# Patient Record
Sex: Female | Born: 1965 | Race: White | Hispanic: No | Marital: Single | Smoking: Former smoker
Health system: Southern US, Community
[De-identification: ages and names within clinical notes are randomized; demographics above are authoritative.]

## PROBLEM LIST (undated history)

## (undated) DIAGNOSIS — K603 Anal fistula, unspecified: Secondary | ICD-10-CM

## (undated) DIAGNOSIS — N200 Calculus of kidney: Secondary | ICD-10-CM

## (undated) DIAGNOSIS — F32A Depression, unspecified: Secondary | ICD-10-CM

## (undated) DIAGNOSIS — S0300XA Dislocation of jaw, unspecified side, initial encounter: Secondary | ICD-10-CM

## (undated) DIAGNOSIS — F329 Major depressive disorder, single episode, unspecified: Secondary | ICD-10-CM

## (undated) DIAGNOSIS — R760 Raised antibody titer: Secondary | ICD-10-CM

## (undated) DIAGNOSIS — M542 Cervicalgia: Secondary | ICD-10-CM

## (undated) DIAGNOSIS — G43909 Migraine, unspecified, not intractable, without status migrainosus: Secondary | ICD-10-CM

## (undated) DIAGNOSIS — I10 Essential (primary) hypertension: Secondary | ICD-10-CM

## (undated) DIAGNOSIS — Z973 Presence of spectacles and contact lenses: Secondary | ICD-10-CM

## (undated) DIAGNOSIS — Z87442 Personal history of urinary calculi: Secondary | ICD-10-CM

## (undated) DIAGNOSIS — G4733 Obstructive sleep apnea (adult) (pediatric): Secondary | ICD-10-CM

## (undated) DIAGNOSIS — I2510 Atherosclerotic heart disease of native coronary artery without angina pectoris: Secondary | ICD-10-CM

## (undated) DIAGNOSIS — D649 Anemia, unspecified: Secondary | ICD-10-CM

## (undated) DIAGNOSIS — E785 Hyperlipidemia, unspecified: Secondary | ICD-10-CM

## (undated) DIAGNOSIS — Z8619 Personal history of other infectious and parasitic diseases: Secondary | ICD-10-CM

## (undated) DIAGNOSIS — G4730 Sleep apnea, unspecified: Secondary | ICD-10-CM

## (undated) DIAGNOSIS — B009 Herpesviral infection, unspecified: Secondary | ICD-10-CM

## (undated) HISTORY — DX: Raised antibody titer: R76.0

## (undated) HISTORY — DX: Migraine, unspecified, not intractable, without status migrainosus: G43.909

## (undated) HISTORY — DX: Essential (primary) hypertension: I10

## (undated) HISTORY — DX: Herpesviral infection, unspecified: B00.9

## (undated) HISTORY — DX: Sleep apnea, unspecified: G47.30

## (undated) HISTORY — DX: Dislocation of jaw, unspecified side, initial encounter: S03.00XA

## (undated) HISTORY — PX: SKIN CANCER DESTRUCTION: SHX778

## (undated) HISTORY — DX: Hyperlipidemia, unspecified: E78.5

## (undated) HISTORY — PX: INTRAUTERINE DEVICE (IUD) INSERTION: SHX5877

## (undated) HISTORY — DX: Atherosclerotic heart disease of native coronary artery without angina pectoris: I25.10

## (undated) HISTORY — PX: ANTERIOR CERVICAL DECOMP/DISCECTOMY FUSION: SHX1161

---
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
## 1998-01-06 ENCOUNTER — Other Ambulatory Visit: Admission: RE | Admit: 1998-01-06 | Discharge: 1998-01-06 | Payer: Self-pay | Admitting: Obstetrics and Gynecology

## 1998-02-14 ENCOUNTER — Inpatient Hospital Stay (HOSPITAL_COMMUNITY): Admission: EM | Admit: 1998-02-14 | Discharge: 1998-02-15 | Payer: Self-pay | Admitting: Emergency Medicine

## 1998-04-10 ENCOUNTER — Other Ambulatory Visit: Admission: RE | Admit: 1998-04-10 | Discharge: 1998-04-10 | Payer: Self-pay | Admitting: Gynecology

## 1998-06-17 ENCOUNTER — Encounter: Admission: RE | Admit: 1998-06-17 | Discharge: 1998-09-15 | Payer: Self-pay | Admitting: Gynecology

## 1998-07-21 ENCOUNTER — Encounter: Payer: Self-pay | Admitting: *Deleted

## 1998-07-21 ENCOUNTER — Ambulatory Visit (HOSPITAL_COMMUNITY): Admission: RE | Admit: 1998-07-21 | Discharge: 1998-07-21 | Payer: Self-pay | Admitting: Obstetrics and Gynecology

## 1998-12-01 ENCOUNTER — Inpatient Hospital Stay (HOSPITAL_COMMUNITY): Admission: AD | Admit: 1998-12-01 | Discharge: 1998-12-05 | Payer: Self-pay | Admitting: Gynecology

## 1998-12-21 ENCOUNTER — Encounter (HOSPITAL_COMMUNITY): Admission: RE | Admit: 1998-12-21 | Discharge: 1999-03-21 | Payer: Self-pay | Admitting: Gynecology

## 1999-01-14 ENCOUNTER — Other Ambulatory Visit: Admission: RE | Admit: 1999-01-14 | Discharge: 1999-01-14 | Payer: Self-pay | Admitting: Gynecology

## 2000-06-05 ENCOUNTER — Other Ambulatory Visit: Admission: RE | Admit: 2000-06-05 | Discharge: 2000-06-05 | Payer: Self-pay | Admitting: Gynecology

## 2000-07-11 ENCOUNTER — Other Ambulatory Visit: Admission: RE | Admit: 2000-07-11 | Discharge: 2000-07-11 | Payer: Self-pay | Admitting: Gynecology

## 2000-07-11 ENCOUNTER — Encounter (INDEPENDENT_AMBULATORY_CARE_PROVIDER_SITE_OTHER): Payer: Self-pay | Admitting: Specialist

## 2001-06-12 ENCOUNTER — Other Ambulatory Visit: Admission: RE | Admit: 2001-06-12 | Discharge: 2001-06-12 | Payer: Self-pay | Admitting: Gynecology

## 2003-02-05 ENCOUNTER — Encounter: Payer: Self-pay | Admitting: Gastroenterology

## 2003-02-05 ENCOUNTER — Encounter: Admission: RE | Admit: 2003-02-05 | Discharge: 2003-02-05 | Payer: Self-pay | Admitting: Gastroenterology

## 2003-03-06 ENCOUNTER — Encounter: Admission: RE | Admit: 2003-03-06 | Discharge: 2003-03-06 | Payer: Self-pay | Admitting: Gastroenterology

## 2003-03-06 ENCOUNTER — Encounter: Payer: Self-pay | Admitting: Gastroenterology

## 2003-05-08 ENCOUNTER — Other Ambulatory Visit: Admission: RE | Admit: 2003-05-08 | Discharge: 2003-05-08 | Payer: Self-pay | Admitting: Gynecology

## 2004-05-14 ENCOUNTER — Other Ambulatory Visit: Admission: RE | Admit: 2004-05-14 | Discharge: 2004-05-14 | Payer: Self-pay | Admitting: Gynecology

## 2004-09-25 ENCOUNTER — Emergency Department (HOSPITAL_COMMUNITY): Admission: EM | Admit: 2004-09-25 | Discharge: 2004-09-26 | Payer: Self-pay | Admitting: Emergency Medicine

## 2004-11-30 ENCOUNTER — Inpatient Hospital Stay (HOSPITAL_COMMUNITY): Admission: RE | Admit: 2004-11-30 | Discharge: 2004-12-03 | Payer: Self-pay | Admitting: Gynecology

## 2004-11-30 ENCOUNTER — Encounter (INDEPENDENT_AMBULATORY_CARE_PROVIDER_SITE_OTHER): Payer: Self-pay | Admitting: Specialist

## 2004-12-08 ENCOUNTER — Inpatient Hospital Stay (HOSPITAL_COMMUNITY): Admission: AD | Admit: 2004-12-08 | Discharge: 2004-12-08 | Payer: Self-pay | Admitting: Gynecology

## 2004-12-14 ENCOUNTER — Encounter: Admission: RE | Admit: 2004-12-14 | Discharge: 2005-01-13 | Payer: Self-pay | Admitting: Gynecology

## 2005-01-07 ENCOUNTER — Other Ambulatory Visit: Admission: RE | Admit: 2005-01-07 | Discharge: 2005-01-07 | Payer: Self-pay | Admitting: Gynecology

## 2006-01-28 ENCOUNTER — Emergency Department (HOSPITAL_COMMUNITY): Admission: EM | Admit: 2006-01-28 | Discharge: 2006-01-28 | Payer: Self-pay | Admitting: Family Medicine

## 2006-02-02 ENCOUNTER — Ambulatory Visit: Payer: Self-pay | Admitting: Family Medicine

## 2006-03-03 ENCOUNTER — Ambulatory Visit: Payer: Self-pay | Admitting: Family Medicine

## 2006-03-10 ENCOUNTER — Ambulatory Visit: Payer: Self-pay | Admitting: Family Medicine

## 2006-03-10 ENCOUNTER — Other Ambulatory Visit: Admission: RE | Admit: 2006-03-10 | Discharge: 2006-03-10 | Payer: Self-pay | Admitting: Family Medicine

## 2006-05-05 ENCOUNTER — Ambulatory Visit: Payer: Self-pay | Admitting: Family Medicine

## 2006-05-08 ENCOUNTER — Ambulatory Visit: Payer: Self-pay | Admitting: Family Medicine

## 2006-07-03 ENCOUNTER — Ambulatory Visit: Payer: Self-pay | Admitting: Family Medicine

## 2006-08-23 ENCOUNTER — Ambulatory Visit: Payer: Self-pay | Admitting: Family Medicine

## 2006-10-12 ENCOUNTER — Ambulatory Visit: Payer: Self-pay | Admitting: Family Medicine

## 2006-10-26 ENCOUNTER — Ambulatory Visit: Payer: Self-pay | Admitting: Family Medicine

## 2006-12-19 ENCOUNTER — Ambulatory Visit: Payer: Self-pay | Admitting: Family Medicine

## 2006-12-25 ENCOUNTER — Emergency Department (HOSPITAL_COMMUNITY): Admission: EM | Admit: 2006-12-25 | Discharge: 2006-12-25 | Payer: Self-pay | Admitting: Emergency Medicine

## 2007-01-01 ENCOUNTER — Ambulatory Visit: Payer: Self-pay | Admitting: Family Medicine

## 2007-03-01 ENCOUNTER — Ambulatory Visit: Payer: Self-pay | Admitting: Family Medicine

## 2007-03-28 ENCOUNTER — Ambulatory Visit: Payer: Self-pay | Admitting: Family Medicine

## 2007-03-28 ENCOUNTER — Other Ambulatory Visit: Admission: RE | Admit: 2007-03-28 | Discharge: 2007-03-28 | Payer: Self-pay | Admitting: Family Medicine

## 2007-04-12 ENCOUNTER — Ambulatory Visit: Payer: Self-pay | Admitting: Family Medicine

## 2007-06-05 ENCOUNTER — Ambulatory Visit: Payer: Self-pay | Admitting: Family Medicine

## 2007-07-30 ENCOUNTER — Ambulatory Visit: Payer: Self-pay | Admitting: Family Medicine

## 2007-08-20 ENCOUNTER — Ambulatory Visit: Payer: Self-pay | Admitting: Family Medicine

## 2007-09-17 ENCOUNTER — Ambulatory Visit: Payer: Self-pay | Admitting: Family Medicine

## 2007-09-28 ENCOUNTER — Ambulatory Visit: Payer: Self-pay | Admitting: Family Medicine

## 2007-11-20 ENCOUNTER — Ambulatory Visit: Payer: Self-pay | Admitting: Family Medicine

## 2008-01-01 ENCOUNTER — Encounter: Payer: Self-pay | Admitting: Endocrinology

## 2008-01-01 ENCOUNTER — Ambulatory Visit: Payer: Self-pay | Admitting: Family Medicine

## 2008-01-07 ENCOUNTER — Encounter: Payer: Self-pay | Admitting: Endocrinology

## 2008-01-07 ENCOUNTER — Encounter: Admission: RE | Admit: 2008-01-07 | Discharge: 2008-01-07 | Payer: Self-pay | Admitting: Family Medicine

## 2008-01-29 ENCOUNTER — Encounter: Admission: RE | Admit: 2008-01-29 | Discharge: 2008-01-29 | Payer: Self-pay | Admitting: Gastroenterology

## 2008-02-05 ENCOUNTER — Ambulatory Visit: Payer: Self-pay | Admitting: Endocrinology

## 2008-02-05 DIAGNOSIS — K219 Gastro-esophageal reflux disease without esophagitis: Secondary | ICD-10-CM | POA: Insufficient documentation

## 2008-02-05 DIAGNOSIS — E041 Nontoxic single thyroid nodule: Secondary | ICD-10-CM | POA: Insufficient documentation

## 2008-02-05 DIAGNOSIS — M542 Cervicalgia: Secondary | ICD-10-CM | POA: Insufficient documentation

## 2008-02-14 ENCOUNTER — Ambulatory Visit: Payer: Self-pay | Admitting: Family Medicine

## 2008-04-09 ENCOUNTER — Ambulatory Visit: Payer: Self-pay | Admitting: Family Medicine

## 2008-07-03 ENCOUNTER — Ambulatory Visit: Payer: Self-pay | Admitting: Family Medicine

## 2008-07-03 ENCOUNTER — Other Ambulatory Visit: Admission: RE | Admit: 2008-07-03 | Discharge: 2008-07-03 | Payer: Self-pay | Admitting: Family Medicine

## 2009-05-03 ENCOUNTER — Emergency Department: Payer: Self-pay | Admitting: Emergency Medicine

## 2009-05-27 ENCOUNTER — Ambulatory Visit: Payer: Self-pay | Admitting: Family Medicine

## 2009-06-09 ENCOUNTER — Encounter: Admission: RE | Admit: 2009-06-09 | Discharge: 2009-08-03 | Payer: Self-pay | Admitting: Physician Assistant

## 2009-07-09 ENCOUNTER — Ambulatory Visit: Payer: Self-pay | Admitting: Family Medicine

## 2009-07-13 ENCOUNTER — Encounter: Admission: RE | Admit: 2009-07-13 | Discharge: 2009-07-13 | Payer: Self-pay | Admitting: Orthopedic Surgery

## 2009-08-05 ENCOUNTER — Encounter: Admission: RE | Admit: 2009-08-05 | Discharge: 2009-08-21 | Payer: Self-pay | Admitting: Physician Assistant

## 2009-08-27 ENCOUNTER — Encounter: Admission: RE | Admit: 2009-08-27 | Discharge: 2009-11-25 | Payer: Self-pay | Admitting: Physician Assistant

## 2009-10-02 ENCOUNTER — Ambulatory Visit: Payer: Self-pay | Admitting: Family Medicine

## 2009-10-11 ENCOUNTER — Emergency Department (HOSPITAL_COMMUNITY): Admission: EM | Admit: 2009-10-11 | Discharge: 2009-10-11 | Payer: Self-pay | Admitting: Family Medicine

## 2009-11-10 ENCOUNTER — Ambulatory Visit: Payer: Self-pay | Admitting: Gynecology

## 2009-11-10 ENCOUNTER — Other Ambulatory Visit: Admission: RE | Admit: 2009-11-10 | Discharge: 2009-11-10 | Payer: Self-pay | Admitting: Gynecology

## 2009-11-24 ENCOUNTER — Ambulatory Visit: Payer: Self-pay | Admitting: Gynecology

## 2010-05-07 ENCOUNTER — Emergency Department (HOSPITAL_COMMUNITY): Admission: EM | Admit: 2010-05-07 | Discharge: 2010-05-07 | Payer: Self-pay | Admitting: Emergency Medicine

## 2010-07-19 ENCOUNTER — Ambulatory Visit: Payer: Self-pay | Admitting: Family Medicine

## 2010-08-06 ENCOUNTER — Ambulatory Visit: Payer: Self-pay | Admitting: Family Medicine

## 2010-08-20 ENCOUNTER — Ambulatory Visit
Admission: RE | Admit: 2010-08-20 | Discharge: 2010-08-20 | Payer: Self-pay | Source: Home / Self Care | Attending: Family Medicine | Admitting: Family Medicine

## 2010-09-02 ENCOUNTER — Ambulatory Visit
Admission: RE | Admit: 2010-09-02 | Discharge: 2010-09-02 | Payer: Self-pay | Source: Home / Self Care | Attending: Family Medicine | Admitting: Family Medicine

## 2010-09-23 NOTE — Assessment & Plan Note (Signed)
Summary: NEW ENDO CON/ THYROID/[place] ACCESS/DR LALONDE/$50/NWS   Vital Signs:  Patient Profile:   45 Years Old Female Weight:      183.0 pounds Temp:     97.6 degrees F oral Pulse rate:   102 / minute BP sitting:   110 / 76  (right arm) Cuff size:   regular  Pt. in pain?   no  Vitals Entered By: Orlan Leavens (February 05, 2008 1:18 PM)                  Referred by:  Sharlot Gowda PCP:  Sharlot Gowda  Chief Complaint:  NEW ENDO/ REFERRED BY DR Susann Givens.  History of Present Illness: pt states 2 yearsw of increasing dysphagia, worse with solids.  no associated sxs.  Barium swallow was negative except for GERD    Current Allergies: ! SEPTRA ! * IMITEX ! MORPHINE  Past Medical History:    Reviewed history and no changes required:       NECK PAIN (ICD-723.1)       GERD (ICD-530.81)       THYROID NODULE, RIGHT (ICD-241.0)          Family History:    Reviewed history and no changes required:       His father takes an uncertain type of thyroid medication  Social History:    Reviewed history and no changes required:       divorced       works at medical office   Risk Factors:  Tobacco use:  current   Review of Systems  The patient denies hematochezia, unusual weight change, dyspnea on exertion, fever, vision loss, decreased hearing, chest pain, and peripheral edema.         slight cough present.  Has a slight amount of neck pain, but she says this is at the back of the neck.     Physical Exam  General:     obese.   Eyes:     no proptosis Neck:     no masses, thyromegaly, or abnormal cervical nodes Lungs:     clear to auscultation.  no respiratory distress  Heart:     regular rate and rhythm, S1, S2 without murmurs, rubs, gallops, or clicks Skin:     not diaphoretic Cervical Nodes:     no significant adenopathy Psych:     alert and cooperative; normal mood and affect; normal attention span and concentration Additional Exam:     Outside test results  are reviewed. On 01/01/08:  thyroid function studies, including TSH were normal  on 01/07/08: thyroid ultrasound shows thyroid is borderline enlarged, and has only a 4 mm right lower pole nodule.    Impression & Recommendations:  Problem # 1:  THYROID NODULE, RIGHT (ICD-241.0)  Orders: Consultation Level III (54098)   Problem # 2:  NECK PAIN (ICD-723.1) not thyroid-related  Problem # 3:  GERD (ICD-530.81)  Medications Added to Medication List This Visit: 1)  Lexapro 10 Mg Tabs (Escitalopram oxalate) .... Take 1 by mouth qd 2)  Kapidex 30 Mg Cpdr (Dexlansoprazole) .... Take 1 by mouth qd 3)  Multivitamins Tabs (Multiple vitamin) .... Take 1 by mouth qd   Patient Instructions: 1)  we discussed the differential diagnosis, risks, and rx options of hyperthyroidism 2)  I advised a recheck of the ultrasound and another TSH in the year. I've advised her it would be reasonable to do this sooner if she felt she had some change in the structure or  function of the thyroid between now and then. 3)  I told her it's fine with me to have this done with [facility] G[facility] and save her a trip back here so she can return here p.r.n. 4)  cc kim [place], [place]   ]

## 2010-10-26 ENCOUNTER — Ambulatory Visit: Payer: Self-pay | Admitting: Family Medicine

## 2010-11-05 ENCOUNTER — Ambulatory Visit (INDEPENDENT_AMBULATORY_CARE_PROVIDER_SITE_OTHER): Payer: Self-pay | Admitting: Family Medicine

## 2010-11-05 DIAGNOSIS — F411 Generalized anxiety disorder: Secondary | ICD-10-CM

## 2010-11-16 ENCOUNTER — Encounter: Payer: Self-pay | Admitting: Gynecology

## 2010-12-08 ENCOUNTER — Inpatient Hospital Stay (INDEPENDENT_AMBULATORY_CARE_PROVIDER_SITE_OTHER)
Admission: RE | Admit: 2010-12-08 | Discharge: 2010-12-08 | Disposition: A | Discharge status: Home / Self Care | Payer: 59 | Source: Ambulatory Visit | Attending: Family Medicine | Admitting: Family Medicine

## 2010-12-08 DIAGNOSIS — J40 Bronchitis, not specified as acute or chronic: Secondary | ICD-10-CM

## 2010-12-08 DIAGNOSIS — J019 Acute sinusitis, unspecified: Secondary | ICD-10-CM

## 2010-12-14 ENCOUNTER — Ambulatory Visit (INDEPENDENT_AMBULATORY_CARE_PROVIDER_SITE_OTHER): Payer: 59 | Admitting: *Deleted

## 2010-12-14 DIAGNOSIS — I10 Essential (primary) hypertension: Secondary | ICD-10-CM

## 2010-12-14 DIAGNOSIS — F430 Acute stress reaction: Secondary | ICD-10-CM

## 2011-01-07 NOTE — Discharge Summary (Signed)
NAME:  BRANDII, LAKEY                 ACCOUNT NO.:  [id number]   MEDICAL RECORD NO.:  [id number]          PATIENT TYPE:  INP   LOCATION:  9135                          [facility]   PHYSICIAN:  Juan H. Lily Peer, M.D.DATE OF BIRTH:  March 26, 1966   DATE OF ADMISSION:  11/30/2004  DATE OF DISCHARGE:  12/03/2004                                 DISCHARGE SUMMARY   Total days hospitalized:  3.   HISTORY:  The patient is a 45 year old gravida 4 para 2 AB 1 with previous  cesarean section requesting elective repeat. The patient underwent elective  repeat cesarean section. The patient with known history of antiphospholipid  antibody, had been on baby aspirin. The patient delivered a viable female  infant, Apgars of 9 and 9, with a weight of 8 pounds 4 ounces.  Postoperatively, the patient had hemoglobin on postoperative day #1 of 9.8  and hematocrit 27.4; platelet count 110,000. She is A positive, rubella  immune. After 24 hours, her Foley catheter was discontinued, she was doing  well, she was advanced from a clear liquid diet to a regular diet. On her  postoperative day #3 she was ready to be discharged home. Her staples were  removed and her incision was Steri-Stripped.   FINAL DIAGNOSES:  1.  Term intrauterine pregnancy.  2.  Previous cesarean section for elective repeat.  3.  Antiphospholipid syndrome.   PROCEDURES PERFORMED:  Repeat lower uterine segment transverse cesarean  section.   FINAL DISPOSITION AND FOLLOW-UP:  The patient was discharged home on her  postoperative day #3. She was ambulating, voiding well, and tolerating a  regular diet well. Her incision site was intact. Her staples were removed,  incision was Steri-Stripped. She was given discharge instructions and was  given a prescription of Lortab 7.5/500 to take one p.o. q.4-6h. p.r.n. pain,  and a prescription for Motrin 800 mg t.i.d. She was instructed to continue  her prenatal vitamins and iron, and to follow up in  the office in 6 weeks  for a postoperative visit at [facility].      JHF/MEDQ  D:  12/31/2004  T:  12/31/2004  Job:  [id number]

## 2011-01-07 NOTE — H&P (Signed)
NAME:  Kelly [name], Kelly [name]                 ACCOUNT NO.:  [id number]   MEDICAL RECORD NO.:  [id number]          PATIENT TYPE:  INP   LOCATION:  NA                            [facility]   PHYSICIAN:  Juan H. Lily Peer, M.D.DATE OF BIRTH:  12/05/1965   DATE OF ADMISSION:  11/30/2004  DATE OF DISCHARGE:                                HISTORY & PHYSICAL   CHIEF COMPLAINT:  1.  Term intrauterine pregnancy.  2.  Previous cesarean section requesting elective repeat.  3.  History of antiphospholipid antibody syndrome.   HISTORY OF PRESENT ILLNESS:  The patient is a 45 year old gravida 4, para 2,  abortus 1, corrected estimated date of confinement of December 10, 2004.  The  patient will be 38 to [redacted] weeks gestation at the time of her requested  elective repeat cesarean section.  The patient was to be discussing with her  husband and let us know before the cesarean section if she wanted to proceed  with bilateral tubal sterilization procedure.  Due to the fact that the  patient had been diagnosed with antiphospholipid antibody syndrome, she was  placed on baby aspirin 81 mg daily.  She had been offered genetic  amniocentesis secondary to advanced maternal age and she declined and she  also declined cystic fibrosis screening.  She had upper respiratory tract  infection during her pregnancy and approximately 32 to [redacted] weeks gestation,  she was started to be followed twice a week with nonstress test and AFI's  and had reassuring antepartum testing.  The patient also had suffered from  depression during her pregnancy due to a stressful relationship and had been  put on Zoloft 50 mg daily and had been referred to a psychologist here in  [place].   ALLERGIES:  SEPTRA, MORPHINE.   PAST MEDICAL HISTORY:  She had a spontaneous abortion in 78.  She had a  cesarean section in 1993.  Her repeat cesarean section in 2000.  She has a  history of depression currently on prenatal vitamins, iron, and Zoloft 50  mg  daily.   REVIEW OF SYSTEMS:  See [place] form.   PHYSICAL EXAMINATION:  VITAL SIGNS:  Blood pressure 120/64.  Urine; trace  protein, 8 glucose.  Weight 211 pounds.  HEENT:  Unremarkable.  NECK:  Supple.  Trachea midline.  No carotid bruits, no thyromegaly.  LUNGS:  Clear to auscultation without rhonchi or wheezes.  HEART:  Regular rate and rhythm with no murmurs or gallops.  BREASTS:  Not done.  ABDOMEN:  Gravid uterus, vertex presentation by [employer].  PELVIC:  Cervix was long, closed, and posterior.  EXTREMITIES:  DTR 1+, negative clonus.   PRENATAL LABORATORY DATA:  A positive blood type, negative antibody screen,  rubella immune, hepatitis B surface antigen and HIV were negative.  Pap  smear was normal.  Declined cystic fibrosis screen.  Declined genetic  amniocentesis.  Iron deficiency anemia.  Negative GBS culture.   ASSESSMENT:  A 45 year old gravida 4, para 2, abortus 1 at 24 to [redacted] weeks  gestation with previous cesarean section requesting elective repeat,  the  patient will decide if she wants to proceed with elective permanent  sterilization so we may carry it out at the time of the cesarean section.  The risks, benefits, and pros, and cons of the operation were discussed  including infection, bleeding, trauma to internal organs, risks for deep  venous thrombosis, pulmonary embolism, and even death.  In the event of  uncontrollable hemorrhage, she could potentially undergo an emergency  hysterectomy.  Also for a tubal sterilization, she is fully aware that she  will not be able to have anymore children and this is a permanent procedure.  All of these questions were discussed and will follow accordingly.   PLAN:  The patient is scheduled for a repeat cesarean section on Tuesday,  April 11, with possible tubal sterilization procedure depending on the  patient's decision at the time before her cesarean section when we will  rediscuss.      JHF/MEDQ  D:   11/29/2004  T:  11/30/2004  Job:  [id number]

## 2011-01-07 NOTE — Op Note (Signed)
NAME:  Kelly [name], Kelly [name] NO.:  [id number]   MEDICAL RECORD NO.:  [id number]          PATIENT TYPE:  INP   LOCATION:  9198                          [facility]ILITY[facility]   PHYSICIAN:  Juan H. Lily Peer, M.D.DATE OF BIRTH:  Oct 12, 1965   DATE OF PROCEDURE:  11/30/2004  DATE OF DISCHARGE:                                 OPERATIVE REPORT   PREOPERATIVE DIAGNOSES:  1.  Term intrauterine pregnancy.  2.  Previous cesarean section, for elective repeat.  3.  Antiphospholipid syndrome.   POSTOPERATIVE DIAGNOSES:  1.  Term intrauterine pregnancy.  2.  Previous cesarean section, for elective repeat.  3.  Antiphospholipid syndrome.   ANESTHESIA:  Spinal.   PROCEDURE PERFORMED:  Repeat lower segment transverse cesarean section.   SURGEON:  Juan H. Lily Peer, M.D.   FIRST ASSISTANT:  Timothy P. Fontaine, M.D.   INDICATION FOR OPERATION:  A 45 year old gravida 4, para 2, AB 1, with  previous cesarean section, requesting elective repeat.  Patient with history  of antiphospholipid antibody syndrome and currently 38-39 weeks' gestation.   FINDINGS:  1.  Pelvic adhesions.  2.  Normal tubes and ovaries.  3.  Clear amniotic fluid.  4.  Nuchal cord x1.  5.  Viable female infant, Apgars of 9 and 9, with a weight of 8 pounds 4      ounces.   DESCRIPTION OF OPERATION:  After the patient was adequately counseled, she  underwent a successful spinal anesthesia placement.  Her abdomen was prepped  and draped in the usual sterile fashion.  A Foley catheter had been inserted  in an effort to monitor urinary output.  A Pfannenstiel skin incision was  made and the incision was carried down through the skin and subcutaneous  tissue down to the rectus fascia, whereby a midline nick was made.  The  fascia was incised in transverse fashion.  The peritoneal cavity was entered  cautiously.  The bladder flap was established.  The lower uterine segment  was incised in a transverse fashion.  Clear  amniotic fluid was present.  The  newborn's head was delivered after manually reducing the nuchal cord.  The  rest of the newborn was delivered.  The cord was doubly clamped and excised.  The newborn gave an immediate cry, was passed off to the pediatricians who  were in attendance, who gave the above-mentioned parameters.  After the cord  blood was obtained, the placenta was delivered from the intrauterine cavity  and submitted for pathologic evaluation.  The intrauterine cavity was swept  clear of remaining products of conception.  The uterus was not exteriorized.  The transverse uterine incision was closed in a single locking stitch with 0  Vicryl first, followed by a second layer of 0 Vicryl suture in an  imbricating fashion.  The pelvic cavity was copiously irrigated with normal  saline solution.  After ascertaining adequate hemostasis, closure was  started.  The pyramidalis muscle was reapproximated with a running stitch of  3-0 Vicryl suture.  Then the rectus fascia was closed with a running stitch  of 0 Vicryl  suture.  The subcutaneous bleeders were Bovie cauterized.  The  skin was reapproximated with skin clips, followed by placement of Xeroform  gauze and 4 x 8 dressing.  The patient was transferred to the recovery room  with stable vital signs.  Blood loss for the procedure was recorded at 750  mL.  IV fluids 2700 mL of lactated Ringer's.  Urine output was 175 mL, and  she received 1 g of Cefotan for prophylaxis.      JHF/MEDQ  D:  11/30/2004  T:  11/30/2004  Job:  [id number]

## 2011-01-18 ENCOUNTER — Ambulatory Visit (INDEPENDENT_AMBULATORY_CARE_PROVIDER_SITE_OTHER): Payer: 59 | Admitting: Gynecology

## 2011-01-18 DIAGNOSIS — IMO0002 Reserved for concepts with insufficient information to code with codable children: Secondary | ICD-10-CM

## 2011-01-18 DIAGNOSIS — Z113 Encounter for screening for infections with a predominantly sexual mode of transmission: Secondary | ICD-10-CM

## 2011-01-18 DIAGNOSIS — N898 Other specified noninflammatory disorders of vagina: Secondary | ICD-10-CM

## 2011-01-18 DIAGNOSIS — B373 Candidiasis of vulva and vagina: Secondary | ICD-10-CM

## 2011-01-18 DIAGNOSIS — B3731 Acute candidiasis of vulva and vagina: Secondary | ICD-10-CM

## 2011-01-18 DIAGNOSIS — Z8619 Personal history of other infectious and parasitic diseases: Secondary | ICD-10-CM | POA: Insufficient documentation

## 2011-04-22 ENCOUNTER — Ambulatory Visit (INDEPENDENT_AMBULATORY_CARE_PROVIDER_SITE_OTHER): Payer: 59 | Admitting: Medical

## 2011-04-22 ENCOUNTER — Encounter: Payer: Self-pay | Admitting: Medical

## 2011-04-22 VITALS — BP 110/82 | HR 88 | Temp 98.6°F | Resp 20

## 2011-04-22 DIAGNOSIS — J329 Chronic sinusitis, unspecified: Secondary | ICD-10-CM

## 2011-04-22 MED ORDER — AMOXICILLIN 875 MG PO TABS: 875.0000 mg | ORAL_TABLET | Freq: Two times a day (BID) | ORAL | Status: AC

## 2011-04-22 NOTE — Progress Notes (Signed)
Subjective:     Kelly [name] is a 45 y.o. female who presents for evaluation of sinus pain. Symptoms include: congestion, cough, headaches, sinus pressure, sore throat and hoarse voice. Onset of symptoms was 3 days ago. Symptoms have been gradually worsening since that time. Past history is significant for sinus infection. Patient is a smoker.  Using OTC Clarinex and Dayquil for symptoms.  Daughter is sick contact.  No other aggravating or relieving factors.  No other c/o.  The following portions of the patient's history were reviewed and updated as appropriate: allergies, current medications, past family history, past medical history, past social history, past surgical history and problem list.  Past Medical History  Diagnosis Date  . Hypertension   . Hyperlipidemia     Review of Systems Constitutional: +chills; denies fever, sweats, anorexia Skin: denies rash HEENT: denies ear pain,  itchy watery eyes Cardiovascular: denies chest pain Lungs: denies wheezing Abdomen: denies abdominal pain, nausea, vomiting, diarrhea GU: denies dysuria  Objective:   Filed Vitals:   04/22/11 1525  BP: 110/82  Pulse: 88  Temp: 98.6 F (37 C)  Resp: 20    General appearance: Alert, WD/WN, no distress, ill appearing                             Skin: warm, no rash                           Head: + right frontal and left maxillary sinus tenderness,                            Eyes: conjunctiva normal, corneas clear, PERRLA                            Ears: pearly TMs, external ear canals normal                          Nose: septum midline, turbinates swollen, with erythema and clear discharge             Mouth/throat: MMM, tongue normal, mild pharyngeal erythema                           Neck: supple, no adenopathy, no thyromegaly, nontender                          Heart: RRR, normal S1, S2, no murmurs                         Lungs: CTA bilaterally, no wheezes, rales, or rhonchi        Assessment:     Encounter Diagnosis  Name Primary?  . Sinusitis Yes     Plan:   Prescription given for Amoxicillin.  Can use OTC Ibuprofen for congestion.  Tylenol or Ibuprofen OTC for fever and malaise.  Discussed symptomatic relief, nasal saline, and call or return if worse or not improving in 2-3 days.

## 2011-05-06 ENCOUNTER — Other Ambulatory Visit: Payer: Self-pay | Admitting: Medical

## 2011-05-06 MED ORDER — HYDROCODONE-HOMATROPINE 5-1.5 MG/5ML PO SYRP: 5.0000 mL | ORAL_SOLUTION | Freq: Every day | ORAL | Status: AC

## 2011-05-25 ENCOUNTER — Ambulatory Visit (INDEPENDENT_AMBULATORY_CARE_PROVIDER_SITE_OTHER): Payer: 59

## 2011-05-25 ENCOUNTER — Encounter: Payer: Self-pay | Admitting: Women's Health

## 2011-05-25 ENCOUNTER — Other Ambulatory Visit: Payer: Self-pay | Admitting: Women's Health

## 2011-05-25 ENCOUNTER — Ambulatory Visit (INDEPENDENT_AMBULATORY_CARE_PROVIDER_SITE_OTHER): Payer: 59 | Admitting: Women's Health

## 2011-05-25 VITALS — BP 128/82

## 2011-05-25 DIAGNOSIS — N979 Female infertility, unspecified: Secondary | ICD-10-CM

## 2011-05-25 DIAGNOSIS — N949 Unspecified condition associated with female genital organs and menstrual cycle: Secondary | ICD-10-CM

## 2011-05-25 DIAGNOSIS — Z30431 Encounter for routine checking of intrauterine contraceptive device: Secondary | ICD-10-CM

## 2011-05-25 DIAGNOSIS — R1031 Right lower quadrant pain: Secondary | ICD-10-CM

## 2011-05-25 DIAGNOSIS — T8339XA Other mechanical complication of intrauterine contraceptive device, initial encounter: Secondary | ICD-10-CM

## 2011-05-25 DIAGNOSIS — E785 Hyperlipidemia, unspecified: Secondary | ICD-10-CM

## 2011-05-25 DIAGNOSIS — R760 Raised antibody titer: Secondary | ICD-10-CM | POA: Insufficient documentation

## 2011-05-25 DIAGNOSIS — I10 Essential (primary) hypertension: Secondary | ICD-10-CM

## 2011-05-25 MED ORDER — IBUPROFEN 600 MG PO TABS: 600.0000 mg | ORAL_TABLET | Freq: Three times a day (TID) | ORAL | Status: AC | PRN

## 2011-05-25 NOTE — Progress Notes (Signed)
  Presents with lower right lower quadrant pain for about 3 days, she states the pain was greater yesterday and today. She appears well. She is having regular monthly cycle that she states is more regular and heavier than it had been in the past  with first Mirena . She had Mirena  IUD that was removed and new one placed in April of 2011. Husband has vasectomy. Denies discharge, fever, or any urinary symptoms.  Exam: Abdomen is soft she does have slight tenderness in the right lower quadrant but no rebound or radiation. External genitalia is within normal limits speculum exam scant discharge no erythema or odor was noted. IUD strings visible. Bimanual no CMT, she does have slight tenderness on the right lower quadrant.  Ultrasound: IUD seen in the cervix fluid-filled endometrial cavity right ovary was normal left ovary normal no apparent masses were seen in right or left adnexal.  Plan: IUD removed intact, Motrin as needed for pain, if pain continues or changes to return to the office. Reviewed this may be what is causing her discomfort.(Did not return for IUD check after placement.)

## 2011-06-29 ENCOUNTER — Encounter: Payer: Self-pay | Admitting: Medical

## 2011-06-29 ENCOUNTER — Ambulatory Visit (INDEPENDENT_AMBULATORY_CARE_PROVIDER_SITE_OTHER): Payer: 59 | Admitting: Medical

## 2011-06-29 VITALS — BP 110/70 | HR 72 | Temp 98.7°F | Resp 16

## 2011-06-29 DIAGNOSIS — H9209 Otalgia, unspecified ear: Secondary | ICD-10-CM

## 2011-06-29 DIAGNOSIS — L299 Pruritus, unspecified: Secondary | ICD-10-CM

## 2011-06-29 MED ORDER — HYDROXYZINE HCL 25 MG PO TABS: 25.0000 mg | ORAL_TABLET | Freq: Three times a day (TID) | ORAL | Status: AC | PRN

## 2011-06-29 NOTE — Patient Instructions (Signed)
Begin OTC Zantac twice daily for the next few days.  I sent Hydroxyzine 25 mg to the pharmacy.  You can use this, 1-2 tablets 2-3 times daily for the next few days.  Use luke warm showers, avoid the new detergent, you can use ice pack and or hydrocortisone cream topically for worse areas.    If not improving in 2-3 days, we can add steroid.

## 2011-06-29 NOTE — Progress Notes (Signed)
Subjective:   HPI  Kelly [name] is a 45 y.o. female who presents with 5 day hx/o itching all over.  She notes the only new change was detergent she bought from [employer].  She has had intermittent rash, small red faint rash, on arms, torso the past few days, but no large whelps. Continues to have itching though despite Claritin in the day and Benadryl last night.   No other new exposure.   No contacts with similar rash.  No new medications, no new foods, no new plant exposures.  No other aggravating or relieving factors.  She also notes few days with left ear pain.  No other c/o.  The following portions of the patient's history were reviewed and updated as appropriate: allergies, current medications, past family history, past medical history, past social history, past surgical history and problem list.  Past Medical History  Diagnosis Date  . Hypertension   . Hyperlipidemia   . Infertility, female   . Anticardiolipin antibody positive    Review of Systems Constitutional: -fever, -chills, -sweats, -unexpected -weight change,-fatigue ENT: -runny nose, +ear pain, -sore throat Cardiology:  -chest pain, -palpitations, -edema Respiratory: -cough, -shortness of breath, -wheezing Gastroenterology: -abdominal pain, -nausea, -vomiting, -diarrhea, -constipation Hematology: -bleeding or bruising problems Musculoskeletal: -arthralgias, -myalgias, -joint swelling, -back pain Ophthalmology: -vision changes Urology: -dysuria, -difficulty urinating, -hematuria, -urinary frequency, -urgency Neurology: -headache, -weakness, -tingling, -numbness    Objective:   Physical Exam  Filed Vitals:   06/29/11 1536  BP: 110/70  Pulse: 72  Temp: 98.7 F (37.1 C)  Resp: 16    General appearance: alert, no distress, WD/WN Skin: faint patch of erythema on bilat antecubital surfaces and right lower abdomen HEENT: normocephalic, sclerae anicteric, TMs pearly, nares patent, no discharge or erythema,  pharynx normal Oral cavity: MMM, no lesions   Assessment and Plan :    Encounter Diagnoses  Name Primary?  . Pruritic dermatitis Yes  . Otalgia    Etiology could be recent new detergent, but other triggers could be the cause.   Advised trigger avoidance, particularly the detergent, begin OTC Zantac, Hydroxyzine script sent, luke warm baths, and can use ice pack or topical hydrocortisone as needed.  If not improving we can add steroid.  Follow-up 2-3 days prn.

## 2011-07-08 ENCOUNTER — Telehealth: Payer: Self-pay | Admitting: *Deleted

## 2011-07-08 MED ORDER — FLUCONAZOLE 150 MG PO TABS: 150.0000 mg | ORAL_TABLET | Freq: Once | ORAL | Status: AC

## 2011-07-08 NOTE — Telephone Encounter (Signed)
Please call in Diflucan 150 by mouth x1 dose. Office visit if no relief.

## 2011-07-08 NOTE — Telephone Encounter (Signed)
Pt called complaining of yeast infection x 2days now. She is on her cycle now and cant come in for office visit. Lmp:07/06/11 she has itching only. Pt would like rx for diflucan if possible. Please advise .

## 2011-07-08 NOTE — Telephone Encounter (Signed)
Pt informed with the below note. 

## 2011-07-19 ENCOUNTER — Encounter: Payer: Self-pay | Admitting: Gynecology

## 2011-07-19 ENCOUNTER — Ambulatory Visit (INDEPENDENT_AMBULATORY_CARE_PROVIDER_SITE_OTHER): Payer: 59 | Admitting: Gynecology

## 2011-07-19 VITALS — BP 112/76

## 2011-07-19 DIAGNOSIS — N898 Other specified noninflammatory disorders of vagina: Secondary | ICD-10-CM

## 2011-07-19 DIAGNOSIS — B373 Candidiasis of vulva and vagina: Secondary | ICD-10-CM

## 2011-07-19 DIAGNOSIS — L293 Anogenital pruritus, unspecified: Secondary | ICD-10-CM

## 2011-07-19 DIAGNOSIS — Z113 Encounter for screening for infections with a predominantly sexual mode of transmission: Secondary | ICD-10-CM

## 2011-07-19 DIAGNOSIS — Z8619 Personal history of other infectious and parasitic diseases: Secondary | ICD-10-CM

## 2011-07-19 DIAGNOSIS — B3731 Acute candidiasis of vulva and vagina: Secondary | ICD-10-CM

## 2011-07-19 MED ORDER — TERCONAZOLE 0.8 % VA CREA: 1.0000 | TOPICAL_CREAM | Freq: Every day | VAGINAL | Status: DC

## 2011-07-19 NOTE — Progress Notes (Signed)
Will patient presented today complaining of vulvar week and half of vulvar pruritus no true discharge reported. Patient tried over-the-counter antifungal with minimal relief. Review of her record indicated she had trichomoniasis early part of this year and both her and her partner were treated. She is in a monogamous relationship. She had a Mirena IUD removed last month because of her discomfort and she was having. Her husband has had a vasectomy.  Exam: Bartholin urethra Skene glands: Within normal limits Vagina: No gross lesions on inspection slight white discharge was noted Cervix: No gross lesions on inspection  Wet prep demonstrated evidence of yeast  Assessment: Moniliasis  Plan: Terazol 7 to apply each bedtime for one week. Will notify does any abnormality in the [facility] and Chlamydia culture otherwise she scheduled to return back in 4 months for her annual exam or when necessary.

## 2011-07-21 ENCOUNTER — Telehealth: Payer: Self-pay | Admitting: *Deleted

## 2011-07-21 MED ORDER — TERCONAZOLE 0.4 % VA CREA: 1.0000 | TOPICAL_CREAM | Freq: Every day | VAGINAL | Status: AC

## 2011-07-21 NOTE — Telephone Encounter (Signed)
Pt called stating that on her office visit 07/19/11 you were going to give Rx for 7 day Terazol cream. But when pt went to pharmacy only 3 day Terazol cream was given. In office note it said pt will take Terazol 7 day cream. She took 3 day cream and no relief. Do you want pt to have 7 day Terazol cream? Or another Rx. Please advise.

## 2011-07-21 NOTE — Telephone Encounter (Signed)
Pt informed to take the below for 7 days.

## 2011-08-01 ENCOUNTER — Telehealth: Payer: Self-pay | Admitting: *Deleted

## 2011-08-01 MED ORDER — NYSTATIN-TRIAMCINOLONE 100000-0.1 UNIT/GM-% EX CREA: TOPICAL_CREAM | Freq: Two times a day (BID) | CUTANEOUS | Status: DC

## 2011-08-01 NOTE — Telephone Encounter (Signed)
Pt informed with the below note. rx sent to pharmacy

## 2011-08-01 NOTE — Telephone Encounter (Signed)
We're going to call mytrex cream to apply perianal for the patient's pruritus and yeast. She is to apply it. A newly and a little bit inside twice a day for the next 5-7 days.

## 2011-08-01 NOTE — Telephone Encounter (Signed)
Pt called c/o severe anal itching. Pt was seen on 07/19/11 and given Terazol cream and it slowed yeast down some, but pt states that at office visit she spoke with you about more so anal itching. Pt has anal itching all day long with no relief and has made her bottom raw. Pt was to know what she could do? She has tried hydrocortisone. Pt states she was given diflucan before and it did relief somewhat. Please advise

## 2011-11-11 ENCOUNTER — Encounter: Payer: Self-pay | Admitting: Medical

## 2011-11-11 ENCOUNTER — Ambulatory Visit: Payer: 59 | Admitting: Medical

## 2011-11-11 ENCOUNTER — Ambulatory Visit (INDEPENDENT_AMBULATORY_CARE_PROVIDER_SITE_OTHER): Payer: 59 | Admitting: Medical

## 2011-11-11 VITALS — BP 102/70 | HR 82 | Temp 98.4°F | Resp 16

## 2011-11-11 DIAGNOSIS — J329 Chronic sinusitis, unspecified: Secondary | ICD-10-CM

## 2011-11-11 DIAGNOSIS — H6690 Otitis media, unspecified, unspecified ear: Secondary | ICD-10-CM

## 2011-11-11 MED ORDER — AMOXICILLIN 875 MG PO TABS: 875.0000 mg | ORAL_TABLET | Freq: Two times a day (BID) | ORAL | Status: AC

## 2011-11-11 NOTE — Progress Notes (Signed)
Subjective:  Kelly [name] is a 46 y.o. female who presents for 4-5 day hx/o sinus issues.  Thought it was allergies, but yesterday having worse facial sinus pain, teeth pain and ear pain.  She notes lots of snot and sneezing, some bloody nasal discharge, decrease in taste.  She reports a little cough.  No NVD.  Been running low grade fever, last night 100.3.  Using Dayquil during the day, night time using nighttime multi symptoms cold medication.  Sick contacts at work - works here in [employer]/product [employer].  No other aggravating or relieving factors.  No other c/o.  Past Medical History  Diagnosis Date  . Hypertension   . Hyperlipidemia   . Infertility, female   . Anticardiolipin antibody positive    ROS as noted in HPI   Objective:   Filed Vitals:   11/11/11 1121  BP: 102/70  Pulse: 82  Temp: 98.4 F (36.9 C)  Resp: 16    General appearance: Alert, WD/WN, no distress                             Skin: warm, no rash                           Head: +frontal sinus tenderness,                            Eyes: conjunctiva normal, corneas clear, PERRLA                            Ears: left TM with erythema and flat, right TM with serous fluid, external ear canals normal                          Nose: septum midline, turbinates swollen, with erythema and clear discharge             Mouth/throat: MMM, tongue normal, mild pharyngeal erythema                           Neck: supple, no adenopathy, no thyromegaly, nontender                          Heart: RRR, normal S1, S2, no murmurs                         Lungs: CTA bilaterally, no wheezes, rales, or rhonchi      Assessment and Plan:   Encounter Diagnoses  Name Primary?  . Sinusitis Yes  . Otitis media    Prescription given for Amoxicillin.  Can use OTC Dayquil for congestion.  Tylenol or Ibuprofen OTC for fever and malaise.  Discussed symptomatic relief, nasal saline, and call or return if worse or not improving in 2-3 days.

## 2012-01-03 ENCOUNTER — Ambulatory Visit (INDEPENDENT_AMBULATORY_CARE_PROVIDER_SITE_OTHER): Payer: 59 | Admitting: Medical

## 2012-01-03 ENCOUNTER — Encounter: Payer: Self-pay | Admitting: Medical

## 2012-01-03 DIAGNOSIS — R5381 Other malaise: Secondary | ICD-10-CM

## 2012-01-03 DIAGNOSIS — R319 Hematuria, unspecified: Secondary | ICD-10-CM

## 2012-01-03 DIAGNOSIS — M545 Low back pain, unspecified: Secondary | ICD-10-CM

## 2012-01-03 DIAGNOSIS — E785 Hyperlipidemia, unspecified: Secondary | ICD-10-CM

## 2012-01-03 DIAGNOSIS — R5383 Other fatigue: Secondary | ICD-10-CM

## 2012-01-03 LAB — COMPREHENSIVE METABOLIC PANEL
ALT: 12 U/L (ref 0–35)
AST: 13 U/L (ref 0–37)
Albumin: 4.5 g/dL (ref 3.5–5.2)
Alkaline Phosphatase: 93 U/L (ref 39–117)
BUN: 13 mg/dL (ref 6–23)
CO2: 31 mEq/L (ref 19–32)
Calcium: 9.5 mg/dL (ref 8.4–10.5)
Chloride: 103 mEq/L (ref 96–112)
Creat: 0.71 mg/dL (ref 0.50–1.10)
Glucose, Bld: 82 mg/dL (ref 70–99)
Potassium: 4.1 mEq/L (ref 3.5–5.3)
Sodium: 141 mEq/L (ref 135–145)
Total Bilirubin: 0.5 mg/dL (ref 0.3–1.2)
Total Protein: 6.8 g/dL (ref 6.0–8.3)

## 2012-01-03 LAB — LIPID PANEL
Cholesterol: 194 mg/dL (ref 0–200)
HDL: 45 mg/dL (ref >39)
LDL Cholesterol: 96 mg/dL (ref 0–99)
Total CHOL/HDL Ratio: 4.3 Ratio
Triglycerides: 263 mg/dL — ABNORMAL HIGH (ref <150)
VLDL: 53 mg/dL — ABNORMAL HIGH (ref 0–40)

## 2012-01-03 LAB — CBC WITH DIFFERENTIAL/PLATELET
Basophils Absolute: 0 10*3/uL (ref 0.0–0.1)
Basophils Relative: 1 % (ref 0–1)
Eosinophils Absolute: 0.1 10*3/uL (ref 0.0–0.7)
Eosinophils Relative: 1 % (ref 0–5)
HCT: 38.8 % (ref 36.0–46.0)
Hemoglobin: 13.2 g/dL (ref 12.0–15.0)
Lymphocytes Relative: 32 % (ref 12–46)
Lymphs Abs: 2.5 10*3/uL (ref 0.7–4.0)
MCH: 30.6 pg (ref 26.0–34.0)
MCHC: 34 g/dL (ref 30.0–36.0)
MCV: 90 fL (ref 78.0–100.0)
Monocytes Absolute: 0.5 10*3/uL (ref 0.1–1.0)
Monocytes Relative: 7 % (ref 3–12)
Neutro Abs: 4.7 10*3/uL (ref 1.7–7.7)
Neutrophils Relative %: 60 % (ref 43–77)
Platelets: 261 10*3/uL (ref 150–400)
RBC: 4.31 MIL/uL (ref 3.87–5.11)
RDW: 12.2 % (ref 11.5–15.5)
WBC: 7.8 10*3/uL (ref 4.0–10.5)

## 2012-01-03 LAB — POCT URINALYSIS DIPSTICK
Bilirubin, UA: NEGATIVE
Glucose, UA: NEGATIVE
Ketones, UA: NEGATIVE
Leukocytes, UA: NEGATIVE
Nitrite, UA: NEGATIVE
Spec Grav, UA: 1.02
Urobilinogen, UA: NEGATIVE
pH, UA: 5

## 2012-01-03 LAB — TSH: TSH: 1.834 u[IU]/mL (ref 0.350–4.500)

## 2012-01-03 MED ORDER — NITROFURANTOIN MONOHYD MACRO 100 MG PO CAPS: 100.0000 mg | ORAL_CAPSULE | Freq: Two times a day (BID) | ORAL | Status: AC

## 2012-01-03 NOTE — Progress Notes (Signed)
Subjective:   HPI  Kelly [name] is a 46 y.o. female who presents for hematuria.  Last night saw pink coloration of the urine in the toilet on 3 separate occasional.  She notes hx/o UTI in the past, but denies current symptoms other than low right back.    Denies fever, belly pain, no increased urinary frequency or urgency, no burning, just some gross blood and low back pain x 1 days.  Denies hx/o kidney stones.  Denies trauma to abdomen, no recent fall.  She is a smoker.  She has been fatigued of late.  Otherwise has been in usual state of health. No other aggravating or relieving factors.    No other c/o.  The following portions of the patient's history were reviewed and updated as appropriate: allergies, current medications, past family history, past medical history, past social history, past surgical history and problem list.  Past Medical History  Diagnosis Date  . Hypertension   . Hyperlipidemia   . Infertility, female   . Anticardiolipin antibody positive     Allergies  Allergen Reactions  . Avelox (Moxifloxacin Hcl In Nacl)   . Imitrex (Sumatriptan Base)   . Morphine   . Septra (Bactrim)   . Zomig     Throat swelling     Review of Systems Constitutional: -fever, -chills, -sweats, -unexpected -weight change,+fatigue ENT: -runny nose, -ear pain, -sore throat Cardiology:  -chest pain, -palpitations, -edema Respiratory: -cough, -shortness of breath, -wheezing Gastroenterology: -abdominal pain, -nausea, -vomiting, -diarrhea, -constipation  Musculoskeletal: -arthralgias, -myalgias, -joint swelling, -back pain Ophthalmology: -vision changes Urology: -dysuria, -difficulty urinating, +hematuria, -urinary frequency, -urgency Neurology: -headache, -weakness, -tingling, -numbness    Objective:   Physical Exam  Filed Vitals:   01/03/12 1010  BP: 122/82  Pulse: 78  Temp: 98.2 F (36.8 C)    General appearance: alert, no distress, WD/WN Oral cavity: MMM, no lesions Neck:  supple, no lymphadenopathy, no thyromegaly, no masses Heart: RRR, normal S1, S2, no murmurs Lungs: CTA bilaterally, no wheezes, rhonchi, or rales Abdomen: +bs, soft, right sided tenderness throughout, otherwise non tender, no Murphy's sign, non distended, no masses, no hepatomegaly, no splenomegaly Back: mild right CVA tenderness Pulses: 2+ symmetric, upper and lower extremities, normal cap refill    Assessment and Plan :     Encounter Diagnoses  Name Primary?  . Hematuria Yes  . Low back pain   . Fatigue   . Hyperlipidemia    Urine sent for culture.  Urine micro in office today with lots of RBC, but otherwise unremarkable.  Will treat empirically with Macrobid.  Hydrate well, call if worse.   Fatigue - labs today  Hyperlipidemia - compliant with medication, labs today, low cholesterol diet.

## 2012-01-05 LAB — URINE CULTURE: Colony Count: >=100000

## 2012-03-22 ENCOUNTER — Telehealth: Payer: Self-pay | Admitting: Internal Medicine

## 2012-03-22 MED ORDER — DOXYCYCLINE HYCLATE 100 MG PO TABS: 100.0000 mg | ORAL_TABLET | Freq: Two times a day (BID) | ORAL | Status: AC

## 2012-03-22 NOTE — Telephone Encounter (Signed)
Called in med into pharmacy 

## 2012-04-11 ENCOUNTER — Telehealth: Payer: Self-pay | Admitting: Gynecology

## 2012-04-11 ENCOUNTER — Other Ambulatory Visit: Payer: Self-pay | Admitting: Gynecology

## 2012-04-11 DIAGNOSIS — Z30431 Encounter for routine checking of intrauterine contraceptive device: Secondary | ICD-10-CM

## 2012-04-11 MED ORDER — LEVONORGESTREL 20 MCG/24HR IU IUD: INTRAUTERINE_SYSTEM | Freq: Once | INTRAUTERINE | Status: DC

## 2012-04-11 NOTE — Telephone Encounter (Signed)
Patient informed that Mirena IUD, insertion and removal are covered by her [employer] insurance at 100%. No precertification required. She already has appointment schedule and I reminded her Dr. Glenetta Hew will want her to be on her menses with insertion.  Orders put in system and Mirena IUD reserved for her.

## 2012-04-18 ENCOUNTER — Encounter: Payer: Self-pay | Admitting: Gynecology

## 2012-04-18 ENCOUNTER — Ambulatory Visit (INDEPENDENT_AMBULATORY_CARE_PROVIDER_SITE_OTHER): Payer: 59 | Admitting: Gynecology

## 2012-04-18 VITALS — BP 120/70 | Ht 63.0 in | Wt 175.0 lb

## 2012-04-18 DIAGNOSIS — R635 Abnormal weight gain: Secondary | ICD-10-CM

## 2012-04-18 DIAGNOSIS — N920 Excessive and frequent menstruation with regular cycle: Secondary | ICD-10-CM

## 2012-04-18 DIAGNOSIS — Z01419 Encounter for gynecological examination (general) (routine) without abnormal findings: Secondary | ICD-10-CM

## 2012-04-18 DIAGNOSIS — E785 Hyperlipidemia, unspecified: Secondary | ICD-10-CM

## 2012-04-18 MED ORDER — PHENTERMINE HCL 37.5 MG PO CAPS: 37.5000 mg | ORAL_CAPSULE | ORAL | Status: DC

## 2012-04-18 MED ORDER — NYSTATIN-TRIAMCINOLONE 100000-0.1 UNIT/GM-% EX CREA: TOPICAL_CREAM | Freq: Three times a day (TID) | CUTANEOUS | Status: DC

## 2012-04-18 NOTE — Patient Instructions (Addendum)
Cholesterol Control Diet  Cholesterol levels in your body are determined significantly by your diet. Cholesterol levels may also be related to heart disease. The following material helps to explain this relationship and discusses what you can do to help keep your heart healthy. Not all cholesterol is bad. Low-density lipoprotein (LDL) cholesterol is the "bad" cholesterol. It may cause fatty deposits to build up inside your arteries. High-density lipoprotein (HDL) cholesterol is "good." It helps to remove the "bad" LDL cholesterol from your blood. Cholesterol is a very important risk factor for heart disease. Other risk factors are high blood pressure, smoking, stress, heredity, and weight. The heart muscle gets its supply of blood through the coronary arteries. If your LDL cholesterol is high and your HDL cholesterol is low, you are at risk for having fatty deposits build up in your coronary arteries. This leaves less room through which blood can flow. Without sufficient blood and oxygen, the heart muscle cannot function properly and you may feel chest pains (angina pectoris). When a coronary artery closes up entirely, a part of the heart muscle may die, causing a heart attack (myocardial infarction). CHECKING CHOLESTEROL When your caregiver sends your blood to a lab to be analyzed for cholesterol, a complete lipid (fat) profile may be done. With this test, the total amount of cholesterol and levels of LDL and HDL are determined. Triglycerides are a type of fat that circulates in the blood and can also be used to determine heart disease risk. The list below describes what the numbers should be: Test: Total Cholesterol.  Less than 200 mg/dl.  Test: LDL "bad cholesterol."  Less than 100 mg/dl.   Less than 70 mg/dl if you are at very high risk of a heart attack or sudden cardiac death.  Test: HDL "good cholesterol."  Greater than 50 mg/dl for women.    Greater than 40 mg/dl for men.  Test: Triglycerides.  Less than 150 mg/dl.  CONTROLLING CHOLESTEROL WITH DIET Although exercise and lifestyle factors are important, your diet is key. That is because certain foods are known to raise cholesterol and others to lower it. The goal is to balance foods for their effect on cholesterol and more importantly, to replace saturated and trans fat with other types of fat, such as monounsaturated fat, polyunsaturated fat, and omega-3 fatty acids. On average, a person should consume no more than 15 to 17 g of saturated fat daily. Saturated and trans fats are considered "bad" fats, and they will raise LDL cholesterol. Saturated fats are primarily found in animal products such as meats, butter, and cream. However, that does not mean you need to sacrifice all your favorite foods. Today, there are good tasting, low-fat, low-cholesterol substitutes for most of the things you like to eat. Choose low-fat or nonfat alternatives. Choose round or loin cuts of red meat, since these types of cuts are lowest in fat and cholesterol. Chicken (without the skin), fish, veal, and ground [place] breast are excellent choices. Eliminate fatty meats, such as hot dogs and salami. Even shellfish have little or no saturated fat. Have a 3 oz (85 g) portion when you eat lean meat, poultry, or fish. Trans fats are also called "partially hydrogenated oils." They are oils that have been scientifically manipulated so that they are solid at room temperature resulting in a longer shelf life and improved taste and texture of foods in which they are added. Trans fats are found in stick margarine, some tub margarines, cookies, crackers, and baked goods.  When baking and cooking, oils are an excellent substitute for butter. The monounsaturated oils are especially beneficial since it is believed they lower LDL and raise HDL. The oils you should avoid entirely are saturated tropical oils, such as coconut and  palm.  Remember to eat liberally from food groups that are naturally free of saturated and trans fat, including fish, fruit, vegetables, beans, grains (barley, rice, couscous, bulgur wheat), and pasta (without cream sauces).  IDENTIFYING FOODS THAT LOWER CHOLESTEROL  Soluble fiber may lower your cholesterol. This type of fiber is found in fruits such as apples, vegetables such as broccoli, potatoes, and carrots, legumes such as beans, peas, and lentils, and grains such as barley. Foods fortified with plant sterols (phytosterol) may also lower cholesterol. You should eat at least 2 g per day of these foods for a cholesterol lowering effect.  Read package labels to identify low-saturated fats, trans fats free, and low-fat foods at the supermarket. Select cheeses that have only 2 to 3 g saturated fat per ounce. Use a heart-healthy tub margarine that is free of trans fats or partially hydrogenated oil. When buying baked goods (cookies, crackers), avoid partially hydrogenated oils. Breads and muffins should be made from whole grains (whole-wheat or whole oat flour, instead of "flour" or "enriched flour"). Buy non-creamy canned soups with reduced salt and no added fats.  FOOD PREPARATION TECHNIQUES  Never deep-fry. If you must fry, either stir-fry, which uses very little fat, or use non-stick cooking sprays. When possible, broil, bake, or roast meats, and steam vegetables. Instead of dressing vegetables with butter or margarine, use lemon and herbs, applesauce and cinnamon (for squash and sweet potatoes), nonfat yogurt, salsa, and low-fat dressings for salads.  LOW-SATURATED FAT / LOW-FAT FOOD SUBSTITUTES Meats / Saturated Fat (g)  Avoid: Steak, marbled (3 oz/85 g) / 11 g   Choose: Steak, lean (3 oz/85 g) / 4 g   Avoid: Hamburger (3 oz/85 g) / 7 g   Choose: Hamburger, lean (3 oz/85 g) / 5 g   Avoid: Ham (3 oz/85 g) / 6 g   Choose: Ham, lean cut (3 oz/85 g) / 2.4 g   Avoid: Chicken, with skin, dark  meat (3 oz/85 g) / 4 g   Choose: Chicken, skin removed, dark meat (3 oz/85 g) / 2 g   Avoid: Chicken, with skin, light meat (3 oz/85 g) / 2.5 g   Choose: Chicken, skin removed, light meat (3 oz/85 g) / 1 g  Dairy / Saturated Fat (g)  Avoid: Whole milk (1 cup) / 5 g   Choose: Low-fat milk, 2% (1 cup) / 3 g   Choose: Low-fat milk, 1% (1 cup) / 1.5 g   Choose: Skim milk (1 cup) / 0.3 g   Avoid: Hard cheese (1 oz/28 g) / 6 g   Choose: Skim milk cheese (1 oz/28 g) / 2 to 3 g   Avoid: Cottage cheese, 4% fat (1 cup) / 6.5 g   Choose: Low-fat cottage cheese, 1% fat (1 cup) / 1.5 g   Avoid: Ice cream (1 cup) / 9 g   Choose: Sherbet (1 cup) / 2.5 g   Choose: Nonfat frozen yogurt (1 cup) / 0.3 g   Choose: Frozen fruit bar / trace   Avoid: Whipped cream (1 tbs) / 3.5 g   Choose: Nondairy whipped topping (1 tbs) / 1 g  Condiments / Saturated Fat (g)  Avoid: Mayonnaise (1 tbs) / 2 g   Choose: Low-fat  mayonnaise (1 tbs) / 1 g   Avoid: Butter (1 tbs) / 7 g   Choose: Extra light margarine (1 tbs) / 1 g   Avoid: Coconut oil (1 tbs) / 11.8 g   Choose: Olive oil (1 tbs) / 1.8 g   Choose: Corn oil (1 tbs) / 1.7 g   Choose: Safflower oil (1 tbs) / 1.2 g   Choose: Sunflower oil (1 tbs) / 1.4 g   Choose: Soybean oil (1 tbs) / 2.4 g   Choose: Canola oil (1 tbs) / 1 g  Document Released: 08/08/2005 Document Revised: 04/20/2011 Document Reviewed: 01/27/2011 [facility] Patient Information 2012 [place], [place].  Exercise to Lose Weight Exercise and a healthy diet may help you lose weight. Your doctor may suggest specific exercises. EXERCISE IDEAS AND TIPS  Choose low-cost things you enjoy doing, such as walking, bicycling, or exercising to workout videos.   Take stairs instead of the elevator.   Walk during your lunch break.   Park your car further away from work or school.   Go to a gym or an exercise class.   Start with 5 to 10 minutes of exercise each day. Build up to  30 minutes of exercise 4 to 6 days a week.   Wear shoes with good support and comfortable clothes.   Stretch before and after working out.   Work out until you breathe harder and your heart beats faster.   Drink extra water when you exercise.   Do not do so much that you hurt yourself, feel dizzy, or get very short of breath.  Exercises that burn about 150 calories:  Running 1  miles in 15 minutes.   Playing volleyball for 45 to 60 minutes.   Washing and waxing a car for 45 to 60 minutes.   Playing touch football for 45 minutes.   Walking 1  miles in 35 minutes.   Pushing a stroller 1  miles in 30 minutes.   Playing basketball for 30 minutes.   Raking leaves for 30 minutes.   Bicycling 5 miles in 30 minutes.   Walking 2 miles in 30 minutes.   Dancing for 30 minutes.   Shoveling snow for 15 minutes.   Swimming laps for 20 minutes.  Walking up stairs for 15 minutes.     Phentermine tablets or capsules What is this medicine? PHENTERMINE (FEN ter meen) decreases your appetite. It is used with a reduced calorie diet and exercise to help you lose weight. This medicine may be used for other purposes; ask your health care provider or pharmacist if you have questions. What should I tell my health care provider before I take this medicine? They need to know if you have any of these conditions: -agitation -glaucoma -heart disease -high blood pressure -history of substance abuse -lung disease called Primary Pulmonary Hypertension (PPH) -taken an MAOI like Carbex, Eldepryl, Marplan, Nardil, or Parnate in last 14 days -thyroid disease -an unusual or allergic reaction to phentermine, other medicines, foods, dyes, or preservatives -pregnant or trying to get pregnant -breast-feeding How should I use this medicine? Take this medicine by mouth with a glass of water. Follow the directions on the prescription label. This medicine is usually taken 30 minutes before or 1 to 2  hours after breakfast. Avoid taking this medicine in the evening. It may interfere with sleep. Take your doses at regular intervals. Do not take your medicine more often than directed. Talk to your pediatrician regarding the use of this  medicine in children. Special care may be needed. Overdosage: If you think you have taken too much of this medicine contact a poison control center or emergency room at once. NOTE: This medicine is only for you. Do not share this medicine with others. What if I miss a dose? If you miss a dose, take it as soon as you can. If it is almost time for your next dose, take only that dose. Do not take double or extra doses. What may interact with this medicine? Do not take this medicine with any of the following medications: -duloxetine -MAOIs like Carbex, Eldepryl, Marplan, Nardil, and Parnate -medicines for colds or breathing difficulties like pseudoephedrine or phenylephrine -procarbazine -sibutramine -SSRIs like citalopram, escitalopram, fluoxetine, fluvoxamine, paroxetine, and sertraline -stimulants like dexmethylphenidate, methylphenidate or modafinil -venlafaxine This medicine may also interact with the following medications: -medicines for diabetes This list may not describe all possible interactions. Give your health care provider a list of all the medicines, herbs, non-prescription drugs, or dietary supplements you use. Also tell them if you smoke, drink alcohol, or use illegal drugs. Some items may interact with your medicine. What should I watch for while using this medicine? Notify your physician immediately if you become short of breath while doing your normal activities. Do not take this medicine within 6 hours of bedtime. It can keep you from getting to sleep. Avoid drinks that contain caffeine and try to stick to a regular bedtime every night. This medicine was intended to be used in addition to a healthy diet and exercise. The best results are achieved  this way. This medicine is only indicated for short-term use. Eventually your weight loss may level out. At that point, the drug will only help you maintain your new weight. Do not increase or in any way change your dose without consulting your doctor. You may get drowsy or dizzy. Do not drive, use machinery, or do anything that needs mental alertness until you know how this medicine affects you. Do not stand or sit up quickly, especially if you are an older patient. This reduces the risk of dizzy or fainting spells. Alcohol may increase dizziness and drowsiness. Avoid alcoholic drinks. What side effects may I notice from receiving this medicine? Side effects that you should report to your doctor or health care professional as soon as possible: -chest pain, palpitations -depression or severe changes in mood -increased blood pressure -irritability -nervousness or restlessness -severe dizziness -shortness of breath -problems urinating -unusual swelling of the legs -vomiting Side effects that usually do not require medical attention (report to your doctor or health care professional if they continue or are bothersome): -blurred vision or other eye problems -changes in sexual ability or desire -constipation or diarrhea -difficulty sleeping -dry mouth or unpleasant taste -headache -nausea This list may not describe all possible side effects. Call your doctor for medical advice about side effects. You may report side effects to FDA at 1-800-FDA-1088. Where should I keep my medicine? Keep out of the reach of children. This medicine can be abused. Keep your medicine in a safe place to protect it from theft. Do not share this medicine with anyone. Selling or giving away this medicine is dangerous and against the law. Store at room temperature between 20 and 25 degrees C (68 and 77 degrees F). Keep container tightly closed. Throw away any unused medicine after the expiration date. NOTE: This sheet is  a summary. It may not cover all possible information. If you have questions about  this medicine, talk to your doctor, pharmacist, or health care provider.  2012, [employer]. (09/22/2010 11:02:44 AM)

## 2012-04-19 NOTE — Progress Notes (Signed)
Kelly [name] 1965/10/10 [id number]   History:    46 y.o.  for annual gyn exam who states that at time during her menses she suffers from pruritus. Also she stated her periods are getting heavier. She had a Mirena IUD for 5 years and then was switched and the second Mirena that was placed was removed a short time thereafter because she was causing her discomfort. Her husband has had a vasectomy. She is interested in having a Mirena be replaced again. She's complained of weight gain. Patient with known history of positive anticardiolipin antibodies. She also has a history of hypertension for which her primary physician has been monitoring and had all her laboratory done in may of this year. She's currently on Lipitor for hyperlipidemia. Patient is overdue for mammogram last mammogram 4 years ago which was normal. Patient frequently does her self breast examination.  Past medical history,surgical history, family history and social history were all reviewed and documented in the EPIC chart.  Gynecologic History Patient's last menstrual period was 03/21/2012. Contraception: vasectomy Last Pap:  2011. Results were: normal Last mammogram:  4 years ago. Results were: normal  Obstetric History OB History    Grav Para Term Preterm Abortions TAB SAB Ect Mult Living   4 3 3  1     3      # Outc Date GA Lbr Len/2nd Wgt Sex Del Anes PTL Lv   1 TRM     M CS  No Yes   2 TRM     F CS  No Yes   3 TRM     F CS  No Yes   4 ABT                ROS: A ROS was performed and pertinent positives and negatives are included in the history.  GENERAL: No fevers or chills. HEENT: No change in vision, no earache, sore throat or sinus congestion. NECK: No pain or stiffness. CARDIOVASCULAR: No chest pain or pressure. No palpitations. PULMONARY: No shortness of breath, cough or wheeze. GASTROINTESTINAL: No abdominal pain, nausea, vomiting or diarrhea, melena or bright red blood per rectum. GENITOURINARY: No urinary  frequency, urgency, hesitancy or dysuria. MUSCULOSKELETAL: No joint or muscle pain, no back pain, no recent trauma. DERMATOLOGIC: No rash, no itching, no lesions. ENDOCRINE: No polyuria, polydipsia, no heat or cold intolerance. No recent change in weight. HEMATOLOGICAL: No anemia or easy bruising or bleeding. NEUROLOGIC: No headache, seizures, numbness, tingling or weakness. PSYCHIATRIC: No depression, no loss of interest in normal activity or change in sleep pattern.     Exam: chaperone present  BP 120/70  Ht 5\' 3"  (1.6 m)  Wt 175 lb (79.379 kg)  BMI 31.00 kg/m2  LMP 03/21/2012  Body mass index is 31.00 kg/(m^2).  General appearance : Well developed well nourished female. No acute distress HEENT: Neck supple, trachea midline, no carotid bruits, no thyroidmegaly Lungs: Clear to auscultation, no rhonchi or wheezes, or rib retractions  Heart: Regular rate and rhythm, no murmurs or gallops Breast:Examined in sitting and supine position were symmetrical in appearance, no palpable masses or tenderness,  no skin retraction, no nipple inversion, no nipple discharge, no skin discoloration, no axillary or supraclavicular lymphadenopathy Abdomen: no palpable masses or tenderness, no rebound or guarding Extremities: no edema or skin discoloration or tenderness  Pelvic:  Bartholin, Urethra, Skene Glands: Within normal limits             Vagina: No gross lesions or discharge  Cervix: No gross lesions or discharge  Uterus   anteverted, normal size, shape and consistency, non-tender and mobile  Adnexa  Without masses or tenderness  Anus and perineum  normal   Rectovaginal  normal sphincter tone without palpated masses or tenderness             Hemoccult  not done      Assessment/Plan:  46 y.o. female for annual exam  was given a prescription of mytrex cream to apply in the perineal area where she suffers from pruritus at time of her menses. She can apply to 3 times a day when necessary. She will  schedule with her upcoming menstrual cycle to have the Mirena IUD placed. She was given a requisition to schedule her mammogram which is overdue. We encouraged her to do her monthly self breast examination. We discussed importance of calcium vitamin D for osteoporosis prevention as well as weight bearing exercise 3-4 times a week. We discussed also the new Pap smear screening guidelines and she will not need one until next year.  Ok Edwards MD, 8:52 AM 04/19/2012

## 2012-04-25 ENCOUNTER — Ambulatory Visit (INDEPENDENT_AMBULATORY_CARE_PROVIDER_SITE_OTHER): Payer: 59 | Admitting: Gynecology

## 2012-04-25 ENCOUNTER — Other Ambulatory Visit: Payer: Self-pay | Admitting: Gynecology

## 2012-04-25 ENCOUNTER — Ambulatory Visit: Payer: 59 | Admitting: Gynecology

## 2012-04-25 ENCOUNTER — Ambulatory Visit (INDEPENDENT_AMBULATORY_CARE_PROVIDER_SITE_OTHER): Payer: 59

## 2012-04-25 DIAGNOSIS — N920 Excessive and frequent menstruation with regular cycle: Secondary | ICD-10-CM

## 2012-04-25 DIAGNOSIS — N882 Stricture and stenosis of cervix uteri: Secondary | ICD-10-CM

## 2012-04-25 DIAGNOSIS — Z30430 Encounter for insertion of intrauterine contraceptive device: Secondary | ICD-10-CM

## 2012-04-25 DIAGNOSIS — N949 Unspecified condition associated with female genital organs and menstrual cycle: Secondary | ICD-10-CM

## 2012-04-25 MED ORDER — LIDOCAINE HCL 1 % IJ SOLN
5.0000 mL | Freq: Once | INTRAMUSCULAR | Status: AC
  Administered 2012-04-25: 5 mL

## 2012-04-25 NOTE — Progress Notes (Signed)
46 year old patient presented to the office to have her Mirena IUD put in. Patient had a Mirena IUD for 5 years and then was switched and the second Mirena was placed but was removed a short time thereafter because she was complaining of pelvic discomfort. She presented to the office to insert the IUD under sonographic guidance as a result of her cervical stenosis. Patient's fully aware that the Mirena IUD is 99% effective and is good for 5 years.  Exam: Abdomen: Soft nontender no rebound or guarding Pelvic: Bartholin urethra Skene was within normal limits Vagina: No lesions or discharge patient currently menstruating Cervix: No lesions or discharge Uterus: Anteverted normal size shape and consistency Adnexa: No palpable masses or tenderness  Procedure: The cervix was cleansed with Betadine solution. A paracervical block with 1% lidocaine was infiltrated into the cervical stroma at the 248 and 10:00 position for a total of 10 cc. Her uterus sounded to 8-1/2 cm but prior to this required cervical dilatation and never to insert in a sterile fashion the Mirena IUD. This was inserted with sonographic guidance which confirmed appropriate placement of the IUD in the uterine fundus. The string was cut patient tolerated procedure well and will return back in one month for followup.

## 2012-04-26 ENCOUNTER — Encounter: Payer: Self-pay | Admitting: Gynecology

## 2012-04-26 ENCOUNTER — Encounter: Payer: 59 | Admitting: Gynecology

## 2012-06-14 ENCOUNTER — Encounter: Payer: Self-pay | Admitting: Family Medicine

## 2012-06-14 ENCOUNTER — Ambulatory Visit (INDEPENDENT_AMBULATORY_CARE_PROVIDER_SITE_OTHER): Payer: 59 | Admitting: Family Medicine

## 2012-06-14 VITALS — BP 110/76 | HR 72 | Temp 98.6°F | Ht 63.5 in | Wt 169.0 lb

## 2012-06-14 DIAGNOSIS — R519 Headache, unspecified: Secondary | ICD-10-CM

## 2012-06-14 DIAGNOSIS — R51 Headache: Secondary | ICD-10-CM

## 2012-06-14 DIAGNOSIS — F17200 Nicotine dependence, unspecified, uncomplicated: Secondary | ICD-10-CM

## 2012-06-14 DIAGNOSIS — J069 Acute upper respiratory infection, unspecified: Secondary | ICD-10-CM

## 2012-06-14 NOTE — Progress Notes (Signed)
  Subjective:    Patient ID: Kelly [name], female    DOB: March 08, 1966, 46 y.o.   MRN: [id number]  HPI She has a two-day history of left ear pain, jaw upper and lower teeth and maxillary sinuses. She's had some rhinorrhea and sinus pressure but no PND fever or chills. She also she complains of a tingling sensation to the left side of her face including jaw. She does smoke and is not interested in quitting to Review of Systems     Objective:   Physical Exam alert and in no distress. EOMI. Other cranial nerves grossly intact Tympanic membranes and canals are normal. Throat is clear. Tonsils are normal. Neck is supple without adenopathy or thyromegaly. Cardiac exam shows a regular sinus rhythm without murmurs or gallops. Lungs are clear to auscultation. Tender to palpation over frontal as well as left maxillary sinus and TMJ.        Assessment & Plan:   1. Left facial pain   2. Current smoker    will treat facial pain conservatively with anti-inflammatory medicines and keep in touch with her. Caution her to let us know if she has any postnasal drainage numbness tingling , weakness or rash of her face. She is not interested in quitting smoking at this time.

## 2012-06-15 NOTE — Progress Notes (Signed)
Pt's appointment was cancelled, seen by another provider

## 2012-06-19 ENCOUNTER — Other Ambulatory Visit: Payer: Self-pay | Admitting: Medical

## 2012-06-19 DIAGNOSIS — E041 Nontoxic single thyroid nodule: Secondary | ICD-10-CM

## 2012-06-26 ENCOUNTER — Ambulatory Visit (INDEPENDENT_AMBULATORY_CARE_PROVIDER_SITE_OTHER): Payer: 59 | Admitting: Gynecology

## 2012-06-26 ENCOUNTER — Encounter: Payer: Self-pay | Admitting: Gynecology

## 2012-06-26 VITALS — BP 128/88 | Wt 170.0 lb

## 2012-06-26 DIAGNOSIS — R635 Abnormal weight gain: Secondary | ICD-10-CM

## 2012-06-26 DIAGNOSIS — Z30431 Encounter for routine checking of intrauterine contraceptive device: Secondary | ICD-10-CM

## 2012-06-26 MED ORDER — PHENTERMINE HCL 37.5 MG PO CAPS: 37.5000 mg | ORAL_CAPSULE | ORAL | Status: DC

## 2012-06-26 NOTE — Progress Notes (Signed)
The patient presented to the office today for one month followup after having placed the Mirena IUD she has been asymptomatic. Patient was started on phentermine 37.5 mg on August 28 in an effort to help patient lose weight. We have had an extensive counseling session and we had discussed the potential risk such as pulmonary hypertension and cardiac valvular heart disease. Patient has been working on her diet and exercising regularly. On October 28 she was weighing 175 pounds and today she's waiting 170 pounds.  Exam: Blood pressure 120/88 Lungs clear to auscultation no rales or wheezes Heart regular rate and rhythm no murmurs or gallops Neck: No carotid bruits Pelvic: Bartholin urethra Skene was within normal limits Vagina: No lesions or discharge Cervix: IUD string seen Uterus: Anteverted normal size shape and consistency Adnexa: No palpable masses or tenderness Rectal exam not done  Assessment/plan: Patient one month status post placement of Mirena IUD doing well. Patient will continue on the phentermine she has requested to go additional 4 months to complete a six-month treatment. She fully understands the potential risk and accepts them. She will be prescribed phentermine 37.5 mg to continue to take 1 tablet daily but only to complete a six-month course. She has promised to continue to engage in regular exercise and proper diet. She'll return to the office in 2 months for followup exams. 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

## 2012-06-29 ENCOUNTER — Encounter: Payer: Self-pay | Admitting: Family Medicine

## 2012-06-29 ENCOUNTER — Ambulatory Visit (INDEPENDENT_AMBULATORY_CARE_PROVIDER_SITE_OTHER): Payer: 59 | Admitting: Family Medicine

## 2012-06-29 VITALS — BP 124/80

## 2012-06-29 DIAGNOSIS — G43101 Migraine with aura, not intractable, with status migrainosus: Secondary | ICD-10-CM

## 2012-06-29 DIAGNOSIS — G43801 Other migraine, not intractable, with status migrainosus: Secondary | ICD-10-CM

## 2012-06-29 MED ORDER — TRAMADOL HCL 50 MG PO TABS: 50.0000 mg | ORAL_TABLET | Freq: Three times a day (TID) | ORAL | Status: DC | PRN

## 2012-06-29 MED ORDER — KETOROLAC TROMETHAMINE 60 MG/2ML IM SOLN
60.0000 mg | Freq: Once | INTRAMUSCULAR | Status: AC
  Administered 2012-06-29: 60 mg via INTRAMUSCULAR

## 2012-06-29 MED ORDER — PREDNISONE 10 MG PO KIT: PACK | ORAL | Status: DC

## 2012-06-29 MED ORDER — KETOROLAC TROMETHAMINE 60 MG/2ML IM SOLN: 60.0000 mg | Freq: Once | INTRAMUSCULAR | Status: DC

## 2012-06-29 MED ORDER — SODIUM CHLORIDE 0.9 % IV SOLN
125.0000 mg | Freq: Once | INTRAVENOUS | Status: AC
  Administered 2012-06-29: 130 mg via INTRAMUSCULAR

## 2012-06-29 MED ORDER — METHYLPREDNISOLONE SODIUM SUCC 125 MG IJ SOLR: 125.0000 mg | Freq: Once | INTRAMUSCULAR | Status: DC

## 2012-06-29 NOTE — Progress Notes (Signed)
  Subjective:    Patient ID: Kelly [name], female    DOB: Apr 15, 1966, 46 y.o.   MRN: [id number]  HPI She is here for evaluation of headache that she describes as throbbing and usually unilateral but going from one side to the other. There is some associated photophobia and dizziness. She did try Axert did help slightly. No nasal congestion, rhinorrhea, PND, sneezing itchy watery eyes. She has been using 800 mg ibuprofen 3 times per day for the last week.   Review of Systems     Objective:   Physical Exam Alert and in no distress. EOMI. Throat clear. Neck supple without adenopathy.       Assessment & Plan:   1. Migraine variant with status migrainosus  ketorolac (TORADOL) injection 60 mg, methylPREDNISolone sodium succinate (SOLU-MEDROL) 130 mg in sodium chloride 0.9 % 50 mL IVPB   She did obtain relief with the Toradol but not complete. I will also call in tramadol to see if this will also help to get rid of the headache

## 2012-07-04 ENCOUNTER — Ambulatory Visit
Admission: RE | Admit: 2012-07-04 | Discharge: 2012-07-04 | Disposition: A | Discharge status: Home / Self Care | Payer: 59 | Source: Ambulatory Visit | Attending: Medical | Admitting: Medical

## 2012-07-04 DIAGNOSIS — E041 Nontoxic single thyroid nodule: Secondary | ICD-10-CM

## 2012-08-06 ENCOUNTER — Ambulatory Visit (INDEPENDENT_AMBULATORY_CARE_PROVIDER_SITE_OTHER): Payer: 59 | Admitting: Family Medicine

## 2012-08-06 ENCOUNTER — Encounter: Payer: Self-pay | Admitting: Family Medicine

## 2012-08-06 VITALS — BP 120/88 | Temp 98.9°F

## 2012-08-06 DIAGNOSIS — J019 Acute sinusitis, unspecified: Secondary | ICD-10-CM

## 2012-08-06 DIAGNOSIS — F17200 Nicotine dependence, unspecified, uncomplicated: Secondary | ICD-10-CM

## 2012-08-06 MED ORDER — AZITHROMYCIN 500 MG PO TABS: 500.0000 mg | ORAL_TABLET | Freq: Every day | ORAL | Status: AC

## 2012-08-06 NOTE — Progress Notes (Signed)
  Subjective:    Patient ID: Kelly [name], female    DOB: [birth year], 46 y.o.   MRN: [id number]  HPI She has a three-day history of sinus pain and pressure however last night she had a relatively sudden onset of fever, chills, myalgias, mucopurulent nasal drainage with some blood with left sided maxillary sinus pain as well as left upper tooth discomfort. She continues to smoke. She does have seasonal allergies  Review of Systems     Objective:   Physical Exam alert and in no distress. Tympanic membranes and canals are normal. Throat is clear. Tonsils are normal. Neck is supple without adenopathy or thyromegaly. Cardiac exam shows a regular sinus rhythm without murmurs or gallops. Lungs are clear to auscultation. Nasal mucosa is red with tenderness over frontal and maxillary sinuses, left greater than right.       Assessment & Plan:   1. Acute sinusitis  azithromycin (ZITHROMAX) 500 MG tablet  2. Current smoker    Take the azithromycin however in a week if you're not totally back to normal refill it. Advil or Aleve for pain and pressure

## 2012-08-06 NOTE — Patient Instructions (Addendum)
Take the azithromycin however in a week if you're not totally back to normal refill it. Advil or Aleve for pain and pressure

## 2012-08-09 ENCOUNTER — Other Ambulatory Visit: Payer: Self-pay | Admitting: Family Medicine

## 2012-08-09 MED ORDER — AMOXICILLIN-POT CLAVULANATE 875-125 MG PO TABS: 1.0000 | ORAL_TABLET | Freq: Two times a day (BID) | ORAL | Status: AC

## 2012-08-09 NOTE — Progress Notes (Signed)
She has made no improvement since starting on the azithromycin. I will switch her to Augmentin.

## 2012-08-31 ENCOUNTER — Telehealth: Payer: Self-pay | Admitting: *Deleted

## 2012-08-31 MED ORDER — PHENTERMINE HCL 37.5 MG PO CAPS: 37.5000 mg | ORAL_CAPSULE | ORAL | Status: DC

## 2012-08-31 NOTE — Telephone Encounter (Signed)
Pt pharmacy never received her Rx for phentermine 37.5 mg, rx called in.

## 2012-10-06 ENCOUNTER — Other Ambulatory Visit: Payer: Self-pay

## 2012-10-27 ENCOUNTER — Emergency Department (HOSPITAL_COMMUNITY)
Admission: EM | Admit: 2012-10-27 | Discharge: 2012-10-27 | Disposition: A | Discharge status: Nursing Home (Includes ICF) | Payer: 59 | Source: Home / Self Care

## 2012-10-27 ENCOUNTER — Encounter (HOSPITAL_COMMUNITY): Payer: Self-pay

## 2012-10-27 ENCOUNTER — Emergency Department (HOSPITAL_COMMUNITY): Payer: 59

## 2012-10-27 ENCOUNTER — Emergency Department (HOSPITAL_COMMUNITY)
Admission: EM | Admit: 2012-10-27 | Discharge: 2012-10-27 | Disposition: A | Discharge status: Home / Self Care | Payer: 59 | Attending: Emergency Medicine | Admitting: Emergency Medicine

## 2012-10-27 ENCOUNTER — Encounter (HOSPITAL_COMMUNITY): Payer: Self-pay | Admitting: Emergency Medicine

## 2012-10-27 DIAGNOSIS — N200 Calculus of kidney: Secondary | ICD-10-CM

## 2012-10-27 DIAGNOSIS — F17200 Nicotine dependence, unspecified, uncomplicated: Secondary | ICD-10-CM | POA: Insufficient documentation

## 2012-10-27 DIAGNOSIS — Z8742 Personal history of other diseases of the female genital tract: Secondary | ICD-10-CM | POA: Insufficient documentation

## 2012-10-27 DIAGNOSIS — R110 Nausea: Secondary | ICD-10-CM | POA: Insufficient documentation

## 2012-10-27 DIAGNOSIS — Z79899 Other long term (current) drug therapy: Secondary | ICD-10-CM | POA: Insufficient documentation

## 2012-10-27 DIAGNOSIS — I10 Essential (primary) hypertension: Secondary | ICD-10-CM | POA: Insufficient documentation

## 2012-10-27 DIAGNOSIS — E785 Hyperlipidemia, unspecified: Secondary | ICD-10-CM | POA: Insufficient documentation

## 2012-10-27 LAB — COMPREHENSIVE METABOLIC PANEL
ALT: 12 U/L (ref 0–35)
AST: 14 U/L (ref 0–37)
Albumin: 4.1 g/dL (ref 3.5–5.2)
Alkaline Phosphatase: 106 U/L (ref 39–117)
BUN: 10 mg/dL (ref 6–23)
CO2: 22 mEq/L (ref 19–32)
Calcium: 9.3 mg/dL (ref 8.4–10.5)
Chloride: 101 mEq/L (ref 96–112)
Creatinine, Ser: 0.66 mg/dL (ref 0.50–1.10)
GFR calc Af Amer: >90 mL/min (ref >90)
GFR calc non Af Amer: >90 mL/min (ref >90)
Glucose, Bld: 92 mg/dL (ref 70–99)
Potassium: 3.6 mEq/L (ref 3.5–5.1)
Sodium: 136 mEq/L (ref 135–145)
Total Bilirubin: 0.6 mg/dL (ref 0.3–1.2)
Total Protein: 7.2 g/dL (ref 6.0–8.3)

## 2012-10-27 LAB — CBC WITH DIFFERENTIAL/PLATELET
Basophils Absolute: 0 10*3/uL (ref 0.0–0.1)
Basophils Relative: 0 % (ref 0–1)
Eosinophils Absolute: 0 10*3/uL (ref 0.0–0.7)
Eosinophils Relative: 0 % (ref 0–5)
HCT: 37.6 % (ref 36.0–46.0)
Hemoglobin: 13.4 g/dL (ref 12.0–15.0)
Lymphocytes Relative: 19 % (ref 12–46)
Lymphs Abs: 2 10*3/uL (ref 0.7–4.0)
MCH: 30.8 pg (ref 26.0–34.0)
MCHC: 35.6 g/dL (ref 30.0–36.0)
MCV: 86.4 fL (ref 78.0–100.0)
Monocytes Absolute: 0.6 10*3/uL (ref 0.1–1.0)
Monocytes Relative: 6 % (ref 3–12)
Neutro Abs: 7.7 10*3/uL (ref 1.7–7.7)
Neutrophils Relative %: 75 % (ref 43–77)
Platelets: 270 10*3/uL (ref 150–400)
RBC: 4.35 MIL/uL (ref 3.87–5.11)
RDW: 12.7 % (ref 11.5–15.5)
WBC: 10.4 10*3/uL (ref 4.0–10.5)

## 2012-10-27 LAB — POCT URINALYSIS DIP (DEVICE)
Bilirubin Urine: NEGATIVE
Glucose, UA: NEGATIVE mg/dL
Ketones, ur: 15 mg/dL — AB
Leukocytes, UA: NEGATIVE
Nitrite: NEGATIVE
Protein, ur: 30 mg/dL — AB
Specific Gravity, Urine: 1.02 (ref 1.005–1.030)
Urobilinogen, UA: 0.2 mg/dL (ref 0.0–1.0)
pH: 8.5 — ABNORMAL HIGH (ref 5.0–8.0)

## 2012-10-27 LAB — POCT PREGNANCY, URINE: Preg Test, Ur: NEGATIVE

## 2012-10-27 MED ORDER — FENTANYL CITRATE 0.05 MG/ML IJ SOLN
50.0000 ug | Freq: Once | INTRAMUSCULAR | Status: AC
  Administered 2012-10-27: 50 ug via INTRAVENOUS
  Filled 2012-10-27: qty 2

## 2012-10-27 MED ORDER — KETOROLAC TROMETHAMINE 30 MG/ML IJ SOLN
30.0000 mg | Freq: Once | INTRAMUSCULAR | Status: AC
  Administered 2012-10-27: 30 mg via INTRAMUSCULAR

## 2012-10-27 MED ORDER — KETOROLAC TROMETHAMINE 30 MG/ML IJ SOLN
INTRAMUSCULAR | Status: AC
  Filled 2012-10-27: qty 1

## 2012-10-27 MED ORDER — ONDANSETRON HCL 4 MG/2ML IJ SOLN
4.0000 mg | Freq: Once | INTRAMUSCULAR | Status: AC
  Administered 2012-10-27: 4 mg via INTRAVENOUS
  Filled 2012-10-27: qty 2

## 2012-10-27 MED ORDER — ONDANSETRON 4 MG PO TBDP
4.0000 mg | ORAL_TABLET | Freq: Once | ORAL | Status: AC
  Administered 2012-10-27: 4 mg via ORAL

## 2012-10-27 MED ORDER — OXYCODONE-ACETAMINOPHEN 5-325 MG PO TABS: 2.0000 | ORAL_TABLET | ORAL | Status: DC | PRN

## 2012-10-27 MED ORDER — ONDANSETRON 8 MG PO TBDP: ORAL_TABLET | ORAL | Status: DC

## 2012-10-27 MED ORDER — ONDANSETRON 4 MG PO TBDP
ORAL_TABLET | ORAL | Status: AC
  Filled 2012-10-27: qty 1

## 2012-10-27 NOTE — ED Provider Notes (Signed)
History     CSN: [id number]  Arrival date & time 10/27/12  1354   First MD Initiated Contact with Patient 10/27/12 1601      Chief Complaint  Patient presents with  . Flank Pain    (Consider location/radiation/quality/duration/timing/severity/associated sxs/prior treatment) HPI Comments: Patient presents with sudden onset of right flank pain that started earlier this morning. She states it starts in her back and radiates to her right lower quadrant. She says it's been constant at times worsens. She's had some nausea but no vomiting. Denies any fevers or chills. She denies any difficulty urinating or hematuria. Denies any burning on urination. Denies a known history of kidney stones however does have a family history of kidney stones. She states there is nothing that she does that either worsens it or improves it. She was seen in urgent care Center prior to being sent here and had hematuria on urinalysis. She was given a shot of Toradol and that did seem to ease up the pain however now it's intensifying again.  Patient is a 47 y.o. female presenting with flank pain.  Flank Pain Associated symptoms include abdominal pain. Pertinent negatives include no chest pain, no headaches and no shortness of breath.    Past Medical History  Diagnosis Date  . Hyperlipidemia   . Infertility, female   . Anticardiolipin antibody positive   . Hypertension     diet controlled    Past Surgical History  Procedure Laterality Date  . Cesarean section      times three    Family History  Problem Relation Age of Onset  . Diabetes Father   . Hypertension Father   . Cancer Father     colon cancer  . Heart disease Mother   . Breast cancer Maternal Grandmother     History  Substance Use Topics  . Smoking status: Current Every Day Smoker -- 0.50 packs/day    Types: Cigarettes  . Smokeless tobacco: Never Used  . Alcohol Use: No    OB History   Grav Para Term Preterm Abortions TAB SAB Ect Mult  Living   4 3 3  1     3       Review of Systems  Constitutional: Negative for fever, chills, diaphoresis and fatigue.  HENT: Negative for congestion, rhinorrhea and sneezing.   Eyes: Negative.   Respiratory: Negative for cough, chest tightness and shortness of breath.   Cardiovascular: Negative for chest pain and leg swelling.  Gastrointestinal: Positive for nausea and abdominal pain. Negative for vomiting, diarrhea and blood in stool.  Genitourinary: Positive for flank pain. Negative for frequency, hematuria and difficulty urinating.  Musculoskeletal: Negative for back pain and arthralgias.  Skin: Negative for rash.  Neurological: Negative for dizziness, speech difficulty, weakness, numbness and headaches.    Allergies  Imitrex; Zomig; Avelox; Morphine; and Septra  Home Medications   Current Outpatient Rx  Name  Route  Sig  Dispense  Refill  . atorvastatin (LIPITOR) 20 MG tablet   Oral   Take 20 mg by mouth daily.           Marland Kitchen levonorgestrel (MIRENA) 20 MCG/24HR IUD   Intrauterine   1 each by Intrauterine route once.         . Multiple Vitamin (MULTIVITAMIN WITH MINERALS) TABS   Oral   Take 1 tablet by mouth daily.         . phentermine 37.5 MG capsule   Oral   Take 1 capsule (37.5 mg  total) by mouth every morning.   30 capsule   2   . ondansetron (ZOFRAN ODT) 8 MG disintegrating tablet      8mg  ODT q4 hours prn nausea   12 tablet   0   . oxyCODONE-acetaminophen (PERCOCET) 5-325 MG per tablet   Oral   Take 2 tablets by mouth every 4 (four) hours as needed for pain.   20 tablet   0     BP 98/62  Pulse 72  Temp(Src) 98.2 F (36.8 C) (Oral)  Resp 20  SpO2 97%  LMP 08/03/2012  Physical Exam  Constitutional: She is oriented to person, place, and time. She appears well-developed and well-nourished.  HENT:  Head: Normocephalic and atraumatic.  Eyes: Pupils are equal, round, and reactive to light.  Neck: Normal range of motion. Neck supple.   Cardiovascular: Normal rate, regular rhythm and normal heart sounds.   Pulmonary/Chest: Effort normal and breath sounds normal. No respiratory distress. She has no wheezes. She has no rales. She exhibits no tenderness.  Abdominal: Soft. Bowel sounds are normal. There is tenderness (Positive tenderness in the right midabdomen and right flank.). There is no rebound and no guarding.  Musculoskeletal: Normal range of motion. She exhibits no edema.  Lymphadenopathy:    She has no cervical adenopathy.  Neurological: She is alert and oriented to person, place, and time.  Skin: Skin is warm and dry. No rash noted.  Psychiatric: She has a normal mood and affect.    ED Course  Procedures (including critical care time)  Results for orders placed during the hospital encounter of 10/27/12  CBC WITH DIFFERENTIAL      Result Value Range   WBC 10.4  4.0 - 10.5 K/uL   RBC 4.35  3.87 - 5.11 MIL/uL   Hemoglobin 13.4  12.0 - 15.0 g/dL   HCT 40.9  81.1 - 91.4 %   MCV 86.4  78.0 - 100.0 fL   MCH 30.8  26.0 - 34.0 pg   MCHC 35.6  30.0 - 36.0 g/dL   RDW 78.2  95.6 - 21.3 %   Platelets 270  150 - 400 K/uL   Neutrophils Relative 75  43 - 77 %   Neutro Abs 7.7  1.7 - 7.7 K/uL   Lymphocytes Relative 19  12 - 46 %   Lymphs Abs 2.0  0.7 - 4.0 K/uL   Monocytes Relative 6  3 - 12 %   Monocytes Absolute 0.6  0.1 - 1.0 K/uL   Eosinophils Relative 0  0 - 5 %   Eosinophils Absolute 0.0  0.0 - 0.7 K/uL   Basophils Relative 0  0 - 1 %   Basophils Absolute 0.0  0.0 - 0.1 K/uL  COMPREHENSIVE METABOLIC PANEL      Result Value Range   Sodium 136  135 - 145 mEq/L   Potassium 3.6  3.5 - 5.1 mEq/L   Chloride 101  96 - 112 mEq/L   CO2 22  19 - 32 mEq/L   Glucose, Bld 92  70 - 99 mg/dL   BUN 10  6 - 23 mg/dL   Creatinine, Ser 0.86  0.50 - 1.10 mg/dL   Calcium 9.3  8.4 - 57.8 mg/dL   Total Protein 7.2  6.0 - 8.3 g/dL   Albumin 4.1  3.5 - 5.2 g/dL   AST 14  0 - 37 U/L   ALT 12  0 - 35 U/L   Alkaline Phosphatase  106  39 -  117 U/L   Total Bilirubin 0.6  0.3 - 1.2 mg/dL   GFR calc non Af Amer >90  >90 mL/min   GFR calc Af Amer >90  >90 mL/min   Ct Abdomen Pelvis Wo Contrast  10/27/2012  *RADIOLOGY REPORT*  Clinical Data: Right flank pain, hematuria  CT ABDOMEN AND PELVIS WITHOUT CONTRAST  Technique:  Multidetector CT imaging of the abdomen and pelvis was performed following the standard protocol without intravenous contrast.  Comparison: None.  Findings: Mild dependent atelectasis in the bilateral lower lobes.  Unenhanced liver, spleen, pancreas, and adrenal glands are within normal limits.  Possible layering sludge in the gallbladder.  No associated inflammatory changes.  No intrahepatic or extrahepatic ductal dilatation.  Mild to moderate right hydronephrosis.  6 mm proximal right ureteral calculus at the UPJ (coronal image 41).  Left kidney is within normal limits.  No evidence of bowel obstruction.  Normal appendix.  No evidence of abdominal aortic aneurysm.  No abdominopelvic ascites.  No suspicious bone pelvic lymphadenopathy.  Uterus is notable for an IUD in satisfactory position.  Bilateral ovaries unremarkable.  No distal ureteral or bladder calculi.  Bladder is within normal limits.  Visualized osseous structures are within normal limits.  IMPRESSION: 6 mm proximal right ureteral calculus at the UPJ.  Mild to moderate right hydronephrosis.   Original Report Authenticated By: Charline Bills, M.D.       1. Kidney stone       MDM  Patient has pain controlled after fentanyl. There is no evidence of a UTI and a urinalysis done at urgent care Center. She will be discharged with prescriptions for Percocet and Zofran. I discussed this with the urologist on call at [facility]. She is to call first thing Monday morning to schedule appointment with [facility]. Advised to return here if her pain is uncontrolled, she has ongoing vomiting or fevers.        Rolan Bucco, MD 10/27/12 [phone]

## 2012-10-27 NOTE — ED Provider Notes (Signed)
Patient Demographics  Kelly [name], is a 47 y.o. female  ZOX:[id number]  WJX:[id number]  DOB - Jan 12, 1966  Chief Complaint  Patient presents with  . Flank Pain        Subjective:   Kelly [name] today comes in after waking up this morning with severe right-sided flank pain which is constant with radiation to her right groin, pain is making her nauseated, no aggravating features, relieved by going in fetal position, she is now getting nauseated, no emesis thus far. No personal history of renal stones but father did have kidney stones, no fever chills or dysuria yet.  Objective:    Filed Vitals:   10/27/12 1316  BP: 137/82  Pulse: 98  Temp: 98.4 F (36.9 C)  TempSrc: Oral  Resp: 20  SpO2: 100%     Exam  Awake Alert, Oriented X 3, No new F.N deficits, Normal affect [place].AT,PERRAL Supple Neck,No JVD, No cervical lymphadenopathy appriciated.  Symmetrical Chest wall movement, Good air movement bilaterally, CTAB RRR,No Gallops,Rubs or new Murmurs, No Parasternal Heave +ve B.Sounds, Abd Soft, Non tender, No organomegaly appriciated, No rebound - guarding or rigidity. No Cyanosis, Clubbing or edema, No new Rash  No right-sided flank tenderness    Data Review   CBC No results found for this basename: WBC, HGB, HCT, PLT, MCV, MCH, MCHC, RDW, NEUTRABS, LYMPHSABS, MONOABS, EOSABS, BASOSABS, BANDABS, BANDSABD,  in the last 168 hours  Chemistries   No results found for this basename: NA, K, CL, CO2, GLUCOSE, BUN, CREATININE, GFRCGP, CALCIUM, MG, AST, ALT, ALKPHOS, BILITOT,  in the last 168 hours ------------------------------------------------------------------------------------------------------------------ No results found for this basename: HGBA1C,  in the last 72 hours ------------------------------------------------------------------------------------------------------------------ No results found for this basename: CHOL, HDL, LDLCALC, TRIG, CHOLHDL, LDLDIRECT,  in the last  72 hours ------------------------------------------------------------------------------------------------------------------ No results found for this basename: TSH, T4TOTAL, FREET3, T3FREE, THYROIDAB,  in the last 72 hours ------------------------------------------------------------------------------------------------------------------ No results found for this basename: VITAMINB12, FOLATE, FERRITIN, TIBC, IRON, RETICCTPCT,  in the last 72 hours  Coagulation profile  No results found for this basename: INR, PROTIME,  in the last 168 hours     Prior to Admission medications   Medication Sig Start Date End Date Taking? Authorizing Provider  atorvastatin (LIPITOR) 20 MG tablet Take 20 mg by mouth daily.      Historical Provider, MD  levonorgestrel (MIRENA) 20 MCG/24HR IUD 1 each by Intrauterine route once.    Historical Provider, MD  phentermine 37.5 MG capsule Take 1 capsule (37.5 mg total) by mouth every morning. 08/31/12 11/26/12  Ok Edwards, MD     Assessment & Plan   Right-sided renal colic likely a right ureteric stone, urine has large amounts of blood, she urine pregnancy test point-of-care is negative, patient will be given Toradol IM shot along with Zofran by mouth, will be transported to [facility] for further workup patient most likely will require IV fluids and overnight observation with urology consultation.   Leroy Sea M.D on 10/27/2012 at 1:35 PM  Leroy Sea, MD 10/27/12 1336

## 2012-10-27 NOTE — ED Notes (Signed)
Pt provided with urine strainer for home use with instructions for use

## 2012-10-27 NOTE — ED Notes (Signed)
Pain in right lower back , radiating into the right front.  Symptoms onset this am.

## 2012-10-27 NOTE — ED Notes (Signed)
Pt is an [facility] transfer for evaluation of kidney stone.  Pt woke this morning with c/o Right flank pain.

## 2012-10-28 ENCOUNTER — Emergency Department (HOSPITAL_COMMUNITY)
Admission: EM | Admit: 2012-10-28 | Discharge: 2012-10-28 | Disposition: A | Discharge status: Home / Self Care | Payer: 59 | Attending: Emergency Medicine | Admitting: Emergency Medicine

## 2012-10-28 ENCOUNTER — Emergency Department (HOSPITAL_COMMUNITY): Payer: 59

## 2012-10-28 ENCOUNTER — Encounter (HOSPITAL_COMMUNITY): Payer: Self-pay | Admitting: Cardiology

## 2012-10-28 DIAGNOSIS — R319 Hematuria, unspecified: Secondary | ICD-10-CM | POA: Insufficient documentation

## 2012-10-28 DIAGNOSIS — I10 Essential (primary) hypertension: Secondary | ICD-10-CM | POA: Insufficient documentation

## 2012-10-28 DIAGNOSIS — E785 Hyperlipidemia, unspecified: Secondary | ICD-10-CM | POA: Insufficient documentation

## 2012-10-28 DIAGNOSIS — N200 Calculus of kidney: Secondary | ICD-10-CM

## 2012-10-28 DIAGNOSIS — R079 Chest pain, unspecified: Secondary | ICD-10-CM

## 2012-10-28 DIAGNOSIS — Z79899 Other long term (current) drug therapy: Secondary | ICD-10-CM | POA: Insufficient documentation

## 2012-10-28 DIAGNOSIS — R109 Unspecified abdominal pain: Secondary | ICD-10-CM | POA: Insufficient documentation

## 2012-10-28 DIAGNOSIS — R0789 Other chest pain: Secondary | ICD-10-CM | POA: Insufficient documentation

## 2012-10-28 DIAGNOSIS — Z8742 Personal history of other diseases of the female genital tract: Secondary | ICD-10-CM | POA: Insufficient documentation

## 2012-10-28 DIAGNOSIS — M549 Dorsalgia, unspecified: Secondary | ICD-10-CM | POA: Insufficient documentation

## 2012-10-28 DIAGNOSIS — F17200 Nicotine dependence, unspecified, uncomplicated: Secondary | ICD-10-CM | POA: Insufficient documentation

## 2012-10-28 DIAGNOSIS — R112 Nausea with vomiting, unspecified: Secondary | ICD-10-CM

## 2012-10-28 HISTORY — DX: Calculus of kidney: N20.0

## 2012-10-28 MED ORDER — ONDANSETRON HCL 4 MG/2ML IJ SOLN
4.0000 mg | Freq: Once | INTRAMUSCULAR | Status: AC
  Administered 2012-10-28: 4 mg via INTRAVENOUS
  Filled 2012-10-28: qty 2

## 2012-10-28 MED ORDER — GI COCKTAIL ~~LOC~~
30.0000 mL | Freq: Once | ORAL | Status: AC
  Administered 2012-10-28: 30 mL via ORAL
  Filled 2012-10-28: qty 30

## 2012-10-28 MED ORDER — HYDROMORPHONE HCL PF 1 MG/ML IJ SOLN
0.5000 mg | Freq: Once | INTRAMUSCULAR | Status: AC
  Administered 2012-10-28: 0.5 mg via INTRAVENOUS
  Filled 2012-10-28: qty 1

## 2012-10-28 MED ORDER — TAMSULOSIN HCL 0.4 MG PO CAPS: 0.4000 mg | ORAL_CAPSULE | Freq: Every day | ORAL | Status: DC

## 2012-10-28 MED ORDER — OXYCODONE-ACETAMINOPHEN 5-325 MG PO TABS: ORAL_TABLET | ORAL | Status: DC

## 2012-10-28 NOTE — Discharge Instructions (Signed)
 Ureteral Colic (Kidney Stones) Ureteral colic is the result of a condition when kidney stones form inside the kidney. Once kidney stones are formed they may move into the tube that connects the kidney with the bladder (ureter). If this occurs, this condition may cause pain (colic) in the ureter.  CAUSES  Pain is caused by stone movement in the ureter and the obstruction caused by the stone. SYMPTOMS  The pain comes and goes as the ureter contracts around the stone. The pain is usually intense, sharp, and stabbing in character. The location of the pain may move as the stone moves through the ureter. When the stone is near the kidney the pain is usually located in the back and radiates to the belly (abdomen). When the stone is ready to pass into the bladder the pain is often located in the lower abdomen on the side the stone is located. At this location, the symptoms may mimic those of a urinary tract infection with urinary frequency. Once the stone is located here it often passes into the bladder and the pain disappears completely. TREATMENT   Your caregiver will provide you with medicine for pain relief.  You may require specialized follow-up X-rays.  The absence of pain does not always mean that the stone has passed. It may have just stopped moving. If the urine remains completely obstructed, it can cause loss of kidney function or even complete destruction of the involved kidney. It is your responsibility and in your interest that X-rays and follow-ups as suggested by your caregiver are completed. Relief of pain without passage of the stone can be associated with severe damage to the kidney, including loss of kidney function on that side.  If your stone does not pass on its own, additional measures may be taken by your caregiver to ensure its removal. HOME CARE INSTRUCTIONS   Increase your fluid intake. Water  is the preferred fluid since juices containing vitamin C may acidify the urine making it  less likely for certain stones (uric acid stones) to pass.  Strain all urine. A strainer will be provided. Keep all particulate matter or stones for your caregiver to inspect.  Take your pain medicine as directed.  Make a follow-up appointment with your caregiver as directed.  Remember that the goal is passage of your stone. The absence of pain does not mean the stone is gone. Follow your caregiver's instructions.  Only take over-the-counter or prescription medicines for pain, discomfort, or fever as directed by your caregiver. SEEK MEDICAL CARE IF:   Pain cannot be controlled with the prescribed medicine.  You have a fever.  Pain continues for longer than your caregiver advises it should.  There is a change in the pain, and you develop chest discomfort or constant abdominal pain.  You feel faint or pass out. MAKE SURE YOU:   Understand these instructions.  Will watch your condition.  Will get help right away if you are not doing well or get worse. Document Released: 05/18/2005 Document Revised: 10/31/2011 Document Reviewed: 02/02/2011 [facility] Patient Information 2013 [place], [place].    Chest Pain (Nonspecific) It is often hard to give a specific diagnosis for the cause of chest pain. There is always a chance that your pain could be related to something serious, such as a heart attack or a blood clot in the lungs. You need to follow up with your caregiver for further evaluation. CAUSES   Heartburn.  Pneumonia or bronchitis.  Anxiety or stress.  Inflammation around your  heart (pericarditis) or lung (pleuritis or pleurisy).  A blood clot in the lung.  A collapsed lung (pneumothorax). It can develop suddenly on its own (spontaneous pneumothorax) or from injury (trauma) to the chest.  Shingles infection (herpes zoster virus). The chest wall is composed of bones, muscles, and cartilage. Any of these can be the source of the pain.  The bones can be bruised by  injury.  The muscles or cartilage can be strained by coughing or overwork.  The cartilage can be affected by inflammation and become sore (costochondritis). DIAGNOSIS  Lab tests or other studies, such as X-rays, electrocardiography, stress testing, or cardiac imaging, may be needed to find the cause of your pain.  TREATMENT   Treatment depends on what may be causing your chest pain. Treatment may include:  Acid blockers for heartburn.  Anti-inflammatory medicine.  Pain medicine for inflammatory conditions.  Antibiotics if an infection is present.  You may be advised to change lifestyle habits. This includes stopping smoking and avoiding alcohol, caffeine , and chocolate.  You may be advised to keep your head raised (elevated) when sleeping. This reduces the chance of acid going backward from your stomach into your esophagus.  Most of the time, nonspecific chest pain will improve within 2 to 3 days with rest and mild pain medicine. HOME CARE INSTRUCTIONS   If antibiotics were prescribed, take your antibiotics as directed. Finish them even if you start to feel better.  For the next few days, avoid physical activities that bring on chest pain. Continue physical activities as directed.  Do not smoke.  Avoid drinking alcohol.  Only take over-the-counter or prescription medicine for pain, discomfort, or fever as directed by your caregiver.  Follow your caregiver's suggestions for further testing if your chest pain does not go away.  Keep any follow-up appointments you made. If you do not go to an appointment, you could develop lasting (chronic) problems with pain. If there is any problem keeping an appointment, you must call to reschedule. SEEK MEDICAL CARE IF:   You think you are having problems from the medicine you are taking. Read your medicine instructions carefully.  Your chest pain does not go away, even after treatment.  You develop a rash with blisters on your  chest. SEEK IMMEDIATE MEDICAL CARE IF:   You have increased chest pain or pain that spreads to your arm, neck, jaw, back, or abdomen.  You develop shortness of breath, an increasing cough, or you are coughing up blood.  You have severe back or abdominal pain, feel nauseous, or vomit.  You develop severe weakness, fainting, or chills.  You have a fever. THIS IS AN EMERGENCY. Do not wait to see if the pain will go away. Get medical help at once. Call your local emergency services (911 in [place] Do not drive yourself to the hospital. MAKE SURE YOU:   Understand these instructions.  Will watch your condition.  Will get help right away if you are not doing well or get worse. Document Released: 05/18/2005 Document Revised: 10/31/2011 Document Reviewed: 03/13/2008 [facility] Patient Information 2013 [place], [place].

## 2012-10-28 NOTE — ED Provider Notes (Signed)
History     CSN: [id number]  Arrival date & time 10/28/12  9629   First MD Initiated Contact with Patient 10/28/12 [phone]      Chief Complaint  Patient presents with  . Chest Pain    (Consider location/radiation/quality/duration/timing/severity/associated sxs/prior treatment) HPI Comments: Patient with chest discomfort and back discomfort that woke early this morning. She was just seen in the emergency department last night and found to have a 6 mm kidney stone by CT scan. It was discussed with the on-call urologist and the patient is supposed to call the clinic on Monday for possible procedure. Patient has a family history of kidney stones but has never had one herself. She reports that she was at home and still having some discomfort and some mild nausea. She reports she had taken some medicine and tried to relax and soon afterward she did vomit. The quality of the pain was more of a pressure sensation. She denies history of coronary disease and denies diaphoresis, shortness of breath and no pleurisy. She denies any recent long distance travel and no lower extremity swelling or pain. 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 She reports a couple of hours after vomiting, currently in the emergency department her symptoms of chest discomfort and nausea have resolved. She reports her flank pain is starting to come back slightly. She is told not to take NSAIDs at home due to possible procedure and she was given prescriptions for Percocet and Zofran.  Patient is a 47 y.o. female presenting with chest pain. The history is provided by the patient, a relative and medical records.  Chest Pain Associated symptoms: back pain, nausea and vomiting   Associated symptoms: no abdominal pain, no cough, no fever, no palpitations and no shortness of breath     Past Medical History  Diagnosis Date  . Hyperlipidemia    . Infertility, female   . Anticardiolipin antibody positive   . Hypertension     diet controlled  . Kidney stone     Past Surgical History  Procedure Laterality Date  . Cesarean section      times three    Family History  Problem Relation Age of Onset  . Diabetes Father   . Hypertension Father   . Cancer Father     colon cancer  . Kidney Stones Father   . Heart disease Mother   . Breast cancer Maternal Grandmother     History  Substance Use Topics  . Smoking status: Current Every Day Smoker -- 0.50 packs/day    Types: Cigarettes  . Smokeless tobacco: Never Used  . Alcohol Use: No    OB History   Grav Para Term Preterm Abortions TAB SAB Ect Mult Living   4 3 3  1     3       Review of Systems  Constitutional: Negative for fever and chills.  Respiratory: Negative for cough and shortness of breath.   Cardiovascular: Positive for chest pain. Negative for palpitations and leg swelling.  Gastrointestinal: Positive for nausea and vomiting. Negative for abdominal pain.  Genitourinary: Positive for hematuria and flank pain.  Musculoskeletal: Positive for back pain.  Skin: Negative for rash.  All other systems reviewed and are negative.    Allergies  Imitrex; Zomig; Avelox; Morphine; and Septra  Home Medications   Current Outpatient Rx  Name  Route  Sig  Dispense  Refill  . atorvastatin (LIPITOR)  20 MG tablet   Oral   Take 20 mg by mouth daily.           Marland Kitchen levonorgestrel (MIRENA) 20 MCG/24HR IUD   Intrauterine   1 each by Intrauterine route once.         . Multiple Vitamin (MULTIVITAMIN WITH MINERALS) TABS   Oral   Take 1 tablet by mouth daily.         . ondansetron (ZOFRAN-ODT) 8 MG disintegrating tablet   Oral   Take 8 mg by mouth every 8 (eight) hours as needed for nausea.         Marland Kitchen oxyCODONE-acetaminophen (PERCOCET) 5-325 MG per tablet   Oral   Take 2 tablets by mouth every 4 (four) hours as needed for pain.   20 tablet   0   .  phentermine 37.5 MG capsule   Oral   Take 1 capsule (37.5 mg total) by mouth every morning.   30 capsule   2   . oxyCODONE-acetaminophen (PERCOCET/ROXICET) 5-325 MG per tablet      1-2 tablets po q 6 hours prn moderate to severe pain   15 tablet   0   . tamsulosin (FLOMAX) 0.4 MG CAPS   Oral   Take 1 capsule (0.4 mg total) by mouth daily after supper.   14 capsule   0     BP 106/62  Pulse 80  Temp(Src) 98.4 F (36.9 C) (Oral)  Resp 16  SpO2 100%  LMP 08/03/2012  Physical Exam  Nursing note and vitals reviewed. Constitutional: She is oriented to person, place, and time. She appears well-developed and well-nourished.  HENT:  Head: Normocephalic and atraumatic.  Eyes: No scleral icterus.  Neck: Normal range of motion. Neck supple. No JVD present. No tracheal deviation present.  Cardiovascular: Normal rate and regular rhythm.   No murmur heard. Pulmonary/Chest: Effort normal. No stridor. No respiratory distress. She has no wheezes. She has no rales.  Abdominal: Soft. She exhibits no distension. There is no tenderness.  Musculoskeletal: She exhibits no edema and no tenderness.  Neurological: She is alert and oriented to person, place, and time.  Skin: Skin is warm and dry. No rash noted.    ED Course  Procedures (including critical care time)  Labs Reviewed - No data to display Ct Abdomen Pelvis Wo Contrast  10/27/2012  *RADIOLOGY REPORT*  Clinical Data: Right flank pain, hematuria  CT ABDOMEN AND PELVIS WITHOUT CONTRAST  Technique:  Multidetector CT imaging of the abdomen and pelvis was performed following the standard protocol without intravenous contrast.  Comparison: None.  Findings: Mild dependent atelectasis in the bilateral lower lobes.  Unenhanced liver, spleen, pancreas, and adrenal glands are within normal limits.  Possible layering sludge in the gallbladder.  No associated inflammatory changes.  No intrahepatic or extrahepatic ductal dilatation.  Mild to moderate  right hydronephrosis.  6 mm proximal right ureteral calculus at the UPJ (coronal image 41).  Left kidney is within normal limits.  No evidence of bowel obstruction.  Normal appendix.  No evidence of abdominal aortic aneurysm.  No abdominopelvic ascites.  No suspicious bone pelvic lymphadenopathy.  Uterus is notable for an IUD in satisfactory position.  Bilateral ovaries unremarkable.  No distal ureteral or bladder calculi.  Bladder is within normal limits.  Visualized osseous structures are within normal limits.  IMPRESSION: 6 mm proximal right ureteral calculus at the UPJ.  Mild to moderate right hydronephrosis.   Original Report Authenticated By: Charline Bills, M.D.  Dg Chest 2 View  10/28/2012  *RADIOLOGY REPORT*  Clinical Data: Chest pain  CHEST - 2 VIEW  Comparison: 09/26/2004  Findings: Lungs are clear. No pleural effusion or pneumothorax.  Cardiomediastinal silhouette is within normal limits.  Visualized osseous structures are within normal limits.  IMPRESSION: No evidence of acute cardiopulmonary disease.   Original Report Authenticated By: Charline Bills, M.D.      1. Chest pain   2. Nausea and vomiting   3. Kidney stone     EKG performed at time 08:38, shows sinus rhythm at a rate of 78, normal axis, normal intervals, no ST or T-wave abnormalities.  Room air saturation is 97% I interpret this to be normal.  12:15 PM Pt continues to feel improved, no further CP, no SOB.  Vitals stable.  No N/V.    MDM  Pt's symptoms I suspect are more related to esophgeal issues considering she had been having n/V previously related to now diagnosed ureteral stone.  She had another episode of vomiting this AM, possibly related to the chest pressure if it was from esophageal pressure.  Since vomiting, she feels improved.  ECG shows no ischemia.  Doubtful she has both a ureteral stone new and a cardiac or aortic acute abn's simultaneously.  Will get CXR and give GI cocktail and observe briefly in the  ED.          Gavin Pound. Ghim, MD 10/28/12 1216

## 2012-10-28 NOTE — ED Notes (Signed)
Pt to department via EMS from home- pt reports substernal chest pain that radiates from her back into her chest that started this morning. Also with n/v, recently dx with kidney stone and has been taking her pain and nausea medication. 20g hand. 324 asa. 1 Sl nitro with no relief. Bp-112/48 Hr-86 RR-18

## 2012-10-29 ENCOUNTER — Encounter (HOSPITAL_BASED_OUTPATIENT_CLINIC_OR_DEPARTMENT_OTHER)
Admission: RE | Disposition: A | Discharge status: Home / Self Care | Payer: Self-pay | Source: Ambulatory Visit | Attending: Urology

## 2012-10-29 ENCOUNTER — Encounter (HOSPITAL_BASED_OUTPATIENT_CLINIC_OR_DEPARTMENT_OTHER): Payer: Self-pay | Admitting: Anesthesiology

## 2012-10-29 ENCOUNTER — Encounter (HOSPITAL_BASED_OUTPATIENT_CLINIC_OR_DEPARTMENT_OTHER): Payer: Self-pay | Admitting: *Deleted

## 2012-10-29 ENCOUNTER — Ambulatory Visit (HOSPITAL_BASED_OUTPATIENT_CLINIC_OR_DEPARTMENT_OTHER): Payer: 59 | Admitting: Anesthesiology

## 2012-10-29 ENCOUNTER — Ambulatory Visit (HOSPITAL_BASED_OUTPATIENT_CLINIC_OR_DEPARTMENT_OTHER)
Admission: RE | Admit: 2012-10-29 | Discharge: 2012-10-29 | Disposition: A | Discharge status: Home / Self Care | Payer: 59 | Source: Ambulatory Visit | Attending: Urology | Admitting: Urology

## 2012-10-29 ENCOUNTER — Other Ambulatory Visit: Payer: Self-pay | Admitting: Urology

## 2012-10-29 DIAGNOSIS — N201 Calculus of ureter: Secondary | ICD-10-CM | POA: Insufficient documentation

## 2012-10-29 DIAGNOSIS — I10 Essential (primary) hypertension: Secondary | ICD-10-CM | POA: Insufficient documentation

## 2012-10-29 DIAGNOSIS — K219 Gastro-esophageal reflux disease without esophagitis: Secondary | ICD-10-CM | POA: Insufficient documentation

## 2012-10-29 HISTORY — PX: CYSTOSCOPY W/ RETROGRADES: SHX1426

## 2012-10-29 LAB — URINE CULTURE: Colony Count: 9000

## 2012-10-29 SURGERY — CYSTOSCOPY, WITH RETROGRADE PYELOGRAM
Anesthesia: General | Site: Ureter | Laterality: Right | Wound class: Clean Contaminated

## 2012-10-29 MED ORDER — IOHEXOL 350 MG/ML SOLN
INTRAVENOUS | Status: DC | PRN
  Administered 2012-10-29: 6 mL

## 2012-10-29 MED ORDER — KETOROLAC TROMETHAMINE 30 MG/ML IJ SOLN
INTRAMUSCULAR | Status: DC | PRN
  Administered 2012-10-29: 15 mg via INTRAVENOUS

## 2012-10-29 MED ORDER — ONDANSETRON HCL 4 MG/2ML IJ SOLN
INTRAMUSCULAR | Status: DC | PRN
  Administered 2012-10-29: 4 mg via INTRAVENOUS

## 2012-10-29 MED ORDER — MIDAZOLAM HCL 5 MG/5ML IJ SOLN
INTRAMUSCULAR | Status: DC | PRN
  Administered 2012-10-29: 2 mg via INTRAVENOUS

## 2012-10-29 MED ORDER — OXYCODONE-ACETAMINOPHEN 5-325 MG PO TABS: 1.0000 | ORAL_TABLET | Freq: Four times a day (QID) | ORAL | Status: DC | PRN

## 2012-10-29 MED ORDER — FENTANYL CITRATE 0.05 MG/ML IJ SOLN
50.0000 ug | Freq: Once | INTRAMUSCULAR | Status: AC
  Administered 2012-10-29: 13:00:00 via INTRAVENOUS
  Filled 2012-10-29: qty 1

## 2012-10-29 MED ORDER — STERILE WATER FOR IRRIGATION IR SOLN
Status: DC | PRN
  Administered 2012-10-29: 3000 mL

## 2012-10-29 MED ORDER — LACTATED RINGERS IV SOLN
INTRAVENOUS | Status: DC
  Administered 2012-10-29 (×2): via INTRAVENOUS
  Filled 2012-10-29: qty 1000

## 2012-10-29 MED ORDER — CIPROFLOXACIN IN D5W 400 MG/200ML IV SOLN
400.0000 mg | Freq: Two times a day (BID) | INTRAVENOUS | Status: DC
  Administered 2012-10-29: 400 mg via INTRAVENOUS
  Filled 2012-10-29: qty 200

## 2012-10-29 MED ORDER — LIDOCAINE HCL (CARDIAC) 20 MG/ML IV SOLN
INTRAVENOUS | Status: DC | PRN
  Administered 2012-10-29: 60 mg via INTRAVENOUS

## 2012-10-29 MED ORDER — PROPOFOL 10 MG/ML IV BOLUS
INTRAVENOUS | Status: DC | PRN
  Administered 2012-10-29: 200 mg via INTRAVENOUS

## 2012-10-29 MED ORDER — ACETAMINOPHEN 10 MG/ML IV SOLN
INTRAVENOUS | Status: DC | PRN
  Administered 2012-10-29: 1000 mg via INTRAVENOUS

## 2012-10-29 MED ORDER — DEXAMETHASONE SODIUM PHOSPHATE 4 MG/ML IJ SOLN
INTRAMUSCULAR | Status: DC | PRN
  Administered 2012-10-29: 10 mg via INTRAVENOUS

## 2012-10-29 MED ORDER — METOCLOPRAMIDE HCL 5 MG/ML IJ SOLN
INTRAMUSCULAR | Status: DC | PRN
  Administered 2012-10-29: 10 mg via INTRAVENOUS

## 2012-10-29 MED ORDER — LACTATED RINGERS IV SOLN
INTRAVENOUS | Status: DC | PRN
  Administered 2012-10-29: 13:00:00 via INTRAVENOUS

## 2012-10-29 MED ORDER — PROMETHAZINE HCL 12.5 MG PO TABS: 25.0000 mg | ORAL_TABLET | Freq: Three times a day (TID) | ORAL | Status: DC | PRN

## 2012-10-29 SURGICAL SUPPLY — 27 items
ADAPTER CATH URET PLST 4-6FR (CATHETERS) ×1 IMPLANT
ADPR CATH URET STRL DISP 4-6FR (CATHETERS) ×1
BAG DRAIN URO-CYSTO SKYTR STRL (DRAIN) ×2 IMPLANT
BAG DRN UROCATH (DRAIN) ×1
CANISTER SUCT LVC 12 LTR MEDI- (MISCELLANEOUS) ×1 IMPLANT
CATH INTERMIT  6FR 70CM (CATHETERS) ×1 IMPLANT
CATH URET 5FR 28IN CONE TIP (BALLOONS)
CATH URET 5FR 28IN OPEN ENDED (CATHETERS) IMPLANT
CATH URET 5FR 70CM CONE TIP (BALLOONS) IMPLANT
CLOTH BEACON ORANGE TIMEOUT ST (SAFETY) ×2 IMPLANT
DRAPE CAMERA CLOSED 9X96 (DRAPES) ×2 IMPLANT
GLOVE BIO SURGEON STRL SZ 6 (GLOVE) ×1 IMPLANT
GLOVE BIO SURGEON STRL SZ 6.5 (GLOVE) ×1 IMPLANT
GLOVE BIO SURGEON STRL SZ7.5 (GLOVE) ×2 IMPLANT
GOWN STRL REIN XL XLG (GOWN DISPOSABLE) ×2 IMPLANT
GUIDEWIRE 0.038 PTFE COATED (WIRE) IMPLANT
GUIDEWIRE ANG ZIPWIRE 038X150 (WIRE) IMPLANT
GUIDEWIRE STR DUAL SENSOR (WIRE) ×1 IMPLANT
KIT BALLIN UROMAX 15FX10 (LABEL) IMPLANT
KIT BALLN UROMAX 15FX4 (MISCELLANEOUS) IMPLANT
KIT BALLN UROMAX 26 75X4 (MISCELLANEOUS)
NS IRRIG 500ML POUR BTL (IV SOLUTION) IMPLANT
PACK CYSTOSCOPY (CUSTOM PROCEDURE TRAY) ×2 IMPLANT
SET HIGH PRES BAL DIL (LABEL)
SHEATH URET ACCESS 12FR/35CM (UROLOGICAL SUPPLIES) IMPLANT
SHEATH URET ACCESS 12FR/55CM (UROLOGICAL SUPPLIES) IMPLANT
STENT URET 6FRX24 CONTOUR (STENTS) ×1 IMPLANT

## 2012-10-29 NOTE — Anesthesia Preprocedure Evaluation (Signed)
Anesthesia Evaluation  Patient identified by MRN, date of birth, ID band Patient awake    Reviewed: Allergy & Precautions, H&P , NPO status , Patient's Chart, lab work & pertinent test results  Airway Mallampati: II TM Distance: >3 FB Neck ROM: Full    Dental no notable dental hx.    Pulmonary neg pulmonary ROS,  breath sounds clear to auscultation  Pulmonary exam normal       Cardiovascular hypertension, negative cardio ROS  Rhythm:Regular Rate:Normal     Neuro/Psych negative neurological ROS  negative psych ROS   GI/Hepatic Neg liver ROS, GERD-  ,  Endo/Other  negative endocrine ROS  Renal/GU Renal diseaseRight ureteral stone.  negative genitourinary   Musculoskeletal negative musculoskeletal ROS (+)   Abdominal   Peds negative pediatric ROS (+)  Hematology negative hematology ROS (+)   Anesthesia Other Findings   Reproductive/Obstetrics Pregnancy test negative.                           Anesthesia Physical Anesthesia Plan  ASA: II  Anesthesia Plan: General   Post-op Pain Management:    Induction: Intravenous  Airway Management Planned: LMA  Additional Equipment:   Intra-op Plan:   Post-operative Plan: Extubation in OR  Informed Consent: I have reviewed the patients History and Physical, chart, labs and discussed the procedure including the risks, benefits and alternatives for the proposed anesthesia with the patient or authorized representative who has indicated his/her understanding and acceptance.   Dental advisory given  Plan Discussed with: CRNA  Anesthesia Plan Comments:         Anesthesia Quick Evaluation

## 2012-10-29 NOTE — H&P (Signed)
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 In the last few days, she has had right flank pain radiating to her right lower quadrant. It is moderate and at times severe in severity associated with nausea and vomiting. She thinks 1 Phenergan worked better than the prescription Zofran. She was sent home on Percocet, Zofran, and Flomax.  She has had some dark-colored urine but no dysuria or fever and she does not take daily blood thinners or aspirin. She said she had pain a long while ago but has not formally ever been diagnosed with a stone.   She normally voids every 2 hours with a good flow and occasionally gets a urinary tract infection.  She has no neurologic risk factors or symptoms. She has not had a hysterectomy. Bowel function is normal.   There is no other modifying factors or associated signs or symptoms. There is no other aggravating or relieving factors. The symptoms are waxing-and-waning and persisting.      Review of Systems Genitourinary, constitutional, eye, hematologic/lymphatic, cardiovascular, endocrine and psychiatric system(s) were reviewed and pertinent findings if present are noted.  Gastrointestinal: nausea and vomiting.  Integumentary: pruritus.  ENT: sinus problems.  Respiratory: shortness of breath and cough.  Musculoskeletal: back pain.  Neurological: headache.    Vitals Vital Signs [Data Includes: Last 1 Day]  10Mar2014 11:36AM  BMI Calculated: 28.17 BSA Calculated: 1.81 Height: 5 ft 4 in Weight: 165 lb  Blood Pressure: 108 / 74 Temperature: 99 F Heart Rate: 85  Physical Exam Constitutional: Well nourished and well developed . No acute distress.  ENT:. The ears and nose are normal in appearance.  Neck: The appearance of the neck is normal and no neck mass is present.  Pulmonary: No respiratory distress and normal respiratory rhythm and effort.  Cardiovascular: Heart rate and  rhythm are normal . No peripheral edema.  Abdomen: The abdomen is soft and nontender. No masses are palpated. No CVA tenderness. No hernias are palpable. No hepatosplenomegaly noted.  Lymphatics: The femoral and inguinal nodes are not enlarged or tender.  Skin: Normal skin turgor, no visible rash and no visible skin lesions.  Neuro/Psych:. Mood and affect are appropriate.   . Genitourinary:   [id number] [Recorded 10/29/12 11:53 AM EST by smacdiarmid]     Results/Data   Kelly [name] underwent a number of tests, which I personally reviewed.   Urinalysis: Positive bacteria. Urine sent for culture.     Review of Medical Records: I reviewed her medical records and she had been sent home on Percocet and Zofran. White blood count was 10.4. Her serum creatinine was 0.66. I reviewed the CT scan as well as the report. She had a 6 mm proximal right ureteral stone at the ureteropelvic junction associated with mild to moderate hydronephrosis.    Urine [Data Includes: Last 1 Day]   10Mar2014  COLOR YELLOW   APPEARANCE CLEAR   SPECIFIC GRAVITY 1.020   pH 6.0   GLUCOSE NEG mg/dL  BILIRUBIN NEG   KETONE NEG mg/dL  BLOOD LARGE   PROTEIN NEG mg/dL  UROBILINOGEN 0.2 mg/dL  NITRITE NEG   LEUKOCYTE ESTERASE TRACE   SQUAMOUS EPITHELIAL/HPF FEW   WBC 7-10 WBC/hpf  RBC 7-10 RBC/hpf  BACTERIA MODERATE   CRYSTALS NONE SEEN   CASTS NONE SEEN    Assessment Assessed  1. Urinary Calculus On The Right 592.9 2. Abdominal Pain 789.00  Plan Health Maintenance (V70.0)  1. UA With REFLEX  Done: 10Mar2014 11:25AM  Discussion/Summary   Kelly [name] is having right flank pain as well as abdominal discomfort. She has minimal frequency. She is not toxic. I ordered a KUB.   After a thorough review of the management options for the patient's condition the patient  elected to proceed with surgical therapy as noted above. We have discussed the potential benefits and risks of the procedure, side effects of  the proposed treatment, the likelihood of the patient achieving the goals of the procedure, and any potential problems that might occur during the procedure or recuperation. Informed consent has been obtained.

## 2012-10-29 NOTE — Op Note (Signed)
Preoperative diagnosis: Right ureteral stone Postoperative diagnosis: Right ureteral stone Surgery: Cystoscopy, right retrograde ureterogram and insertion of right ureteral stent Surgeon: Dr. Lorin Picket MacDiarmid  The patient has the above diagnoses and consented the above procedure. He was done for ongoing pain and nausea. Preoperative antibiotics were given.  73 [place] scope was utilized. Bladder mucosa and trigone were normal. Ureters were normal. Is no evidence of cystitis  Retrograde ureterogram: The position of the patient was AP lithotomy. C-arm was brought in place. Open-ended ureteral catheter was easily advanced 3 cm in the distal right ureter without a sensor wire. I gentle retrograde demonstrated little to no hydroureter and mild dilation of the renal pelvis. I did not see a filling defect in the ureter. I could not see the stone between L3 and L4 in the ureter on KUB  Sensor wire was easily placed curling up in the upper pole calyx. Open-ended ureteral catheter was removed. Without a string a 24 cm x 6 French double-J stent was easily inserted into the right renal pelvis curling in the renal pelvis and the bladder. X-ray was taken. Bladder was emptied. Patient taken to recover room.  The patient on a KUB this week and plan for ureteroscopy versus lithotripsy

## 2012-10-29 NOTE — Interval H&P Note (Signed)
History and Physical Interval Note:  10/29/2012 1:00 PM  Kelly [name]  has presented today for surgery, with the diagnosis of right ureteral stone  The various methods of treatment have been discussed with the patient and family. After consideration of risks, benefits and other options for treatment, the patient has consented to  Procedure(s) with comments: CYSTOSCOPY WITH RETROGRADE PYELOGRAM (N/A) - rt stent placement , rt retrograde and cysto  as a surgical intervention .  The patient's history has been reviewed, patient examined, no change in status, stable for surgery.  I have reviewed the patient's chart and labs.  Questions were answered to the patient's satisfaction.     MACDIARMID,SCOTT A

## 2012-10-29 NOTE — Transfer of Care (Signed)
Immediate Anesthesia Transfer of Care Note  Patient: Kelly [name]  Procedure(s) Performed: Procedure(s) (LRB): CYSTOSCOPY WITH RETROGRADE PYELOGRAM and stent placement (Right)  Patient Location: PACU  Anesthesia Type: General  Level of Consciousness:drowsy  Airway & Oxygen Therapy: Patient Spontanous Breathing and Patient connected to face mask oxygen  Post-op Assessment: Report given to PACU RN and Post -op Vital signs reviewed and stable  Post vital signs: Reviewed and stable  Complications: No apparent anesthesia complications

## 2012-10-29 NOTE — Anesthesia Procedure Notes (Signed)
Procedure Name: LMA Insertion Date/Time: 10/29/2012 1:30 PM Performed by: Norva Pavlov Pre-anesthesia Checklist: Patient identified, Emergency Drugs available, Suction available and Patient being monitored Patient Re-evaluated:Patient Re-evaluated prior to inductionOxygen Delivery Method: Circle System Utilized Preoxygenation: Pre-oxygenation with 100% oxygen Intubation Type: IV induction Ventilation: Mask ventilation without difficulty LMA: LMA with gastric port inserted LMA Size: 4.0 Number of attempts: 1 Placement Confirmation: positive ETCO2 Tube secured with: Tape Dental Injury: Teeth and Oropharynx as per pre-operative assessment

## 2012-10-29 NOTE — Progress Notes (Signed)
Dr Sherron Monday into see pt post void as ordered prior to D/C.

## 2012-10-30 ENCOUNTER — Encounter (HOSPITAL_COMMUNITY): Payer: Self-pay | Admitting: *Deleted

## 2012-10-30 ENCOUNTER — Encounter (HOSPITAL_COMMUNITY): Payer: Self-pay | Admitting: Pharmacy Technician

## 2012-10-30 MED ORDER — FENTANYL CITRATE 0.05 MG/ML IJ SOLN: 25.0000 ug | INTRAMUSCULAR | Status: DC | PRN

## 2012-10-30 MED ORDER — CIPROFLOXACIN IN D5W 400 MG/200ML IV SOLN: 400.0000 mg | INTRAVENOUS | Status: DC

## 2012-10-30 NOTE — Anesthesia Postprocedure Evaluation (Signed)
  Anesthesia Post-op Note  Patient: Kelly [name]  Procedure(s) Performed: Procedure(s) (LRB): CYSTOSCOPY WITH RETROGRADE PYELOGRAM and stent placement (Right)  Patient Location: PACU  Anesthesia Type: General  Level of Consciousness: awake and alert   Airway and Oxygen Therapy: Patient Spontanous Breathing  Post-op Pain: mild  Post-op Assessment: Post-op Vital signs reviewed, Patient's Cardiovascular Status Stable, Respiratory Function Stable, Patent Airway and No signs of Nausea or vomiting  Last Vitals:  Filed Vitals:   10/29/12 1550  BP: 100/70  Pulse: 75  Temp: 36.9 C  Resp: 20    Post-op Vital Signs: stable   Complications: No apparent anesthesia complications

## 2012-10-30 NOTE — Pre-Procedure Instructions (Signed)
Asked to bring blue folder the day of the procedure,insurance card,I.D. driver's license,wear comfortable clothing and have a driver for the day. Asked not to take Advil,Motrin,Ibuprofen,Aleve or any NSAIDS, Aspirin, or Toradol for 72 hours prior to procedure,  No vitamins or herbal medications 7 days prior to procedure. Instructed to take laxative per doctor's office instructions and eat a light dinner the evening before procedure.   To arrive at1415  for lithotripsy procedure.  

## 2012-10-31 ENCOUNTER — Other Ambulatory Visit: Payer: Self-pay | Admitting: Urology

## 2012-10-31 ENCOUNTER — Encounter (HOSPITAL_BASED_OUTPATIENT_CLINIC_OR_DEPARTMENT_OTHER): Payer: Self-pay | Admitting: Urology

## 2012-11-01 ENCOUNTER — Encounter (HOSPITAL_COMMUNITY)
Admission: RE | Disposition: A | Discharge status: Home / Self Care | Payer: Self-pay | Source: Ambulatory Visit | Attending: Urology

## 2012-11-01 ENCOUNTER — Ambulatory Visit (HOSPITAL_COMMUNITY): Payer: 59

## 2012-11-01 ENCOUNTER — Encounter (HOSPITAL_COMMUNITY): Payer: Self-pay | Admitting: *Deleted

## 2012-11-01 ENCOUNTER — Ambulatory Visit (HOSPITAL_COMMUNITY)
Admission: RE | Admit: 2012-11-01 | Discharge: 2012-11-01 | Disposition: A | Discharge status: Home / Self Care | Payer: 59 | Source: Ambulatory Visit | Attending: Urology | Admitting: Urology

## 2012-11-01 DIAGNOSIS — Z79899 Other long term (current) drug therapy: Secondary | ICD-10-CM | POA: Insufficient documentation

## 2012-11-01 DIAGNOSIS — R350 Frequency of micturition: Secondary | ICD-10-CM | POA: Insufficient documentation

## 2012-11-01 DIAGNOSIS — N201 Calculus of ureter: Secondary | ICD-10-CM | POA: Insufficient documentation

## 2012-11-01 SURGERY — LITHOTRIPSY, ESWL
Anesthesia: LOCAL | Laterality: Right

## 2012-11-01 MED ORDER — DIAZEPAM 5 MG PO TABS
10.0000 mg | ORAL_TABLET | ORAL | Status: AC
  Administered 2012-11-01: 10 mg via ORAL
  Filled 2012-11-01: qty 2

## 2012-11-01 MED ORDER — DIPHENHYDRAMINE HCL 25 MG PO CAPS
25.0000 mg | ORAL_CAPSULE | ORAL | Status: AC
  Administered 2012-11-01: 25 mg via ORAL
  Filled 2012-11-01: qty 1

## 2012-11-01 MED ORDER — CIPROFLOXACIN HCL 500 MG PO TABS
500.0000 mg | ORAL_TABLET | ORAL | Status: AC
  Administered 2012-11-01: 500 mg via ORAL
  Filled 2012-11-01: qty 1

## 2012-11-01 MED ORDER — DEXTROSE-NACL 5-0.45 % IV SOLN
INTRAVENOUS | Status: DC
  Administered 2012-11-01: 15:00:00 via INTRAVENOUS

## 2012-11-01 NOTE — H&P (Signed)
History of Present Illness   Kelly [name] had a stent a few days ago. She had a little bit of vague back pressure or discomfort likely from the stent but the pain and nausea from the stone is gone. She has been straining her urine and not seeing any sand or gravel. She has some urgency and trying Ditropan.   Review of Systems: No change in bowel or neurologic systems.   I reviewed the KUB n detail and interfaced again with a CT scan.  I explained to Kelly [name] that when I did the retrograde I reflected it and I did not see a filling defect in the area of the stone. There was no stone in her bladder. Theoretically she may have passed a stone but there is a 95% chance at least that she still has it. Having said that, it would be nice if we did not have to do a procedure and she was stone free.  We decided to go ahead with a CT urogram today to see if the stone is still present.   I reviewed cystoscopy, retrograde ureterogram and ___ stent, and ureteroscopy in detail. I went over multiple risks including injury and sequelae, success and failure rates and sequelae, bleeding, infection, and the need for a postoperative stent.    Current Meds 1. Cipro TABS; Therapy: (Recorded:12Mar2014) to 2. Flomax 0.4 MG Oral Capsule; Therapy: (Recorded:12Mar2014) to 3. Oxybutynin Chloride ER 5 MG Oral Tablet Extended Release 24 Hour; 1-3 tablets daily, as  needed; Therapy: 11Mar2014 to (Last Rx:11Mar2014)  Requested for: 11Mar2014 4. Percocet 5-325 MG Oral Tablet; Therapy: (Recorded:12Mar2014) to 5. Promethazine HCl 12.5 MG Oral Tablet; Therapy: (Recorded:12Mar2014) to  Allergies Medication  1. Avelox TABS 2. Imitrex TABS 3. Morphine Sulfate TABS 4. Septra TABS 5. Zomig TABS  Social History Problems  1. Never A Smoker  Vitals Vital Signs [Data Includes: Last 1 Day]  12Mar2014 02:07PM  Blood Pressure: 123 / 83 Temperature: 99.2 F Heart Rate: 80  Assessment Assessed  1. Urinary Calculus On The  Right 592.9 2. Urinary Frequency 788.41  Plan   Discussion/Summary   Kelly [name] had a repeat CT scan. I reviewed it with the original CT scan and today's KUB and the stone is now in the lower pole of the right kidney. It is quite vague. It is at the bottom at L3.   A picture was drawn. I talked about removing the stent and trying to pass the stone and treat it with watchful waiting but I did not recommend this. I talked about lithotripsy.   We talked about ESWL in detail. Pros, cons, general surgical and anesthetic risks, and other options including watchful waiting and ureteroscopy were discussed. Success and failure rates and need for further/repeat therapy were discussed. Risks were described but not limited to pain, infection, sepsis, and bleeding. The risk of renal and ureteral trauma with short and long term sequelae was discussed. The risk of injury to adjacent structures was discussed. The risk of needing a stent post-ESWL was discussed.  She would like to have this done tomorrow.   We will proceed accordingly. She understands there is a lower success rate with dependent stones.   After a thorough review of the management options for the patient's condition the patient  elected to proceed with surgical therapy as noted above. We have discussed the potential benefits and risks of the procedure, side effects of the proposed treatment, the likelihood of the patient achieving the goals of the procedure,  and any potential problems that might occur during the procedure or recuperation. Informed consent has been obtained.

## 2012-11-01 NOTE — Progress Notes (Addendum)
Questioned pt about her allergy to cipro (she is allergic to septra). She states she is on cipro now with no problems. RN released order for cipro.  1800  Right flank is reddened without breakdown s/p ESWL.

## 2012-11-01 NOTE — Interval H&P Note (Signed)
History and Physical Interval Note:  11/01/2012 11:05 AM  Kelly [name]  has presented today for surgery, with the diagnosis of right ureteral stone  The various methods of treatment have been discussed with the patient and family. After consideration of risks, benefits and other options for treatment, the patient has consented to  Procedure(s): right EXTRACORPOREAL SHOCK WAVE LITHOTRIPSY (ESWL) (Right) as a surgical intervention .  The patient's history has been reviewed, patient examined, no change in status, stable for surgery.  I have reviewed the patient's chart and labs.  Questions were answered to the patient's satisfaction.     MACDIARMID,SCOTT A

## 2012-11-09 ENCOUNTER — Other Ambulatory Visit: Payer: Self-pay | Admitting: Surgery

## 2012-11-16 ENCOUNTER — Other Ambulatory Visit: Payer: Self-pay | Admitting: Urology

## 2012-11-19 ENCOUNTER — Other Ambulatory Visit: Payer: Self-pay | Admitting: Urology

## 2012-11-19 ENCOUNTER — Encounter (HOSPITAL_BASED_OUTPATIENT_CLINIC_OR_DEPARTMENT_OTHER): Payer: Self-pay | Admitting: *Deleted

## 2012-11-20 ENCOUNTER — Encounter (HOSPITAL_BASED_OUTPATIENT_CLINIC_OR_DEPARTMENT_OTHER): Payer: Self-pay | Admitting: *Deleted

## 2012-11-20 NOTE — Progress Notes (Addendum)
To [facility] at 0830-[id number]-[id number],urine pregnancy on arrival-Npo after Mn-may take pain med with small amt water If needed that am,refrain from smoking.

## 2012-11-23 ENCOUNTER — Encounter (HOSPITAL_BASED_OUTPATIENT_CLINIC_OR_DEPARTMENT_OTHER): Payer: Self-pay | Admitting: Anesthesiology

## 2012-11-23 ENCOUNTER — Ambulatory Visit (HOSPITAL_BASED_OUTPATIENT_CLINIC_OR_DEPARTMENT_OTHER): Payer: 59 | Admitting: Anesthesiology

## 2012-11-23 ENCOUNTER — Ambulatory Visit (HOSPITAL_BASED_OUTPATIENT_CLINIC_OR_DEPARTMENT_OTHER)
Admission: RE | Admit: 2012-11-23 | Discharge: 2012-11-23 | Disposition: A | Discharge status: Home / Self Care | Payer: 59 | Source: Ambulatory Visit | Attending: Urology | Admitting: Urology

## 2012-11-23 ENCOUNTER — Encounter (HOSPITAL_BASED_OUTPATIENT_CLINIC_OR_DEPARTMENT_OTHER)
Admission: RE | Disposition: A | Discharge status: Home / Self Care | Payer: Self-pay | Source: Ambulatory Visit | Attending: Urology

## 2012-11-23 ENCOUNTER — Encounter (HOSPITAL_BASED_OUTPATIENT_CLINIC_OR_DEPARTMENT_OTHER): Payer: Self-pay | Admitting: *Deleted

## 2012-11-23 DIAGNOSIS — E785 Hyperlipidemia, unspecified: Secondary | ICD-10-CM | POA: Insufficient documentation

## 2012-11-23 DIAGNOSIS — N200 Calculus of kidney: Secondary | ICD-10-CM | POA: Insufficient documentation

## 2012-11-23 DIAGNOSIS — I10 Essential (primary) hypertension: Secondary | ICD-10-CM | POA: Insufficient documentation

## 2012-11-23 DIAGNOSIS — F17200 Nicotine dependence, unspecified, uncomplicated: Secondary | ICD-10-CM | POA: Insufficient documentation

## 2012-11-23 DIAGNOSIS — K219 Gastro-esophageal reflux disease without esophagitis: Secondary | ICD-10-CM | POA: Insufficient documentation

## 2012-11-23 HISTORY — PX: CYSTOSCOPY W/ URETERAL STENT PLACEMENT: SHX1429

## 2012-11-23 HISTORY — PX: CYSTOSCOPY/RETROGRADE/URETEROSCOPY/STONE EXTRACTION WITH BASKET: SHX5317

## 2012-11-23 HISTORY — DX: Calculus of kidney: N20.0

## 2012-11-23 LAB — POCT I-STAT, CHEM 8
BUN: 13 mg/dL (ref 6–23)
Calcium, Ion: 1.24 mmol/L — ABNORMAL HIGH (ref 1.12–1.23)
Chloride: 108 mEq/L (ref 96–112)
Creatinine, Ser: 0.7 mg/dL (ref 0.50–1.10)
Glucose, Bld: 87 mg/dL (ref 70–99)
HCT: 38 % (ref 36.0–46.0)
Hemoglobin: 12.9 g/dL (ref 12.0–15.0)
Potassium: 3.9 mEq/L (ref 3.5–5.1)
Sodium: 141 mEq/L (ref 135–145)
TCO2: 25 mmol/L (ref 0–100)

## 2012-11-23 LAB — POCT PREGNANCY, URINE: Preg Test, Ur: NEGATIVE

## 2012-11-23 SURGERY — CYSTOSCOPY, WITH CALCULUS REMOVAL USING BASKET
Anesthesia: General | Site: Ureter | Laterality: Right | Wound class: Clean Contaminated

## 2012-11-23 MED ORDER — ONDANSETRON HCL 4 MG/2ML IJ SOLN
INTRAMUSCULAR | Status: DC | PRN
  Administered 2012-11-23: 4 mg via INTRAVENOUS

## 2012-11-23 MED ORDER — LACTATED RINGERS IV SOLN
INTRAVENOUS | Status: DC
  Administered 2012-11-23 (×2): via INTRAVENOUS
  Administered 2012-11-23: 100 mL/h via INTRAVENOUS
  Filled 2012-11-23: qty 1000

## 2012-11-23 MED ORDER — OXYCODONE HCL 5 MG PO TABS
5.0000 mg | ORAL_TABLET | Freq: Once | ORAL | Status: DC | PRN
  Filled 2012-11-23: qty 1

## 2012-11-23 MED ORDER — SENNA-DOCUSATE SODIUM 8.6-50 MG PO TABS: 1.0000 | ORAL_TABLET | Freq: Every day | ORAL | Status: DC

## 2012-11-23 MED ORDER — SODIUM CHLORIDE 0.9 % IR SOLN
Status: DC | PRN
  Administered 2012-11-23: 6000 mL

## 2012-11-23 MED ORDER — PROMETHAZINE HCL 12.5 MG PO TABS: 25.0000 mg | ORAL_TABLET | Freq: Three times a day (TID) | ORAL | Status: DC | PRN

## 2012-11-23 MED ORDER — PROMETHAZINE HCL 25 MG/ML IJ SOLN
6.2500 mg | INTRAMUSCULAR | Status: DC | PRN
  Filled 2012-11-23: qty 1

## 2012-11-23 MED ORDER — MEPERIDINE HCL 25 MG/ML IJ SOLN
6.2500 mg | INTRAMUSCULAR | Status: DC | PRN
  Filled 2012-11-23: qty 1

## 2012-11-23 MED ORDER — OXYCODONE-ACETAMINOPHEN 5-325 MG PO TABS: 1.0000 | ORAL_TABLET | ORAL | Status: DC | PRN

## 2012-11-23 MED ORDER — ACETAMINOPHEN 10 MG/ML IV SOLN
1000.0000 mg | Freq: Once | INTRAVENOUS | Status: DC | PRN
  Filled 2012-11-23: qty 100

## 2012-11-23 MED ORDER — LIDOCAINE HCL (CARDIAC) 20 MG/ML IV SOLN
INTRAVENOUS | Status: DC | PRN
  Administered 2012-11-23: 100 mg via INTRAVENOUS

## 2012-11-23 MED ORDER — FENTANYL CITRATE 0.05 MG/ML IJ SOLN
INTRAMUSCULAR | Status: DC | PRN
  Administered 2012-11-23 (×3): 25 ug via INTRAVENOUS

## 2012-11-23 MED ORDER — OXYCODONE HCL 5 MG/5ML PO SOLN
5.0000 mg | Freq: Once | ORAL | Status: DC | PRN
  Filled 2012-11-23: qty 5

## 2012-11-23 MED ORDER — IOHEXOL 350 MG/ML SOLN
INTRAVENOUS | Status: DC | PRN
  Administered 2012-11-23: 9.5 mL via INTRAVENOUS

## 2012-11-23 MED ORDER — GENTAMICIN IN SALINE 1.6-0.9 MG/ML-% IV SOLN
80.0000 mg | INTRAVENOUS | Status: DC
  Filled 2012-11-23: qty 50

## 2012-11-23 MED ORDER — DEXAMETHASONE SODIUM PHOSPHATE 4 MG/ML IJ SOLN
INTRAMUSCULAR | Status: DC | PRN
  Administered 2012-11-23: 10 mg via INTRAVENOUS

## 2012-11-23 MED ORDER — GENTAMICIN SULFATE 40 MG/ML IJ SOLN
380.0000 mg | INTRAVENOUS | Status: AC
  Administered 2012-11-23: 380 mg via INTRAVENOUS
  Filled 2012-11-23: qty 9.5

## 2012-11-23 MED ORDER — PROPOFOL 10 MG/ML IV BOLUS
INTRAVENOUS | Status: DC | PRN
  Administered 2012-11-23: 200 mg via INTRAVENOUS

## 2012-11-23 MED ORDER — HYDROMORPHONE HCL PF 1 MG/ML IJ SOLN
0.2500 mg | INTRAMUSCULAR | Status: DC | PRN
  Administered 2012-11-23 (×2): 0.5 mg via INTRAVENOUS
  Filled 2012-11-23: qty 1

## 2012-11-23 MED ORDER — IOHEXOL 300 MG/ML  SOLN: INTRAMUSCULAR | Status: DC | PRN

## 2012-11-23 MED ORDER — VANCOMYCIN HCL IN DEXTROSE 1-5 GM/200ML-% IV SOLN
1000.0000 mg | Freq: Once | INTRAVENOUS | Status: AC
  Administered 2012-11-23: 1000 mg via INTRAVENOUS
  Filled 2012-11-23: qty 200

## 2012-11-23 MED ORDER — MIDAZOLAM HCL 5 MG/5ML IJ SOLN
INTRAMUSCULAR | Status: DC | PRN
  Administered 2012-11-23: 2 mg via INTRAVENOUS

## 2012-11-23 SURGICAL SUPPLY — 44 items
ADAPTER CATH URET PLST 4-6FR (CATHETERS) IMPLANT
ADPR CATH URET STRL DISP 4-6FR (CATHETERS)
BAG DRAIN URO-CYSTO SKYTR STRL (DRAIN) ×3 IMPLANT
BAG DRN UROCATH (DRAIN) ×2
BAG URO CATCHER STRL LF (DRAPE) ×3 IMPLANT
BASKET LASER NITINOL 1.9FR (BASKET) ×3 IMPLANT
BASKET STNLS GEMINI 4WIRE 3FR (BASKET) IMPLANT
BASKET ZERO TIP NITINOL 2.4FR (BASKET) IMPLANT
BRUSH URET BIOPSY 3F (UROLOGICAL SUPPLIES) IMPLANT
BSKT STON RTRVL 120 1.9FR (BASKET) ×2
BSKT STON RTRVL GEM 120X11 3FR (BASKET)
BSKT STON RTRVL ZERO TP 2.4FR (BASKET)
CANISTER SUCT LVC 12 LTR MEDI- (MISCELLANEOUS) ×2 IMPLANT
CATH FOLEY 2WAY  3CC  8FR (CATHETERS) ×1
CATH FOLEY 2WAY 3CC 8FR (CATHETERS) ×1 IMPLANT
CATH INTERMIT  6FR 70CM (CATHETERS) IMPLANT
CATH URET 5FR 28IN CONE TIP (BALLOONS)
CATH URET 5FR 28IN OPEN ENDED (CATHETERS) ×2 IMPLANT
CATH URET 5FR 70CM CONE TIP (BALLOONS) IMPLANT
CLOTH BEACON ORANGE TIMEOUT ST (SAFETY) ×3 IMPLANT
DRAPE CAMERA CLOSED 9X96 (DRAPES) ×3 IMPLANT
ELECT REM PT RETURN 9FT ADLT (ELECTROSURGICAL)
ELECTRODE REM PT RTRN 9FT ADLT (ELECTROSURGICAL) IMPLANT
GLOVE BIO SURGEON STRL SZ7 (GLOVE) ×2 IMPLANT
GLOVE BIO SURGEON STRL SZ7.5 (GLOVE) ×3 IMPLANT
GLOVE INDICATOR 7.0 STRL GRN (GLOVE) ×4 IMPLANT
GOWN PREVENTION PLUS LG XLONG (DISPOSABLE) ×6 IMPLANT
GOWN STRL REIN XL XLG (GOWN DISPOSABLE) ×3 IMPLANT
GUIDEWIRE 0.038 PTFE COATED (WIRE) IMPLANT
GUIDEWIRE ANG ZIPWIRE 038X150 (WIRE) ×3 IMPLANT
GUIDEWIRE STR DUAL SENSOR (WIRE) ×3 IMPLANT
IV NS IRRIG 3000ML ARTHROMATIC (IV SOLUTION) ×6 IMPLANT
KIT BALLIN UROMAX 15FX10 (LABEL) IMPLANT
KIT BALLN UROMAX 15FX4 (MISCELLANEOUS) IMPLANT
KIT BALLN UROMAX 26 75X4 (MISCELLANEOUS)
PACK CYSTOSCOPY (CUSTOM PROCEDURE TRAY) ×3 IMPLANT
SET HIGH PRES BAL DIL (LABEL)
SHEATH ACCESS URETERAL 38CM (SHEATH) ×2 IMPLANT
SHEATH URET ACCESS 12FR/35CM (UROLOGICAL SUPPLIES) IMPLANT
SHEATH URET ACCESS 12FR/55CM (UROLOGICAL SUPPLIES) IMPLANT
STENT CONTOUR 6FRX24X.038 (STENTS) ×2 IMPLANT
SYRINGE 10CC LL (SYRINGE) ×3 IMPLANT
SYRINGE IRR TOOMEY STRL 70CC (SYRINGE) IMPLANT
TUBE FEEDING 8FR 16IN STR KANG (MISCELLANEOUS) IMPLANT

## 2012-11-23 NOTE — Brief Op Note (Signed)
11/23/2012  11:37 AM  PATIENT:  Kelly [name]  47 y.o. female  PRE-OPERATIVE DIAGNOSIS:  RIGHT RENAL STONE  POST-OPERATIVE DIAGNOSIS:  RIGHT RENAL STONE  PROCEDURE:  Procedure(s): CYSTOSCOPY/RETROGRADE/URETEROSCOPY/STONE EXTRACTION WITH BASKET (Right) CYSTOSCOPY WITH STENT REPLACEMENT (Right)  SURGEON:  Surgeon(s) and Role:    * Sebastian Ache, MD - Primary  PHYSICIAN ASSISTANT:   ASSISTANTS: none   ANESTHESIA:   general  EBL:     BLOOD ADMINISTERED:none  DRAINS: none   LOCAL MEDICATIONS USED:  NONE  SPECIMEN:  Source of Specimen:  Rt Renal Stone  DISPOSITION OF SPECIMEN:  Compositional Analysis, part given to pt  COUNTS:  YES  TOURNIQUET:  * No tourniquets in log *  DICTATION: .Other Dictation: Dictation Number [id number]  PLAN OF CARE: Discharge to home after PACU  PATIENT DISPOSITION:  PACU - hemodynamically stable.   Delay start of Pharmacological VTE agent (>24hrs) due to surgical blood loss or risk of bleeding: not applicable

## 2012-11-23 NOTE — Anesthesia Procedure Notes (Signed)
Procedure Name: LMA Insertion Date/Time: 11/23/2012 10:54 AM Performed by: Gar Gibbon Pre-anesthesia Checklist: Patient identified, Emergency Drugs available, Suction available and Patient being monitored Patient Re-evaluated:Patient Re-evaluated prior to inductionOxygen Delivery Method: Circle System Utilized Preoxygenation: Pre-oxygenation with 100% oxygen Intubation Type: IV induction Ventilation: Mask ventilation without difficulty LMA: LMA inserted LMA Size: 4.0 Number of attempts: 1 Airway Equipment and Method: bite block Placement Confirmation: positive ETCO2 Tube secured with: Tape Dental Injury: Teeth and Oropharynx as per pre-operative assessment

## 2012-11-23 NOTE — Anesthesia Postprocedure Evaluation (Signed)
Anesthesia Post Note  Patient: Kelly [name]  Procedure(s) Performed: Procedure(s) (LRB): CYSTOSCOPY/RETROGRADE/URETEROSCOPY/STONE EXTRACTION WITH BASKET (Right) CYSTOSCOPY WITH STENT REPLACEMENT (Right)  Anesthesia type: General  Patient location: PACU  Post pain: Pain level controlled  Post assessment: Post-op Vital signs reviewed  Last Vitals: BP 120/83  Pulse 73  Temp(Src) 36.4 C (Oral)  Resp 16  Ht 5\' 4"  (1.626 m)  Wt 167 lb (75.751 kg)  BMI 28.65 kg/m2  SpO2 100%  LMP 08/05/2012  Post vital signs: Reviewed  Level of consciousness: sedated  Complications: No apparent anesthesia complications

## 2012-11-23 NOTE — H&P (Signed)
Santana Gosdin Mackel is an 47 y.o. female.    Chief Complaint: Pre-Op Rt Ureteroscopic Stone Manipulation  HPI:     1 - Rt Renal Stone - Pt s/p Rt cysto JJ stent 3/10 and subsequent SWL for right proximal ureteral stone and severe flank pain. She has passed some small fragements, but has had persitance of lower pole 6mm fragment by CT and KUB. No contralateral stones. No prior episodes of colic.  PMH sig for cesarean x3. No CV disease. No strong blood thinners. 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   Today Dosha is seen to proceed with Rt URS to address her persistent rt lower pole stone. No interval fevers or stone passage. She has small growth of staph by UCX at office recently that was likely contaminant, most recent other CX's negative.   Past Medical History  Diagnosis Date  . Hyperlipidemia   . Anticardiolipin antibody positive   . Hypertension     diet controlled  . Renal calculus, right     Past Surgical History  Procedure Laterality Date  . Cesarean section      X3   LAST ONE 11-30-2004  . Cystoscopy w/ retrogrades Right 10/29/2012    Procedure: CYSTOSCOPY WITH RETROGRADE PYELOGRAM and stent placement;  Surgeon: Martina Sinner, MD;  Location: [facility] [place];  Service: Urology;  Laterality: Right;  rt stent placement , rt retrograde and cysto     Family History  Problem Relation Age of Onset  . Diabetes Father   . Hypertension Father   . Cancer Father     colon cancer  . Kidney Stones Father   . Heart disease Mother   . Breast cancer Maternal Grandmother    Social History:  reports that she has been smoking Cigarettes.  She has a 4 pack-year smoking history. She has never used smokeless tobacco. She reports that she does not drink alcohol or use illicit drugs.  Allergies:  Allergies  Allergen Reactions  . Imitrex (Sumatriptan Base) Anaphylaxis  . Zomig Anaphylaxis    Throat swelling   . Avelox (Moxifloxacin Hcl In Nacl) Hives   . Morphine Nausea And Vomiting  . Septra (Bactrim) Hives    No prescriptions prior to admission    No results found for this or any previous visit (from the past 48 hour(s)). No results found.  Review of Systems  Constitutional: Negative.  Negative for fever and chills.  HENT: Negative.   Eyes: Negative.   Respiratory: Negative.   Cardiovascular: Negative.   Gastrointestinal: Negative.   Genitourinary: Negative.   Musculoskeletal: Negative.   Skin: Negative.   Neurological: Negative.   Endo/Heme/Allergies: Negative.   Psychiatric/Behavioral: Negative.     Height 5\' 4"  (1.626 m), weight 75.751 kg (167 lb), last menstrual period 08/05/2012. Physical Exam  Constitutional: She is oriented to person, place, and time. She appears well-developed and well-nourished.  HENT:  Head: Normocephalic and atraumatic.  Eyes: EOM are normal. Pupils are equal, round, and reactive to light.  Neck: Normal range of motion. Neck supple.  Cardiovascular: Normal rate and regular rhythm.   Respiratory: Effort normal and breath sounds normal.  GI: Soft. Bowel sounds are normal.  Genitourinary:  No CVAT  Musculoskeletal: Normal range of motion.  Neurological: She is alert and oriented to person, place, and time.  Skin: Skin is warm and dry.  Psychiatric: She has a normal mood and affect. Her behavior is normal. Judgment and thought content normal.  Assessment/Plan   1 - Rt Renal Stone - We re-discussed ureteroscopic stone manipulation with basketing and laser-lithotripsy in detail.  We discussed risks including bleeding, infection, damage to kidney / ureter  bladder, rarely loss of kidney. We discussed anesthetic risks and rare but serious surgical complications including DVT, PE, MI, and mortality. We specifically addressed that in 5-10% of cases a staged approach is required with stenting followed by re-attempt ureteroscopy if anatomy unfavorable. The patient voiced understanding and wises to  proceed.      MANNY, THEODORE 11/23/2012, 6:14 AM

## 2012-11-23 NOTE — Transfer of Care (Signed)
Immediate Anesthesia Transfer of Care Note  Patient: Kelly [name]  Procedure(s) Performed: Procedure(s): CYSTOSCOPY/RETROGRADE/URETEROSCOPY/STONE EXTRACTION WITH BASKET (Right) CYSTOSCOPY WITH STENT REPLACEMENT (Right)  Patient Location: PACU  Anesthesia Type:General  Level of Consciousness: awake and patient cooperative  Airway & Oxygen Therapy: Patient Spontanous Breathing and Patient connected to face mask oxygen  Post-op Assessment: Report given to PACU RN and Post -op Vital signs reviewed and stable  Post vital signs: Reviewed and stable  Complications: No apparent anesthesia complications

## 2012-11-23 NOTE — Anesthesia Preprocedure Evaluation (Addendum)
Anesthesia Evaluation  Patient identified by MRN, date of birth, ID band Patient awake    Reviewed: Allergy & Precautions, H&P , NPO status , Patient's Chart, lab work & pertinent test results  Airway Mallampati: II TM Distance: >3 FB Neck ROM: Full    Dental no notable dental hx. (+) Dental Advisory Given   Pulmonary neg pulmonary ROS,  breath sounds clear to auscultation  Pulmonary exam normal       Cardiovascular hypertension, Rhythm:Regular Rate:Normal     Neuro/Psych negative neurological ROS  negative psych ROS   GI/Hepatic Neg liver ROS, GERD-  ,  Endo/Other  negative endocrine ROS  Renal/GU Right ureteral stone.     Musculoskeletal negative musculoskeletal ROS (+)   Abdominal   Peds  Hematology negative hematology ROS (+)   Anesthesia Other Findings   Reproductive/Obstetrics negative OB ROS Pregnancy test negative.                          Anesthesia Physical  Anesthesia Plan  ASA: II  Anesthesia Plan: General   Post-op Pain Management:    Induction: Intravenous  Airway Management Planned: LMA  Additional Equipment:   Intra-op Plan:   Post-operative Plan: Extubation in OR  Informed Consent: I have reviewed the patients History and Physical, chart, labs and discussed the procedure including the risks, benefits and alternatives for the proposed anesthesia with the patient or authorized representative who has indicated his/her understanding and acceptance.   Dental advisory given  Plan Discussed with: CRNA  Anesthesia Plan Comments:         Anesthesia Quick Evaluation

## 2012-11-24 NOTE — Op Note (Signed)
NAME:  Kelly [name], Kelly [name]                 ACCOUNT NO.:  [id number]  MEDICAL RECORD NO.:  [id number]  LOCATION:  [facility]                         FACILITY:  [facility]  PHYSICIAN:  Theodore Manny, MD     DATE OF BIRTH:  07/17/1966  DATE OF PROCEDURE:  11/23/2012 DATE OF DISCHARGE:  11/23/2012                              OPERATIVE REPORT   DIAGNOSIS:  Residual right renal stone after lithotripsy.  PROCEDURES: 1. Right ureteroscopic stone manipulation with basketing of the stone. 2. Right retrograde pyelogram interpretation. 3. Exchange of right ureteral stent, 6 x 24 with tether to the right     thigh.  FINDINGS:  Right lower pole residual fragments, largest of which approximately 3 mm.  SPECIMENS:  Right renal stone for compositional analysis, part of it was given to the patient.  ESTIMATED BLOOD LOSS:  Nil.  COMPLICATIONS:  None.  INDICATIONS:  Ms. Flesch is a pleasant 47-year-old lady, has history of right ureteral stent who was initially managed with stenting and had shockwave lithotripsy performed by Dr. MacDiarmid.  She had nonresolution of stone based on her follow up KUB.  Options were discussed including observation versus ureteroscopic retrieval and she wished to proceed with the latter.  Informed consent was obtained and placed in medical record.  PROCEDURE IN DETAIL:  The patient being Kelly [name], was verified. Procedure being right ureteroscopic stone manipulation was confirmed. Procedure was carried out.  Time-out was performed.  Intravenous antibiotics administered.  General LMA anesthesia was introduced.  The patient was placed into a low lithotomy position.  Sterile field created by prepping and draping the patient's vagina, introitus, and proximal thighs using iodine x3.  Next, cystourethroscopy was performed using a 22-French rigid cystoscope with 12-degree offset lens.  Inspection of the bladder revealed no diverticula, calcifications, or papillary lesions.  Distal  end of right ureteral stent was seen in situ with expected edema.  This is brought to the level of the urethral meatus through which a 0.038 Glidewire was advanced at the level of the upper pole.  The stent was exchanged for a new 5-French end-hole catheter and right retrograde pyelogram seen.  Right retrograde pyelogram reveals a single right ureter, single system right kidney.  There were no filling defects or narrowing or hydronephrosis noted.  The Glidewire was once again reintroduced as a safety wire.  Next, semi-rigid ureteroscopy was performed in the entire length of the right ureter.  No mucosal abnormalities or calcifications were noted.  The semi-rigid ureteroscope was then exchanged for a 30 cm 12/14 ureteral access sheath, over a Sensor working wire to the level of the renal pelvis.  Next, digital flexible ureteroscopy was performed and inspection of each calix was performed on the right side.  There was a conglomerate of lower pole stone fragments, the largest of which was about 3 mm and an escape type basket was used to grasp these and they were sequentially brought on their entirety and set aside for compositional analysis.  Repeat panendoscopy of the right kidney revealed no residual stone fragments.  No perforation and excellent hemostasis.  The access sheath was removed under continuous ureteroscopy and no mucosal abnormalities were found.    Finally, a new 6 x 24 double-J stent was placed with remaining safety wire.  Good proximal and distal curl were noted.  This was tethered to the patient's right thigh. During all, ureteroscopic portions, an 8-French feeding tube was in the urinary bladder for pressure release.  Procedure was then terminated. The patient tolerated the procedure well.  There were no immediate periprocedural complications.  The patient was taken to the postanesthesia care unit in stable condition.          ______________________________ Theodore  Manny, MD     TM/MEDQ  D:  11/23/2012  T:  11/23/2012  Job:  [id number] 

## 2012-11-24 NOTE — Op Note (Deleted)
NAME:  [name], Kelly                 ACCOUNT NO.:  [id number]  MEDICAL RECORD NO.:  [id number]  LOCATION:  [facility]                         FACILITY:  [facility]  PHYSICIAN:  Theodore Manny, MD     DATE OF BIRTH:  08/22/1966  DATE OF PROCEDURE:  11/23/2012 DATE OF DISCHARGE:  11/23/2012                              OPERATIVE REPORT   DIAGNOSIS:  Residual right renal stone after lithotripsy.  PROCEDURES: 1. Right ureteroscopic stone manipulation with basketing of the stone. 2. Right retrograde pyelogram interpretation. 3. Exchange of right ureteral stent, 6 x 24 with tether to the right     thigh.  FINDINGS:  Right lower pole residual fragments, largest of which approximately 3 mm.  SPECIMENS:  Right renal stone for compositional analysis, part of it was given to the patient.  ESTIMATED BLOOD LOSS:  Nil.  COMPLICATIONS:  None.  INDICATIONS:  Kelly [name] is a pleasant 46-year-old lady, has history of right ureteral stent who was initially managed with stenting and had shockwave lithotripsy performed by Dr. MacDiarmid.  She had nonresolution of stone based on her follow up KUB.  Options were discussed including observation versus ureteroscopic retrieval and she wished to proceed with the latter.  Informed consent was obtained and placed in medical record.  PROCEDURE IN DETAIL:  The patient being Kelly [name], was verified. Procedure being right ureteroscopic stone manipulation was confirmed. Procedure was carried out.  Time-out was performed.  Intravenous antibiotics administered.  General LMA anesthesia was introduced.  The patient was placed into a low lithotomy position.  Sterile field created by prepping and draping the patient's vagina, introitus, and proximal thighs using iodine x3.  Next, cystourethroscopy was performed using a 22-French rigid cystoscope with 12-degree offset lens.  Inspection of the bladder revealed no diverticula, calcifications, or papillary lesions.  Distal  end of right ureteral stent was seen in situ with expected edema.  This is brought to the level of the urethral meatus through which a 0.038 Glidewire was advanced at the level of the upper pole.  The stent was exchanged for a new 5-French end-hole catheter and right retrograde pyelogram seen.  Right retrograde pyelogram reveals a single right ureter, single system right kidney.  There were no filling defects or narrowing or hydronephrosis noted.  The Glidewire was once again reintroduced as a safety wire.  Next, semi-rigid ureteroscopy was performed in the entire length of the right ureter.  No mucosal abnormalities or calcifications were noted.  The semi-rigid ureteroscope was then exchanged for a 30 cm 12/14 ureteral access sheath, over a Sensor working wire to the level of the renal pelvis.  Next, digital flexible ureteroscopy was performed and inspection of each calix was performed on the right side.  There was a conglomerate of lower pole stone fragments, the largest of which was about 3 mm and an escape type basket was used to grasp these and they were sequentially brought on their entirety and set aside for compositional analysis.  Repeat panendoscopy of the right kidney revealed no residual stone fragments.  No perforation and excellent hemostasis.  The access sheath was removed under continuous ureteroscopy and no mucosal abnormalities were found.    Finally, a new 6 x 24 double-J stent was placed with remaining safety wire.  Good proximal and distal curl were noted.  This was tethered to the patient's right thigh. During all, ureteroscopic portions, an 8-French feeding tube was in the urinary bladder for pressure release.  Procedure was then terminated. The patient tolerated the procedure well.  There were no immediate periprocedural complications.  The patient was taken to the postanesthesia care unit in stable condition.          ______________________________ Theodore  Manny, MD     TM/MEDQ  D:  11/23/2012  T:  11/23/2012  Job:  [id number] 

## 2012-11-24 NOTE — Op Note (Deleted)
Kelly, [name] NO.:  [id number]  MEDICAL RECORD NO.:  [id number]  LOCATION:  [facility]                         FACILITY:  [facility]  PHYSICIAN:  Sebastian Ache, MD     DATE OF BIRTH:  [birth year]  DATE OF PROCEDURE:  11/23/2012 DATE OF DISCHARGE:  11/23/2012                              OPERATIVE REPORT   DIAGNOSIS:  Residual right renal stone after lithotripsy.  PROCEDURES: 1. Right ureteroscopic stone manipulation with basketing of the stone. 2. Right retrograde pyelogram interpretation. 3. Exchange of right ureteral stent, 6 x 24 with tether to the right     thigh.  FINDINGS:  Right lower pole residual fragments, largest of which approximately 3 mm.  SPECIMENS:  Right renal stone for compositional analysis, part of it was given to the patient.  ESTIMATED BLOOD LOSS:  Nil.  COMPLICATIONS:  None.  INDICATIONS:  Kelly [name] is a pleasant 47 year old lady, has history of right ureteral stent who was initially managed with stenting and had shockwave lithotripsy performed by Dr. Sherron Monday.  She had nonresolution of stone based on her follow up KUB.  Options were discussed including observation versus ureteroscopic retrieval and she wished to proceed with the latter.  Informed consent was obtained and placed in medical record.  PROCEDURE IN DETAIL:  The patient being [name], was verified. Procedure being right ureteroscopic stone manipulation was confirmed. Procedure was carried out.  Time-out was performed.  Intravenous antibiotics administered.  General LMA anesthesia was introduced.  The patient was placed into a low lithotomy position.  Sterile field created by prepping and draping the patient's vagina, introitus, and proximal thighs using iodine x3.  Next, cystourethroscopy was performed using a 22-French rigid cystoscope with 12-degree offset lens.  Inspection of the bladder revealed no diverticula, calcifications, or papillary lesions.  Distal  end of right ureteral stent was seen in situ with expected edema.  This is brought to the level of the urethral meatus through which a 0.038 Glidewire was advanced at the level of the upper pole.  The stent was exchanged for a new 5-French end-hole catheter and right retrograde pyelogram seen.  Right retrograde pyelogram reveals a single right ureter, single system right kidney.  There were no filling defects or narrowing or hydronephrosis noted.  The Glidewire was once again reintroduced as a safety wire.  Next, semi-rigid ureteroscopy was performed in the entire length of the right ureter.  No mucosal abnormalities or calcifications were noted.  The semi-rigid ureteroscope was then exchanged for a 30 cm 12/14 ureteral access sheath, over a Sensor working wire to the level of the renal pelvis.  Next, digital flexible ureteroscopy was performed and inspection of each calix was performed on the right side.  There was a conglomerate of lower pole stone fragments, the largest of which was about 3 mm and an escape type basket was used to grasp these and they were sequentially brought on their entirety and set aside for compositional analysis.  Repeat panendoscopy of the right kidney revealed no residual stone fragments.  No perforation and excellent hemostasis.  The access sheath was removed under continuous ureteroscopy and no mucosal abnormalities were found.  Finally, a new 6 x 24 double-J stent was placed with remaining safety wire.  Good proximal and distal curl were noted.  This was tethered to the patient's right thigh. During all, ureteroscopic portions, an 8-French feeding tube was in the urinary bladder for pressure release.  Procedure was then terminated. The patient tolerated the procedure well.  There were no immediate periprocedural complications.  The patient was taken to the postanesthesia care unit in stable condition.          ______________________________ Sebastian Ache, MD     TM/MEDQ  D:  11/23/2012  T:  11/23/2012  Job:  [id number]

## 2012-11-26 ENCOUNTER — Encounter (HOSPITAL_BASED_OUTPATIENT_CLINIC_OR_DEPARTMENT_OTHER): Payer: Self-pay | Admitting: Urology

## 2013-01-11 ENCOUNTER — Ambulatory Visit (INDEPENDENT_AMBULATORY_CARE_PROVIDER_SITE_OTHER): Payer: 59 | Admitting: Family Medicine

## 2013-01-11 ENCOUNTER — Encounter: Payer: Self-pay | Admitting: Family Medicine

## 2013-01-11 DIAGNOSIS — H9201 Otalgia, right ear: Secondary | ICD-10-CM

## 2013-01-11 DIAGNOSIS — F17200 Nicotine dependence, unspecified, uncomplicated: Secondary | ICD-10-CM

## 2013-01-11 DIAGNOSIS — H9209 Otalgia, unspecified ear: Secondary | ICD-10-CM

## 2013-01-11 DIAGNOSIS — J309 Allergic rhinitis, unspecified: Secondary | ICD-10-CM

## 2013-01-11 NOTE — Progress Notes (Signed)
  Subjective:    Patient ID: Kelly [name], female    DOB: Feb 03, 1966, 47 y.o.   MRN: [id number]  HPI She has a three-day history of right ear sharp piercing pain that tends to come and go. She does have allergy symptoms of sneezing, itchy watery eyes, rhinorrhea . She will occasionally take Claritin. She continues to smoke.   Review of Systems     Objective:   Physical Exam alert and in no distress. Tympanic membranes and canals are normal. Throat is clear. Tonsils are normal. Neck is supple without adenopathy or thyromegaly.         Assessment & Plan:  Otalgia of right ear  Allergic rhinitis  Current smoker continue on Claritin. Encouraged her to quit smoking.

## 2013-05-03 ENCOUNTER — Encounter: Payer: Self-pay | Admitting: Gynecology

## 2013-05-03 ENCOUNTER — Other Ambulatory Visit (HOSPITAL_COMMUNITY)
Admission: RE | Admit: 2013-05-03 | Discharge: 2013-05-03 | Disposition: A | Discharge status: Home / Self Care | Payer: 59 | Source: Ambulatory Visit | Attending: Gynecology | Admitting: Gynecology

## 2013-05-03 ENCOUNTER — Ambulatory Visit (INDEPENDENT_AMBULATORY_CARE_PROVIDER_SITE_OTHER): Payer: 59 | Admitting: Gynecology

## 2013-05-03 VITALS — BP 124/72 | Ht 63.25 in | Wt 174.0 lb

## 2013-05-03 DIAGNOSIS — R635 Abnormal weight gain: Secondary | ICD-10-CM

## 2013-05-03 DIAGNOSIS — Z01419 Encounter for gynecological examination (general) (routine) without abnormal findings: Secondary | ICD-10-CM | POA: Insufficient documentation

## 2013-05-03 DIAGNOSIS — K6289 Other specified diseases of anus and rectum: Secondary | ICD-10-CM

## 2013-05-03 DIAGNOSIS — N760 Acute vaginitis: Secondary | ICD-10-CM

## 2013-05-03 DIAGNOSIS — B9689 Other specified bacterial agents as the cause of diseases classified elsewhere: Secondary | ICD-10-CM

## 2013-05-03 DIAGNOSIS — Z1151 Encounter for screening for human papillomavirus (HPV): Secondary | ICD-10-CM | POA: Insufficient documentation

## 2013-05-03 DIAGNOSIS — N898 Other specified noninflammatory disorders of vagina: Secondary | ICD-10-CM

## 2013-05-03 DIAGNOSIS — Z975 Presence of (intrauterine) contraceptive device: Secondary | ICD-10-CM | POA: Insufficient documentation

## 2013-05-03 DIAGNOSIS — K629 Disease of anus and rectum, unspecified: Secondary | ICD-10-CM

## 2013-05-03 DIAGNOSIS — Z113 Encounter for screening for infections with a predominantly sexual mode of transmission: Secondary | ICD-10-CM

## 2013-05-03 LAB — WET PREP FOR TRICH, YEAST, CLUE
Trich, Wet Prep: NONE SEEN
WBC, Wet Prep HPF POC: NONE SEEN
Yeast Wet Prep HPF POC: NONE SEEN

## 2013-05-03 MED ORDER — PHENTERMINE HCL 37.5 MG PO CAPS: 37.5000 mg | ORAL_CAPSULE | ORAL | Status: DC

## 2013-05-03 MED ORDER — LIDOCAINE (ANORECTAL) 5 % EX CREA: TOPICAL_CREAM | CUTANEOUS | Status: DC

## 2013-05-03 MED ORDER — TINIDAZOLE 500 MG PO TABS: ORAL_TABLET | ORAL | Status: DC

## 2013-05-03 NOTE — Patient Instructions (Addendum)
Bacterial Vaginosis Bacterial vaginosis (BV) is a vaginal infection where the normal balance of bacteria in the vagina is disrupted. The normal balance is then replaced by an overgrowth of certain bacteria. There are several different kinds of bacteria that can cause BV. BV is the most common vaginal infection in women of childbearing age. CAUSES   The cause of BV is not fully understood. BV develops when there is an increase or imbalance of harmful bacteria.  Some activities or behaviors can upset the normal balance of bacteria in the vagina and put women at increased risk including:  Having a new sex partner or multiple sex partners.  Douching.  Using an intrauterine device (IUD) for contraception.  It is not clear what role sexual activity plays in the development of BV. However, women that have never had sexual intercourse are rarely infected with BV. Women do not get BV from toilet seats, bedding, swimming pools or from touching objects around them.  SYMPTOMS   Grey vaginal discharge.  A fish-like odor with discharge, especially after sexual intercourse.  Itching or burning of the vagina and vulva.  Burning or pain with urination.  Some women have no signs or symptoms at all. DIAGNOSIS  Your caregiver must examine the vagina for signs of BV. Your caregiver will perform lab tests and look at the sample of vaginal fluid through a microscope. They will look for bacteria and abnormal cells (clue cells), a pH test higher than 4.5, and a positive amine test all associated with BV.  RISKS AND COMPLICATIONS   Pelvic inflammatory disease (PID).  Infections following gynecology surgery.  Developing HIV.  Developing herpes virus. TREATMENT  Sometimes BV will clear up without treatment. However, all women with symptoms of BV should be treated to avoid complications, especially if gynecology surgery is planned. Female partners generally do not need to be treated. However, BV may spread  between female sex partners so treatment is helpful in preventing a recurrence of BV.   BV may be treated with antibiotics. The antibiotics come in either pill or vaginal cream forms. Either can be used with nonpregnant or pregnant women, but the recommended dosages differ. These antibiotics are not harmful to the baby.  BV can recur after treatment. If this happens, a second round of antibiotics will often be prescribed.  Treatment is important for pregnant women. If not treated, BV can cause a premature delivery, especially for a pregnant woman who had a premature birth in the past. All pregnant women who have symptoms of BV should be checked and treated.  For chronic reoccurrence of BV, treatment with a type of prescribed gel vaginally twice a week is helpful. HOME CARE INSTRUCTIONS   Finish all medication as directed by your caregiver.  Do not have sex until treatment is completed.  Tell your sexual partner that you have a vaginal infection. They should see their caregiver and be treated if they have problems, such as a mild rash or itching.  Practice safe sex. Use condoms. Only have 1 sex partner. PREVENTION  Basic prevention steps can help reduce the risk of upsetting the natural balance of bacteria in the vagina and developing BV:  Do not have sexual intercourse (be abstinent).  Do not douche.  Use all of the medicine prescribed for treatment of BV, even if the signs and symptoms go away.  Tell your sex partner if you have BV. That way, they can be treated, if needed, to prevent reoccurrence. SEEK MEDICAL CARE IF:     Your symptoms are not improving after 3 days of treatment.  You have increased discharge, pain, or fever. MAKE SURE YOU:   Understand these instructions.  Will watch your condition.  Will get help right away if you are not doing well or get worse. FOR MORE INFORMATION  Division of STD Prevention (DSTDP), Centers for Disease Control and Prevention:  SolutionApps.co.za American Social Health Association (ASHA): www.ashastd.org  Document Released: 08/08/2005 Document Revised: 10/31/2011 Document Reviewed: 01/29/2009 [facility] Patient Information 2014 [place], [place]. Phentermine tablets or capsules What is this medicine? PHENTERMINE (FEN ter meen) decreases your appetite. It is used with a reduced calorie diet and exercise to help you lose weight. This medicine may be used for other purposes; ask your health care provider or pharmacist if you have questions. What should I tell my health care provider before I take this medicine? They need to know if you have any of these conditions: -agitation -glaucoma -heart disease -high blood pressure -history of substance abuse -lung disease called Primary Pulmonary Hypertension (PPH) -taken an MAOI like Carbex, Eldepryl, Marplan, Nardil, or Parnate in last 14 days -thyroid disease -an unusual or allergic reaction to phentermine, other medicines, foods, dyes, or preservatives -pregnant or trying to get pregnant -breast-feeding How should I use this medicine? Take this medicine by mouth with a glass of water. Follow the directions on the prescription label. This medicine is usually taken 30 minutes before or 1 to 2 hours after breakfast. Avoid taking this medicine in the evening. It may interfere with sleep. Take your doses at regular intervals. Do not take your medicine more often than directed. Talk to your pediatrician regarding the use of this medicine in children. Special care may be needed. Overdosage: If you think you have taken too much of this medicine contact a poison control center or emergency room at once. NOTE: This medicine is only for you. Do not share this medicine with others. What if I miss a dose? If you miss a dose, take it as soon as you can. If it is almost time for your next dose, take only that dose. Do not take double or extra doses. What may interact with this medicine? Do  not take this medicine with any of the following medications: -duloxetine -MAOIs like Carbex, Eldepryl, Marplan, Nardil, and Parnate -medicines for colds or breathing difficulties like pseudoephedrine or phenylephrine -procarbazine -sibutramine -SSRIs like citalopram, escitalopram, fluoxetine, fluvoxamine, paroxetine, and sertraline -stimulants like dexmethylphenidate, methylphenidate or modafinil -venlafaxine This medicine may also interact with the following medications: -medicines for diabetes This list may not describe all possible interactions. Give your health care provider a list of all the medicines, herbs, non-prescription drugs, or dietary supplements you use. Also tell them if you smoke, drink alcohol, or use illegal drugs. Some items may interact with your medicine. What should I watch for while using this medicine? Notify your physician immediately if you become short of breath while doing your normal activities. Do not take this medicine within 6 hours of bedtime. It can keep you from getting to sleep. Avoid drinks that contain caffeine and try to stick to a regular bedtime every night. This medicine was intended to be used in addition to a healthy diet and exercise. The best results are achieved this way. This medicine is only indicated for short-term use. Eventually your weight loss may level out. At that point, the drug will only help you maintain your new weight. Do not increase or in any way change your dose without consulting  your doctor. You may get drowsy or dizzy. Do not drive, use machinery, or do anything that needs mental alertness until you know how this medicine affects you. Do not stand or sit up quickly, especially if you are an older patient. This reduces the risk of dizzy or fainting spells. Alcohol may increase dizziness and drowsiness. Avoid alcoholic drinks. What side effects may I notice from receiving this medicine? Side effects that you should report to your  doctor or health care professional as soon as possible: -chest pain, palpitations -depression or severe changes in mood -increased blood pressure -irritability -nervousness or restlessness -severe dizziness -shortness of breath -problems urinating -unusual swelling of the legs -vomiting Side effects that usually do not require medical attention (report to your doctor or health care professional if they continue or are bothersome): -blurred vision or other eye problems -changes in sexual ability or desire -constipation or diarrhea -difficulty sleeping -dry mouth or unpleasant taste -headache -nausea This list may not describe all possible side effects. Call your doctor for medical advice about side effects. You may report side effects to FDA at 1-800-FDA-1088. Where should I keep my medicine? Keep out of the reach of children. This medicine can be abused. Keep your medicine in a safe place to protect it from theft. Do not share this medicine with anyone. Selling or giving away this medicine is dangerous and against the law. Store at room temperature between 20 and 25 degrees C (68 and 77 degrees F). Keep container tightly closed. Throw away any unused medicine after the expiration date. NOTE: This sheet is a summary. It may not cover all possible information. If you have questions about this medicine, talk to your doctor, pharmacist, or health care provider.  2012, Elsevier/Gold Standard. (09/22/2010 11:02:44 AM)Tinidazole tablets What is this medicine? TINIDAZOLE (tye NI da zole) is an antiinfective. It is used to treat amebiasis, giardiasis, trichomoniasis, and vaginosis. It will not work for colds, flu, or other viral infections. This medicine may be used for other purposes; ask your health care provider or pharmacist if you have questions. What should I tell my health care provider before I take this medicine? They need to know if you have any of these conditions: -anemia or other  blood disorders -if you frequently drink alcohol containing drinks -receiving hemodialysis -seizure disorder -an unusual or allergic reaction to tinidazole, other medicines, foods, dyes, or preservatives -pregnant or trying to get pregnant -breast-feeding How should I use this medicine? Take this medicine by mouth with a full glass of water. Follow the directions on the prescription label. Take with food. Take your medicine at regular intervals. Do not take your medicine more often than directed. Take all of your medicine as directed even if you think you are better. Do not skip doses or stop your medicine early. Talk to your pediatrician regarding the use of this medicine in children. While this drug may be prescribed for children as young as 22 years of age for selected conditions, precautions do apply. Overdosage: If you think you have taken too much of this medicine contact a poison control center or emergency room at once. NOTE: This medicine is only for you. Do not share this medicine with others. What if I miss a dose? If you miss a dose, take it as soon as you can. If it is almost time for your next dose, take only that dose. Do not take double or extra doses. What may interact with this medicine? Do not take this  medicine with any of the following medications: -alcohol or any product that contains alcohol -amprenavir oral solution -disulfiram -paclitaxel injection -ritonavir oral solution -sertraline oral solution -sulfamethoxazole-trimethoprim injection This medicine may also interact with the following medications: -cholestyramine -cimetidine -conivaptan -cyclosporin -fluorouracil -fosphenytoin, phenytoin -ketoconazole -lithium -phenobarbital -tacrolimus -warfarin This list may not describe all possible interactions. Give your health care provider a list of all the medicines, herbs, non-prescription drugs, or dietary supplements you use. Also tell them if you smoke, drink  alcohol, or use illegal drugs. Some items may interact with your medicine. What should I watch for while using this medicine? Tell your doctor or health care professional if your symptoms do not improve or if they get worse. Avoid alcoholic drinks while you are taking this medicine and for three days afterward. Alcohol may make you feel dizzy, sick, or flushed. If you are being treated for a sexually transmitted disease, avoid sexual contact until you have finished your treatment. Your sexual partner may also need treatment. What side effects may I notice from receiving this medicine? Side effects that you should report to your doctor or health care professional as soon as possible: -allergic reactions like skin rash, itching or hives, swelling of the face, lips, or tongue -breathing problems -confusion, depression -dark or white patches in the mouth -feeling faint or lightheaded, falls -fever, infection -numbness, tingling, pain or weakness in the hands or feet -pain when passing urine -seizures -unusually weak or tired -vaginal irritation or discharge -vomiting Side effects that usually do not require medical attention (report to your doctor or health care professional if they continue or are bothersome): -dark brown or reddish urine -diarrhea -headache -loss of appetite -metallic taste -nausea -stomach upset This list may not describe all possible side effects. Call your doctor for medical advice about side effects. You may report side effects to FDA at 1-800-FDA-1088. Where should I keep my medicine? Keep out of the reach of children. Store at room temperature between 15 and 30 degrees C (59 and 86 degrees F). Protect from light and moisture. Keep container tightly closed. Throw away any unused medicine after the expiration date. NOTE: This sheet is a summary. It may not cover all possible information. If you have questions about this medicine, talk to your doctor, pharmacist, or  health care provider.  2013, Elsevier/Gold Standard. (05/05/2008 3:22:28 PM)

## 2013-05-03 NOTE — Progress Notes (Addendum)
Kelly [name] 1966-07-26 [id number]   History:    47 y.o.  for annual gyn exam who was complaining of a vaginal discharge and fishy odor and wanted to have an STD screen said she had one new partner recently. Patient also was having some perirectal irritation and burning and discomfort although she is not engaged in any anal intercourse. Patient also concerned about her weight gain. In the past she had been on phentermine exercise and diet she lost a lot of weight and would like to at least started back again for 3 more months. Patient has a Mirena IUD for contraception which is due to be removed in September 2019. 5 years ago she had a colonoscopy due to the fact her father had colon cancer. Patient's last mammogram at age 70 was normal. Patient denies any prior history of abnormal Pap smear patient's primary physician is Dr. Susann Givens and she has informed me that she is no longer on hypertension medication but is taking are Lipitor.  Past medical history,surgical history, family history and social history were all reviewed and documented in the EPIC chart.  Gynecologic History No LMP recorded. Patient is not currently having periods (Reason: IUD). Contraception: IUD Last Pap: 2011. Results were: normal Last mammogram: age 41. Results were: normal  Obstetric History OB History  Gravida Para Term Preterm AB SAB TAB Ectopic Multiple Living  4 3 3  1     3     # Outcome Date GA Lbr Len/2nd Weight Sex Delivery Anes PTL Lv  4 ABT           3 TRM     F CS  N Y  2 TRM     F CS  N Y  1 TRM     M CS  N Y       ROS: A ROS was performed and pertinent positives and negatives are included in the history.  GENERAL: No fevers or chills. HEENT: No change in vision, no earache, sore throat or sinus congestion. NECK: No pain or stiffness. CARDIOVASCULAR: No chest pain or pressure. No palpitations. PULMONARY: No shortness of breath, cough or wheeze. GASTROINTESTINAL: No abdominal pain, nausea, vomiting or  diarrhea, melena or bright red blood per rectum. GENITOURINARY: No urinary frequency, urgency, hesitancy or dysuria. MUSCULOSKELETAL: No joint or muscle pain, no back pain, no recent trauma. DERMATOLOGIC: No rash, no itching, no lesions. ENDOCRINE: No polyuria, polydipsia, no heat or cold intolerance. No recent change in weight. HEMATOLOGICAL: No anemia or easy bruising or bleeding. NEUROLOGIC: No headache, seizures, numbness, tingling or weakness. PSYCHIATRIC: No depression, no loss of interest in normal activity or change in sleep pattern.     Exam: chaperone present  BP 124/72  Ht 5' 3.25" (1.607 m)  Wt 174 lb (78.926 kg)  BMI 30.56 kg/m2  Body mass index is 30.56 kg/(m^2).  General appearance : Well developed well nourished female. No acute distress HEENT: Neck supple, trachea midline, no carotid bruits, no thyroidmegaly Lungs: Clear to auscultation, no rhonchi or wheezes, or rib retractions  Heart: Regular rate and rhythm, no murmurs or gallops Breast:Examined in sitting and supine position were symmetrical in appearance, no palpable masses or tenderness,  no skin retraction, no nipple inversion, no nipple discharge, no skin discoloration, no axillary or supraclavicular lymphadenopathy Abdomen: no palpable masses or tenderness, no rebound or guarding Extremities: no edema or skin discoloration or tenderness  Pelvic:  Bartholin, Urethra, Skene Glands: Within normal limits  Vagina: fascia brownish-like discharge noted  Cervix: No gross lesions or discharge, IUD string seen  Uterus  anteverted, normal size, shape and consistency, non-tender and mobile  Adnexa  Without masses or tenderness  Anus and perineum  normal   Rectovaginal  normal sphincter tone without palpated masses or tenderness Physical Exam  Genitourinary:           Hemoccult patient is scheduled colonoscopy this year   Wet prep Pos Amine, moderate clue cells and too numerous to count  bacteria  Assessment/Plan:  47 y.o. female for annual exam with clinical evidence of bacterial vaginosis will be started on Tindamax 500 mg 4 tablets today and repeat 4 tablets tomorrow. GC and chlamydia culture pending at time of this dictation. Hepatitis B, hepatitis C, HIV and RPR were drawn today. Pap smear was done today. Patient was reminded her monthly self breast examination and to schedule her overdue mammogram and colonoscopy. Her PCP will be drawn her blood work.  Patient will be started on phentermine 37.5 mg 1 by mouth daily. The patient was counseled about potential cardiac valvular Heart disease and pulmonary hypertension when used for extended time. She will be prescribed for only 3 months. Patient fully accepts risks. We discussed the importance of regular exercise and proper nutrition. She will return to the office monthly for blood pressure check and cardiac and pulmonary auscultation. 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  Herpes culture obtained from the perirectal irritated area. Topical angle lidocaine gel prescribed to use when necessary.  Ok Edwards MD, 5:21 PM 05/03/2013

## 2013-05-04 LAB — GC/CHLAMYDIA PROBE AMP
CT Probe RNA: NEGATIVE
GC Probe RNA: NEGATIVE

## 2013-05-04 LAB — HIV ANTIBODY (ROUTINE TESTING W REFLEX): HIV: NONREACTIVE

## 2013-05-04 LAB — HEPATITIS B SURFACE ANTIGEN: Hepatitis B Surface Ag: NEGATIVE

## 2013-05-04 LAB — RPR

## 2013-05-04 LAB — HEPATITIS C ANTIBODY: HCV Ab: NEGATIVE

## 2013-05-06 ENCOUNTER — Telehealth: Payer: Self-pay | Admitting: *Deleted

## 2013-05-06 ENCOUNTER — Encounter: Payer: Self-pay | Admitting: *Deleted

## 2013-05-06 NOTE — Telephone Encounter (Signed)
Pt informed with STD panel on 05/03/13 except for HSV culture is not back yet.

## 2013-05-08 ENCOUNTER — Telehealth: Payer: Self-pay

## 2013-05-08 ENCOUNTER — Encounter: Payer: Self-pay | Admitting: Gynecology

## 2013-05-08 ENCOUNTER — Other Ambulatory Visit: Payer: Self-pay | Admitting: Gynecology

## 2013-05-08 ENCOUNTER — Telehealth: Payer: Self-pay | Admitting: *Deleted

## 2013-05-08 MED ORDER — ACYCLOVIR 5 % EX OINT: TOPICAL_OINTMENT | Freq: Three times a day (TID) | CUTANEOUS | Status: DC

## 2013-05-08 MED ORDER — FLUCONAZOLE 150 MG PO TABS: 150.0000 mg | ORAL_TABLET | Freq: Once | ORAL | Status: DC

## 2013-05-08 MED ORDER — ACYCLOVIR 5 % EX CREA: TOPICAL_CREAM | CUTANEOUS | Status: DC

## 2013-05-08 NOTE — Telephone Encounter (Signed)
Pharmacy called to say that there is no generic available in the Zovirax cream and it will be expensive. It will save the patient a lot of money if they can give her the Zovirax ointment because it does have a generic.  I okayed that and submitted new Rx.

## 2013-05-08 NOTE — Telephone Encounter (Signed)
Pt informed rx sent.

## 2013-05-08 NOTE — Telephone Encounter (Signed)
Call in prescriptionn for Diflucan 150 mg 1 PO refill x 1

## 2013-05-08 NOTE — Telephone Encounter (Signed)
This will work fine

## 2013-05-08 NOTE — Telephone Encounter (Signed)
Pt was seen on 05/03/13 and received treatment for BV infection, pt now has yeast infection itching, white discharge. Pt is requesting diflucan. Please advise

## 2013-05-10 ENCOUNTER — Telehealth: Payer: Self-pay

## 2013-05-10 NOTE — Telephone Encounter (Signed)
Recently diagnosed with HSV.  Asked if you would call her as she has some questions. She was informed you are out of the office today and it may be the first of the week.

## 2013-05-10 NOTE — Telephone Encounter (Signed)
I called patient to let her know Dr Glenetta Hew is out of the office until Monday.  Patient wanted to tell me that she took the Diflucan tab but her yeast inf is worse instead of better. She said she had a refill and just called that in and is going to go get it and take the 2nd one.  I suggested she might try something vaginal otc, ie Monistat cream, Gyne-Lotrimin along with the Diflucan. Could even use topically.  She will try the other Diflucan and I told her if symptoms persist to come back in for visit with Dr. Glenetta Hew.

## 2013-05-10 NOTE — Telephone Encounter (Signed)
Patient with recent HSV2 diagnosis.  She asked if you could call her as she has some questions.  She knows you are out of the office until Monday and it might be then before she hears from you.

## 2013-05-15 ENCOUNTER — Encounter: Payer: 59 | Admitting: Gynecology

## 2013-05-20 NOTE — Telephone Encounter (Signed)
Yes I did

## 2013-05-20 NOTE — Telephone Encounter (Signed)
I routed this on 05/10/13 to you. I was out last week. Have you called her?

## 2013-05-20 NOTE — Telephone Encounter (Signed)
I routed this

## 2013-05-30 ENCOUNTER — Ambulatory Visit (INDEPENDENT_AMBULATORY_CARE_PROVIDER_SITE_OTHER): Payer: 59 | Admitting: Family Medicine

## 2013-05-30 ENCOUNTER — Encounter: Payer: Self-pay | Admitting: Family Medicine

## 2013-05-30 VITALS — BP 120/76 | HR 80

## 2013-05-30 DIAGNOSIS — F17200 Nicotine dependence, unspecified, uncomplicated: Secondary | ICD-10-CM

## 2013-05-30 DIAGNOSIS — J209 Acute bronchitis, unspecified: Secondary | ICD-10-CM

## 2013-05-30 MED ORDER — AMOXICILLIN 875 MG PO TABS: 875.0000 mg | ORAL_TABLET | Freq: Two times a day (BID) | ORAL | Status: DC

## 2013-05-30 NOTE — Progress Notes (Signed)
  Subjective:    Patient ID: Kelly [name], female    DOB: August 04, 1966, 47 y.o.   MRN: [id number]  HPI Has a three-week history of sneezing, rhinorrhea, sinus congestion and pain, slight PND with cough that has become productive. No fever or chills. She did note flecks of blood in her productive sputum yesterday. She also has left ear pain. She continues to smoke and is not interested in quitting. She remains under stress tends to deal with her son and his psychological issues. He also has a lesion in her lower mid back that she would like evaluated.  Review of Systems     Objective:   Physical Exam alert and in no distress. Tympanic membranes and canals are normal. Throat is clear. Tonsils are normal. Neck is supple without adenopathy or thyromegaly. Cardiac exam shows a regular sinus rhythm without murmurs or gallops. Lungs are clear to auscultation. She has a 0.5 cm round pigmented lesion in the lower mid back area       Assessment & Plan:  Current smoker  Acute bronchitis - Plan: amoxicillin (AMOXIL) 875 MG tablet  I reassured her that I did not think the lesion on her back was anything to be concerned about.

## 2013-05-31 ENCOUNTER — Encounter: Payer: Self-pay | Admitting: *Deleted

## 2013-06-27 ENCOUNTER — Encounter: Payer: Self-pay | Admitting: Family Medicine

## 2013-06-27 ENCOUNTER — Ambulatory Visit (INDEPENDENT_AMBULATORY_CARE_PROVIDER_SITE_OTHER): Payer: 59 | Admitting: Family Medicine

## 2013-06-27 VITALS — BP 112/80 | HR 86 | Temp 98.7°F

## 2013-06-27 DIAGNOSIS — J019 Acute sinusitis, unspecified: Secondary | ICD-10-CM

## 2013-06-27 DIAGNOSIS — F17200 Nicotine dependence, unspecified, uncomplicated: Secondary | ICD-10-CM

## 2013-06-27 MED ORDER — AZITHROMYCIN 500 MG PO TABS: 500.0000 mg | ORAL_TABLET | Freq: Every day | ORAL | Status: DC

## 2013-06-27 NOTE — Progress Notes (Signed)
  Subjective:    Patient ID: Kelly [name], female    DOB: 1965-10-21, 47 y.o.   MRN: [id number]  HPI She has a one-week history of cough and noted sinus pain 2 days ago. She also has had some maxillary sinus pain/pressure as well as bloody nasal discharge earache . Yesterday she noted the onset of left earache. She also complains of upper tooth discomfort She continues to smoke   Review of Systems     Objective:   Physical Exam alert and in no distress. Tympanic membranes and canals are normal. Throat is clear. Tonsils are normal. Neck is supple without adenopathy or thyromegaly. Cardiac exam shows a regular sinus rhythm without murmurs or gallops. Lungs are clear to auscultation. Nasal mucosa is red with tenderness over frontal and axillary sinuses.        Assessment & Plan:  Current smoker  Acute sinusitis - Plan: azithromycin (ZITHROMAX) 500 MG tablet  she would like azithromycin for compliance purposes. Recommend she repeat this in one week if not entirely better. Encouraged her to quit smoking.

## 2013-06-27 NOTE — Patient Instructions (Signed)
Take the azithromycin for 3 days and if still having symptoms after a week then take it for 3 more days

## 2013-08-01 ENCOUNTER — Encounter: Payer: Self-pay | Admitting: Gynecology

## 2013-09-06 ENCOUNTER — Encounter: Payer: Self-pay | Admitting: Medical

## 2013-09-06 ENCOUNTER — Ambulatory Visit (INDEPENDENT_AMBULATORY_CARE_PROVIDER_SITE_OTHER): Payer: 59 | Admitting: Medical

## 2013-09-06 VITALS — BP 120/80 | HR 68 | Temp 98.8°F | Resp 16

## 2013-09-06 DIAGNOSIS — R110 Nausea: Secondary | ICD-10-CM

## 2013-09-06 DIAGNOSIS — J111 Influenza due to unidentified influenza virus with other respiratory manifestations: Secondary | ICD-10-CM

## 2013-09-06 DIAGNOSIS — R509 Fever, unspecified: Secondary | ICD-10-CM

## 2013-09-06 DIAGNOSIS — R69 Illness, unspecified: Principal | ICD-10-CM

## 2013-09-06 LAB — POC INFLUENZA A&B (BINAX/QUICKVUE)
Influenza A, POC: NEGATIVE
Influenza B, POC: NEGATIVE

## 2013-09-06 MED ORDER — OSELTAMIVIR PHOSPHATE 75 MG PO CAPS: 75.0000 mg | ORAL_CAPSULE | Freq: Two times a day (BID) | ORAL | Status: DC

## 2013-09-06 MED ORDER — PROMETHAZINE HCL 25 MG PO TABS: 25.0000 mg | ORAL_TABLET | Freq: Three times a day (TID) | ORAL | Status: DC | PRN

## 2013-09-06 NOTE — Patient Instructions (Addendum)
Influenza, Adult Influenza ("the flu") is a viral infection of the respiratory tract. It causes chills, fever, cough, headache, body aches, and sore throat. Influenza in general will make you feel sicker than when you have a common cold. Symptoms of the illness typically last a few days. Cough and fatigue may continue for as long as 7 to 10 days. Influenza is highly contagious. It spreads easily to others in the droplets from coughs and sneezes. People frequently become infected by touching something that was recently contaminated with the virus and then touch their mouth, nose or eyes. This infection is caused by a virus. Symptoms will not be reduced or improved by taking an antibiotic. Antibiotics are medications that kill bacteria, not viruses. DIAGNOSIS  Diagnosis of influenza is often made based on the history and physical examination as well as the presence of influenza reports occurring in your community. Testing can be done if the diagnosis is not certain. TREATMENT  Since influenza is caused by a virus, antibiotics are not helpful. Your caregiver may prescribe antiviral medicines to shorten the illness and lessen the severity. Your caregiver may also recommend influenza vaccination and/or antiviral medicines for your family members in order to prevent the spread of influenza to them. HOME CARE INSTRUCTIONS  DO NOT GIVE ASPIRIN TO PERSONS WITH INFLUENZA WHO ARE UNDER 18 YEARS OF AGE. This could lead to brain and liver damage (Reye's syndrome). Read the label on over-the-counter medicines.   Stay home from work or school if at all possible until most of your symptoms are gone.   Only take over-the-counter or prescription medicines for pain, discomfort, or fever as directed by your caregiver.   Use a cool mist humidifier to increase air moisture. This will make breathing easier.   Rest until your temperature is nearly normal: 98.6 F (37 C). This usually takes 3 to 4 days. Be sure you get  plenty of rest.   Drink at least eight, eight-ounce glasses of fluids per day. Fluids include water, juice, broth, gelatin, or lemonade.   Cover your mouth and nose when coughing or sneezing and wash your hands often to prevent the spread of this virus to other persons.  PREVENTION  Annual influenza vaccination (flu shots) is the best way to avoid getting influenza. An annual flu shot is now routinely recommended for all adults in the [place] SEEK MEDICAL CARE IF:   You develop shortness of breath while resting.   You have a deep cough with production of mucous or chest pain.   You develop nausea (feeling sick to your stomach), vomiting, or diarrhea.  SEEK IMMEDIATE MEDICAL CARE IF:   You have difficulty breathing, become short of breath, or your skin or nails turn bluish.   You develop severe neck pain or stiffness.   You develop a severe headache, facial pain, or earache.   You have a fever.   You develop nausea or vomiting that cannot be controlled.  Document Released: 08/05/2000 Document Revised: 04/20/2011 Document Reviewed: 06/10/2009 [employer] Patient Information 2012 [employer]. 

## 2013-09-06 NOTE — Progress Notes (Signed)
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  Here for illness, possible flu.  Yesterday morning had sore throat, cough.  During the night awoke with chills, freezing, cough, body aches every where.    Has had 100.3 fever. Using some tylenol.  Today has ongoing chills, aches, runny nose, sneezing, cough, sore throat, nausea, left ear fullness.  No headache, no rash.  No sick contacts at home but working here around flu contacts all last week.   No SOB, diarrhea. using also some Comtrex cold and cough. No other aggravating or relieving factors.  No other c/o.    ROS as in subjective  Objective: BP 120/80  Pulse 68  Temp(Src) 98.8 F (37.1 C) (Oral)  Resp 16   General: Ill-appearing, well-developed, well-nourished Skin: warm, dry HEENT: Nose inflamed and congested, clear conjunctiva, TMs pearly, no sinus tenderness, pharynx with erythema, no exudates Neck: Supple, nontender, shotty cervical adenopathy Heart: Regular rate and rhythm, normal S1, S2, no murmurs Lungs: Clear to auscultation bilaterally, no wheezes, rales, rhonchi Abdomen: Nontender non distended Extremities: Mild generalized tenderness   Assessment and Plan: Encounter Diagnoses  Name Primary?  . Influenza-like illness Yes  . Nausea alone   . Fever, unspecified    although flu test negative, she clinically has flu like illness and we have numerous flu exposures in our office last week.   Thus, discussed diagnosis of influenza.  prescription given for Tamiflu, discussed risks/benefits of medication.  Discussed supportive care including rest, hydration, OTC Tylenol or NSAID for fever, aches, and malaise.  Discussed period of contagion, self quarantine at home away from others to avoid spread of disease, discussed means of transmission, and possible complications including pneumonia.  If worse or not improving within the next 4-5 days, then call or return.

## 2014-05-02 ENCOUNTER — Ambulatory Visit (INDEPENDENT_AMBULATORY_CARE_PROVIDER_SITE_OTHER): Payer: 59 | Admitting: Medical

## 2014-05-02 ENCOUNTER — Encounter: Payer: Self-pay | Admitting: Medical

## 2014-05-02 VITALS — BP 112/78 | HR 92 | Temp 98.4°F

## 2014-05-02 DIAGNOSIS — J0100 Acute maxillary sinusitis, unspecified: Secondary | ICD-10-CM

## 2014-05-02 MED ORDER — AMOXICILLIN 875 MG PO TABS: 875.0000 mg | ORAL_TABLET | Freq: Two times a day (BID) | ORAL | Status: DC

## 2014-05-02 NOTE — Progress Notes (Signed)
Subjective:  Kelly [name] is a 48 y.o. female who presents for possible sinus infection.  Symptoms include several day hx/o sinus pressure, left worse than right,frontal and maxillary, teeth pain, cough, getting some colored nasal drainage, some blood tinged nasal drainage, fatigue, some cough intermittent, post nasal drainage, headache.  Denies fever, NVD.  Using OTC cough/cold medication.  +sick contacts.  No other aggravating or relieving factors.  No other c/o.  ROS as in subjective    Objective: Filed Vitals:   05/02/14 1505  BP: 112/78  Pulse: 92  Temp: 98.4 F (36.9 C)    General appearance: Alert, WD/WN, no distress                             Skin: warm, no rash                           Head: +frontal and maxillary sinus tenderness                            Eyes: conjunctiva normal, corneas clear, PERRLA                            Ears: pearly TMs, external ear canals normal                          Nose: septum midline, turbinates swollen, with erythema and clear discharge             Mouth/throat: MMM, tongue normal, mild pharyngeal erythema                           Neck: supple, no adenopathy, no thyromegaly, nontender                          Heart: RRR, normal S1, S2, no murmurs                         Lungs: CTA bilaterally, no wheezes, rales, or rhonchi      Assessment and Plan: Encounter Diagnosis  Name Primary?  . Acute maxillary sinusitis, recurrence not specified Yes     Prescription given for Amoxicillin.  Can use OTC Mucinex for congestion.  Tylenol or Ibuprofen OTC for fever and malaise.  Discussed symptomatic relief, nasal saline flush, and call or return if worse or not improving in 2-3 days.

## 2014-06-09 ENCOUNTER — Ambulatory Visit (INDEPENDENT_AMBULATORY_CARE_PROVIDER_SITE_OTHER): Payer: 59 | Admitting: Medical

## 2014-06-09 ENCOUNTER — Encounter: Payer: Self-pay | Admitting: Medical

## 2014-06-09 VITALS — BP 102/80 | HR 72 | Temp 98.1°F

## 2014-06-09 DIAGNOSIS — G43A0 Cyclical vomiting, not intractable: Secondary | ICD-10-CM

## 2014-06-09 DIAGNOSIS — R1115 Cyclical vomiting syndrome unrelated to migraine: Secondary | ICD-10-CM

## 2014-06-09 DIAGNOSIS — R197 Diarrhea, unspecified: Secondary | ICD-10-CM

## 2014-06-09 DIAGNOSIS — K529 Noninfective gastroenteritis and colitis, unspecified: Secondary | ICD-10-CM

## 2014-06-09 MED ORDER — ONDANSETRON HCL 4 MG PO TABS: 4.0000 mg | ORAL_TABLET | Freq: Three times a day (TID) | ORAL | Status: DC | PRN

## 2014-06-09 NOTE — Progress Notes (Signed)
Subjective: Here for "stomach bug."  Sick since last Wednesday evening 5 days ago.  Been having diarrhea, vomiting, fever.  Fever has resolved, but still has chills, night sweats, feeling achy.  Diarrhea was worse, but now 5-6 loose stools in last 24 hours, improving some.   She had been using several Imodium daily, but stopped this 2 days ago for fear it was making things worse.   No appetite, has reduced food intake the last few daisy, still having some vomiting but this is improving some.   No frank blood in stool.  The first few days did have some blacker colored stool.  Was on amoxicillin several weeks ago for respiratory infection.  No out of town or country travel.  Does have dogs at home, numerous.  No concern for food poisoning.  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   No other aggravating or relieving factors.     ROS as in subjective  Objective: Filed Vitals:   06/09/14 1448  BP: 102/80  Pulse: 72  Temp: 98.1 F (36.7 C)    General appearance: alert, no distress, WD/WN Heart: RRR, normal S1, S2, no murmurs Lungs: CTA bilaterally, no wheezes, rhonchi, or rales Abdomen: +bs, soft, +mild to moderate epigastric tenderness, mild RUQ tenderness, otherwise non tender, non distended, no masses, no hepatomegaly, no splenomegaly Pulses: 2+ symmetric, upper and lower extremities, normal cap refill Back: nontender  Assessment: Encounter Diagnoses  Name Primary?  . Diarrhea Yes  . Non-intractable cyclical vomiting with nausea   . Gastroenteritis     Plan: Given time frame and symptoms, likely viral gastroenteritis, however she does have recent antibiotic use, RUQ pain, so can't rule other C diff or gall bladder disease.  Gave her options.  For now, limit solid food intake, particular spicy greasy foods, push clear fluids, rest, can use Zofran for nausea and vomiting.  STOP imodium.   Can use Pepto Bismol if  desired.   If no improvement in 24-48 hours, we can move forward with labs and stool studies, primarily O&P and C diff.  She agrees with plan.

## 2014-06-13 ENCOUNTER — Ambulatory Visit (INDEPENDENT_AMBULATORY_CARE_PROVIDER_SITE_OTHER): Payer: 59 | Admitting: Medical

## 2014-06-13 VITALS — BP 112/80 | HR 83 | Temp 98.2°F | Resp 16

## 2014-06-13 DIAGNOSIS — Z658 Other specified problems related to psychosocial circumstances: Secondary | ICD-10-CM

## 2014-06-13 DIAGNOSIS — F439 Reaction to severe stress, unspecified: Secondary | ICD-10-CM

## 2014-06-13 DIAGNOSIS — R109 Unspecified abdominal pain: Secondary | ICD-10-CM

## 2014-06-13 DIAGNOSIS — K529 Noninfective gastroenteritis and colitis, unspecified: Secondary | ICD-10-CM

## 2014-06-13 DIAGNOSIS — R197 Diarrhea, unspecified: Secondary | ICD-10-CM

## 2014-06-13 LAB — CBC WITH DIFFERENTIAL/PLATELET
Basophils Absolute: 0 10*3/uL (ref 0.0–0.1)
Basophils Relative: 0 % (ref 0–1)
Eosinophils Absolute: 0.2 10*3/uL (ref 0.0–0.7)
Eosinophils Relative: 2 % (ref 0–5)
HCT: 39.8 % (ref 36.0–46.0)
Hemoglobin: 14 g/dL (ref 12.0–15.0)
Lymphocytes Relative: 26 % (ref 12–46)
Lymphs Abs: 2.9 10*3/uL (ref 0.7–4.0)
MCH: 29.8 pg (ref 26.0–34.0)
MCHC: 35.2 g/dL (ref 30.0–36.0)
MCV: 84.7 fL (ref 78.0–100.0)
Monocytes Absolute: 1.1 10*3/uL — ABNORMAL HIGH (ref 0.1–1.0)
Monocytes Relative: 10 % (ref 3–12)
Neutro Abs: 6.8 10*3/uL (ref 1.7–7.7)
Neutrophils Relative %: 62 % (ref 43–77)
Platelets: 374 10*3/uL (ref 150–400)
RBC: 4.7 MIL/uL (ref 3.87–5.11)
RDW: 13.1 % (ref 11.5–15.5)
WBC: 11 10*3/uL — ABNORMAL HIGH (ref 4.0–10.5)

## 2014-06-13 LAB — BASIC METABOLIC PANEL
BUN: 6 mg/dL (ref 6–23)
CO2: 29 mEq/L (ref 19–32)
Calcium: 9.2 mg/dL (ref 8.4–10.5)
Chloride: 100 mEq/L (ref 96–112)
Creat: 0.66 mg/dL (ref 0.50–1.10)
Glucose, Bld: 88 mg/dL (ref 70–99)
Potassium: 3.1 mEq/L — ABNORMAL LOW (ref 3.5–5.3)
Sodium: 136 mEq/L (ref 135–145)

## 2014-06-13 LAB — HEPATIC FUNCTION PANEL
ALT: 28 U/L (ref 0–35)
AST: 26 U/L (ref 0–37)
Albumin: 4.2 g/dL (ref 3.5–5.2)
Alkaline Phosphatase: 98 U/L (ref 39–117)
Bilirubin, Direct: 0.2 mg/dL (ref 0.0–0.3)
Indirect Bilirubin: 0.4 mg/dL (ref 0.2–1.2)
Total Bilirubin: 0.6 mg/dL (ref 0.2–1.2)
Total Protein: 6.9 g/dL (ref 6.0–8.3)

## 2014-06-13 MED ORDER — AZITHROMYCIN 250 MG PO TABS: ORAL_TABLET | ORAL | Status: DC

## 2014-06-13 MED ORDER — OMEPRAZOLE 40 MG PO CPDR: 40.0000 mg | DELAYED_RELEASE_CAPSULE | Freq: Two times a day (BID) | ORAL | Status: DC

## 2014-06-13 MED ORDER — METRONIDAZOLE 500 MG PO TABS: 500.0000 mg | ORAL_TABLET | Freq: Three times a day (TID) | ORAL | Status: DC

## 2014-06-13 NOTE — Progress Notes (Signed)
Subjective: Here for recheck.  I saw her earlier in the week with same.  Sick since last Wednesday evening 9 days ago.  Initially had diarrhea, vomiting, fever, abdominal pain, chills, night sweats, achy.   Since last visit using zofran and pepto bismol, now nausea chills aches gone, but still having 5+ foul smelling gray to dark brown stool, watery and loose.  No obvious blood , no fever,  Still having lots of belly pain.  Under a lot of stress.  Teenage daughter in the hospital currently in psychiatric care.  Ongoing family/home stress.  Was on amoxicillin several weeks ago for respiratory infection.  No out of town or country travel.  Does have dogs at home, numerous.  No concern for food poisoning.  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   No other aggravating or relieving factors.     ROS as in subjective  Objective: Filed Vitals:   06/13/14 1202  BP: 112/80  Pulse: 83  Temp: 98.2 F (36.8 C)  Resp: 16    General appearance: alert, no distress, WD/WN Heart: RRR, normal S1, S2, no murmurs Lungs: CTA bilaterally, no wheezes, rhonchi, or rales Abdomen: +bs, soft, +mild to moderate epigastric tenderness, mild RUQ tenderness, otherwise non tender, non distended, no masses, no hepatomegaly, no splenomegaly Pulses: 2+ symmetric, upper and lower extremities, normal cap refill Back: nontender  Assessment: Encounter Diagnoses  Name Primary?  . Gastroenteritis or enteritis Yes  . Diarrhea   . Abdominal pain of unknown etiology   . Stress     Plan: Stat labs today, and will begin Omeprazole for possible stress ulcer, begin Flagyl and Zpak for enteritis, hydrate well, practice good hygiene, and will call with lab results.

## 2014-06-18 ENCOUNTER — Ambulatory Visit (INDEPENDENT_AMBULATORY_CARE_PROVIDER_SITE_OTHER): Payer: 59 | Admitting: Medical

## 2014-06-18 ENCOUNTER — Encounter: Payer: Self-pay | Admitting: Medical

## 2014-06-18 VITALS — BP 110/70 | HR 72 | Temp 98.5°F | Resp 16

## 2014-06-18 DIAGNOSIS — R1011 Right upper quadrant pain: Secondary | ICD-10-CM

## 2014-06-18 DIAGNOSIS — R1013 Epigastric pain: Secondary | ICD-10-CM

## 2014-06-18 DIAGNOSIS — R197 Diarrhea, unspecified: Secondary | ICD-10-CM

## 2014-06-18 MED ORDER — SUCRALFATE 1 G PO TABS: 1.0000 g | ORAL_TABLET | Freq: Three times a day (TID) | ORAL | Status: DC

## 2014-06-18 NOTE — Progress Notes (Signed)
Subjective: Here for recheck..  I saw her earlier in the week with same.  Overall improving, diarrhea definitely improving, taking the antibiotics, omeprazole.  However today had single episode of severe RUQ pain that radiated around to her right side and shoulder pain.  This only happened once, lasted for minutes and resolved.   Wanted to recheck on this.   Sick since last Wednesday.  Initially had diarrhea, vomiting, fever, abdominal pain, chills, night sweats, achy.  No out of town or country travel.  Does have dogs at home, numerous.  No concern for food poisoning.  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   No other aggravating or relieving factors.     ROS as in subjective  Objective: Filed Vitals:   06/18/14 1032  BP: 110/70  Pulse: 72  Temp: 98.5 F (36.9 C)  Resp: 16    General appearance: alert, no distress, WD/WN Heart: RRR, normal S1, S2, no murmurs Lungs: CTA bilaterally, no wheezes, rhonchi, or rales Abdomen: +bs, soft, +mild to moderate epigastric tenderness, mild RUQ tenderness, otherwise non tender, non distended, no masses, no hepatomegaly, no splenomegaly Pulses: 2+ symmetric, upper and lower extremities, normal cap refill Back: nontender  Assessment: Encounter Diagnoses  Name Primary?  . Abdominal pain, right upper quadrant Yes  . Diarrhea   . Abdominal pain, epigastric     Plan: Discussed symptoms.  Consider abdominal US, GI referral.  Declines at this time, reviewed recent labs, c/t medications from last visit, PPI, antibiotics.  If any additional severe RUQ pain, willl get [place] to further eval for gall bladder disease

## 2014-06-23 ENCOUNTER — Encounter: Payer: Self-pay | Admitting: Medical

## 2014-08-20 ENCOUNTER — Ambulatory Visit (INDEPENDENT_AMBULATORY_CARE_PROVIDER_SITE_OTHER): Payer: 59 | Admitting: Family Medicine

## 2014-08-20 ENCOUNTER — Encounter: Payer: Self-pay | Admitting: Family Medicine

## 2014-08-20 VITALS — BP 110/72 | HR 92 | Temp 100.0°F | Ht 64.0 in | Wt 168.0 lb

## 2014-08-20 DIAGNOSIS — Z5181 Encounter for therapeutic drug level monitoring: Secondary | ICD-10-CM

## 2014-08-20 DIAGNOSIS — E785 Hyperlipidemia, unspecified: Secondary | ICD-10-CM

## 2014-08-20 DIAGNOSIS — B351 Tinea unguium: Secondary | ICD-10-CM

## 2014-08-20 DIAGNOSIS — H9202 Otalgia, left ear: Secondary | ICD-10-CM

## 2014-08-20 MED ORDER — TERBINAFINE HCL 250 MG PO TABS
250.0000 mg | ORAL_TABLET | Freq: Every day | ORAL | Status: DC
Start: 2014-08-20 — End: 2015-11-23

## 2014-08-20 NOTE — Patient Instructions (Signed)
Ringworm, Nail A fungal infection of the nail (tinea unguium/onychomycosis) is common. It is common as the visible part of the nail is composed of dead cells which have no blood supply to help prevent infection. It occurs because fungi are everywhere and will pick any opportunity to grow on any dead material. Because nails are very slow growing they require up to 2 years of treatment with anti-fungal medications. The entire nail back to the base is infected. This includes approximately  of the nail which you cannot see. If your caregiver has prescribed a medication by mouth, take it every day and as directed. No progress will be seen for at least 6 to 9 months. Do not be disappointed! Because fungi live on dead cells with little or no exposure to blood supply, medication delivery to the infection is slow; thus the cure is slow. It is also why you can observe no progress in the first 6 months. The nail becoming cured is the base of the nail, as it has the blood supply. Topical medication such as creams and ointments are usually not effective. Important in successful treatment of nail fungus is closely following the medication regimen that your doctor prescribes. Sometimes you and your caregiver may elect to speed up this process by surgical removal of all the nails. Even this may still require 6 to 9 months of additional oral medications. See your caregiver as directed. Remember there will be no visible improvement for at least 6 months. See your caregiver sooner if other signs of infection (redness and swelling) develop. Document Released: 08/05/2000 Document Revised: 10/31/2011 Document Reviewed: 10/14/2008 [facility] Patient Information 2015 [place], [place]. This information is not intended to replace advice given to you by your health care provider. Make sure you discuss any questions you have with your health care provider.   Soak toenails twice daily, and train the edges of the nail up and over the  skin. Keep the nails trimmed short to prevent chronic trauma and lifting of the nail (but make sure to cut the nail straight across).  (do not cut the toenails until we find out whether or not a fungal culture is needed to get the lamisil approved).

## 2014-08-20 NOTE — Progress Notes (Signed)
Chief Complaint  Patient presents with  . Ear Pain    left ear pain off and on x 1 week. Slight cough.   . Nail Problem    mostly on great toe but on all toenails. Fungus is so bad that they have now become painful.    Left ear pain off/on since before Christmas.  Pain is sharp, stabbing, short-lived.  Denies popping or plugging, but has had some intermittent ringing.  Slight head congestion, but not much.  Slight cough with some postnasal drainage, but not discolored. No known fevers.  +sick contacts (younger daughter had a cold over Christmas, older daughter with sore throat).  Several years ago she noticed discoloration in her toenails.  She has been keeping her toenails covered with polish constantly, but took off the polish when they became painful. She had been hoping the discoloration would "go away on its own".  She has had some discomfort develop develop in both great toes, L>R in the last couple of weeks.  It hurts to put any pressure on the toenail (mainly the left great toenail, in the center).  The nail seems to be lifting, as well as separating, "flaking".  No known injury or trauma. Denies redness, swelling, warmth, drainage.  Hyperlipidemia:  She admits to not having labwork done in a long time.  Chart reviewed--she had labs done with illness in October, which included LFT's. She hasn't had lipids checked (despite being on lipitor--?if rx'd by GYN, I don't see any rx's in our chart) since 12/2011. She denies side effects.  She is not fasting today.  Past Medical History  Diagnosis Date  . Hyperlipidemia   . Anticardiolipin antibody positive   . Hypertension     diet controlled  . Renal calculus, right   . HSV-2 (herpes simplex virus 2) infection    Past Surgical History  Procedure Laterality Date  . Cesarean section      X3   LAST ONE 11-30-2004  . Cystoscopy w/ retrogrades Right 10/29/2012    Procedure: CYSTOSCOPY WITH RETROGRADE PYELOGRAM and stent placement;  Surgeon:  Reece Packer, MD;  Location: [facility];  Service: Urology;  Laterality: Right;  rt stent placement , rt retrograde and cysto   . Cystoscopy/retrograde/ureteroscopy/stone extraction with basket Right 11/23/2012    Procedure: CYSTOSCOPY/RETROGRADE/URETEROSCOPY/STONE EXTRACTION WITH BASKET;  Surgeon: Alexis Frock, MD;  Location: [facility];  Service: Urology;  Laterality: Right;  . Cystoscopy w/ ureteral stent placement Right 11/23/2012    Procedure: CYSTOSCOPY WITH STENT REPLACEMENT;  Surgeon: Alexis Frock, MD;  Location: [facility];  Service: Urology;  Laterality: Right;   History   Social History  . Marital Status: Single    Spouse Name: N/A    Number of Children: N/A  . Years of Education: N/A   Occupational History  . Not on file.   Social History Main Topics  . Smoking status: Current Every Day Smoker -- 0.50 packs/day for 8 years    Types: Cigarettes  . Smokeless tobacco: Never Used  . Alcohol Use: No  . Drug Use: No  . Sexual Activity: Yes    Birth Control/ Protection: IUD   Other Topics Concern  . Not on file   Social History Narrative   Current Outpatient Prescriptions on File Prior to Visit  Medication Sig Dispense Refill  . atorvastatin (LIPITOR) 20 MG tablet Take 20 mg by mouth at bedtime.     Marland Kitchen levonorgestrel (MIRENA) 20 MCG/24HR IUD 1  each by Intrauterine route once.    . Multiple Vitamin (MULTIVITAMIN WITH MINERALS) TABS Take 1 tablet by mouth daily.    Marland Kitchen ibuprofen (ADVIL,MOTRIN) 200 MG tablet Take 200 mg by mouth as needed for pain.    Marland Kitchen ondansetron (ZOFRAN) 4 MG tablet Take 1 tablet (4 mg total) by mouth every 8 (eight) hours as needed for nausea or vomiting. (Patient not taking: Reported on 08/20/2014) 20 tablet 0  . sucralfate (CARAFATE) 1 G tablet Take 1 tablet (1 g total) by mouth 4 (four) times daily -  with meals and at bedtime. (Patient not taking: Reported on 08/20/2014) 30 tablet 0   No current  facility-administered medications on file prior to visit.  (not taking Zofran)  Allergies  Allergen Reactions  . Imitrex [Sumatriptan Base] Anaphylaxis  . Zomig Anaphylaxis    Throat swelling   . Avelox [Moxifloxacin Hcl In Nacl] Hives  . Morphine Nausea And Vomiting  . Septra [Bactrim] Hives   ROS:  No fevers, chills, headache, dizziness, sore throat, sinus pain, shortness of breath, nausea, vomiting, abdominal pain, bleeding, bruising, rash, edema.  See HPI.  PHYSICAL EXAM: BP 110/72 mmHg  Pulse 92  Temp(Src) 100 F (37.8 C) (Tympanic)  Ht 5\' 4"  (1.626 m)  Wt 168 lb (76.204 kg)  BMI 28.82 kg/m2  Well developed, pleasant female with occasional sniffle/throat-clear, in no distress HEENT: PERRL, conjunctiva clear.  TM's and EACs are normal bilaterally.  Some scarring noted on the L TM.  Nasal mucosa is moderately edematous, no purulence.  OP is clear; sinuses nontender.  Some clicking at TMJ's, but nontender. Neck: no lymphadenopathy or mass Heart: regular rate and rhythm, no murmur Lungs: clear Abdomen: soft, nontender, no organomegaly or mass  Toenails:  They all have an orange-yellow hue distally on the nails; polish was just recently removed, and some residual red polish is present.  There is white discoloration and some thickening of both great toenails, and the third and 5th on the left, and the 5th on the right are also thickened (mildly) and discolored.  There is only pain with palpation over the distal left great toenail. There is some separation of the toeil from the nail bed, as well as flaking/layering of the nail itself. There is no tenderness at nail edges on either great toe, although the skin is keratotic (thickened/hard), and nail seems to be aiming under the skin.  There is no redness/warmth/swelling  Lab Results  Component Value Date   ALT 28 06/13/2014   AST 26 06/13/2014   ALKPHOS 98 06/13/2014   BILITOT 0.6 06/13/2014   Lab Results  Component Value Date    CHOL 194 01/03/2012   HDL 45 01/03/2012   LDLCALC 96 01/03/2012   TRIG 263* 01/03/2012   CHOLHDL 4.3 01/03/2012   ASSESSMENT/PLAN  Onychomycosis - symptomatic with pain at L>R great toenail. recent LFT's normal.  start treatment. recheck LFT's 6 weeks. Expected course reviewed - Plan: Hepatic function panel, terbinafine (LAMISIL) 250 MG tablet  Medication monitoring encounter - Plan: Hepatic function panel  Hyperlipidemia - long past due for fasting lipids.  she will return in 6 weeks for LFT's fasting, and lipids will also be drawn - Plan: Lipid panel, Hepatic function panel  Otalgia, left - reassured no evidence of infection.  tobacco cessation encouraged.  Risks and side effects of lamisil were reviewed.  She is having pain, so not just cosmetic.  May need clipping if needed for prior auth, so hold off on clipping  nail until we find out if fungal culture is needed.  Past due for lipid f/u (on lipitor).  LFT's recently checked and normal.  Soak toenails twice daily, and train the edges of the nail up and over the skin. Keep the nails trimmed short to prevent chronic trauma and lifting of the nail (but make sure to cut the nail straight across).  Otalgia--no evidence of ear infection.  May have some eustachian tube dysfunction.  Decongestants prn.  Return in 6 weeks for LFT's--do fasting and lipids along with it since past due.

## 2014-10-10 ENCOUNTER — Other Ambulatory Visit: Payer: 59

## 2014-10-10 DIAGNOSIS — E785 Hyperlipidemia, unspecified: Secondary | ICD-10-CM

## 2014-10-10 DIAGNOSIS — B351 Tinea unguium: Secondary | ICD-10-CM

## 2014-10-10 DIAGNOSIS — Z5181 Encounter for therapeutic drug level monitoring: Secondary | ICD-10-CM

## 2014-10-10 LAB — HEPATIC FUNCTION PANEL
ALT: 10 U/L (ref 0–35)
AST: 14 U/L (ref 0–37)
Albumin: 4.3 g/dL (ref 3.5–5.2)
Alkaline Phosphatase: 102 U/L (ref 39–117)
Bilirubin, Direct: 0.1 mg/dL (ref 0.0–0.3)
Indirect Bilirubin: 0.6 mg/dL (ref 0.2–1.2)
Total Bilirubin: 0.7 mg/dL (ref 0.2–1.2)
Total Protein: 6.8 g/dL (ref 6.0–8.3)

## 2014-10-10 LAB — LIPID PANEL
Cholesterol: 193 mg/dL (ref 0–200)
HDL: 53 mg/dL (ref >39)
LDL Cholesterol: 114 mg/dL — ABNORMAL HIGH (ref 0–99)
Total CHOL/HDL Ratio: 3.6 Ratio
Triglycerides: 130 mg/dL (ref <150)
VLDL: 26 mg/dL (ref 0–40)

## 2014-10-13 ENCOUNTER — Other Ambulatory Visit: Payer: Self-pay | Admitting: *Deleted

## 2014-10-13 MED ORDER — ATORVASTATIN CALCIUM 20 MG PO TABS: 20.0000 mg | ORAL_TABLET | Freq: Every day | ORAL | Status: DC

## 2015-01-11 ENCOUNTER — Emergency Department (HOSPITAL_COMMUNITY): Payer: 59

## 2015-01-11 ENCOUNTER — Emergency Department (INDEPENDENT_AMBULATORY_CARE_PROVIDER_SITE_OTHER)
Admission: EM | Admit: 2015-01-11 | Discharge: 2015-01-11 | Disposition: A | Discharge status: Nursing Home (Includes ICF) | Payer: 59 | Source: Home / Self Care | Attending: Family Medicine | Admitting: Family Medicine

## 2015-01-11 ENCOUNTER — Encounter (HOSPITAL_COMMUNITY): Payer: Self-pay | Admitting: *Deleted

## 2015-01-11 ENCOUNTER — Encounter (HOSPITAL_COMMUNITY): Payer: Self-pay | Admitting: Emergency Medicine

## 2015-01-11 ENCOUNTER — Emergency Department (HOSPITAL_COMMUNITY)
Admission: EM | Admit: 2015-01-11 | Discharge: 2015-01-11 | Disposition: A | Discharge status: Home / Self Care | Payer: 59 | Attending: Emergency Medicine | Admitting: Emergency Medicine

## 2015-01-11 DIAGNOSIS — Z720 Tobacco use: Secondary | ICD-10-CM | POA: Diagnosis not present

## 2015-01-11 DIAGNOSIS — E785 Hyperlipidemia, unspecified: Secondary | ICD-10-CM | POA: Diagnosis not present

## 2015-01-11 DIAGNOSIS — I10 Essential (primary) hypertension: Secondary | ICD-10-CM | POA: Diagnosis not present

## 2015-01-11 DIAGNOSIS — Z79899 Other long term (current) drug therapy: Secondary | ICD-10-CM | POA: Diagnosis not present

## 2015-01-11 DIAGNOSIS — Z87442 Personal history of urinary calculi: Secondary | ICD-10-CM | POA: Diagnosis not present

## 2015-01-11 DIAGNOSIS — Z8619 Personal history of other infectious and parasitic diseases: Secondary | ICD-10-CM | POA: Diagnosis not present

## 2015-01-11 DIAGNOSIS — I249 Acute ischemic heart disease, unspecified: Secondary | ICD-10-CM | POA: Diagnosis not present

## 2015-01-11 DIAGNOSIS — R079 Chest pain, unspecified: Secondary | ICD-10-CM | POA: Diagnosis not present

## 2015-01-11 LAB — BASIC METABOLIC PANEL
Anion gap: 6 (ref 5–15)
BUN: 9 mg/dL (ref 6–20)
CO2: 26 mmol/L (ref 22–32)
Calcium: 8.8 mg/dL — ABNORMAL LOW (ref 8.9–10.3)
Chloride: 106 mmol/L (ref 101–111)
Creatinine, Ser: 0.66 mg/dL (ref 0.44–1.00)
GFR calc Af Amer: >60 mL/min (ref >60)
GFR calc non Af Amer: >60 mL/min (ref >60)
Glucose, Bld: 108 mg/dL — ABNORMAL HIGH (ref 65–99)
Potassium: 3.5 mmol/L (ref 3.5–5.1)
Sodium: 138 mmol/L (ref 135–145)

## 2015-01-11 LAB — CBC
HCT: 36.8 % (ref 36.0–46.0)
Hemoglobin: 12.6 g/dL (ref 12.0–15.0)
MCH: 30.2 pg (ref 26.0–34.0)
MCHC: 34.2 g/dL (ref 30.0–36.0)
MCV: 88.2 fL (ref 78.0–100.0)
Platelets: 249 10*3/uL (ref 150–400)
RBC: 4.17 MIL/uL (ref 3.87–5.11)
RDW: 12.7 % (ref 11.5–15.5)
WBC: 8.6 10*3/uL (ref 4.0–10.5)

## 2015-01-11 LAB — I-STAT TROPONIN, ED: Troponin i, poc: 0 ng/mL (ref 0.00–0.08)

## 2015-01-11 MED ORDER — NITROGLYCERIN 0.4 MG SL SUBL: 0.4000 mg | SUBLINGUAL_TABLET | SUBLINGUAL | Status: DC | PRN

## 2015-01-11 MED ORDER — NITROGLYCERIN 0.4 MG SL SUBL
SUBLINGUAL_TABLET | SUBLINGUAL | Status: AC
  Filled 2015-01-11: qty 1

## 2015-01-11 MED ORDER — ASPIRIN 81 MG PO CHEW
CHEWABLE_TABLET | ORAL | Status: AC
  Filled 2015-01-11: qty 4

## 2015-01-11 MED ORDER — ASPIRIN 81 MG PO CHEW
324.0000 mg | CHEWABLE_TABLET | Freq: Once | ORAL | Status: AC
  Administered 2015-01-11: 324 mg via ORAL

## 2015-01-11 MED ORDER — SODIUM CHLORIDE 0.9 % IV SOLN
Freq: Once | INTRAVENOUS | Status: AC
  Administered 2015-01-11: 17:00:00 via INTRAVENOUS

## 2015-01-11 NOTE — Discharge Instructions (Signed)

## 2015-01-11 NOTE — ED Notes (Signed)
Per EMS- pt began having left chest pain that radiated to left shoulder while eating lunch today. Pt went to Urgent care and was given 324mg  of asprin and noticed to have t wave changes. Pt sent here for eval. Pt received 1 nitro with EMS. Pt reports relief of chest pain however shoulder pain continues.

## 2015-01-11 NOTE — ED Provider Notes (Addendum)
Kelly [name] is a 49 y.o. female who presents to Urgent Care today for chest pain. Patient developed left-sided chest pain radiating to her left shoulder and arm this afternoon. She has mild dizziness. She denies any palpitations. She does not think the pain is worse with arm motion. She's not sure about the exertional component of her pain as she has not tried to exert herself yet. She tried some ibuprofen which did not help. She does note mild nausea. No fevers or chills. She has a history of hyperlipidemia that she controls with atorvastatin.   Past Medical History  Diagnosis Date  . Hyperlipidemia   . Anticardiolipin antibody positive   . Hypertension     diet controlled  . Renal calculus, right   . HSV-2 (herpes simplex virus 2) infection    Past Surgical History  Procedure Laterality Date  . Cesarean section      X3   LAST ONE 11-30-2004  . Cystoscopy w/ retrogrades Right 10/29/2012    Procedure: CYSTOSCOPY WITH RETROGRADE PYELOGRAM and stent placement;  Surgeon: Reece Packer, MD;  Location: [facility];  Service: Urology;  Laterality: Right;  rt stent placement , rt retrograde and cysto   . Cystoscopy/retrograde/ureteroscopy/stone extraction with basket Right 11/23/2012    Procedure: CYSTOSCOPY/RETROGRADE/URETEROSCOPY/STONE EXTRACTION WITH BASKET;  Surgeon: Alexis Frock, MD;  Location: [facility];  Service: Urology;  Laterality: Right;  . Cystoscopy w/ ureteral stent placement Right 11/23/2012    Procedure: CYSTOSCOPY WITH STENT REPLACEMENT;  Surgeon: Alexis Frock, MD;  Location: [facility];  Service: Urology;  Laterality: Right;   History  Substance Use Topics  . Smoking status: Current Every Day Smoker -- 0.50 packs/day for 8 years    Types: Cigarettes  . Smokeless tobacco: Never Used  . Alcohol Use: No   ROS as above Medications: Current Facility-Administered Medications  Medication Dose Route Frequency Provider Last Rate  Last Dose  . 0.9 %  sodium chloride infusion   Intravenous Once Gregor Hams, MD      . aspirin chewable tablet 324 mg  324 mg Oral Once Gregor Hams, MD      . nitroGLYCERIN (NITROSTAT) SL tablet 0.4 mg  0.4 mg Sublingual Q5 min PRN Gregor Hams, MD       Current Outpatient Prescriptions  Medication Sig Dispense Refill  . atorvastatin (LIPITOR) 20 MG tablet Take 1 tablet (20 mg total) by mouth at bedtime. 90 tablet 1  . ibuprofen (ADVIL,MOTRIN) 200 MG tablet Take 200 mg by mouth as needed for pain.    Marland Kitchen levonorgestrel (MIRENA) 20 MCG/24HR IUD 1 each by Intrauterine route once.    . Multiple Vitamin (MULTIVITAMIN WITH MINERALS) TABS Take 1 tablet by mouth daily.    . ondansetron (ZOFRAN) 4 MG tablet Take 1 tablet (4 mg total) by mouth every 8 (eight) hours as needed for nausea or vomiting. (Patient not taking: Reported on 08/20/2014) 20 tablet 0  . sucralfate (CARAFATE) 1 G tablet Take 1 tablet (1 g total) by mouth 4 (four) times daily -  with meals and at bedtime. (Patient not taking: Reported on 08/20/2014) 30 tablet 0  . terbinafine (LAMISIL) 250 MG tablet Take 1 tablet (250 mg total) by mouth daily. 30 tablet 2   Allergies  Allergen Reactions  . Imitrex [Sumatriptan Base] Anaphylaxis  . Zomig Anaphylaxis    Throat swelling   . Avelox [Moxifloxacin Hcl In Nacl] Hives  . Morphine Nausea And Vomiting  .  Septra [Bactrim] Hives     Exam:  BP 134/83 mmHg  Pulse 96  Temp(Src) 99.5 F (37.5 C) (Oral)  Resp 16  SpO2 99% Gen: Well NAD HEENT: EOMI,  MMM Lungs: Normal work of breathing. CTABL Heart: RRR no MRG Abd: NABS, Soft. Nondistended, Nontender Exts: Brisk capillary refill, warm and well perfused.  Shoulders bilaterally have normal range of motion and are nontender. Negative impingement testing bilaterally. Normal shoulder strength bilaterally.  Patient was given 324 mg of oral aspirin.  IV fluids were started. Nitroglycerin was not provided because blood pressure was 106/68  on recheck and pain was improved at 3/10.   ED ECG REPORT   Date: 01/11/2015  Rate: 90  Rhythm: normal sinus rhythm  QRS Axis: normal  Intervals: normal  ST/T Wave abnormalities: nonspecific T wave changes and Flattened lateral precordial T waves  Conduction Disutrbances:none  Narrative Interpretation:   Old EKG Reviewed: changes noted and T wave changes are new compared to ECG dated 10/28/12  I have personally reviewed the EKG tracing and agree with the computerized printout as noted.   No results found for this or any previous visit (from the past 24 hour(s)). No results found.  Assessment and Plan: 49 y.o. female with chest pain. Patient is chest pain radiating to the left arm with a history of hyperlipidemia. This is concerning for acute coronary syndrome. Medications above provided. Patient transferred to the emergency department via EMS for further evaluation and management.  Discussed warning signs or symptoms. Please see discharge instructions. Patient expresses understanding.     Gregor Hams, MD 01/11/15 [place], MD 01/11/15 [phone]

## 2015-01-11 NOTE — ED Notes (Signed)
Reports chest pain onset approx 1 hour ago-3:30 pm.  Patient was driving .  Pain in left shoulder, left chest extending to just below left breast and pain across left shoulder blade.  Reports mild nausea, no sob, pain in chest,back, shoulder is sharp.  Left arm is weak/numb.

## 2015-01-11 NOTE — ED Notes (Signed)
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

## 2015-01-11 NOTE — ED Provider Notes (Signed)
CSN: [id number]     Arrival date & time 01/11/15  1737 History   First MD Initiated Contact with Patient 01/11/15 1759     Chief Complaint  Patient presents with  . Chest Pain     (Consider location/radiation/quality/duration/timing/severity/associated sxs/prior Treatment) HPI Comments: 49 year old female transferred from urgent care with chest pain. Patient states she had chest pain which developed this afternoon. Also describes some radiation of this pain down her left shoulder. Also has some associated dizziness. Urgent care noted some changes to EKG and was transferred here for further management  Patient is a 49 y.o. female presenting with chest pain. The history is provided by the patient.  Chest Pain Pain location:  L chest Pain quality: aching   Radiates to: Left shoulder. Pain radiates to the back: no   Pain severity:  Moderate Onset quality:  Sudden Duration:  4 hours Timing:  Constant Progression:  Unchanged Chronicity:  New Associated symptoms: no abdominal pain, no back pain, no fever and no headache     Past Medical History  Diagnosis Date  . Hyperlipidemia   . Anticardiolipin antibody positive   . Hypertension     diet controlled  . Renal calculus, right   . HSV-2 (herpes simplex virus 2) infection    Past Surgical History  Procedure Laterality Date  . Cesarean section      X3   LAST ONE 11-30-2004  . Cystoscopy w/ retrogrades Right 10/29/2012    Procedure: CYSTOSCOPY WITH RETROGRADE PYELOGRAM and stent placement;  Surgeon: Reece Packer, MD;  Location: [place];  Service: Urology;  Laterality: Right;  rt stent placement , rt retrograde and cysto   . Cystoscopy/retrograde/ureteroscopy/stone extraction with basket Right 11/23/2012    Procedure: CYSTOSCOPY/RETROGRADE/URETEROSCOPY/STONE EXTRACTION WITH BASKET;  Surgeon: Alexis Frock, MD;  Location: [facility];  Service: Urology;  Laterality: Right;  . Cystoscopy w/ ureteral  stent placement Right 11/23/2012    Procedure: CYSTOSCOPY WITH STENT REPLACEMENT;  Surgeon: Alexis Frock, MD;  Location: [facility];  Service: Urology;  Laterality: Right;   Family History  Problem Relation Age of Onset  . Diabetes Father   . Hypertension Father   . Cancer Father     colon cancer  . Kidney Stones Father   . Heart disease Mother   . Breast cancer Maternal Grandmother    History  Substance Use Topics  . Smoking status: Current Every Day Smoker -- 0.50 packs/day for 8 years    Types: Cigarettes  . Smokeless tobacco: Never Used  . Alcohol Use: No   OB History    Gravida Para Term Preterm AB TAB SAB Ectopic Multiple Living   4 3 3  1     3      Review of Systems  Constitutional: Negative for fever.  HENT: Negative for congestion.   Cardiovascular: Positive for chest pain.  Gastrointestinal: Negative for abdominal pain.  Musculoskeletal: Negative for back pain.  Skin: Negative for rash.  Neurological: Negative for headaches.  Psychiatric/Behavioral: Negative for confusion.  All other systems reviewed and are negative.     Allergies  Imitrex; Zomig; Avelox; Morphine; and Septra  Home Medications   Prior to Admission medications   Medication Sig Start Date End Date Taking? Authorizing Provider  atorvastatin (LIPITOR) 20 MG tablet Take 1 tablet (20 mg total) by mouth at bedtime. 10/13/14   Rita Ohara, MD  ibuprofen (ADVIL,MOTRIN) 200 MG tablet Take 200 mg by mouth as needed for pain.  Historical Provider, MD  levonorgestrel (MIRENA) 20 MCG/24HR IUD 1 each by Intrauterine route once.    Historical Provider, MD  Multiple Vitamin (MULTIVITAMIN WITH MINERALS) TABS Take 1 tablet by mouth daily.    Historical Provider, MD  ondansetron (ZOFRAN) 4 MG tablet Take 1 tablet (4 mg total) by mouth every 8 (eight) hours as needed for nausea or vomiting. Patient not taking: Reported on 08/20/2014 06/09/14   Camelia Eng Tysinger, PA-C  sucralfate (CARAFATE) 1 G  tablet Take 1 tablet (1 g total) by mouth 4 (four) times daily -  with meals and at bedtime. Patient not taking: Reported on 08/20/2014 06/18/14   Camelia Eng Tysinger, PA-C  terbinafine (LAMISIL) 250 MG tablet Take 1 tablet (250 mg total) by mouth daily. 08/20/14   Rita Ohara, MD   BP 120/73 mmHg  Pulse 82  Temp(Src) 98.9 F (37.2 C) (Oral)  Resp 26  SpO2 96% Physical Exam  Constitutional: She is oriented to person, place, and time. She appears well-developed and well-nourished.  HENT:  Head: Normocephalic.  Eyes: Pupils are equal, round, and reactive to light.  Neck: Normal range of motion.  Cardiovascular: Normal rate and intact distal pulses.   No murmur heard. Pulmonary/Chest: Effort normal. No respiratory distress.  Abdominal: Soft. She exhibits no distension. There is no tenderness.  Musculoskeletal: She exhibits no edema.  Neurological: She is alert and oriented to person, place, and time. No cranial nerve deficit. She exhibits normal muscle tone.  Skin: Skin is warm.  Psychiatric: She has a normal mood and affect.  Vitals reviewed.   ED Course  Procedures (including critical care time) Labs Review Labs Reviewed  BASIC METABOLIC PANEL - Abnormal; Notable for the following:    Glucose, Bld 108 (*)    Calcium 8.8 (*)    All other components within normal limits  CBC  I-STAT TROPOININ, ED    Imaging Review Dg Chest 2 View  01/11/2015   CLINICAL DATA:  LEFT shoulder, LEFT chest pain beginning at noon today. Ten year smoking history.  EXAM: CHEST  2 VIEW  COMPARISON:  Chest radiograph October 28, 2012  FINDINGS: Cardiomediastinal silhouette is unremarkable. The lungs are clear without pleural effusions or focal consolidations. Trachea projects midline and there is no pneumothorax. Soft tissue planes and included osseous structures are non-suspicious.  IMPRESSION: Normal chest.   Electronically Signed   By: Elon Alas   On: 01/11/2015 18:26     EKG  Interpretation   Date/Time:  Sunday Jan 11 2015 17:40:14 EDT Ventricular Rate:  83 PR Interval:  155 QRS Duration: 91 QT Interval:  380 QTC Calculation: 446 R Axis:   68 Text Interpretation:  Sinus rhythm Borderline repolarization abnormality  No significant change since last tracing Confirmed by Ashok Cordia  MD, Lennette Bihari  ([id number]) on 01/11/2015 6:33:11 PM      MDM  49 year old female complaining of chest pain appears to be atypical chest pain. Initially was pain in her shoulder and then became chest pain. Her vitals are normal throughout time in the emergency department. She has minimal cardiac risk factors. Hear score low. Initially sent from urgent care with concern for T-wave flattening. On EKG here no ischemic changes. Initial troponin 0.00. Patient states his pain is been present for greater than 5 hours at time of draw consistently. Given consistent pain for over 5 hours reassuring EKG and nondetectable troponin unlikely represent ACS. Patient has no serious intrathoracic pathology on x-ray. Certainly no pneumothorax consolidative process widened mediastinum etc.  Exam and vitals are not consistent with pulmonary embolism. On reevaluation patient continues to do well no apparent distress with normal vital signs. Unclear etiology of chest pain at this time. However does not appear to be cardiac related or other serious intrathoracic at this time. We recommended follow-up with PCP this week. Suggested patient may benefit from a stress test as an outpatient to further characterize cardiac risk. We have discussed return precautions with the patient and family at length. Appropriate for D/C  Final diagnoses:  Chest pain, unspecified chest pain type        Robynn Pane, MD 01/11/15 1945  Lajean Saver, MD 01/12/15 [phone]

## 2015-01-15 ENCOUNTER — Encounter: Payer: Self-pay | Admitting: Medical

## 2015-01-15 ENCOUNTER — Telehealth: Payer: Self-pay | Admitting: Medical

## 2015-01-15 ENCOUNTER — Ambulatory Visit (INDEPENDENT_AMBULATORY_CARE_PROVIDER_SITE_OTHER): Payer: 59 | Admitting: Medical

## 2015-01-15 VITALS — BP 134/78 | HR 80 | Temp 98.0°F | Resp 15

## 2015-01-15 DIAGNOSIS — R42 Dizziness and giddiness: Secondary | ICD-10-CM | POA: Diagnosis not present

## 2015-01-15 DIAGNOSIS — Z8679 Personal history of other diseases of the circulatory system: Secondary | ICD-10-CM | POA: Diagnosis not present

## 2015-01-15 DIAGNOSIS — R0789 Other chest pain: Secondary | ICD-10-CM | POA: Diagnosis not present

## 2015-01-15 DIAGNOSIS — Z8249 Family history of ischemic heart disease and other diseases of the circulatory system: Secondary | ICD-10-CM

## 2015-01-15 DIAGNOSIS — E785 Hyperlipidemia, unspecified: Secondary | ICD-10-CM

## 2015-01-15 DIAGNOSIS — Z720 Tobacco use: Secondary | ICD-10-CM

## 2015-01-15 DIAGNOSIS — M25512 Pain in left shoulder: Secondary | ICD-10-CM | POA: Diagnosis not present

## 2015-01-15 DIAGNOSIS — R079 Chest pain, unspecified: Secondary | ICD-10-CM

## 2015-01-15 DIAGNOSIS — F17200 Nicotine dependence, unspecified, uncomplicated: Secondary | ICD-10-CM

## 2015-01-15 NOTE — Telephone Encounter (Signed)
Refer to Dr. Wynonia Lawman for consult of chest pain, cardiac risk factors.

## 2015-01-15 NOTE — Telephone Encounter (Signed)
Pt has an appt with Dr. Wynonia Lawman on Tuesday June 21st, 2016 @ 3:15pm but to arrive 58mins early. Pt is aware

## 2015-01-15 NOTE — Progress Notes (Signed)
Subjective: Here for ED f/u.  Was seen at urgent care 01/11/15, and subsequent taken by EMS to the ED for chest pain.  Had EKG, CXR, labs, and was not thought to have cardiac source of chest pain.  Etiology was unclear.  Since the ED visit still has intermittent chest discomfort.   Has had some episodes of dizziness since ED visit.  No SOB, no paresthesias.  She does smoke, "always has stress".  No worse or new symptoms since the ED visit.    Patient's cardiac risk factors are: dyslipidemia, hypertension and smoking/ tobacco exposure. Patient's risk factors for DVT/PE: IUD/hormonal contraceptive and hx/o +anticardiolipin antibody. Previous cardiac testing: electrocardiogram (ECG).  ROS as in subjective  Objective: BP 134/78 mmHg  Pulse 80  Temp(Src) 98 F (36.7 C) (Oral)  Resp 15  General appearance: alert, no distress, WD/WN Neck: supple, no lymphadenopathy, no thyromegaly, no masses,no bruits Heart: RRR, normal S1, S2, no murmurs Lungs: CTA bilaterally, no wheezes, rhonchi, or rales MSK: arms nontender, normal ROM Chest wall: mild tennders left upper lateral chest wall Abdomen: +bs, soft, non tender, non distended, no masses, no hepatomegaly, no splenomegaly Extremities: no edema, no cyanosis, no clubbing Pulses: 2+ symmetric, upper and lower extremities, normal cap refill Neurological: alert, oriented x 3, nonfocal exam Psychiatric: normal affect, behavior normal, pleasant       Assessment: Encounter Diagnoses  Name Primary?  . Chest pain, unspecified chest pain type Yes  . Left shoulder pain   . Smoker   . Hyperlipidemia   . Family history of heart disease   . Dizziness and giddiness   . History of hypertension     Plan: reviewed recent ED report, EKG, CXR, labs.  She does have left upper chest wall tenderness today.  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  She also had unexplained symptoms of dizziness.  Given cardiac risk factors, will send to cardiology for consult.  Advised she consider counseling to help deal with stress, advised the need to stop tobacco, c/t current medication, can use Ibuprofen the next few days to help chest wall pain.  Call/return sooner if any other problems.

## 2015-02-10 ENCOUNTER — Encounter: Payer: Self-pay | Admitting: Cardiology

## 2015-02-10 NOTE — Progress Notes (Signed)
Patient ID: KAMESHIA MADRUGA, female   DOB: 06/10/1966, 49 y.o.   MRN: [id number]    Aanshi, Batchelder    Date of visit:  02/10/2015 DOB:  03-31-1966    Age:  49 yrs. Medical record number:  [id number]     Account number:  [id number] Primary Care Provider: Robyne Askew ____________________________ CURRENT DIAGNOSES  1. Chest pain  2. Hyperlipidemia  3. Overweight  4. Family history of ischemic heart disease and other diseases of the circulatory system ____________________________ ALLERGIES  Avelox, Rash  Imitrex, Intolerance-unknown  Morphine, Vomiting  Sulfa (Sulfonamide Antibiotics), Rash ____________________________ MEDICATIONS  1. atorvastatin 20 mg tablet, 1 p.o. daily  2. terbinafine HCl 250 mg tablet, 1 p.o. daily  3. multivitamin tablet, 1 p.o. daily ____________________________ CHIEF COMPLAINTS  Beaver  Lshoulder and chest pain ____________________________ HISTORY OF PRESENT ILLNESS This very nice 49 year old female is seen at the request of Dorothea Ogle for evaluation of shoulder and chest discomfort. The patient has previously been in good health except as smoked for several years. She has a history of a positive anticardiolipin antibody as well as hyperlipidemia. She evidently was treated with blood pressure medicines a number of years ago but is no longer on them. She has been overweight for several years. She does have an early family history of cardiac disease in her mother. She developed some prolonged left shoulder pain that radiated to her anterior chest that persisted while she was at work and was l cemented urgent care where she was found to have an abnormal EKG and was sent to the emergency room. A chest x-ray was unremarkable and she was later sent out to be evaluated later by cardiology. She has had some continued intermittent chest discomfort since then but she states that she has had for years she will describe the pain as a left upper chest area not related to  exertion and states that exertion sometimes will cause the discomfort to get better. She is unable to get much in the way of regular exercise. She does have situational stress in her household with one of her children and also being qa single mom. She denies PND, orthopnea or significant edema. ____________________________ PAST HISTORY  Past Medical Illnesses:  hyperlipidemia, obesity, anticardiolipin antibody;  Cardiovascular Illnesses:  no previous history of cardiac disease.;  Surgical Procedures:  cesarean section, kidney stone;  NYHA Classification:  I;  Canadian Angina Classification:  Class 0: Asymptomatic;  Cardiology Procedures-Invasive:  no history of prior cardiac procedures;  Cardiology Procedures-Noninvasive:  no previous non-invasive procedures;  LVEF not documented,   ____________________________ CARDIO-PULMONARY TEST DATES EKG Date:  02/10/2015;   ____________________________ FAMILY HISTORY Brother -- Brother alive and well Father -- Father alive with problem, Hypertension, Diabetes mellitus, Bowel cancer, Stroke Mother -- Mother dead, Heart Attack Sister -- Sister alive with problem, Hypertension, Diabetes mellitus, Depression Sister -- Sister alive with problem, Hypertension ____________________________ SOCIAL HISTORY Alcohol Use:  no alcohol use;  Smoking:  smokes less than 1 ppd, 10 pack year history;  Diet:  regular diet;  Lifestyle:  divorced;  Exercise:  no regular exercise;  Occupation:  [employer];  Residence:  lives with children;   ____________________________ REVIEW OF SYSTEMS General:  obesity  Integumentary:no rashes or new skin lesions. Eyes: wears eye glasses/contact lenses Ears, Nose, Throat, Mouth:  denies any hearing loss, epistaxis, hoarseness or difficulty speaking. Respiratory: cough Cardiovascular:  please review HPI Abdominal: diarrhea and blood in stools-bright redGenitourinary-Female: no dysuria, urgency, frequency, UTIs,  or stress incontinence  Musculoskeletal:  denies arthritis, venous insufficiency, or muscle weakness Neurological:  occasional headaches Psychiatric:  situational stress Hematological/Immunologic:  denies any food allergies, bleeding disorders. ____________________________ PHYSICAL EXAMINATION VITAL SIGNS  Blood Pressure:  104/70 Sitting, Left arm, regular cuff  , 100/74 Standing, Left arm and regular cuff   Pulse:  80/min. Weight:  171.00 lbs. Height:  64"BMI: 29  Constitutional:  pleasant white female, in no acute distress, mildly obese Skin:  warm and dry to touch, no apparent skin lesions, or masses noted. Head:  normocephalic, normal hair pattern, no masses or tenderness Eyes:  EOMS Intact, PERRLA, C and S clear, Funduscopic exam not done. ENT:  ears, nose and throat reveal no gross abnormalities.  Dentition good. Neck:  supple, without massess. No JVD, thyromegaly or carotid bruits. Carotid upstroke normal. Chest:  normal symmetry, clear to auscultation. Cardiac:  regular rhythm, normal S1 and S2, No S3 or S4, no murmurs, gallops or rubs detected. Abdomen:  abdomen soft,non-tender, no masses, no hepatospenomegaly, or aneurysm noted Peripheral Pulses:  the femoral,dorsalis pedis, and posterior tibial pulses are full and equal bilaterally with no bruits auscultated. Extremities & Back:  no deformities, clubbing, cyanosis, erythema or edema observed. Normal muscle strength and tone. Neurological:  no gross motor or sensory deficits noted, affect appropriate, oriented x3. ____________________________ MOST RECENT LIPID PANEL 10/10/14  CHOL TOTL 193 mg/dl, LDL 114 NM, HDL 53 mg/dl, TRIGLYCER 130 mg/dl and CHOL/HDL 3.6 (Calc) ____________________________ IMPRESSIONS/PLAN 1. Left shoulder and upper chest discomfort in a patient with multiple risk factors 2. Hyperlipidemia under treatment 3. Tobacco abuse advised to stop 4. Family history of cardiac disease 5. History of positive anticardiolipin  antibody  Recommendations:  Emergency room records and lab reviewed. She has an abnormal EKG with ST depression in the lateral leads. I would recommend that she have a myocardial perfusion scan to assess the recent chest discomfort in light of her abnormal EKG and multiple risk factors. We discussed the importance of stopping smoking. Thank you for asking me to see her with you. ____________________________ TODAYS ORDERS  1. Treadmill 1 day Cardiolite: First Available  2. 12 Lead EKG: Today                       ____________________________ Cardiology Physician:  Kerry Hough MD [facility]

## 2015-02-19 ENCOUNTER — Encounter: Payer: Self-pay | Admitting: Family Medicine

## 2015-04-07 ENCOUNTER — Telehealth: Payer: Self-pay | Admitting: *Deleted

## 2015-04-07 ENCOUNTER — Ambulatory Visit (INDEPENDENT_AMBULATORY_CARE_PROVIDER_SITE_OTHER): Payer: 59 | Admitting: Gynecology

## 2015-04-07 ENCOUNTER — Encounter: Payer: Self-pay | Admitting: Gynecology

## 2015-04-07 VITALS — BP 120/82 | Ht 63.75 in | Wt 169.0 lb

## 2015-04-07 DIAGNOSIS — R635 Abnormal weight gain: Secondary | ICD-10-CM | POA: Diagnosis not present

## 2015-04-07 DIAGNOSIS — Z01419 Encounter for gynecological examination (general) (routine) without abnormal findings: Secondary | ICD-10-CM

## 2015-04-07 DIAGNOSIS — K644 Residual hemorrhoidal skin tags: Secondary | ICD-10-CM

## 2015-04-07 LAB — THYROID PANEL WITH TSH
Free Thyroxine Index: 2.5 (ref 1.4–3.8)
T3 Uptake: 28 % (ref 22–35)
T4, Total: 9.1 ug/dL (ref 4.5–12.0)
TSH: 1.432 u[IU]/mL (ref 0.350–4.500)

## 2015-04-07 LAB — COMPREHENSIVE METABOLIC PANEL
ALT: 11 U/L (ref 6–29)
AST: 14 U/L (ref 10–35)
Albumin: 3.9 g/dL (ref 3.6–5.1)
Alkaline Phosphatase: 87 U/L (ref 33–115)
BUN: 10 mg/dL (ref 7–25)
CO2: 27 mmol/L (ref 20–31)
Calcium: 9 mg/dL (ref 8.6–10.2)
Chloride: 106 mmol/L (ref 98–110)
Creat: 0.61 mg/dL (ref 0.50–1.10)
Glucose, Bld: 74 mg/dL (ref 65–99)
Potassium: 4.5 mmol/L (ref 3.5–5.3)
Sodium: 140 mmol/L (ref 135–146)
Total Bilirubin: 0.7 mg/dL (ref 0.2–1.2)
Total Protein: 6.6 g/dL (ref 6.1–8.1)

## 2015-04-07 LAB — CBC WITH DIFFERENTIAL/PLATELET
Basophils Absolute: 0.1 10*3/uL (ref 0.0–0.1)
Basophils Relative: 1 % (ref 0–1)
Eosinophils Absolute: 0.1 10*3/uL (ref 0.0–0.7)
Eosinophils Relative: 1 % (ref 0–5)
HCT: 38.2 % (ref 36.0–46.0)
Hemoglobin: 12.8 g/dL (ref 12.0–15.0)
Lymphocytes Relative: 30 % (ref 12–46)
Lymphs Abs: 1.9 10*3/uL (ref 0.7–4.0)
MCH: 30.3 pg (ref 26.0–34.0)
MCHC: 33.5 g/dL (ref 30.0–36.0)
MCV: 90.3 fL (ref 78.0–100.0)
MPV: 9 fL (ref 8.6–12.4)
Monocytes Absolute: 0.4 10*3/uL (ref 0.1–1.0)
Monocytes Relative: 6 % (ref 3–12)
Neutro Abs: 4 10*3/uL (ref 1.7–7.7)
Neutrophils Relative %: 62 % (ref 43–77)
Platelets: 317 10*3/uL (ref 150–400)
RBC: 4.23 MIL/uL (ref 3.87–5.11)
RDW: 13.6 % (ref 11.5–15.5)
WBC: 6.4 10*3/uL (ref 4.0–10.5)

## 2015-04-07 LAB — LIPID PANEL
Cholesterol: 149 mg/dL (ref 125–200)
HDL: 46 mg/dL (ref >=46)
LDL Cholesterol: 78 mg/dL (ref <130)
Total CHOL/HDL Ratio: 3.2 Ratio (ref <=5.0)
Triglycerides: 126 mg/dL (ref <150)
VLDL: 25 mg/dL (ref <30)

## 2015-04-07 MED ORDER — PHENTERMINE HCL 37.5 MG PO CAPS: 37.5000 mg | ORAL_CAPSULE | ORAL | Status: DC

## 2015-04-07 NOTE — Patient Instructions (Signed)
Phentermine tablets or capsules What is this medicine? PHENTERMINE (FEN ter meen) decreases your appetite. It is used with a reduced calorie diet and exercise to help you lose weight. This medicine may be used for other purposes; ask your health care provider or pharmacist if you have questions. COMMON BRAND NAME(S): Adipex-P, Atti-Plex P, Atti-Plex P Spansule, Fastin, Pro-Fast, Tara-8 What should I tell my health care provider before I take this medicine? They need to know if you have any of these conditions: -agitation -glaucoma -heart disease -high blood pressure -history of substance abuse -lung disease called Primary Pulmonary Hypertension (PPH) -taken an MAOI like Carbex, Eldepryl, Marplan, Nardil, or Parnate in last 14 days -thyroid disease -an unusual or allergic reaction to phentermine, other medicines, foods, dyes, or preservatives -pregnant or trying to get pregnant -breast-feeding How should I use this medicine? Take this medicine by mouth with a glass of water. Follow the directions on the prescription label. This medicine is usually taken 30 minutes before or 1 to 2 hours after breakfast. Avoid taking this medicine in the evening. It may interfere with sleep. Take your doses at regular intervals. Do not take your medicine more often than directed. Talk to your pediatrician regarding the use of this medicine in children. Special care may be needed. Overdosage: If you think you have taken too much of this medicine contact a poison control center or emergency room at once. NOTE: This medicine is only for you. Do not share this medicine with others. What if I miss a dose? If you miss a dose, take it as soon as you can. If it is almost time for your next dose, take only that dose. Do not take double or extra doses. What may interact with this medicine? Do not take this medicine with any of the following medications: -duloxetine -MAOIs like Carbex, Eldepryl, Marplan, Nardil, and  Parnate -medicines for colds or breathing difficulties like pseudoephedrine or phenylephrine -procarbazine -sibutramine -SSRIs like citalopram, escitalopram, fluoxetine, fluvoxamine, paroxetine, and sertraline -stimulants like dexmethylphenidate, methylphenidate or modafinil -venlafaxine This medicine may also interact with the following medications: -medicines for diabetes This list may not describe all possible interactions. Give your health care provider a list of all the medicines, herbs, non-prescription drugs, or dietary supplements you use. Also tell them if you smoke, drink alcohol, or use illegal drugs. Some items may interact with your medicine. What should I watch for while using this medicine? Notify your physician immediately if you become short of breath while doing your normal activities. Do not take this medicine within 6 hours of bedtime. It can keep you from getting to sleep. Avoid drinks that contain caffeine and try to stick to a regular bedtime every night. This medicine was intended to be used in addition to a healthy diet and exercise. The best results are achieved this way. This medicine is only indicated for short-term use. Eventually your weight loss may level out. At that point, the drug will only help you maintain your new weight. Do not increase or in any way change your dose without consulting your doctor. You may get drowsy or dizzy. Do not drive, use machinery, or do anything that needs mental alertness until you know how this medicine affects you. Do not stand or sit up quickly, especially if you are an older patient. This reduces the risk of dizzy or fainting spells. Alcohol may increase dizziness and drowsiness. Avoid alcoholic drinks. What side effects may I notice from receiving this medicine? Side effects that  you should report to your doctor or health care professional as soon as possible: -chest pain, palpitations -depression or severe changes in  mood -increased blood pressure -irritability -nervousness or restlessness -severe dizziness -shortness of breath -problems urinating -unusual swelling of the legs -vomiting Side effects that usually do not require medical attention (report to your doctor or health care professional if they continue or are bothersome): -blurred vision or other eye problems -changes in sexual ability or desire -constipation or diarrhea -difficulty sleeping -dry mouth or unpleasant taste -headache -nausea This list may not describe all possible side effects. Call your doctor for medical advice about side effects. You may report side effects to FDA at 1-800-FDA-1088. Where should I keep my medicine? Keep out of the reach of children. This medicine can be abused. Keep your medicine in a safe place to protect it from theft. Do not share this medicine with anyone. Selling or giving away this medicine is dangerous and against the law. Store at room temperature between 20 and 25 degrees C (68 and 77 degrees F). Keep container tightly closed. Throw away any unused medicine after the expiration date. NOTE: This sheet is a summary. It may not cover all possible information. If you have questions about this medicine, talk to your doctor, pharmacist, or health care provider.  2015, [employer]vier/[employer] Standard. (2010-09-22 11:02:44)

## 2015-04-07 NOTE — Progress Notes (Signed)
[place] 03-Aug-1966 [id number]   History:    49 y.o.  for annual gyn exam with the only complaint being of weight gain. Also with hemorrhoidal tag that continues to bother her. Last year she took phentermine for 3 months and it helped her lose weight. She was weighing 174 last year and is 169 now. Patient had the Mirena IUD placed for contraception and is due to be removed in 2019. Her father has had history of colon cancer patient had a colonoscopy 6 years ago. Patient is also overdue for her mammogram is been 2 years. Her PCP had been treating her for hypertension and hypercholesterolemia which she is only taking now Lipitor and has not had blood work in over year would like to have her fasting blood work done here today. Patient with no past history of any abnormal Pap smears.  Past medical history,surgical history, family history and social history were all reviewed and documented in the EPIC chart.  Gynecologic History No LMP recorded. Patient is not currently having periods (Reason: IUD). Contraception: IUD Last Pap: 2014. Results were: normal Last mammogram: 2014. Results were: normal  Obstetric History OB History  Gravida Para Term Preterm AB SAB TAB Ectopic Multiple Living  4 3 3  1     3     # Outcome Date GA Lbr Len/2nd Weight Sex Delivery Anes PTL Lv  4 AB           3 Term     F CS-Unspec  N Y  2 Term     F CS-Unspec  N Y  1 Term     M CS-Unspec  N Y       ROS: A ROS was performed and pertinent positives and negatives are included in the history.  GENERAL: No fevers or chills. HEENT: No change in vision, no earache, sore throat or sinus congestion. NECK: No pain or stiffness. CARDIOVASCULAR: No chest pain or pressure. No palpitations. PULMONARY: No shortness of breath, cough or wheeze. GASTROINTESTINAL: No abdominal pain, nausea, vomiting or diarrhea, melena or bright red blood per rectum. GENITOURINARY: No urinary frequency, urgency, hesitancy or dysuria.  MUSCULOSKELETAL: No joint or muscle pain, no back pain, no recent trauma. DERMATOLOGIC: No rash, no itching, no lesions. ENDOCRINE: No polyuria, polydipsia, no heat or cold intolerance. No recent change in weight. HEMATOLOGICAL: No anemia or easy bruising or bleeding. NEUROLOGIC: No headache, seizures, numbness, tingling or weakness. PSYCHIATRIC: No depression, no loss of interest in normal activity or change in sleep pattern.     Exam: chaperone present  BP 120/82 mmHg  Ht 5' 3.75" (1.619 m)  Wt 169 lb (76.658 kg)  BMI 29.25 kg/m2  Body mass index is 29.25 kg/(m^2).  General appearance : Well developed well nourished female. No acute distress HEENT: Eyes: no retinal hemorrhage or exudates,  Neck supple, trachea midline, no carotid bruits, no thyroidmegaly Lungs: Clear to auscultation, no rhonchi or wheezes, or rib retractions  Heart: Regular rate and rhythm, no murmurs or gallops Breast:Examined in sitting and supine position were symmetrical in appearance, no palpable masses or tenderness,  no skin retraction, no nipple inversion, no nipple discharge, no skin discoloration, no axillary or supraclavicular lymphadenopathy Abdomen: no palpable masses or tenderness, no rebound or guarding Extremities: no edema or skin discoloration or tenderness  Pelvic:  Bartholin, Urethra, Skene Glands: Within normal limits             Vagina: No gross lesions or discharge  Cervix:  No gross lesions or discharge IUD string seen  Uterus  anteverted, normal size, shape and consistency, non-tender and mobile  Adnexa  Without masses or tenderness  Anus and perineum  normal   Rectovaginal  normal sphincter tone perirectal hemorrhoidal tag             Hemoccult cards will be provided     Assessment/Plan:  49 y.o. female for annual exam was reminded to follow-up with her gastroenterologist as his been 6 years since her last colonoscopy. She does have history of colon cancer in her father. Pap smear not done  today in accordance to the new guideline. The following screening blood work was ordered today: Fasting lipid profile, comprehensive metabolic panel, TSH, CBC, and urinalysis. She was also provided with requisition to schedule her overdue mammogram. We discussed importance of monthly breast exam. Patient will be started on phentermine 37.5 mg 1 by mouth daily for 3 months. She'll return back to the office in 6 weeks for a blood pressure check in weight measurement. The risks benefits and pros and cons of phentermine were discussed to include pulmonary hypertension and cardiac valvular disease. Patient's fully aware took it last year with no side effects and fully accepts the risk. She is also going to be referred to the colorectal specialist to excise the hemorrhoidal tags which are bothering her.   Terrance Mass MD, 10:45 AM 04/07/2015

## 2015-04-07 NOTE — Telephone Encounter (Signed)
Phentermine 37.5 mg capsule was set on print, Rx called into pharmacy.

## 2015-04-08 LAB — URINALYSIS W MICROSCOPIC + REFLEX CULTURE
Bacteria, UA: NONE SEEN [HPF]
Bilirubin Urine: NEGATIVE
Casts: NONE SEEN [LPF]
Crystals: NONE SEEN [HPF]
Glucose, UA: NEGATIVE
Hgb urine dipstick: NEGATIVE
Ketones, ur: NEGATIVE
Leukocytes, UA: NEGATIVE
Nitrite: NEGATIVE
Protein, ur: NEGATIVE
RBC / HPF: NONE SEEN RBC/HPF (ref <=2)
Specific Gravity, Urine: 1.018 (ref 1.001–1.035)
WBC, UA: NONE SEEN WBC/HPF (ref <=5)
Yeast: NONE SEEN [HPF]
pH: 5.5 (ref 5.0–8.0)

## 2015-05-01 ENCOUNTER — Encounter: Payer: Self-pay | Admitting: Family Medicine

## 2015-05-01 ENCOUNTER — Ambulatory Visit (INDEPENDENT_AMBULATORY_CARE_PROVIDER_SITE_OTHER): Payer: 59 | Admitting: Family Medicine

## 2015-05-01 VITALS — BP 118/72 | HR 68 | Temp 99.1°F

## 2015-05-01 DIAGNOSIS — J019 Acute sinusitis, unspecified: Secondary | ICD-10-CM

## 2015-05-01 MED ORDER — AMOXICILLIN 875 MG PO TABS: 875.0000 mg | ORAL_TABLET | Freq: Two times a day (BID) | ORAL | Status: DC

## 2015-05-01 NOTE — Progress Notes (Signed)
   Subjective:    Patient ID: Kelly [name], female    DOB: August 21, 1966, 49 y.o.   MRN: [id number]  HPI Patient here for a upper respiratory infection that started with sore throat, congestion and sinus pressure about one week ago and right ear pain off and on. Also reports a productive cough. Denies fever, chills, nausea, vomiting. Has sick contacts at home. No recent antibiotic use.   Review of Systems Pertinent positives and negatives in the history of present illness.    Objective:   Physical Exam  Alert and in no distress. Tenderness to maxillary sinuses. Right TM has mild fluid and left TM normal. Bilateral canals are normal. Pharyngeal area is slightly erythematous. Neck is supple without adenopathy. Lungs are clear to auscultation.        Assessment & Plan:  Acute rhinosinusitis  Discussed that it is really too soon to tell if she has a viral or bacterial etiology. Encouraged her to hydrate and rest. Provided her with a saline nasal spray sample with instructions. Recommend she pick up Flonase or Rhinocort and use that as well. I will prescribe an antibiotic but encouraged her to give it a couple more days and see if she is feeling better without it.

## 2015-05-01 NOTE — Patient Instructions (Signed)
Use the saline nasal spray twice daily. Also pick up either Rhinocort or Flonase and use twice daily but not at the same time as the saline spray. Make sure you are hydrating and rest as much as possible. I will call in an antibiotic however it is too soon to tell if this is viral or bacterial. Give it a couple of more days and if you're not feeling better start the antibiotic. If you spike a high fever or get much worse between now and then go ahead and start the antibiotic.  Sinusitis Sinusitis is redness, soreness, and inflammation of the paranasal sinuses. Paranasal sinuses are air pockets within the bones of your face (beneath the eyes, the middle of the forehead, or above the eyes). In healthy paranasal sinuses, mucus is able to drain out, and air is able to circulate through them by way of your nose. However, when your paranasal sinuses are inflamed, mucus and air can become trapped. This can allow bacteria and other germs to grow and cause infection. Sinusitis can develop quickly and last only a short time (acute) or continue over a long period (chronic). Sinusitis that lasts for more than 12 weeks is considered chronic.  CAUSES  Causes of sinusitis include:  Allergies.  Structural abnormalities, such as displacement of the cartilage that separates your nostrils (deviated septum), which can decrease the air flow through your nose and sinuses and affect sinus drainage.  Functional abnormalities, such as when the small hairs (cilia) that line your sinuses and help remove mucus do not work properly or are not present. SIGNS AND SYMPTOMS  Symptoms of acute and chronic sinusitis are the same. The primary symptoms are pain and pressure around the affected sinuses. Other symptoms include:  Upper toothache.  Earache.  Headache.  Bad breath.  Decreased sense of smell and taste.  A cough, which worsens when you are lying flat.  Fatigue.  Fever.  Thick drainage from your nose, which  often is green and may contain pus (purulent).  Swelling and warmth over the affected sinuses. DIAGNOSIS  Your health care provider will perform a physical exam. During the exam, your health care provider may:  Look in your nose for signs of abnormal growths in your nostrils (nasal polyps).  Tap over the affected sinus to check for signs of infection.  View the inside of your sinuses (endoscopy) using an imaging device that has a light attached (endoscope). If your health care provider suspects that you have chronic sinusitis, one or more of the following tests may be recommended:  Allergy tests.  Nasal culture. A sample of mucus is taken from your nose, sent to a lab, and screened for bacteria.  Nasal cytology. A sample of mucus is taken from your nose and examined by your health care provider to determine if your sinusitis is related to an allergy. TREATMENT  Most cases of acute sinusitis are related to a viral infection and will resolve on their own within 10 days. Sometimes medicines are prescribed to help relieve symptoms (pain medicine, decongestants, nasal steroid sprays, or saline sprays).  However, for sinusitis related to a bacterial infection, your health care provider will prescribe antibiotic medicines. These are medicines that will help kill the bacteria causing the infection.  Rarely, sinusitis is caused by a fungal infection. In theses cases, your health care provider will prescribe antifungal medicine. For some cases of chronic sinusitis, surgery is needed. Generally, these are cases in which sinusitis recurs more than 3 times per  year, despite other treatments. HOME CARE INSTRUCTIONS   Drink plenty of water. Water helps thin the mucus so your sinuses can drain more easily.  Use a humidifier.  Inhale steam 3 to 4 times a day (for example, sit in the bathroom with the shower running).  Apply a warm, moist washcloth to your face 3 to 4 times a day, or as directed by your  health care provider.  Use saline nasal sprays to help moisten and clean your sinuses.  Take medicines only as directed by your health care provider.  If you were prescribed either an antibiotic or antifungal medicine, finish it all even if you start to feel better. SEEK IMMEDIATE MEDICAL CARE IF:  You have increasing pain or severe headaches.  You have nausea, vomiting, or drowsiness.  You have swelling around your face.  You have vision problems.  You have a stiff neck.  You have difficulty breathing. MAKE SURE YOU:   Understand these instructions.  Will watch your condition.  Will get help right away if you are not doing well or get worse. Document Released: 08/08/2005 Document Revised: 12/23/2013 Document Reviewed: 08/23/2011 [facility] Patient Information 2015 [place], [place]. This information is not intended to replace advice given to you by your health care provider. Make sure you discuss any questions you have with your health care provider.

## 2015-05-28 ENCOUNTER — Encounter: Payer: Self-pay | Admitting: Medical

## 2015-05-28 ENCOUNTER — Ambulatory Visit (INDEPENDENT_AMBULATORY_CARE_PROVIDER_SITE_OTHER): Payer: 59 | Admitting: Medical

## 2015-05-28 ENCOUNTER — Telehealth: Payer: Self-pay | Admitting: Medical

## 2015-05-28 VITALS — BP 106/60 | HR 72 | Temp 98.4°F | Wt 168.0 lb

## 2015-05-28 DIAGNOSIS — Z720 Tobacco use: Secondary | ICD-10-CM

## 2015-05-28 DIAGNOSIS — Z7189 Other specified counseling: Secondary | ICD-10-CM

## 2015-05-28 DIAGNOSIS — F17200 Nicotine dependence, unspecified, uncomplicated: Secondary | ICD-10-CM

## 2015-05-28 DIAGNOSIS — H68001 Unspecified Eustachian salpingitis, right ear: Secondary | ICD-10-CM

## 2015-05-28 DIAGNOSIS — J309 Allergic rhinitis, unspecified: Secondary | ICD-10-CM | POA: Diagnosis not present

## 2015-05-28 DIAGNOSIS — H9201 Otalgia, right ear: Secondary | ICD-10-CM

## 2015-05-28 DIAGNOSIS — Z7185 Encounter for immunization safety counseling: Secondary | ICD-10-CM

## 2015-05-28 DIAGNOSIS — J029 Acute pharyngitis, unspecified: Secondary | ICD-10-CM

## 2015-05-28 MED ORDER — AZELASTINE-FLUTICASONE 137-50 MCG/ACT NA SUSP: 1.0000 | Freq: Two times a day (BID) | NASAL | Status: DC

## 2015-05-28 MED ORDER — CLARITHROMYCIN 500 MG PO TABS: 500.0000 mg | ORAL_TABLET | Freq: Two times a day (BID) | ORAL | Status: DC

## 2015-05-28 NOTE — Progress Notes (Addendum)
Subjective: Chief Complaint  Patient presents with  . sore throat    rt ear hurts. taken tylenol & motrin, and dayquil for sore throat. Helped some. started about 2 days ago and the ear ache started last night. no other concerns.   Awoke with several sharp pains in ear last night, has had sore throat for several days.  Right face feels hot.   Some lingering cough x 1 month.   continues to smoke.   Some sneezing, but that is routine.  Has some mild drippy nose, allergies, but this is ongoing.  Has sinus infection a month ago, was on Amoxicillin for this.  Taking some Dayquil and Motrin.  On generic zyrtec for allergies in general.  No other aggravating or relieving factors. No other complaint.  Also interested in pneumococcal vaccine.  Past Medical History  Diagnosis Date  . Hyperlipidemia   . Anticardiolipin antibody positive   . Hypertension     diet controlled  . Renal calculus, right   . HSV-2 (herpes simplex virus 2) infection    ROS as in subjective  Objective: BP 106/60 mmHg  Pulse 72  Temp(Src) 98.4 F (36.9 C)  Wt 168 lb (76.204 kg)  General appearance: alert, no distress, WD/WN HEENT: normocephalic, sclerae anicteric, tender right maxillary sinus and cheek, TMs with slight pink/red coloration, R>L, serous fluid behind right TM, nares patent, no discharge, +erythema, pharynx with minimal erythema Oral cavity: MMM, no lesions Neck: supple, no lymphadenopathy, no thyromegaly, no masses Lungs: CTA bilaterally, no wheezes, rhonchi, or rales    Assessment: Encounter Diagnoses  Name Primary?  . Otalgia of right ear Yes  . Sore throat   . Allergic rhinitis, unspecified allergic rhinitis type   . Eustachian salpingitis, right   . Smoker     Plan: Currently symptoms suggest eustachian dysfunction, and underlying allergies.  Will c/t zyrtec, but add Dymista and sudafed short term, neti pot, good hydration.  If worse or not improving in next 3-5 days, can begin Biaxin.    Recommended she stop tobacco  Medications prescribed today: Dymista nasal spray samples, Biaxin for watch and wait approach.   Specific home care recommendations today include:  Only take over-the-counter (OTC) or prescription medicines for pain, discomfort, or fever as directed by your caregiver.    Decongestant: You may use OTC Guaifenesin (Mucinex plain) for congestion.  You may use Pseudoephedrine (Sudafed) only if you don't have blood pressure problems or a diagnosis of hypertension.  Cough suppression: If you have cough from drainage, you may use over-the-counter Dextromethorphan (Delsym) as directed on the label  Sore throat remedies:  You may use salt water gargles, warm fluids such as coffee or hot tea, or honey/tea/lemon mixture to sooth sore throat pain.  You may use OTC sore throat remedies such as Cepacol lozenges or Chloraseptic spray for sore throat pain.  Runny nose and sneezing remedies: You may use OTC antihistamine such as Zyrtec or Benadryl, but caution as these can cause drowsiness.    Pain/fever relief: You may use over-the-counter Tylenol for pain or fever  Drink extra fluids. Fluids help thin the mucus so your sinuses can drain more easily.   Applying either moist heat or ice packs to the sinus areas may help relieve discomfort.  Use saline nasal sprays to help moisten your sinuses. The sprays can be found at your local drugstore.   Patient was advised to call or return if worse or not improving in the next few days.  She will return at her convenience for pneumococcal 23 vaccine.   Patient voiced understanding of diagnosis, recommendations, and treatment plan.

## 2015-05-28 NOTE — Telephone Encounter (Signed)
She can return for Pneumococcal 23 at her convenience.  Melissa checked and insurance will cover.

## 2015-06-16 ENCOUNTER — Ambulatory Visit (INDEPENDENT_AMBULATORY_CARE_PROVIDER_SITE_OTHER): Payer: 59 | Admitting: Medical

## 2015-06-16 ENCOUNTER — Encounter: Payer: Self-pay | Admitting: Medical

## 2015-06-16 VITALS — BP 128/82 | HR 88 | Temp 98.7°F | Wt 171.0 lb

## 2015-06-16 DIAGNOSIS — J209 Acute bronchitis, unspecified: Secondary | ICD-10-CM | POA: Diagnosis not present

## 2015-06-16 DIAGNOSIS — Z720 Tobacco use: Secondary | ICD-10-CM

## 2015-06-16 DIAGNOSIS — F17200 Nicotine dependence, unspecified, uncomplicated: Secondary | ICD-10-CM

## 2015-06-16 DIAGNOSIS — R062 Wheezing: Secondary | ICD-10-CM | POA: Diagnosis not present

## 2015-06-16 MED ORDER — ALBUTEROL SULFATE 108 (90 BASE) MCG/ACT IN AEPB: 1.0000 | INHALATION_SPRAY | Freq: Four times a day (QID) | RESPIRATORY_TRACT | Status: DC | PRN

## 2015-06-16 MED ORDER — BENZONATATE 200 MG PO CAPS: 200.0000 mg | ORAL_CAPSULE | Freq: Three times a day (TID) | ORAL | Status: DC | PRN

## 2015-06-16 MED ORDER — CLARITHROMYCIN 500 MG PO TABS: 500.0000 mg | ORAL_TABLET | Freq: Two times a day (BID) | ORAL | Status: DC

## 2015-06-16 NOTE — Progress Notes (Signed)
Subjective:  Kelly [name] is a 49 y.o. female who presents for illness.  I saw her 05/28/15 for URI which she says never fully resolved.  She ended up not taking the antibiotic then that was part of our watch and wait approach.  Got some better, but symptoms came back with a vengeance 4 days ago with cough, wheezing, settling in chest, coughing up yellow green sputum.  Has had some sore throat and ear pressure.  Denies fever, NVD. Patient is a smoker. No other aggravating or relieving factors.  No other c/o.  The following portions of the patient's history were reviewed and updated as appropriate: allergies, current medications, past family history, past medical history, past social history, past surgical history and problem list.  ROS as in subjective  Past Medical History  Diagnosis Date  . Hyperlipidemia   . Anticardiolipin antibody positive   . Hypertension     diet controlled  . Renal calculus, right   . HSV-2 (herpes simplex virus 2) infection      Objective: BP 128/82 mmHg  Pulse 88  Temp(Src) 98.7 F (37.1 C) (Oral)  Wt 171 lb (77.565 kg)  SpO2 95%  General appearance: Alert, WD/WN, no distress, ill appearing                             Skin: warm, no rash, no diaphoresis                           Head: no sinus tenderness                            Eyes: conjunctiva normal, corneas clear, PERRLA                            Ears: pearly TMs, external ear canals normal                          Nose: septum midline, turbinates swollen, with erythema and clear discharge             Mouth/throat: MMM, tongue normal, mild pharyngeal erythema                           Neck: supple, no adenopathy, no thyromegaly, nontender                          Heart: RRR, normal S1, S2, no murmurs                         Lungs: +bronchial breath sounds, +scattered rhonchi, no wheezes, no rales                Extremities: no edema, nontender     Assessment: Encounter Diagnoses  Name  Primary?  . Acute bronchitis, unspecified organism Yes  . Smoker   . Wheezing      Plan:  Discussed diagnosis and treatment of bronchitis.  Suggested symptomatic OTC remedies for cough and congestion.  Tylenol or Ibuprofen OTC for fever and malaise.  Call/return in 2-3 days if symptoms are worse or not improving.  Advised that cough may linger even after the infection is improved.     Meds  ordered this encounter  Medications  . Albuterol Sulfate (PROAIR RESPICLICK) 612 (90 BASE) MCG/ACT AEPB    Sig: Inhale 1 Inhaler into the lungs every 6 (six) hours as needed.    Dispense:  1 each    Refill:  0    Order Specific Question:  Supervising Provider    Answer:  Denita Lung [[id number]]  . clarithromycin (BIAXIN) 500 MG tablet    Sig: Take 1 tablet (500 mg total) by mouth 2 (two) times daily.    Dispense:  20 tablet    Refill:  0    Order Specific Question:  Supervising Provider    Answer:  Denita Lung [[id number]]  . benzonatate (TESSALON) 200 MG capsule    Sig: Take 1 capsule (200 mg total) by mouth 3 (three) times daily as needed for cough.    Dispense:  30 capsule    Refill:  0    Order Specific Question:  Supervising Provider    Answer:  Denita Lung [phone]

## 2015-07-29 ENCOUNTER — Ambulatory Visit (INDEPENDENT_AMBULATORY_CARE_PROVIDER_SITE_OTHER): Payer: 59 | Admitting: Family Medicine

## 2015-07-29 ENCOUNTER — Encounter: Payer: Self-pay | Admitting: Family Medicine

## 2015-07-29 VITALS — BP 130/82 | HR 64 | Temp 98.3°F

## 2015-07-29 DIAGNOSIS — J029 Acute pharyngitis, unspecified: Secondary | ICD-10-CM

## 2015-07-29 LAB — POCT RAPID STREP A (OFFICE): Rapid Strep A Screen: NEGATIVE

## 2015-07-29 NOTE — Progress Notes (Signed)
   Subjective:    Patient ID: Kelly [name], female    DOB: [birth year], 49 y.o.   MRN: [id number]  HPI She is here with complaints of cough for approximately 1 week, bilateral ear "fullness" left greater than right. Reports abrupt onset of sore throat last night and states she "could hardly swallow" and some mild body aches. Daughter has strep throat.  Took Biaxin for URI approximately 2 months ago. Has underlying allergies and is taking allergy medication Dymista and Zyrtec. She smokes cigarettes, half pack or less.   Denies known fever, nasal drainage, GI symptoms.   Allergies, medications, past medical history  Review of Systems Pertinent positives and negatives in the history of present illness.    Objective:   Physical Exam BP 130/82 mmHg  Pulse 64  Temp(Src) 98.3 F (36.8 C) (Oral)  Alert and in no distress. No sinus tenderness. Left tympanic membrane with mild fluid, right TM normal and canals are normal. Pharyngeal area is erythematous without edema or exudate. Neck is supple with mild left anterior cervical adenopathy. Cardiac exam shows a regular sinus rhythm without murmurs or gallops. Lungs are clear to auscultation.  Rapid strep negative     Assessment & Plan:  Acute pharyngitis, unspecified pharyngitis type - Plan: POCT rapid strep A  Questioned if patient has had physical exam in past year or so. She states she has physicals at her OB/GYN Dr. Toney Rakes office and is up to date with this. Discussed that her strep test is negative and recommend symptomatic treatment including ibuprofen or tylenol for fever, body aches, sore throat. Continue treating underlying allergies and let me know if no improvement in the next 3-4 days. She can do saltwater gargles and throat lozenges as well as staying hydrated.

## 2015-08-19 ENCOUNTER — Telehealth: Payer: Self-pay | Admitting: *Deleted

## 2015-08-19 ENCOUNTER — Telehealth: Payer: Self-pay

## 2015-08-19 MED ORDER — LIDOCAINE (ANORECTAL) 5 % EX CREA: TOPICAL_CREAM | CUTANEOUS | Status: DC

## 2015-08-19 MED ORDER — OSELTAMIVIR PHOSPHATE 75 MG PO CAPS: 75.0000 mg | ORAL_CAPSULE | Freq: Every day | ORAL | Status: DC

## 2015-08-19 MED ORDER — ACYCLOVIR 5 % EX OINT: 1.0000 "application " | TOPICAL_OINTMENT | CUTANEOUS | Status: DC

## 2015-08-19 NOTE — Telephone Encounter (Signed)
(  Dr.Fernandez patient) pt called requesting new 2 Rx that have expired Acyclovir 5% ointment for HSV and Lidocaine Anorectal 5% cream for hemorrhoids. Okay to send Rx? Annual due in Aug. 2017.

## 2015-08-19 NOTE — Telephone Encounter (Signed)
Okay to refill? 

## 2015-08-19 NOTE — Telephone Encounter (Signed)
Rx sent pt aware 

## 2015-08-19 NOTE — Telephone Encounter (Signed)
Sent in medication per shanes request

## 2015-09-04 MED FILL — RECTICARE 5% CREAM: 5 | 10 days supply | Qty: 30 | Fill #0

## 2015-10-28 ENCOUNTER — Other Ambulatory Visit: Payer: Self-pay | Admitting: Medical

## 2015-10-28 MED ORDER — OSELTAMIVIR PHOSPHATE 75 MG PO CAPS: 75.0000 mg | ORAL_CAPSULE | Freq: Every day | ORAL | Status: DC

## 2015-10-28 MED FILL — OSELTAMIVIR PHOS 75 MG CAP: 75 | 10 days supply | Qty: 10 | Fill #0

## 2015-11-23 ENCOUNTER — Encounter: Payer: Self-pay | Admitting: Medical

## 2015-11-23 ENCOUNTER — Ambulatory Visit (INDEPENDENT_AMBULATORY_CARE_PROVIDER_SITE_OTHER): Payer: 59 | Admitting: Medical

## 2015-11-23 VITALS — BP 118/82 | HR 85 | Temp 99.3°F | Resp 16

## 2015-11-23 DIAGNOSIS — Z720 Tobacco use: Secondary | ICD-10-CM | POA: Diagnosis not present

## 2015-11-23 DIAGNOSIS — J0100 Acute maxillary sinusitis, unspecified: Secondary | ICD-10-CM | POA: Diagnosis not present

## 2015-11-23 DIAGNOSIS — R059 Cough, unspecified: Secondary | ICD-10-CM

## 2015-11-23 DIAGNOSIS — R05 Cough: Secondary | ICD-10-CM | POA: Diagnosis not present

## 2015-11-23 DIAGNOSIS — F17200 Nicotine dependence, unspecified, uncomplicated: Secondary | ICD-10-CM

## 2015-11-23 MED ORDER — FLUCONAZOLE 150 MG PO TABS: 150.0000 mg | ORAL_TABLET | Freq: Once | ORAL | Status: DC

## 2015-11-23 MED ORDER — PROMETHAZINE-DM 6.25-15 MG/5ML PO SYRP
5.0000 mL | ORAL_SOLUTION | Freq: Four times a day (QID) | ORAL | Status: DC | PRN
Start: 2015-11-23 — End: 2016-05-04

## 2015-11-23 MED ORDER — AMOXICILLIN-POT CLAVULANATE 875-125 MG PO TABS: 1.0000 | ORAL_TABLET | Freq: Two times a day (BID) | ORAL | Status: DC

## 2015-11-23 MED FILL — AMOX TR-K CLV 875-125 MG TA: 875-125 | 10 days supply | Qty: 20 | Fill #0

## 2015-11-23 MED FILL — PROMETHAZINE-DM SYRUP: 6.25-15 | 6 days supply | Qty: 120 | Fill #0

## 2015-11-23 MED FILL — FLUCONAZOLE 150 MG TABLET: 150 | 1 days supply | Qty: 1 | Fill #0

## 2015-11-23 NOTE — Progress Notes (Signed)
Subjective:  Kelly [name] is a 50 y.o. female who presents for illness.  She notes not feeling well at all last week.  She has had cough, upper teeth pain, sinus pressure, bloody mucoid nasal drainage, cough settling into chest, feels bad in general.     No fever, no chills, no body aches, no NVD, no wheezing or SOB.  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   No other aggravating or relieving factors.  No other c/o.  Past Medical History  Diagnosis Date  . Hyperlipidemia   . Anticardiolipin antibody positive   . Hypertension     diet controlled  . Renal calculus, right   . HSV-2 (herpes simplex virus 2) infection     ROS as in subjective   Objective: BP 118/82 mmHg  Pulse 85  Temp(Src) 99.3 F (37.4 C) (Tympanic)  Resp 16  Wt   SpO2 98%  General appearance: Alert, WD/WN, no distress                             Skin: warm, no rash                           Head: +maxillary sinus tenderness                            Eyes: conjunctiva normal, corneas clear, PERRLA                            Ears: flat TMs, external ear canals normal                          Nose: septum midline, turbinates swollen, with erythema and mucoid discharge             Mouth/throat: MMM, tongue normal, mild pharyngeal erythema                           Neck: supple, no adenopathy, no thyromegaly, non tender                         Lungs: CTA bilaterally, no wheezes, rales, or rhonchi      Assessment  Encounter Diagnoses  Name Primary?  . Acute maxillary sinusitis, recurrence not specified Yes  . Smoker   . Cough        Plan: Discussed concerns, symptoms, exam findings.    Begin medications below (Augmentin, and Promethazine DM prn).  She requests Diflucan as she often can get yeast infection s/p antibiotic use.    Specific home care recommendations today include:  Only take over-the-counter (OTC) or prescription  medicines for pain, discomfort, or fever as directed by your caregiver.    Decongestant: You may use OTC Guaifenesin (Mucinex plain) for congestion.  You may use Pseudoephedrine (Sudafed) only if you don't have blood pressure problems or a diagnosis of hypertension.  Cough suppression: If you have cough from drainage, you may use over-the-counter Dextromethorphan (Delsym) as directed on the label  Pain/fever relief: You may use over-the-counter Tylenol for pain or fever  Drink extra fluids. Fluids help thin the mucus so your sinuses can drain more easily.  Applying either moist heat or ice packs to the sinus areas may help relieve discomfort.  Use saline nasal sprays to help moisten your sinuses. The sprays can be found at your local drugstore.   Katrin was seen today for cough and sinusitis.  Diagnoses and all orders for this visit:  Acute maxillary sinusitis, recurrence not specified  Smoker  Cough  Other orders -     amoxicillin-clavulanate (AUGMENTIN) 875-125 MG tablet; Take 1 tablet by mouth 2 (two) times daily. -     promethazine-dextromethorphan (PROMETHAZINE-DM) 6.25-15 MG/5ML syrup; Take 5 mLs by mouth 4 (four) times daily as needed for cough. -     fluconazole (DIFLUCAN) 150 MG tablet; Take 1 tablet (150 mg total) by mouth once.   Patient was advised to call or return if worse or not improving in the next few days.    Patient voiced understanding of diagnosis, recommendations, and treatment plan.

## 2015-12-23 ENCOUNTER — Encounter: Payer: Self-pay | Admitting: Gynecology

## 2015-12-30 ENCOUNTER — Other Ambulatory Visit: Payer: Self-pay | Admitting: Medical

## 2015-12-30 ENCOUNTER — Telehealth: Payer: Self-pay | Admitting: Family Medicine

## 2015-12-30 MED ORDER — CEFUROXIME AXETIL 500 MG PO TABS: 500.0000 mg | ORAL_TABLET | Freq: Two times a day (BID) | ORAL | Status: DC

## 2015-12-30 MED FILL — CEFUROXIME AXETIL 500 MG TA: 500 | 10 days supply | Qty: 20 | Fill #0

## 2015-12-30 NOTE — Telephone Encounter (Signed)
Called to advised pt that med sent to pharmacy

## 2015-12-30 NOTE — Telephone Encounter (Signed)
i sent ceftin.

## 2015-12-30 NOTE — Telephone Encounter (Signed)
Pt called stating that she has a sinus infection with congestion. Requesting an antibiotic

## 2016-01-01 DIAGNOSIS — Z1231 Encounter for screening mammogram for malignant neoplasm of breast: Secondary | ICD-10-CM | POA: Diagnosis not present

## 2016-01-01 DIAGNOSIS — Z803 Family history of malignant neoplasm of breast: Secondary | ICD-10-CM | POA: Diagnosis not present

## 2016-01-08 ENCOUNTER — Encounter: Payer: Self-pay | Admitting: Gynecology

## 2016-01-19 DIAGNOSIS — H524 Presbyopia: Secondary | ICD-10-CM | POA: Diagnosis not present

## 2016-01-28 ENCOUNTER — Encounter: Payer: Self-pay | Admitting: Gynecology

## 2016-02-22 ENCOUNTER — Telehealth: Payer: Self-pay | Admitting: Family Medicine

## 2016-02-22 ENCOUNTER — Other Ambulatory Visit: Payer: Self-pay | Admitting: Family Medicine

## 2016-02-22 MED ORDER — FLUCONAZOLE 150 MG PO TABS: 150.0000 mg | ORAL_TABLET | Freq: Once | ORAL | Status: DC

## 2016-02-22 MED FILL — FLUCONAZOLE 150 MG TABLET: 150 | 1 days supply | Qty: 1 | Fill #0

## 2016-02-22 NOTE — Progress Notes (Signed)
C/o vaginal itching with history of yeast infection.

## 2016-02-22 NOTE — Telephone Encounter (Signed)
Pt states has yeast infection, itching several days after beach trip.  She tried Monistat over weekend with no help.  Requested Diflucan sent to [facility]

## 2016-03-02 ENCOUNTER — Telehealth: Payer: Self-pay | Admitting: Medical

## 2016-03-02 MED ORDER — CLOBETASOL PROPIONATE 0.05 % EX CREA: 1.0000 "application " | TOPICAL_CREAM | Freq: Two times a day (BID) | CUTANEOUS | Status: DC

## 2016-03-02 MED ORDER — METHYLPREDNISOLONE 4 MG PO TBPK: ORAL_TABLET | ORAL | Status: DC

## 2016-03-02 NOTE — Telephone Encounter (Signed)
Kelly [name] called.   She is at the beach, and yesterday had quick onset of deep red rash on chest and right arm, has small red bumps within rash, rash very itchy.  Not sun burn as she hasn't been out in the sun much, has been using sun block and 1 ppiecebathing suit.  The bathing suit and sun block are the same she used a few weeks ago when at the beach. Had similar rash last year when she went to the beach.  Denies any antibiotic use, no recent med changes, not sick,no fever.  Sent me pics of this which I reviewed    Will treat for polymorphic photo eruption.  Medications sent to pharmacy (medrol dose pack and steroid cream).  She will start with topical cream, but if not improving or worse within in the next 48 hours, begin medrol dosepak.

## 2016-05-04 ENCOUNTER — Encounter: Payer: Self-pay | Admitting: Medical

## 2016-05-04 ENCOUNTER — Ambulatory Visit (INDEPENDENT_AMBULATORY_CARE_PROVIDER_SITE_OTHER): Payer: 59 | Admitting: Medical

## 2016-05-04 VITALS — BP 130/90 | HR 76 | Resp 10

## 2016-05-04 DIAGNOSIS — R0789 Other chest pain: Secondary | ICD-10-CM

## 2016-05-04 DIAGNOSIS — R110 Nausea: Secondary | ICD-10-CM | POA: Diagnosis not present

## 2016-05-04 DIAGNOSIS — R1013 Epigastric pain: Secondary | ICD-10-CM | POA: Diagnosis not present

## 2016-05-04 NOTE — Progress Notes (Signed)
Subjective; Chief Complaint  Patient presents with  . Oral Swelling    feels like something is hung in throat hurts to eat or drink pain goes from esoph. to back   Here for not feeling well.   She notes over the past few days having pains in chest, fullness in chest, belching, hiccups, pains going through to back between shoulder blades, worse with eating or drinking liquids.  Some nausea.  No vomiting, no stool changes.   She notes no heartburn, no SOB, no wheezing, no diarrhea.  Last night was real bad.   Took 2 OTC Nexium yesterday and today, but no relief.   Afraid to eat due to the symptoms. Is sleeping with head of bed elevated. No fever.  She does eat variety of foods with no particular discretion.  No other aggravating or relieving factors. No other complaint.  Past Medical History:  Diagnosis Date  . Anticardiolipin antibody positive   . HSV-2 (herpes simplex virus 2) infection   . Hyperlipidemia   . Hypertension    diet controlled  . Renal calculus, right    Past Surgical History:  Procedure Laterality Date  . CESAREAN SECTION     X3   LAST ONE 11-30-2004  . CYSTOSCOPY W/ RETROGRADES Right 10/29/2012   Procedure: CYSTOSCOPY WITH RETROGRADE PYELOGRAM and stent placement;  Surgeon: Reece Packer, MD;  Location: [place];  Service: Urology;  Laterality: Right;  rt stent placement , rt retrograde and cysto   . CYSTOSCOPY W/ URETERAL STENT PLACEMENT Right 11/23/2012   Procedure: CYSTOSCOPY WITH STENT REPLACEMENT;  Surgeon: Alexis Frock, MD;  Location: [facility];  Service: Urology;  Laterality: Right;  . CYSTOSCOPY/RETROGRADE/URETEROSCOPY/STONE EXTRACTION WITH BASKET Right 11/23/2012   Procedure: CYSTOSCOPY/RETROGRADE/URETEROSCOPY/STONE EXTRACTION WITH BASKET;  Surgeon: Alexis Frock, MD;  Location: [facility];  Service: Urology;  Laterality: Right;   Current Outpatient Prescriptions on File Prior to Visit  Medication Sig Dispense  Refill  . atorvastatin (LIPITOR) 20 MG tablet Take 1 tablet (20 mg total) by mouth at bedtime. 90 tablet 1  . Azelastine-Fluticasone (DYMISTA) 137-50 MCG/ACT SUSP Place 1 spray into the nose 2 (two) times daily. 1 Bottle 0  . ibuprofen (ADVIL,MOTRIN) 200 MG tablet Take 200 mg by mouth as needed for pain. Reported on 11/23/2015    . levonorgestrel (MIRENA) 20 MCG/24HR IUD 1 each by Intrauterine route once.    . Multiple Vitamin (MULTIVITAMIN WITH MINERALS) TABS Take 1 tablet by mouth daily.     No current facility-administered medications on file prior to visit.    ROS as in subjective   Objective: BP 130/90   Pulse 76   Resp 10   SpO2 96%   Wt Readings from Last 3 Encounters:  06/16/15 171 lb (77.6 kg)  05/28/15 168 lb (76.2 kg)  04/07/15 169 lb (76.7 kg)   General appearance: alert, no distress, WD/WN HEENT: normocephalic, sclerae anicteric, TMs pearly, nares patent, no discharge or erythema, pharynx normal Oral cavity: MMM, no lesions Neck: supple, no lymphadenopathy, no thyromegaly, no masses Heart: RRR, normal S1, S2, no murmurs Lungs: CTA bilaterally, no wheezes, rhonchi, or rales Abdomen: +bs, soft, +epigastric tenderness, otherwise non tender, non distended, no masses, no hepatomegaly, no splenomegaly Back: nontender Pulses: 2+ symmetric, upper and lower extremities, normal cap refill    Assessment: Encounter Diagnoses  Name Primary?  . Chest discomfort Yes  . Epigastric pain   . Nausea without vomiting   . Dyspepsia  Plan: discuss symptoms, findings, and no sign of cardiac source of symptoms, no URI symptoms or concern for infection.   symptom most likely reflect dyspepsia vs GERD vs other GI issues.   GI cocktail given in office (benadryl 25mg , 2% viscous lidocaine for oral use 1 tsp, and 60ml of Mylanta).   Gave samples of dexilant to use BID for 2 days, then daily for 5- 7 days, and can use imodium OTC BID the next 5-7 days.   Avoid GERD triggers,  elevated head of bed, and if not improving in the next 4-5 days, call or recheck.

## 2016-05-11 ENCOUNTER — Ambulatory Visit (INDEPENDENT_AMBULATORY_CARE_PROVIDER_SITE_OTHER): Payer: 59 | Admitting: Medical

## 2016-05-11 ENCOUNTER — Encounter: Payer: Self-pay | Admitting: Medical

## 2016-05-11 VITALS — BP 136/80 | HR 84 | Temp 98.3°F

## 2016-05-11 DIAGNOSIS — E785 Hyperlipidemia, unspecified: Secondary | ICD-10-CM | POA: Diagnosis not present

## 2016-05-11 DIAGNOSIS — F17200 Nicotine dependence, unspecified, uncomplicated: Secondary | ICD-10-CM

## 2016-05-11 DIAGNOSIS — R42 Dizziness and giddiness: Secondary | ICD-10-CM | POA: Diagnosis not present

## 2016-05-11 DIAGNOSIS — R079 Chest pain, unspecified: Secondary | ICD-10-CM | POA: Diagnosis not present

## 2016-05-11 DIAGNOSIS — K219 Gastro-esophageal reflux disease without esophagitis: Secondary | ICD-10-CM

## 2016-05-11 DIAGNOSIS — E041 Nontoxic single thyroid nodule: Secondary | ICD-10-CM

## 2016-05-11 DIAGNOSIS — R894 Abnormal immunological findings in specimens from other organs, systems and tissues: Secondary | ICD-10-CM

## 2016-05-11 DIAGNOSIS — Z720 Tobacco use: Secondary | ICD-10-CM

## 2016-05-11 DIAGNOSIS — Z8679 Personal history of other diseases of the circulatory system: Secondary | ICD-10-CM

## 2016-05-11 DIAGNOSIS — R760 Raised antibody titer: Secondary | ICD-10-CM

## 2016-05-11 HISTORY — DX: Personal history of other diseases of the circulatory system: Z86.79

## 2016-05-11 HISTORY — DX: Chest pain, unspecified: R07.9

## 2016-05-11 NOTE — Progress Notes (Signed)
Subjective:    Kelly [name] is a 50 y.o. female who presents for evaluation of chest pain. Onset was 5 days ago. Symptoms have changed.   was having heartburn and chset pain earlier in the week, left shoulder pain, but now just chest pressure, dizziness.   been using PPI and zantac all week since that time. The patient describes the pain as pressure and does not radiate.  Associated symptoms are: dizziness. Aggravating factors are: none. Alleviating factors are: none. Patient's cardiac risk factors are: dyslipidemia, hypertension, obesity (BMI >= 30 kg/m2) and smoking/ tobacco exposure. Patient's risk factors for DVT/PE: on IUD. Previous cardiac testing: nuclear stress test 2016, EKG.  BPs were elevated today and yesterday, but have been normal otherwise for years.   Was on HCTZ in remote past, but BPs eventually normalized with lifestyle changes.  She is suppose to have labs with her yearly gyn visit next week.  The following portions of the patient's history were reviewed and updated as appropriate: allergies, current medications, past family history, past medical history, past social history, past surgical history and problem list.   Review of Systems Constitutional: denies fever, chills, sweats, unexpected weight change, anorexia, fatigue ENT: no runny nose, ear pain, sore throat, hoarseness, sinus pain, hearing loss, epistaxis Cardiology: denies palpitations, edema, orthopnea, paroxysmal nocturnal dyspnea Respiratory: denies cough, shortness of breath, dyspnea on exertion, wheezing, hemoptysis Gastroenterology: + abdominal pain, nausea, vomiting, diarrhea, constipation,  Hematology: denies bleeding or bruising problems Musculoskeletal: denies arthralgias, myalgias, joint swelling, back pain, neck pain, cramping, gait changes Ophthalmology: denies vision changes Urology: denies dysuria, difficulty urinating, hematuria, urinary frequency, urgency, incontinence Neurology: no headache, weakness,  tingling, numbness, speech abnormality, memory loss, falls, dizziness Psychology: denies depressed mood, agitation, sleep problems   Past Medical History:  Diagnosis Date  . Anticardiolipin antibody positive   . HSV-2 (herpes simplex virus 2) infection   . Hyperlipidemia   . Hypertension    diet controlled  . Renal calculus, right    Past Surgical History:  Procedure Laterality Date  . CESAREAN SECTION     X3   LAST ONE 11-30-2004  . CYSTOSCOPY W/ RETROGRADES Right 10/29/2012   Procedure: CYSTOSCOPY WITH RETROGRADE PYELOGRAM and stent placement;  Surgeon: Reece Packer, MD;  Location: [place];  Service: Urology;  Laterality: Right;  rt stent placement , rt retrograde and cysto   . CYSTOSCOPY W/ URETERAL STENT PLACEMENT Right 11/23/2012   Procedure: CYSTOSCOPY WITH STENT REPLACEMENT;  Surgeon: Alexis Frock, MD;  Location[facility];  Service: Urology;  Laterality: Right;  . CYSTOSCOPY/RETROGRADE/URETEROSCOPY/STONE EXTRACTION WITH BASKET Right 11/23/2012   Procedure: CYSTOSCOPY/RETROGRADE/URETEROSCOPY/STONE EXTRACTION WITH BASKET;  Surgeon: Alexis Frock, MD;  Location: [facility];  Service: Urology;  Laterality: Right;     Objective:    BP 136/80   Pulse 84   Temp 98.3 F (36.8 C) (Oral)   BP Readings from Last 3 Encounters:  05/11/16 136/80  05/04/16 130/90  11/23/15 118/82   Wt Readings from Last 3 Encounters:  06/16/15 171 lb (77.6 kg)  05/28/15 168 lb (76.2 kg)  04/07/15 169 lb (76.7 kg)    General appearance: alert, no distress, WD/WN, white female HEENT: normocephalic, conjunctiva/corneas normal, sclerae anicteric, PERRLA, EOMi, nares patent, no discharge or erythema, pharynx normal Oral cavity: MMM, tongue normal, teeth normal Neck: supple, no lymphadenopathy, no thyromegaly, no JVD, no bruits, no masses, normal ROM Chest: non tender, normal shape and expansion Heart: RRR, normal S1, S2, no  murmurs Lungs:  CTA bilaterally, no wheezes, rhonchi, or rales Abdomen: +bs, soft, non tender, non distended, no masses, no hepatomegaly, no splenomegaly, no bruits Back: non tender, normal ROM, no scoliosis Extremities: no edema, no cyanosis, no clubbing Pulses: 2+ symmetric, upper and lower extremities, normal cap refill Neurological: alert, oriented x 3, CN2-12 intact, strength normal upper extremities and lower extremities, sensation normal throughout, DTRs 2+ throughout, no cerebellar signs, gait normal Psychiatric: normal affect, behavior normal, pleasant      Adult ECG Report  Indication: chest pain, dizziness  Rate: 74 bpm  Rhythm: normal sinus rhythm  QRS Axis: 64 degrees  PR Interval: 137ms  QRS Duration: 22ms  QTc: 475ms  Conduction Disturbances: nonspecific T wave abnormality  Other Abnormalities: none  Patient's cardiac risk factors are: dyslipidemia, hypertension, obesity (BMI >= 30 kg/m2) and smoking/ tobacco exposure.  EKG comparison: none  Narrative Interpretation: no worrisome findings.    Assessment:   Encounter Diagnoses  Name Primary?  . Chest pain, unspecified chest pain type Yes  . Dizziness and giddiness   . History of hypertension   . Gastroesophageal reflux disease without esophagitis   . THYROID NODULE, RIGHT   . Anticardiolipin antibody positive   . Hyperlipidemia   . Current smoker      Plan:   EKG reviewed.  Reviewed labs from 2016 in chart, 2016 nuclear stress test, prior EKG.   I saw her recently this week for GERD, heartburn, and that is improving on PPI and H2 blocker.   Advised that BP elevated could just be from pain, stress of this week.  No other recent readings suggest consistently elevated BPs of late.   Advised she let me know in a few days how she is feeling.  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  She voices understanding and agreement with plan and recommendations;.

## 2016-05-15 NOTE — Addendum Note (Signed)
Addended by: Carlena Hurl on: 05/15/2016 08:40 PM   Modules accepted: Orders

## 2016-05-17 ENCOUNTER — Encounter: Payer: Self-pay | Admitting: Gynecology

## 2016-05-17 ENCOUNTER — Ambulatory Visit (INDEPENDENT_AMBULATORY_CARE_PROVIDER_SITE_OTHER): Payer: 59 | Admitting: Gynecology

## 2016-05-17 VITALS — BP 144/92 | Ht 63.5 in | Wt 180.0 lb

## 2016-05-17 DIAGNOSIS — R635 Abnormal weight gain: Secondary | ICD-10-CM

## 2016-05-17 DIAGNOSIS — Z113 Encounter for screening for infections with a predominantly sexual mode of transmission: Secondary | ICD-10-CM | POA: Diagnosis not present

## 2016-05-17 DIAGNOSIS — I10 Essential (primary) hypertension: Secondary | ICD-10-CM | POA: Diagnosis not present

## 2016-05-17 DIAGNOSIS — Z01419 Encounter for gynecological examination (general) (routine) without abnormal findings: Secondary | ICD-10-CM | POA: Diagnosis not present

## 2016-05-17 MED ORDER — ATORVASTATIN CALCIUM 20 MG PO TABS: 20.0000 mg | ORAL_TABLET | Freq: Every day | ORAL | 4 refills | Status: DC

## 2016-05-17 MED ORDER — HYDROCHLOROTHIAZIDE 12.5 MG PO CAPS: 12.5000 mg | ORAL_CAPSULE | Freq: Every day | ORAL | 11 refills | Status: DC

## 2016-05-17 MED FILL — HYDROCHLOROTHIAZIDE 12.5 MG: 12.5 | 90 days supply | Qty: 90 | Fill #0

## 2016-05-17 MED FILL — ATORVASTATIN 20 MG TABLET: 20 | 90 days supply | Qty: 90 | Fill #0

## 2016-05-17 NOTE — Progress Notes (Addendum)
[place] [birth year] [id number]   History:    50 y.o.  for annual gyn exam with past history of hypertension on no medication. She was found to have elevated blood pressure today with a reading of 144/92. Patient stated several weeks ago she had some headaches and was found to have elevated blood pressure but did not seek any medical attention. She was asymptomatic today her main concern has been also weight gain.Patient had the Mirena IUD placed for contraception and is due to be removed in 2019. Her father has had history of colon cancer patient had a colonoscop 7 years ago. She has not seen her PCP and is here fasting for blood work today. She has been taking Lipitor for hyperlipidemia. Patient with no previous history of any abnormal Pap smear.  Past medical history,surgical history, family history and social history were all reviewed and documented in the EPIC chart.  Gynecologic History No LMP recorded. Patient is not currently having periods (Reason: IUD). Contraception: IUD Last Pap: 2014. Results were: normal Last mammogram: 2017. Results were: Normal  Obstetric History OB History  Gravida Para Term Preterm AB Living  4 3 3   1 3   SAB TAB Ectopic Multiple Live Births          3    # Outcome Date GA Lbr Len/2nd Weight Sex Delivery Anes PTL Lv  4 AB           3 Term     F CS-Unspec  N LIV  2 Term     F CS-Unspec  N LIV  1 Term     M CS-Unspec  N LIV       ROS: A ROS was performed and pertinent positives and negatives are included in the history.  GENERAL: No fevers or chills. HEENT: No change in vision, no earache, sore throat or sinus congestion. NECK: No pain or stiffness. CARDIOVASCULAR: No chest pain or pressure. No palpitations. PULMONARY: No shortness of breath, cough or wheeze. GASTROINTESTINAL: No abdominal pain, nausea, vomiting or diarrhea, melena or bright red blood per rectum. GENITOURINARY: No urinary frequency, urgency, hesitancy or dysuria. MUSCULOSKELETAL:  No joint or muscle pain, no back pain, no recent trauma. DERMATOLOGIC: No rash, no itching, no lesions. ENDOCRINE: No polyuria, polydipsia, no heat or cold intolerance. No recent change in weight. HEMATOLOGICAL: No anemia or easy bruising or bleeding. NEUROLOGIC: No headache, seizures, numbness, tingling or weakness. PSYCHIATRIC: No depression, no loss of interest in normal activity or change in sleep pattern.     Exam: chaperone present  BP (!) 144/92   Ht 5' 3.5" (1.613 m)   Wt 180 lb (81.6 kg)   BMI 31.39 kg/m   Body mass index is 31.39 kg/m. Repeat blood pressure 142/88  General appearance : Well developed well nourished female. No acute distress HEENT: Eyes: no retinal hemorrhage or exudates,  Neck supple, trachea midline, no carotid bruits, no thyroidmegaly Lungs: Clear to auscultation, no rhonchi or wheezes, or rib retractions  Heart: Regular rate and rhythm, no murmurs or gallops Breast:Examined in sitting and supine position were symmetrical in appearance, no palpable masses or tenderness,  no skin retraction, no nipple inversion, no nipple discharge, no skin discoloration, no axillary or supraclavicular lymphadenopathy Abdomen: no palpable masses or tenderness, no rebound or guarding Extremities: no edema or skin discoloration or tenderness  Pelvic:  Bartholin, Urethra, Skene Glands: Within normal limits             Vagina:  No gross lesions or discharge  Cervix: No gross lesions or discharge  Uterus  anteverted, normal size, shape and consistency, non-tender and mobile  Adnexa  Without masses or tenderness  Anus and perineum  normal   Rectovaginal  normal sphincter tone without palpated masses or tenderness             Hemoccult overdue colonoscopy this year     Assessment/Plan:  50 y.o. female for annual exam with essential hypertension. We discussed importance of weight reduction as well as cutting down her salt intake. I'm going to begin her on a diuretic HCTZ 12.5 mg  daily. She is going to monitor her blood pressures at home keep a log and parameters were provided and if they are exceeded she is going to report to her primary care physician. The following screening blood work was ordered today: Comprehensive metabolic panel, fasting lipid profile, TSH, CBC, and urinalysis. Patient requesting an STD screen today for an HIV, RPR, hepatitis B and C will be drawn today as well along with her Pap smear GC and Chlamydia culture was obtained. Patient states that she will get her flu vaccine at work.   Terrance Mass MD, 2:44 PM 05/17/2016

## 2016-05-17 NOTE — Patient Instructions (Addendum)
Influenza Virus Vaccine (Flucelvax) What is this medicine? INFLUENZA VIRUS VACCINE (in floo EN zuh VAHY ruhs vak SEEN) helps to reduce the risk of getting influenza also known as the flu. The vaccine only helps protect you against some strains of the flu. This medicine may be used for other purposes; ask your health care provider or pharmacist if you have questions. What should I tell my health care provider before I take this medicine? They need to know if you have any of these conditions: -bleeding disorder like hemophilia -fever or infection -Guillain-Barre syndrome or other neurological problems -immune system problems -infection with the human immunodeficiency virus (HIV) or AIDS -low blood platelet counts -multiple sclerosis -an unusual or allergic reaction to influenza virus vaccine, other medicines, foods, dyes or preservatives -pregnant or trying to get pregnant -breast-feeding How should I use this medicine? This vaccine is for injection into a muscle. It is given by a health care professional. A copy of Vaccine Information Statements will be given before each vaccination. Read this sheet carefully each time. The sheet may change frequently. Talk to your pediatrician regarding the use of this medicine in children. Special care may be needed. Overdosage: If you think you've taken too much of this medicine contact a poison control center or emergency room at once. Overdosage: If you think you have taken too much of this medicine contact a poison control center or emergency room at once. NOTE: This medicine is only for you. Do not share this medicine with others. What if I miss a dose? This does not apply. What may interact with this medicine? -chemotherapy or radiation therapy -medicines that lower your immune system like etanercept, anakinra, infliximab, and adalimumab -medicines that treat or prevent blood clots like warfarin -phenytoin -steroid medicines like prednisone or  cortisone -theophylline -vaccines This list may not describe all possible interactions. Give your health care provider a list of all the medicines, herbs, non-prescription drugs, or dietary supplements you use. Also tell them if you smoke, drink alcohol, or use illegal drugs. Some items may interact with your medicine. What should I watch for while using this medicine? Report any side effects that do not go away within 3 days to your doctor or health care professional. Call your health care provider if any unusual symptoms occur within 6 weeks of receiving this vaccine. You may still catch the flu, but the illness is not usually as bad. You cannot get the flu from the vaccine. The vaccine will not protect against colds or other illnesses that may cause fever. The vaccine is needed every year. What side effects may I notice from receiving this medicine? Side effects that you should report to your doctor or health care professional as soon as possible: -allergic reactions like skin rash, itching or hives, swelling of the face, lips, or tongue Side effects that usually do not require medical attention (Report these to your doctor or health care professional if they continue or are bothersome.): -fever -headache -muscle aches and pains -pain, tenderness, redness, or swelling at the injection site -tiredness This list may not describe all possible side effects. Call your doctor for medical advice about side effects. You may report side effects to FDA at 1-800-FDA-1088. Where should I keep my medicine? The vaccine will be given by a health care professional in a clinic, pharmacy, doctor's office, or other health care setting. You will not be given vaccine doses to store at home. NOTE: This sheet is a summary. It may not cover   all possible information. If you have questions about this medicine, talk to your doctor, pharmacist, or health care provider.    2016, Elsevier/Gold Standard. (2011-07-20  14:06:47) Hydrochlorothiazide, HCTZ capsules or tablets What is this medicine? HYDROCHLOROTHIAZIDE (hye droe klor oh THYE a zide) is a diuretic. It increases the amount of urine passed, which causes the body to lose salt and water. This medicine is used to treat high blood pressure. It is also reduces the swelling and water retention caused by various medical conditions, such as heart, liver, or kidney disease. This medicine may be used for other purposes; ask your health care provider or pharmacist if you have questions. What should I tell my health care provider before I take this medicine? They need to know if you have any of these conditions: -diabetes -gout -immune system problems, like lupus -kidney disease or kidney stones -liver disease -pancreatitis -small amount of urine or difficulty passing urine -an unusual or allergic reaction to hydrochlorothiazide, sulfa drugs, other medicines, foods, dyes, or preservatives -pregnant or trying to get pregnant -breast-feeding How should I use this medicine? Take this medicine by mouth with a glass of water. Follow the directions on the prescription label. Take your medicine at regular intervals. Remember that you will need to pass urine frequently after taking this medicine. Do not take your doses at a time of day that will cause you problems. Do not stop taking your medicine unless your doctor tells you to. Talk to your pediatrician regarding the use of this medicine in children. Special care may be needed. Overdosage: If you think you have taken too much of this medicine contact a poison control center or emergency room at once. NOTE: This medicine is only for you. Do not share this medicine with others. What if I miss a dose? If you miss a dose, take it as soon as you can. If it is almost time for your next dose, take only that dose. Do not take double or extra doses. What may interact with this  medicine? -cholestyramine -colestipol -digoxin -dofetilide -lithium -medicines for blood pressure -medicines for diabetes -medicines that relax muscles for surgery -other diuretics -steroid medicines like prednisone or cortisone This list may not describe all possible interactions. Give your health care provider a list of all the medicines, herbs, non-prescription drugs, or dietary supplements you use. Also tell them if you smoke, drink alcohol, or use illegal drugs. Some items may interact with your medicine. What should I watch for while using this medicine? Visit your doctor or health care professional for regular checks on your progress. Check your blood pressure as directed. Ask your doctor or health care professional what your blood pressure should be and when you should contact him or her. You may need to be on a special diet while taking this medicine. Ask your doctor. Check with your doctor or health care professional if you get an attack of severe diarrhea, nausea and vomiting, or if you sweat a lot. The loss of too much body fluid can make it dangerous for you to take this medicine. You may get drowsy or dizzy. Do not drive, use machinery, or do anything that needs mental alertness until you know how this medicine affects you. Do not stand or sit up quickly, especially if you are an older patient. This reduces the risk of dizzy or fainting spells. Alcohol may interfere with the effect of this medicine. Avoid alcoholic drinks. This medicine may affect your blood sugar level. If you have  diabetes, check with your doctor or health care professional before changing the dose of your diabetic medicine. This medicine can make you more sensitive to the sun. Keep out of the sun. If you cannot avoid being in the sun, wear protective clothing and use sunscreen. Do not use sun lamps or tanning beds/booths. What side effects may I notice from receiving this medicine? Side effects that you should  report to your doctor or health care professional as soon as possible: -allergic reactions such as skin rash or itching, hives, swelling of the lips, mouth, tongue, or throat -changes in vision -chest pain -eye pain -fast or irregular heartbeat -feeling faint or lightheaded, falls -gout attack -muscle pain or cramps -pain or difficulty when passing urine -pain, tingling, numbness in the hands or feet -redness, blistering, peeling or loosening of the skin, including inside the mouth -unusually weak or tired Side effects that usually do not require medical attention (report to your doctor or health care professional if they continue or are bothersome): -change in sex drive or performance -dry mouth -headache -stomach upset This list may not describe all possible side effects. Call your doctor for medical advice about side effects. You may report side effects to FDA at 1-800-FDA-1088. Where should I keep my medicine? Keep out of the reach of children. Store at room temperature between 15 and 30 degrees C (59 and 86 degrees F). Do not freeze. Protect from light and moisture. Keep container closed tightly. Throw away any unused medicine after the expiration date. NOTE: This sheet is a summary. It may not cover all possible information. If you have questions about this medicine, talk to your doctor, pharmacist, or health care provider.    2016, Elsevier/Gold Standard. (2010-04-02 12:57:37) Hypertension Hypertension, commonly called high blood pressure, is when the force of blood pumping through your arteries is too strong. Your arteries are the blood vessels that carry blood from your heart throughout your body. A blood pressure reading consists of a higher number over a lower number, such as 110/72. The higher number (systolic) is the pressure inside your arteries when your heart pumps. The lower number (diastolic) is the pressure inside your arteries when your heart relaxes. Ideally you want  your blood pressure below 120/80. Hypertension forces your heart to work harder to pump blood. Your arteries may become narrow or stiff. Having untreated or uncontrolled hypertension can cause heart attack, stroke, kidney disease, and other problems. RISK FACTORS Some risk factors for high blood pressure are controllable. Others are not.  Risk factors you cannot control include:   Race. You may be at higher risk if you are African American.  Age. Risk increases with age.  Gender. Men are at higher risk than women before age 31 years. After age 14, women are at higher risk than men. Risk factors you can control include:  Not getting enough exercise or physical activity.  Being overweight.  Getting too much fat, sugar, calories, or salt in your diet.  Drinking too much alcohol. SIGNS AND SYMPTOMS Hypertension does not usually cause signs or symptoms. Extremely high blood pressure (hypertensive crisis) may cause headache, anxiety, shortness of breath, and nosebleed. DIAGNOSIS To check if you have hypertension, your health care provider will measure your blood pressure while you are seated, with your arm held at the level of your heart. It should be measured at least twice using the same arm. Certain conditions can cause a difference in blood pressure between your right and left arms. A blood  pressure reading that is higher than normal on one occasion does not mean that you need treatment. If it is not clear whether you have high blood pressure, you may be asked to return on a different day to have your blood pressure checked again. Or, you may be asked to monitor your blood pressure at home for 1 or more weeks. TREATMENT Treating high blood pressure includes making lifestyle changes and possibly taking medicine. Living a healthy lifestyle can help lower high blood pressure. You may need to change some of your habits. Lifestyle changes may include:  Following the DASH diet. This diet is high  in fruits, vegetables, and whole grains. It is low in salt, red meat, and added sugars.  Keep your sodium intake below 2,300 mg per day.  Getting at least 30-45 minutes of aerobic exercise at least 4 times per week.  Losing weight if necessary.  Not smoking.  Limiting alcoholic beverages.  Learning ways to reduce stress. Your health care provider may prescribe medicine if lifestyle changes are not enough to get your blood pressure under control, and if one of the following is true:  You are 21-28 years of age and your systolic blood pressure is above 140.  You are 8 years of age or older, and your systolic blood pressure is above 150.  Your diastolic blood pressure is above 90.  You have diabetes, and your systolic blood pressure is over [id number] or your diastolic blood pressure is over 90.  You have kidney disease and your blood pressure is above 140/90.  You have heart disease and your blood pressure is above 140/90. Your personal target blood pressure may vary depending on your medical conditions, your age, and other factors. HOME CARE INSTRUCTIONS  Have your blood pressure rechecked as directed by your health care provider.   Take medicines only as directed by your health care provider. Follow the directions carefully. Blood pressure medicines must be taken as prescribed. The medicine does not work as well when you skip doses. Skipping doses also puts you at risk for problems.  Do not smoke.   Monitor your blood pressure at home as directed by your health care provider. SEEK MEDICAL CARE IF:   You think you are having a reaction to medicines taken.  You have recurrent headaches or feel dizzy.  You have swelling in your ankles.  You have trouble with your vision. SEEK IMMEDIATE MEDICAL CARE IF:  You develop a severe headache or confusion.  You have unusual weakness, numbness, or feel faint.  You have severe chest or abdominal pain.  You vomit repeatedly.  You  have trouble breathing. MAKE SURE YOU:   Understand these instructions.  Will watch your condition.  Will get help right away if you are not doing well or get worse.   This information is not intended to replace advice given to you by your health care provider. Make sure you discuss any questions you have with your health care provider.   Document Released: 08/08/2005 Document Revised: 12/23/2014 Document Reviewed: 05/31/2013 Elsevier Interactive Patient Education [employer].

## 2016-05-18 LAB — CBC WITH DIFFERENTIAL/PLATELET
Basophils Absolute: 0 cells/uL (ref 0–200)
Basophils Relative: 0 %
Eosinophils Absolute: 204 cells/uL (ref 15–500)
Eosinophils Relative: 2 %
HCT: 36.8 % (ref 35.0–45.0)
Hemoglobin: 12.5 g/dL (ref 11.7–15.5)
Lymphocytes Relative: 28 %
Lymphs Abs: 2856 cells/uL (ref 850–3900)
MCH: 30.4 pg (ref 27.0–33.0)
MCHC: 34 g/dL (ref 32.0–36.0)
MCV: 89.5 fL (ref 80.0–100.0)
MPV: 9.2 fL (ref 7.5–12.5)
Monocytes Absolute: 510 cells/uL (ref 200–950)
Monocytes Relative: 5 %
Neutro Abs: 6630 cells/uL (ref 1500–7800)
Neutrophils Relative %: 65 %
Platelets: 274 10*3/uL (ref 140–400)
RBC: 4.11 MIL/uL (ref 3.80–5.10)
RDW: 13.3 % (ref 11.0–15.0)
WBC: 10.2 10*3/uL (ref 3.8–10.8)

## 2016-05-18 LAB — LIPID PANEL
Cholesterol: 210 mg/dL — ABNORMAL HIGH (ref 125–200)
HDL: 33 mg/dL — ABNORMAL LOW (ref >=46)
Total CHOL/HDL Ratio: 6.4 Ratio — ABNORMAL HIGH (ref <=5.0)
Triglycerides: 467 mg/dL — ABNORMAL HIGH (ref <150)

## 2016-05-18 LAB — URINALYSIS W MICROSCOPIC + REFLEX CULTURE
Bacteria, UA: NONE SEEN [HPF]
Bilirubin Urine: NEGATIVE
Casts: NONE SEEN [LPF]
Crystals: NONE SEEN [HPF]
Glucose, UA: NEGATIVE
Hgb urine dipstick: NEGATIVE
Ketones, ur: NEGATIVE
Leukocytes, UA: NEGATIVE
Nitrite: NEGATIVE
Protein, ur: NEGATIVE
RBC / HPF: NONE SEEN RBC/HPF (ref <=2)
Specific Gravity, Urine: 1.015 (ref 1.001–1.035)
Squamous Epithelial / LPF: NONE SEEN [HPF] (ref <=5)
WBC, UA: NONE SEEN WBC/HPF (ref <=5)
Yeast: NONE SEEN [HPF]
pH: 6 (ref 5.0–8.0)

## 2016-05-18 LAB — COMPREHENSIVE METABOLIC PANEL
ALT: 15 U/L (ref 6–29)
AST: 17 U/L (ref 10–35)
Albumin: 4 g/dL (ref 3.6–5.1)
Alkaline Phosphatase: 76 U/L (ref 33–115)
BUN: 9 mg/dL (ref 7–25)
CO2: 23 mmol/L (ref 20–31)
Calcium: 8.9 mg/dL (ref 8.6–10.2)
Chloride: 104 mmol/L (ref 98–110)
Creat: 0.71 mg/dL (ref 0.50–1.10)
Glucose, Bld: 82 mg/dL (ref 65–99)
Potassium: 3.5 mmol/L (ref 3.5–5.3)
Sodium: 138 mmol/L (ref 135–146)
Total Bilirubin: 0.5 mg/dL (ref 0.2–1.2)
Total Protein: 6.4 g/dL (ref 6.1–8.1)

## 2016-05-18 LAB — HEPATITIS B SURFACE ANTIGEN: Hepatitis B Surface Ag: NEGATIVE

## 2016-05-18 LAB — HIV ANTIBODY (ROUTINE TESTING W REFLEX): HIV 1&2 Ab, 4th Generation: NONREACTIVE

## 2016-05-18 LAB — HEPATITIS C ANTIBODY: HCV Ab: NEGATIVE

## 2016-05-18 LAB — TSH: TSH: 1.76 mIU/L

## 2016-05-18 LAB — RPR

## 2016-05-19 LAB — PAP IG, CT-NG NAA, HPV HIGH-RISK
Chlamydia Probe Amp: NOT DETECTED
GC Probe Amp: NOT DETECTED
HPV DNA High Risk: NOT DETECTED

## 2016-05-20 ENCOUNTER — Other Ambulatory Visit: Payer: Self-pay | Admitting: Medical

## 2016-05-20 ENCOUNTER — Ambulatory Visit (INDEPENDENT_AMBULATORY_CARE_PROVIDER_SITE_OTHER): Payer: 59 | Admitting: Medical

## 2016-05-20 ENCOUNTER — Encounter: Payer: Self-pay | Admitting: Medical

## 2016-05-20 VITALS — BP 100/64 | HR 86 | Temp 98.6°F | Ht 63.5 in

## 2016-05-20 DIAGNOSIS — R197 Diarrhea, unspecified: Secondary | ICD-10-CM | POA: Insufficient documentation

## 2016-05-20 HISTORY — DX: Diarrhea, unspecified: R19.7

## 2016-05-20 MED ORDER — METRONIDAZOLE 500 MG PO TABS: 500.0000 mg | ORAL_TABLET | Freq: Three times a day (TID) | ORAL | 0 refills | Status: DC

## 2016-05-20 MED FILL — metroNIDAZOLE 500 MG TABS: 500 | 8 days supply | Qty: 21 | Fill #0

## 2016-05-20 NOTE — Progress Notes (Signed)
Subjective: Chief Complaint  Patient presents with  . Abdominal Pain    x2 weeks, diarrhea, imodium does not seem to alleviate   Here for diarrhea.  I saw her recently for epigastric abdominal pain, but after using Dexilant, limiting GERD triggers, is much improved.   She hasn't been using the Dexlinat every daily.    However, 2 weeks or so ago developed new problems with diarrhea.   Been having about 10 watery, large volume diarrhea stools daily, foul odor, but not green, no blood visible.  Her daughter has mild few loose stools few weeks ago, but not like hers.   She notes that prior to the diarrhea, usually is a 2 soft formed BMs daily, no routine problems with constipation and diarrhea prior.    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   She has been using 3-4 imodium daily, IB Gard probiotic.   No nausea, no vomiting.   No recent known exposure to c diff, bowel infection otherwise, no recent antibiotics otherwise. No other aggravating or relieving factors. No other complaint.  Past Medical History:  Diagnosis Date  . Anticardiolipin antibody positive   . HSV-2 (herpes simplex virus 2) infection   . Hyperlipidemia   . Hypertension    diet controlled  . Renal calculus, right    Current Outpatient Prescriptions on File Prior to Visit  Medication Sig Dispense Refill  . atorvastatin (LIPITOR) 20 MG tablet Take 1 tablet (20 mg total) by mouth at bedtime. 90 tablet 4  . Azelastine-Fluticasone (DYMISTA) 137-50 MCG/ACT SUSP Place 1 spray into the nose 2 (two) times daily. 1 Bottle 0  . Dexlansoprazole (DEXILANT PO) Take by mouth.    . hydrochlorothiazide (MICROZIDE) 12.5 MG capsule Take 1 capsule (12.5 mg total) by mouth daily. Take one every morning 30 capsule 11  . ibuprofen (ADVIL,MOTRIN) 200 MG tablet Take 200 mg by mouth as needed for pain. Reported on 11/23/2015    . levonorgestrel (MIRENA) 20 MCG/24HR IUD 1  each by Intrauterine route once.    . Multiple Vitamin (MULTIVITAMIN WITH MINERALS) TABS Take 1 tablet by mouth daily.     No current facility-administered medications on file prior to visit.    Past Surgical History:  Procedure Laterality Date  . CESAREAN SECTION     X3   LAST ONE 11-30-2004  . CYSTOSCOPY W/ RETROGRADES Right 10/29/2012   Procedure: CYSTOSCOPY WITH RETROGRADE PYELOGRAM and stent placement;  Surgeon: Reece Packer, MD;  Location: [facility];  Service: Urology;  Laterality: Right;  rt stent placement , rt retrograde and cysto   . CYSTOSCOPY W/ URETERAL STENT PLACEMENT Right 11/23/2012   Procedure: CYSTOSCOPY WITH STENT REPLACEMENT;  Surgeon: Alexis Frock, MD;  Location: [facility];  Service: Urology;  Laterality: Right;  . CYSTOSCOPY/RETROGRADE/URETEROSCOPY/STONE EXTRACTION WITH BASKET Right 11/23/2012   Procedure: CYSTOSCOPY/RETROGRADE/URETEROSCOPY/STONE EXTRACTION WITH BASKET;  Surgeon: Alexis Frock, MD;  Location: [facility];  Service: Urology;  Laterality: Right;   ROS as in subjective   Objective: BP 100/64 (BP Location: Right Arm, Patient Position: Sitting, Cuff Size: Large)   Pulse 86   Temp 98.6 F (37 C) (Oral)   Ht 5' 3.5" (1.613 m)   SpO2 98%   General appearance: alert, no distress, WD/WN Oral cavity: MMM, no lesions Neck: supple, no lymphadenopathy, no thyromegaly, no masses Heart: RRR, normal S1, S2, no murmurs Lungs: CTA bilaterally, no wheezes, rhonchi,  or rales Abdomen: +increased bs, soft, generalized upper abdominal tenderness, non distended, no masses, no hepatomegaly, no splenomegaly Pulses: 2+ symmetric, upper and lower extremities, normal cap refill    Assessment: Encounter Diagnosis  Name Primary?  . Diarrhea, unspecified type Yes    Plan: discussed symptoms, concerns, possible differential.   She will turn in stool samples for stool studies.    Advised she stop Dexilant and  imodium.   C/t probiotic.   Avoid spicy, greasy and acidic foods.   Once she turns in stool samples, can either wait out studies or begin Metronidazole.   discussed risks/benefits of medication.  Hydrate well.   Letticia was seen today for abdominal pain.  Diagnoses and all orders for this visit:  Diarrhea, unspecified type -     Clostridium difficile EIA -     Stool culture -     Ova and parasite examination  Other orders -     metroNIDAZOLE (FLAGYL) 500 MG tablet; Take 1 tablet (500 mg total) by mouth 3 (three) times daily.

## 2016-05-21 LAB — C. DIFFICILE GDH AND TOXIN A/B
C. difficile GDH: NOT DETECTED
C. difficile Toxin A/B: NOT DETECTED

## 2016-05-24 LAB — OVA AND PARASITE EXAMINATION: OP: NONE SEEN

## 2016-05-24 LAB — STOOL CULTURE

## 2016-06-10 ENCOUNTER — Other Ambulatory Visit: Payer: Self-pay

## 2016-06-10 ENCOUNTER — Ambulatory Visit (INDEPENDENT_AMBULATORY_CARE_PROVIDER_SITE_OTHER): Payer: 59 | Admitting: Family Medicine

## 2016-06-10 ENCOUNTER — Encounter: Payer: Self-pay | Admitting: Family Medicine

## 2016-06-10 VITALS — BP 120/82 | HR 111

## 2016-06-10 DIAGNOSIS — Z8669 Personal history of other diseases of the nervous system and sense organs: Secondary | ICD-10-CM | POA: Diagnosis not present

## 2016-06-10 DIAGNOSIS — R51 Headache: Secondary | ICD-10-CM

## 2016-06-10 DIAGNOSIS — R519 Headache, unspecified: Secondary | ICD-10-CM

## 2016-06-10 HISTORY — DX: Personal history of other diseases of the nervous system and sense organs: Z86.69

## 2016-06-10 MED ORDER — ALMOTRIPTAN MALATE 12.5 MG PO TABS: 12.5000 mg | ORAL_TABLET | ORAL | 0 refills | Status: DC | PRN

## 2016-06-10 MED ORDER — TRAMADOL HCL 50 MG PO TABS: 50.0000 mg | ORAL_TABLET | Freq: Three times a day (TID) | ORAL | 0 refills | Status: DC | PRN

## 2016-06-10 MED FILL — traMADol HCL 50 MG TABS: 50 | 10 days supply | Qty: 30 | Fill #0

## 2016-06-10 MED FILL — ALMOTRIPTAN MALATE 12.5 MG: 12.5 | 30 days supply | Qty: 9 | Fill #0

## 2016-06-10 NOTE — Progress Notes (Signed)
   Subjective:    Patient ID: Kelly [name], female    DOB: 1966-03-04, 50 y.o.   MRN: [id number]  HPI She complains of a one-week history of daily right-sided headache usually behind the right eye and into her neck. Also associated with some dizziness. She also notes increased sensitivity to smell, light and noise. Coughing and bending does make the headache worse. No blurred vision, double vision. She has been using ibuprofen 800 mg 3 times per day and has also tried Aleve without relief. She does complain of some right arm discomfort as well. She has a previous history of difficulty with migraine and sensitivity to triptan's but states that Axert did not cause her any difficulty. She describes a side effect of chest discomfort with the triptan's but no rash itching or swelling.  Review of Systems     Objective:   Physical Exam Alert and in no distress.  EOMI ; other cranial nerves grossly intact DTRs normal. Cerebellar testing showed no lateralizing signs. Tympanic membranes and canals are normal. Pharyngeal area is normal. Neck is supple without adenopathy or thyromegaly. Cardiac exam shows a regular sinus rhythm without murmurs or gallops. Lungs are clear to auscultation.        Assessment & Plan:  Nonintractable headache, unspecified chronicity pattern, unspecified headache type - Plan: almotriptan (AXERT) 12.5 MG tablet, traMADol (ULTRAM) 50 MG tablet  History of migraine Her symptoms are most consistent with a migraine variant. I did not detect any lateralizing signs to make me concerned. A sample of Relpax was given with no relief of her symptoms. I will switch her to Axert which has worked in the past and also give tramadol. If she continues have difficulty, referral to neurology will be made.

## 2016-06-17 ENCOUNTER — Ambulatory Visit (INDEPENDENT_AMBULATORY_CARE_PROVIDER_SITE_OTHER): Payer: 59 | Admitting: Medical

## 2016-06-17 VITALS — BP 124/80 | HR 107 | Temp 99.0°F | Resp 16

## 2016-06-17 DIAGNOSIS — G43901 Migraine, unspecified, not intractable, with status migrainosus: Secondary | ICD-10-CM | POA: Diagnosis not present

## 2016-06-17 DIAGNOSIS — R519 Headache, unspecified: Secondary | ICD-10-CM

## 2016-06-17 DIAGNOSIS — R51 Headache: Secondary | ICD-10-CM | POA: Diagnosis not present

## 2016-06-17 MED ORDER — BUTALBITAL-APAP-CAFFEINE 50-325-40 MG PO TABS: 1.0000 | ORAL_TABLET | Freq: Four times a day (QID) | ORAL | 0 refills | Status: DC | PRN

## 2016-06-17 MED ORDER — METOPROLOL TARTRATE 25 MG PO TABS: 25.0000 mg | ORAL_TABLET | Freq: Two times a day (BID) | ORAL | 5 refills | Status: DC

## 2016-06-17 MED FILL — METOPROLOL TARTRATE 25 MG T: 25 | 90 days supply | Qty: 180 | Fill #0

## 2016-06-17 MED FILL — BUTALBITAL/APAP/CAFFEINE TB: 50-325-40 | 4 days supply | Qty: 20 | Fill #0

## 2016-06-17 NOTE — Progress Notes (Signed)
Subjective: Chief Complaint  Patient presents with  . Headache    continued headaches daily. saw J[facility] last week with no improvement.    Here for ongoing headaches.  Saw Dr. Redmond School for same last week.  Has headache daily for 3 weeks.  Not awakening with headaches.    Headaches typically come on 9-10 am.   Last hours.   Some photophobia, some phonophobia.   Mainly starts temporal, but gradually with get discomfort in back of head or neck.   Last week had some numbness in right arm, then on another day in leg.   Has dizziness.   Using axert without relief.  axert has always worked in the past. No fever, has seasonal allergies but not a lot worse than usual.   Drinks 2 cups of caffeine a day, drinks a lot of water.  Not eating a lot of chocolate.  No recent beer or wine.   Doesn't eat a lot of ages meats and cheeses.   No vision or hearing changes.    Has nausea, but no vomiting.  Started HCTZ a month ago through gynecology.  No concern for sinus infection.  No nosebleeds.  Relpax was given at time of visit last visit here but it didn't help.  No recent head imaging.  No tearing, no runny nose.   Headaches start in the morning, lasts all day.   When headaches are real bad, she will say words wrong.   In the past has seen headache center years ago, [facility].  Hx/o migraines.  No hx/o cluster headaches.  No other aggravating or relieving factors. No other complaint.  Past Medical History:  Diagnosis Date  . Anticardiolipin antibody positive   . HSV-2 (herpes simplex virus 2) infection   . Hyperlipidemia   . Hypertension    diet controlled  . Renal calculus, right    Current Outpatient Prescriptions on File Prior to Visit  Medication Sig Dispense Refill  . almotriptan (AXERT) 12.5 MG tablet Take 1 tablet (12.5 mg total) by mouth as needed for migraine. may repeat in 2 hours if needed 10 tablet 0  . atorvastatin (LIPITOR) 20 MG tablet Take 1 tablet (20 mg total) by mouth at bedtime. 90  tablet 4  . Azelastine-Fluticasone (DYMISTA) 137-50 MCG/ACT SUSP Place 1 spray into the nose 2 (two) times daily. 1 Bottle 0  . ibuprofen (ADVIL,MOTRIN) 200 MG tablet Take 200 mg by mouth as needed for pain. Reported on 11/23/2015    . levonorgestrel (MIRENA) 20 MCG/24HR IUD 1 each by Intrauterine route once.    . Multiple Vitamin (MULTIVITAMIN WITH MINERALS) TABS Take 1 tablet by mouth daily.    . traMADol (ULTRAM) 50 MG tablet Take 1 tablet (50 mg total) by mouth every 8 (eight) hours as needed. 30 tablet 0  . Dexlansoprazole (DEXILANT PO) Take by mouth.     No current facility-administered medications on file prior to visit.    Family History  Problem Relation Age of Onset  . Diabetes Father   . Hypertension Father   . Cancer Father     colon cancer  . Kidney Stones Father   . Heart disease Mother 44    died suddenly of MI  . Heart disease Maternal Uncle 37    died of MI  . Heart disease Maternal Uncle   . Breast cancer Maternal Grandmother    ROS as in subjective   Objective: BP 124/80   Pulse (!) 107   Temp 99  F (37.2 C) (Oral)   Resp 16   BP Readings from Last 3 Encounters:  06/17/16 124/80  06/10/16 120/82  05/20/16 100/64   Wt Readings from Last 3 Encounters:  05/17/16 180 lb (81.6 kg)  06/16/15 171 lb (77.6 kg)  05/28/15 168 lb (76.2 kg)   General appearance: alert, no distress, WD/WN,  HEENT: normocephalic, sclerae anicteric, PERRLA, EOMi, nares patent, no discharge or erythema, pharynx normal Oral cavity: MMM, no lesions Neck: supple, no lymphadenopathy, no thyromegaly, no masses, no bruits Heart: RRR, normal S1, S2, no murmurs Lungs: CTA bilaterally, no wheezes, rhonchi, or rales Extremities: no edema, no cyanosis, no clubbing Pulses: 2+ symmetric, upper and lower extremities, normal cap refill Neurological: alert, oriented x 3, CN2-12 intact, strength normal upper extremities and lower extremities, sensation normal throughout, DTRs 2+ throughout, no  cerebellar signs, gait normal Psychiatric: normal affect, behavior normal, pleasant     Assessment: Encounter Diagnoses  Name Primary?  . Migraine with status migrainosus, not intractable, unspecified migraine type Yes  . Worsening headaches      Plan: Headaches - begin Fioricet prn for acute headaches.   Refer to neurology.  In the meantime begin metoprolol, stop HCTZ.   Discussed symptoms that would prompt urgent eval otherwise.    Kelly [name] was seen today for headache.  Diagnoses and all orders for this visit:  Migraine with status migrainosus, not intractable, unspecified migraine type -     Ambulatory referral to Neurology  Worsening headaches -     Ambulatory referral to Neurology  Other orders -     butalbital-acetaminophen-caffeine (FIORICET, ESGIC) 50-325-40 MG tablet; Take 1-2 tablets by mouth every 6 (six) hours as needed for headache. -     metoprolol tartrate (LOPRESSOR) 25 MG tablet; Take 1 tablet (25 mg total) by mouth 2 (two) times daily. 1 tablet po BID for blood pressure

## 2016-06-23 ENCOUNTER — Other Ambulatory Visit: Payer: Self-pay | Admitting: Medical

## 2016-06-23 MED ORDER — HYDROCODONE-ACETAMINOPHEN 5-325 MG PO TABS: 1.0000 | ORAL_TABLET | Freq: Four times a day (QID) | ORAL | 0 refills | Status: DC | PRN

## 2016-06-23 MED ORDER — PREDNISONE 10 MG (21) PO TBPK: 10.0000 mg | ORAL_TABLET | Freq: Every day | ORAL | 0 refills | Status: DC

## 2016-06-23 MED FILL — predniSONE 10 MG TABS: 10 | 6 days supply | Qty: 21 | Fill #0

## 2016-06-23 MED FILL — HYDROCODON-APAP 5-325: 5-325 | 3 days supply | Qty: 10 | Fill #0

## 2016-06-29 ENCOUNTER — Encounter: Payer: Self-pay | Admitting: Medical

## 2016-06-29 ENCOUNTER — Telehealth: Payer: Self-pay | Admitting: Medical

## 2016-06-29 ENCOUNTER — Ambulatory Visit (INDEPENDENT_AMBULATORY_CARE_PROVIDER_SITE_OTHER): Payer: 59 | Admitting: Medical

## 2016-06-29 ENCOUNTER — Other Ambulatory Visit: Payer: Self-pay | Admitting: Medical

## 2016-06-29 VITALS — BP 118/72 | HR 87

## 2016-06-29 DIAGNOSIS — R110 Nausea: Secondary | ICD-10-CM

## 2016-06-29 DIAGNOSIS — R21 Rash and other nonspecific skin eruption: Secondary | ICD-10-CM | POA: Diagnosis not present

## 2016-06-29 DIAGNOSIS — R51 Headache: Secondary | ICD-10-CM | POA: Diagnosis not present

## 2016-06-29 DIAGNOSIS — R519 Headache, unspecified: Secondary | ICD-10-CM

## 2016-06-29 DIAGNOSIS — R202 Paresthesia of skin: Secondary | ICD-10-CM

## 2016-06-29 DIAGNOSIS — R200 Anesthesia of skin: Secondary | ICD-10-CM

## 2016-06-29 LAB — CBC
HCT: 39.5 % (ref 35.0–45.0)
Hemoglobin: 13.6 g/dL (ref 11.7–15.5)
MCH: 30.9 pg (ref 27.0–33.0)
MCHC: 34.4 g/dL (ref 32.0–36.0)
MCV: 89.8 fL (ref 80.0–100.0)
MPV: 9 fL (ref 7.5–12.5)
Platelets: 357 10*3/uL (ref 140–400)
RBC: 4.4 MIL/uL (ref 3.80–5.10)
RDW: 13.6 % (ref 11.0–15.0)
WBC: 13 10*3/uL — ABNORMAL HIGH (ref 4.0–10.5)

## 2016-06-29 LAB — BASIC METABOLIC PANEL
BUN: 16 mg/dL (ref 7–25)
CO2: 29 mmol/L (ref 20–31)
Calcium: 9.1 mg/dL (ref 8.6–10.2)
Chloride: 103 mmol/L (ref 98–110)
Creat: 0.67 mg/dL (ref 0.50–1.10)
Glucose, Bld: 78 mg/dL (ref 65–99)
Potassium: 3.9 mmol/L (ref 3.5–5.3)
Sodium: 140 mmol/L (ref 135–146)

## 2016-06-29 MED ORDER — OXYCODONE-ACETAMINOPHEN 7.5-325 MG PO TABS: 1.0000 | ORAL_TABLET | ORAL | 0 refills | Status: DC | PRN

## 2016-06-29 MED ORDER — AMOXICILLIN 500 MG PO TABS
ORAL_TABLET | ORAL | 0 refills | Status: DC
Start: 2016-06-29 — End: 2016-07-28

## 2016-06-29 MED FILL — OXYCODONE-APAP 7.5-325 MG: 7.5-325 | 3 days supply | Qty: 15 | Fill #0

## 2016-06-29 MED FILL — AMOXICILLIN 500 MG CAPSULE: 500 | 10 days supply | Qty: 40 | Fill #0

## 2016-06-29 NOTE — Progress Notes (Signed)
Subjective: Chief Complaint  Patient presents with  . headaches    headaches x1 month  not getting better, dizzness , light headed    Here accompanied by her sister Beverlee Nims.  Here for persistent headache, worsening symptoms despite several recent interventions.  At this point she has had close to 4 week hx/o daily headache.  Initially saw Dr. Redmond School here for this the fires week, but headache didn't improve with Axert, whereas this has always worked in the past.   She notes aches in neck, back of head, coughing makes the headache worse, does feel dizzy at times.   No incontinence.   In the past few weeks has had some right hand tingling, numbness in right arm.  Headaches have let up some with hydrocodone and prednisone taper.   We changes from HCTZ to metoprolol last visit as this was newly started about the time the headaches came on shortly after starting HCTZ.  However since last week changing to metoprolol hasn't helped.    Has had daily headache for around 4 weeks now.   Not awakening with headaches.    Headaches typically come on 9-10 am.   Last hours.   Some photophobia, some phonophobia.   Mainly starts temporal, but gradually with get discomfort in back of head or neck.   Last week had some numbness in right arm, then on another day in leg.   Has dizziness.   No fever, has seasonal allergies but not a lot worse than usual.   Drinks 2 cups of caffeine a day, drinks a lot of water.  Not eating a lot of chocolate.  No recent beer or wine.   Doesn't eat a lot of ages meats and cheeses.   No vision or hearing changes.    Has nausea, but no vomiting.  No recent head imaging.  No tearing, no runny nose.   Headaches start in the morning, lasts all day.   When headaches are real bad, she will say words wrong.   In the past has seen headache center years ago, [facility].  Hx/o migraines.  No hx/o cluster headaches. She had rash few weeks ago after she had been camping, so now worried about lyme  disease.  No other aggravating or relieving factors. No other complaint.  Past Medical History:  Diagnosis Date  . Anticardiolipin antibody positive   . HSV-2 (herpes simplex virus 2) infection   . Hyperlipidemia   . Hypertension    diet controlled  . Renal calculus, right    Current Outpatient Prescriptions on File Prior to Visit  Medication Sig Dispense Refill  . almotriptan (AXERT) 12.5 MG tablet Take 1 tablet (12.5 mg total) by mouth as needed for migraine. may repeat in 2 hours if needed 10 tablet 0  . atorvastatin (LIPITOR) 20 MG tablet Take 1 tablet (20 mg total) by mouth at bedtime. 90 tablet 4  . Azelastine-Fluticasone (DYMISTA) 137-50 MCG/ACT SUSP Place 1 spray into the nose 2 (two) times daily. 1 Bottle 0  . butalbital-acetaminophen-caffeine (FIORICET, ESGIC) 50-325-40 MG tablet Take 1-2 tablets by mouth every 6 (six) hours as needed for headache. 20 tablet 0  . HYDROcodone-acetaminophen (NORCO) 5-325 MG tablet Take 1 tablet by mouth every 6 (six) hours as needed for moderate pain. 10 tablet 0  . ibuprofen (ADVIL,MOTRIN) 200 MG tablet Take 200 mg by mouth as needed for pain. Reported on 11/23/2015    . levonorgestrel (MIRENA) 20 MCG/24HR IUD 1 each by Intrauterine route once.    Marland Kitchen  metoprolol tartrate (LOPRESSOR) 25 MG tablet Take 1 tablet (25 mg total) by mouth 2 (two) times daily. 1 tablet po BID for blood pressure 60 tablet 5  . Multiple Vitamin (MULTIVITAMIN WITH MINERALS) TABS Take 1 tablet by mouth daily.    . predniSONE (STERAPRED UNI-PAK 21 TAB) 10 MG (21) TBPK tablet Take 1 tablet (10 mg total) by mouth daily. 6/5/4/3/2/1 taper 21 tablet 0   No current facility-administered medications on file prior to visit.    Family History  Problem Relation Age of Onset  . Diabetes Father   . Hypertension Father   . Cancer Father     colon cancer  . Kidney Stones Father   . Heart disease Mother 36    died suddenly of MI  . Heart disease Maternal Uncle 27    died of MI  .  Heart disease Maternal Uncle   . Breast cancer Maternal Grandmother    ROS as in subjective   Objective: BP 118/72   Pulse 87   SpO2 99%   BP Readings from Last 3 Encounters:  06/29/16 118/72  06/17/16 124/80  06/10/16 120/82   Wt Readings from Last 3 Encounters:  05/17/16 180 lb (81.6 kg)  06/16/15 171 lb (77.6 kg)  05/28/15 168 lb (76.2 kg)   General appearance: alert, no distress, WD/WN HEENT: normocephalic, sclerae anicteric, PERRLA, EOMi, nares patent, no discharge or erythema, pharynx normal Oral cavity: MMM, no lesions Neck: supple, no lymphadenopathy, no thyromegaly, no masses, no bruits Heart: RRR, normal S1, S2, no murmurs Lungs: CTA bilaterally, no wheezes, rhonchi, or rales Extremities: no edema, no cyanosis, no clubbing Pulses: 2+ symmetric, upper and lower extremities, normal cap refill Neurological: +she seems to sway backwards with romberg, and is off balance with heel to toe.  Otherwise alert, oriented x 3, CN2-12 intact, strength normal upper extremities and lower extremities, sensation normal throughout, DTRs 2+ throughout, gait normal Psychiatric: normal affect, behavior normal, pleasant  Skin: no rash   Assessment: Encounter Diagnoses  Name Primary?  . Worsening headaches Yes  . Right arm numbness   . Rash and nonspecific skin eruption   . Nausea without vomiting      Plan: Headaches - has seen some improvement with Hydrocodone, but not much improvement with Axert, Relpax, prednisone, Fioricet or change to metoprolol from HCTZ.   Will go ahead and pursue MRI brain.  Her neurology appt is next week.  Labs today.  Begin trial of Amoxicillin in the event this could be sinus related.  Recheck if worse in the meantime.   Nykiah was seen today for headaches.  Diagnoses and all orders for this visit:  Worsening headaches -     Basic metabolic panel -     CBC -     Lyme Aby, Western Blot IgG & IgM w/bands -     CMV IgM -     Epstein-Barr virus VCA,  IgM -     Sedimentation rate -     MR Brain Wo Contrast; Future -     TSH -     T4, free  Right arm numbness -     Basic metabolic panel -     CBC -     Lyme Aby, Western Blot IgG & IgM w/bands -     CMV IgM -     Epstein-Barr virus VCA, IgM -     MR Brain Wo Contrast; Future -     TSH -  T4, free  Rash and nonspecific skin eruption -     Basic metabolic panel -     CBC -     Lyme Aby, Western Blot IgG & IgM w/bands -     CMV IgM -     Epstein-Barr virus VCA, IgM -     MR Brain Wo Contrast; Future -     TSH -     T4, free  Nausea without vomiting -     Basic metabolic panel -     CBC -     Lyme Aby, Western Blot IgG & IgM w/bands -     CMV IgM -     Epstein-Barr virus VCA, IgM -     MR Brain Wo Contrast; Future -     TSH -     T4, free  Other orders -     amoxicillin (AMOXIL) 500 MG tablet; 2 tablets po BID x 10 days -     oxyCODONE-acetaminophen (PERCOCET) 7.5-325 MG tablet; Take 1 tablet by mouth every 4 (four) hours as needed for severe pain.

## 2016-06-29 NOTE — Telephone Encounter (Signed)
Have Sandy add TSH and Free t4 if we got enough blood

## 2016-06-29 NOTE — Telephone Encounter (Signed)
Lets work on scheduled and Government social research officer for MRI brain.  Try and get the appt sometime next week if possible.

## 2016-06-30 LAB — EPSTEIN-BARR VIRUS VCA, IGM: EBV VCA IgM: <36 U/mL

## 2016-06-30 LAB — SEDIMENTATION RATE: Sed Rate: 3 mm/hr (ref 0–20)

## 2016-06-30 NOTE — Telephone Encounter (Signed)

## 2016-06-30 NOTE — Telephone Encounter (Signed)
Sandy add this to her blood work

## 2016-07-01 LAB — CMV IGM: CMV IgM: <30 AU/mL (ref <30.00)

## 2016-07-01 LAB — LYME ABY, WSTRN BLT IGG & IGM W/BANDS
B burgdorferi IgG Abs (IB): NEGATIVE
B burgdorferi IgM Abs (IB): NEGATIVE
Lyme Disease 18 kD IgG: NONREACTIVE
Lyme Disease 23 kD IgG: NONREACTIVE
Lyme Disease 23 kD IgM: REACTIVE — AB
Lyme Disease 28 kD IgG: NONREACTIVE
Lyme Disease 30 kD IgG: NONREACTIVE
Lyme Disease 39 kD IgG: NONREACTIVE
Lyme Disease 39 kD IgM: NONREACTIVE
Lyme Disease 41 kD IgG: NONREACTIVE
Lyme Disease 41 kD IgM: NONREACTIVE
Lyme Disease 45 kD IgG: NONREACTIVE
Lyme Disease 58 kD IgG: NONREACTIVE
Lyme Disease 66 kD IgG: NONREACTIVE
Lyme Disease 93 kD IgG: NONREACTIVE

## 2016-07-01 LAB — T4, FREE: Free T4: 1.3 ng/dL (ref 0.8–1.8)

## 2016-07-01 LAB — TSH: TSH: 1.8 mIU/L

## 2016-07-01 NOTE — Telephone Encounter (Signed)
Not sure why the no show thing was sent to me?    See message below about setting up MRI

## 2016-07-01 NOTE — Telephone Encounter (Signed)
No show must of been hit by accident- disregard no show comment. Pt is scheduled for MRI- 11/16 at [facility].  No authorization is needed per [facility] with umr  Ref # [id number]  Mri -brain [phone]

## 2016-07-04 ENCOUNTER — Other Ambulatory Visit: Payer: Self-pay | Admitting: Medical

## 2016-07-04 MED ORDER — FLUCONAZOLE 150 MG PO TABS: ORAL_TABLET | ORAL | 1 refills | Status: DC

## 2016-07-04 MED FILL — FLUCONAZOLE 150 MG TABLET: 150 | 7 days supply | Qty: 2 | Fill #0

## 2016-07-05 ENCOUNTER — Telehealth: Payer: Self-pay | Admitting: Diagnostic Neuroimaging

## 2016-07-05 ENCOUNTER — Ambulatory Visit (INDEPENDENT_AMBULATORY_CARE_PROVIDER_SITE_OTHER): Payer: 59 | Admitting: Diagnostic Neuroimaging

## 2016-07-05 ENCOUNTER — Encounter: Payer: Self-pay | Admitting: Diagnostic Neuroimaging

## 2016-07-05 ENCOUNTER — Telehealth: Payer: Self-pay | Admitting: *Deleted

## 2016-07-05 VITALS — BP 125/83 | HR 82 | Ht 64.0 in | Wt 182.6 lb

## 2016-07-05 DIAGNOSIS — Z6831 Body mass index (BMI) 31.0-31.9, adult: Secondary | ICD-10-CM

## 2016-07-05 DIAGNOSIS — G4719 Other hypersomnia: Secondary | ICD-10-CM | POA: Diagnosis not present

## 2016-07-05 DIAGNOSIS — G458 Other transient cerebral ischemic attacks and related syndromes: Secondary | ICD-10-CM

## 2016-07-05 DIAGNOSIS — G43109 Migraine with aura, not intractable, without status migrainosus: Secondary | ICD-10-CM | POA: Diagnosis not present

## 2016-07-05 DIAGNOSIS — R0683 Snoring: Secondary | ICD-10-CM | POA: Diagnosis not present

## 2016-07-05 MED ORDER — ALPRAZOLAM 0.5 MG PO TABS: 0.5000 mg | ORAL_TABLET | ORAL | 0 refills | Status: DC | PRN

## 2016-07-05 MED ORDER — TOPIRAMATE 50 MG PO TABS: 50.0000 mg | ORAL_TABLET | Freq: Two times a day (BID) | ORAL | 12 refills | Status: DC

## 2016-07-05 MED ORDER — RIZATRIPTAN BENZOATE 10 MG PO TBDP: 10.0000 mg | ORAL_TABLET | ORAL | 11 refills | Status: DC | PRN

## 2016-07-05 MED ORDER — DICLOFENAC POTASSIUM(MIGRAINE) 50 MG PO PACK: 50.0000 mg | PACK | ORAL | 3 refills | Status: DC | PRN

## 2016-07-05 MED FILL — ALPRAZolam 0.5 MG TABS: 0.5 | 1 days supply | Qty: 3 | Fill #0

## 2016-07-05 MED FILL — TOPIRAMATE 50 MG TABLET: 50 | 30 days supply | Qty: 60 | Fill #0

## 2016-07-05 MED FILL — CAMBIA 50 MG POWDER PACKET: 50 | 30 days supply | Qty: 9 | Fill #0

## 2016-07-05 MED FILL — RIZATRIPTAN 10 MG ODT: 10 | 30 days supply | Qty: 9 | Fill #0

## 2016-07-05 NOTE — Telephone Encounter (Signed)
Spoke with patient and informed her Kelly [name] infusion RN has appointment available today at 3 pm. Advised she check in at front desk. She verbalized understanding, appreciation.

## 2016-07-05 NOTE — Progress Notes (Signed)
[facility]  PATIENT: Kelly [name] DOB: 1966/02/09  REFERRING CLINICIAN: Tysinger HISTORY FROM: patient REASON FOR VISIT: new consult   HISTORICAL  CHIEF COMPLAINT:  Chief Complaint  Patient presents with  . Headache    rm 6, New Pt, "headache every day > 1 month; do not wake with it, comes on mid morning; Percocet helps"    HISTORY OF PRESENT ILLNESS:   50 year old female here for evaluation of headaches. Patient has history of hypertension, hypercholesterolemia, migraine.  Since age 63 years old patient has had intense intermittent severe headaches with nausea, vomiting, seeing spots and sparkles. These occurred infrequently and were well managed with Axert rescue medication. Patient tried topiramate in the past. For many years headaches have been very well controlled.  In past 1 month patient has had a change in her headaches now almost daily headaches, typically worse later in the day. She has had some associated right arm and left leg numbness, lip numbness, trouble speaking with some of her headaches. Patient feels headache in her right frontal region radiating into her right posterior neck region. Symptoms worse with coughing, sneezing, leaning forward. Patient having photophobia, phonophobia and smell sensitivity. Now over-the-counter medication, Axert, Percocet are not helping her headaches. She has tried metoprolol for the last few weeks to help with blood pressure and headache prevention but no benefit yet.   REVIEW OF SYSTEMS: Full 14 system review of systems performed and negative with exception of: Fatigue eye pain snoring headache dizziness.   ALLERGIES: Allergies  Allergen Reactions  . Imitrex [Sumatriptan Base] Other (See Comments)    Esophageal discomfort most likely serotonin effect  . Zomig Other (See Comments)    Throat discomfort, most likely serotonin effect   . Avelox [Moxifloxacin Hcl In Nacl] Hives  . Morphine Nausea And  Vomiting  . Septra [Bactrim] Hives    HOME MEDICATIONS: Outpatient Medications Prior to Visit  Medication Sig Dispense Refill  . almotriptan (AXERT) 12.5 MG tablet Take 1 tablet (12.5 mg total) by mouth as needed for migraine. may repeat in 2 hours if needed 10 tablet 0  . amoxicillin (AMOXIL) 500 MG tablet 2 tablets po BID x 10 days 40 tablet 0  . atorvastatin (LIPITOR) 20 MG tablet Take 1 tablet (20 mg total) by mouth at bedtime. 90 tablet 4  . Azelastine-Fluticasone (DYMISTA) 137-50 MCG/ACT SUSP Place 1 spray into the nose 2 (two) times daily. 1 Bottle 0  . butalbital-acetaminophen-caffeine (FIORICET, ESGIC) 50-325-40 MG tablet Take 1-2 tablets by mouth every 6 (six) hours as needed for headache. 20 tablet 0  . fluconazole (DIFLUCAN) 150 MG tablet 1 tablet now, may repeat in 1 week prn 2 tablet 1  . ibuprofen (ADVIL,MOTRIN) 200 MG tablet Take 200 mg by mouth as needed for pain. Reported on 11/23/2015    . levonorgestrel (MIRENA) 20 MCG/24HR IUD 1 each by Intrauterine route once.    . metoprolol tartrate (LOPRESSOR) 25 MG tablet Take 1 tablet (25 mg total) by mouth 2 (two) times daily. 1 tablet po BID for blood pressure 60 tablet 5  . Multiple Vitamin (MULTIVITAMIN WITH MINERALS) TABS Take 1 tablet by mouth daily.    Marland Kitchen oxyCODONE-acetaminophen (PERCOCET) 7.5-325 MG tablet Take 1 tablet by mouth every 4 (four) hours as needed for severe pain. 15 tablet 0  . HYDROcodone-acetaminophen (NORCO) 5-325 MG tablet Take 1 tablet by mouth every 6 (six) hours as needed for moderate pain. 10 tablet 0  . predniSONE (STERAPRED UNI-PAK 21 TAB) 10  MG (21) TBPK tablet Take 1 tablet (10 mg total) by mouth daily. 6/5/4/3/2/1 taper 21 tablet 0   No facility-administered medications prior to visit.     PAST MEDICAL HISTORY: Past Medical History:  Diagnosis Date  . Anticardiolipin antibody positive   . HSV-2 (herpes simplex virus 2) infection   . Hyperlipidemia   . Hypertension    diet controlled  . Migraine    . Renal calculus, right     PAST SURGICAL HISTORY: Past Surgical History:  Procedure Laterality Date  . CESAREAN SECTION     X3   LAST ONE 11-30-2004  . CYSTOSCOPY W/ RETROGRADES Right 10/29/2012   Procedure: CYSTOSCOPY WITH RETROGRADE PYELOGRAM and stent placement;  Surgeon: Reece Packer, MD;  Location: [place];  Service: Urology;  Laterality: Right;  rt stent placement , rt retrograde and cysto   . CYSTOSCOPY W/ URETERAL STENT PLACEMENT Right 11/23/2012   Procedure: CYSTOSCOPY WITH STENT REPLACEMENT;  Surgeon: Alexis Frock, MD;  [facility];  Service: Urology;  Laterality: Right;  . CYSTOSCOPY/RETROGRADE/URETEROSCOPY/STONE EXTRACTION WITH BASKET Right 11/23/2012   Procedure: CYSTOSCOPY/RETROGRADE/URETEROSCOPY/STONE EXTRACTION WITH BASKET;  Surgeon: Alexis Frock, MD;  Location: [facility];  Service: Urology;  Laterality: Right;    FAMILY HISTORY: Family History  Problem Relation Age of Onset  . Diabetes Father   . Hypertension Father   . Cancer Father     colon cancer  . Kidney Stones Father   . Heart disease Mother 20    died suddenly of MI  . Heart attack Mother   . Diabetes Sister   . Heart disease Maternal Uncle 35    died of MI  . Heart disease Maternal Uncle   . Breast cancer Maternal Grandmother     SOCIAL HISTORY:  Social History   Social History  . Marital status: Single    Spouse name: N/A  . Number of children: 3  . Years of education: 13   Occupational History  .      [employer], pt advocate   Social History Main Topics  . Smoking status: Current Every Day Smoker    Packs/day: 0.50    Years: 8.00    Types: Cigarettes  . Smokeless tobacco: Never Used  . Alcohol use No  . Drug use: No  . Sexual activity: Yes    Birth control/ protection: IUD   Other Topics Concern  . Not on file   Social History Narrative  . No narrative on file     PHYSICAL EXAM  GENERAL  EXAM/CONSTITUTIONAL: Vitals:  Vitals:   07/05/16 0833  BP: 125/83  Pulse: 82  Weight: 182 lb 9.6 oz (82.8 kg)  Height: 5\' 4"  (1.626 m)     Body mass index is 31.34 kg/m.  Visual Acuity Screening   Right eye Left eye Both eyes  Without correction:     With correction: 20/30 20/30      Patient is in no distress; well developed, nourished and groomed; neck is supple  CARDIOVASCULAR:  Examination of carotid arteries is normal; no carotid bruits  Regular rate and rhythm, no murmurs  Examination of peripheral vascular system by observation and palpation is normal  EYES:  Ophthalmoscopic exam of optic discs and posterior segments is normal; no papilledema or hemorrhages  MUSCULOSKELETAL:  Gait, strength, tone, movements noted in Neurologic exam below  NEUROLOGIC: MENTAL STATUS:  No flowsheet data found.  awake, alert, oriented to person, place and time  recent  and remote memory intact  normal attention and concentration  language fluent, comprehension intact, naming intact,   fund of knowledge appropriate  CRANIAL NERVE:   2nd - no papilledema on fundoscopic exam  2nd, 3rd, 4th, 6th - pupils equal and reactive to light, visual fields full to confrontation, extraocular muscles intact, no nystagmus  5th - facial sensation symmetric  7th - facial strength symmetric  8th - hearing intact  9th - palate elevates symmetrically, uvula midline  11th - shoulder shrug symmetric  12th - tongue protrusion midline  MOTOR:   normal bulk and tone, full strength in the BUE, BLE  SENSORY:   normal and symmetric to light touch, temperature, vibration  COORDINATION:   finger-nose-finger, fine finger movements normal  REFLEXES:   deep tendon reflexes present and symmetric  GAIT/STATION:   narrow based gait; able to walk  tandem; romberg is negative    DIAGNOSTIC DATA (LABS, IMAGING, TESTING) - I reviewed patient records, labs, notes, testing and  imaging myself where available.  Lab Results  Component Value Date   WBC 13.0 (H) 06/29/2016   HGB 13.6 06/29/2016   HCT 39.5 06/29/2016   MCV 89.8 06/29/2016   PLT 357 06/29/2016      Component Value Date/Time   NA 140 06/29/2016 0937   K 3.9 06/29/2016 0937   CL 103 06/29/2016 0937   CO2 29 06/29/2016 0937   GLUCOSE 78 06/29/2016 0937   BUN 16 06/29/2016 0937   CREATININE 0.67 06/29/2016 0937   CALCIUM 9.1 06/29/2016 0937   PROT 6.4 05/17/2016 1442   ALBUMIN 4.0 05/17/2016 1442   AST 17 05/17/2016 1442   ALT 15 05/17/2016 1442   ALKPHOS 76 05/17/2016 1442   BILITOT 0.5 05/17/2016 1442   GFRNONAA >60 01/11/2015 1749   GFRAA >60 01/11/2015 1749   Lab Results  Component Value Date   CHOL 210 (H) 05/17/2016   HDL 33 (L) 05/17/2016   LDLCALC NOT CALC 05/17/2016   TRIG 467 (H) 05/17/2016   CHOLHDL 6.4 (H) 05/17/2016   No results found for: HGBA1C No results found for: VITAMINB12 Lab Results  Component Value Date   TSH 1.80 06/29/2016    05/03/09 CT head [I reviewed images myself and agree with interpretation. -VRP]  - no acute findings     ASSESSMENT AND PLAN  50 y.o. year old female here with history of migraine headaches since age 53 years old, now with 1 month of severe daily headaches with change in severity and quality. Also with focal neurologic symptoms. May represent transformed migraine versus TIA. We'll check MRI of the brain to rule out secondary causes of headache and neurologic symptoms.   Ddx: complicated migraine, transformed migraine, TIA, CNS inflamm/autoimmune  1. Migraine with aura and without status migrainosus, not intractable   2. Other specified transient cerebral ischemias      PLAN: - TIA/stroke workup - sleep study - medical migraine treatment - patient to return later today for migraine infusion  Orders Placed This Encounter  Procedures  . US Carotid Bilateral  . MR BRAIN WO CONTRAST  . Ambulatory referral to Sleep Studies    . ECHOCARDIOGRAM COMPLETE   Meds ordered this encounter  Medications  . topiramate (TOPAMAX) 50 MG tablet    Sig: Take 1 tablet (50 mg total) by mouth 2 (two) times daily.    Dispense:  60 tablet    Refill:  12  . rizatriptan (MAXALT-MLT) 10 MG disintegrating tablet    Sig: Take  1 tablet (10 mg total) by mouth as needed for migraine. May repeat in 2 hours if needed    Dispense:  9 tablet    Refill:  11  . ALPRAZolam (XANAX) 0.5 MG tablet    Sig: Take 1 tablet (0.5 mg total) by mouth as needed for anxiety (for sedation before MRI scan; take 1 hour before scan; may repeat 15 min before scan).    Dispense:  3 tablet    Refill:  0  . Diclofenac Potassium (CAMBIA) 50 MG PACK    Sig: Take 50 mg by mouth as needed (for migraine; max 1 packet per day).    Dispense:  9 each    Refill:  3   Return in about 6 weeks (around 08/16/2016).  I reviewed images, labs, notes, records myself. I summarized findings and reviewed with patient, for this high risk condition (new onset headache, possible TIA) requiring high complexity decision making.     Penni Bombard, MD [id number], 0000000 AM Certified in Neurology, Neurophysiology and Neuroimaging  [facility] [address] [place], [place] [address] [phone]

## 2016-07-05 NOTE — Telephone Encounter (Signed)
[facility] Patient ready to be scheduled. NO PA needed. Thanks Hinton Dyer

## 2016-07-05 NOTE — Patient Instructions (Signed)
Thank you for coming to see [place] at [facility]. I hope we have been able to provide you high quality care today.  You may receive a patient satisfaction survey over the next few weeks. We would appreciate your feedback and comments so that we may continue to improve ourselves and the health of our patients.  - I will check MRI brain, echocardiogram, carotid ultrasound, sleep study  - start topiramate 65m at bedtime; after 1 week increase to twice a day; drink plenty of water  - rizatriptan 11mas needed for breakthrough headache; may repeat x 1 after 2 hours; max 2 tabs per day or 8 per month  - cambia 5088macket as needed for breakthrough migraine; 1 packet per day maximum  - To prevent or relieve headaches, try the following:   Cool Compress. Lie down and place a cool compress on your head.   Avoid headache triggers. If certain foods or odors seem to have triggered your migraines in the past, avoid them. A headache diary might help you identify triggers.   Include physical activity in your daily routine.   Manage stress. Find healthy ways to cope with the stressors, such as delegating tasks on your to-do list.   Practice relaxation techniques. Try deep breathing, yoga, massage and visualization.   Eat regularly. Eating regularly scheduled meals and maintaining a healthy diet might help prevent headaches. Also, drink plenty of fluids.   Follow a regular sleep schedule. Sleep deprivation might contribute to headaches  Consider biofeedback. With this mind-body technique, you learn to control certain bodily functions - such as muscle tension, heart rate and blood pressure - to prevent headaches or reduce headache pain.   ~~~~~~~~~~~~~~~~~~~~~~~~~~~~~~~~~~~~~~~~~~~~~~~~~~~~~~~~~~~~~~~~~  DR. Thales Knipple'S GUIDE TO HAPPY AND HEALTHY LIVING These are some of my general health and wellness recommendations. Some of them may apply to you better than others. Please  use common sense as you try these suggestions and feel free to ask me any questions.   ACTIVITY/FITNESS Mental, social, emotional and physical stimulation are very important for brain and body health. Try learning a new activity (arts, music, language, sports, games).  Keep moving your body to the best of your abilities. You can do this at home, inside or outside, the park, community center, gym or anywhere you like. Consider a physical therapist or personal trainer to get started. Consider the app Sworkit. Fitness trackers such as smart-watches, smart-phones or Fitbits can help as well.   NUTRITION Eat more plants: colorful vegetables, nuts, seeds and berries.  Eat less sugar, salt, preservatives and processed foods.  Avoid toxins such as cigarettes and alcohol.  Drink water when you are thirsty. Warm water with a slice of lemon is an excellent morning drink to start the day.  Consider these websites for more information The Nutrition Source (htthttps://www.henry-hernandez.biz/recision Nutrition (wwwWindowBlog.ch RELAXATION Consider practicing mindfulness meditation or other relaxation techniques such as deep breathing, prayer, yoga, tai chi, massage. See website mindful.org or the apps Headspace or Calm to help get started.   SLEEP Try to get at least 7-8+ hours sleep per day. Regular exercise and reduced caffeine will help you sleep better. Practice good sleep hygeine techniques. See website sleep.org for more information.   PLANNING Prepare estate planning, living will, healthcare POA documents. Sometimes this is best planned with the help of an attorney. Theconversationproject.org and agingwithdignity.org are excellent resources.

## 2016-07-06 ENCOUNTER — Ambulatory Visit (INDEPENDENT_AMBULATORY_CARE_PROVIDER_SITE_OTHER): Payer: 59

## 2016-07-06 DIAGNOSIS — G458 Other transient cerebral ischemic attacks and related syndromes: Secondary | ICD-10-CM

## 2016-07-06 DIAGNOSIS — R0683 Snoring: Secondary | ICD-10-CM | POA: Diagnosis not present

## 2016-07-06 DIAGNOSIS — G4719 Other hypersomnia: Secondary | ICD-10-CM

## 2016-07-06 DIAGNOSIS — G43109 Migraine with aura, not intractable, without status migrainosus: Secondary | ICD-10-CM

## 2016-07-07 ENCOUNTER — Ambulatory Visit (HOSPITAL_COMMUNITY): Payer: 59

## 2016-07-07 ENCOUNTER — Telehealth: Payer: Self-pay | Admitting: Diagnostic Neuroimaging

## 2016-07-07 NOTE — Telephone Encounter (Signed)
Patient called to request results of Doppler and MRI, please call [phone].

## 2016-07-07 NOTE — Telephone Encounter (Signed)
Spoke with patient and advised her that the results of doppler and MRI are not available this soon. Both were done yesterday afternoon. Informed her that if anything immediately concerning were seen by the technologist, radiologist, thye would have contacted to ordering physician right away. Informed her that she should get a call by early next week with results.  She verbalized understanding, appreciation for call.

## 2016-07-08 NOTE — Telephone Encounter (Signed)
I called patient with MRI results. Normal. -VRP

## 2016-07-18 ENCOUNTER — Encounter: Payer: Self-pay | Admitting: Diagnostic Neuroimaging

## 2016-07-20 ENCOUNTER — Encounter: Payer: Self-pay | Admitting: Diagnostic Neuroimaging

## 2016-07-28 ENCOUNTER — Encounter: Payer: Self-pay | Admitting: Neurology

## 2016-07-28 ENCOUNTER — Ambulatory Visit (INDEPENDENT_AMBULATORY_CARE_PROVIDER_SITE_OTHER): Payer: 59 | Admitting: Neurology

## 2016-07-28 VITALS — BP 150/81 | HR 85 | Resp 20 | Ht 64.0 in | Wt 182.0 lb

## 2016-07-28 DIAGNOSIS — I10 Essential (primary) hypertension: Secondary | ICD-10-CM

## 2016-07-28 DIAGNOSIS — R519 Headache, unspecified: Secondary | ICD-10-CM

## 2016-07-28 DIAGNOSIS — R0683 Snoring: Secondary | ICD-10-CM | POA: Diagnosis not present

## 2016-07-28 DIAGNOSIS — R51 Headache: Secondary | ICD-10-CM | POA: Diagnosis not present

## 2016-07-28 DIAGNOSIS — F17200 Nicotine dependence, unspecified, uncomplicated: Secondary | ICD-10-CM | POA: Diagnosis not present

## 2016-07-28 DIAGNOSIS — G43109 Migraine with aura, not intractable, without status migrainosus: Secondary | ICD-10-CM

## 2016-07-28 DIAGNOSIS — G4719 Other hypersomnia: Secondary | ICD-10-CM

## 2016-07-28 DIAGNOSIS — E669 Obesity, unspecified: Secondary | ICD-10-CM | POA: Diagnosis not present

## 2016-07-28 MED FILL — TOPIRAMATE 50 MG TABLET: 50 | 30 days supply | Qty: 60 | Fill #1

## 2016-07-28 MED FILL — CAMBIA 50 MG POWDER PACKET: 50 | 30 days supply | Qty: 9 | Fill #1

## 2016-07-28 MED FILL — RIZATRIPTAN 10 MG ODT: 10 | 30 days supply | Qty: 9 | Fill #1

## 2016-07-28 NOTE — Patient Instructions (Signed)

## 2016-07-28 NOTE — Progress Notes (Signed)
Subjective:    Patient ID: Kelly [name] is a 50 y.o. female.  HPI     Star Age, MD, PhD [facility] [address], [address]O. [place], [place] [address]  Dear Bonnita Levan,   I saw your patient, Kelly [name], upon your kind request in my clinic today for initial consultation of her sleep disorder, in particular, concern for underlying obstructive sleep apnea. The patient is unaccompanied today. As you know, Kelly [name] is a 50 year old right-handed woman with an underlying medical history of positive anticardiolipin antibody state, hyperlipidemia, hypertension, kidney stones, migraine headaches and obesity, who reports snoring and excessive daytime somnolence. I reviewed your office note from 07/05/2016. She reports worsening headaches for the past 6 weeks. Per family, her snoring has become louder recently as well. She goes to sleep fairly easily but has trouble maintaining sleep and has often woken up before her alarm in the morning. She used to have infrequent migraine headaches. She used to have visual auras. Her headache currently is typically in the back of her head or in the right frontal and retro-orbital orbital areas. 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 She has had no recent change in stress. She is a single mother of 3 and also has a foster son. She lives at home with her 3 biological children and one foster son. Her 33 year old daughter sleeps in her bed with her typically. Bedtime is around midnight and wake up time between 6:30 and 7. Her Epworth sleepiness score is 8 out of 24, heart fatigue score is 48 out of 63. She smokes half a pack per day. She does not drink alcohol or use illicit drugs, she drinks caffeine in the form of sweet tea, 2 glasses per day but drinks a lot of water she states. She has gone to bed with headaches but does not typically wake up with severe headaches. She has a sister with obstructive sleep apnea. She denies  significant restless leg symptoms or leg twitching at night. She denies parasomnias. She has gained weight in the last year in the realm of 10 pounds. Recent workup includes a brain MRI without contrast on 07/06/2016 which was reported as normal. She also had a carotid ultrasound, results pending. She has had increased blood pressure values and was started on blood pressure medication less than 6 months ago.  Her Past Medical History Is Significant For: Past Medical History:  Diagnosis Date  . Anticardiolipin antibody positive   . HSV-2 (herpes simplex virus 2) infection   . Hyperlipidemia   . Hypertension    diet controlled  . Migraine   . Renal calculus, right     Her Past Surgical History Is Significant For: Past Surgical History:  Procedure Laterality Date  . CESAREAN SECTION     X3   LAST ONE 11-30-2004  . CYSTOSCOPY W/ RETROGRADES Right 10/29/2012   Procedure: CYSTOSCOPY WITH RETROGRADE PYELOGRAM and stent placement;  Surgeon: Reece Packer, MD;  Location: [facility];  Service: Urology;  Laterality: Right;  rt stent placement , rt retrograde and cysto   . CYSTOSCOPY W/ URETERAL STENT PLACEMENT Right 11/23/2012   Procedure: CYSTOSCOPY WITH STENT REPLACEMENT;  Surgeon: Alexis Frock, MD;  Location: [facility];  Service: Urology;  Laterality: Right;  . CYSTOSCOPY/RETROGRADE/URETEROSCOPY/STONE EXTRACTION WITH BASKET Right 11/23/2012   Procedure: CYSTOSCOPY/RETROGRADE/URETEROSCOPY/STONE EXTRACTION WITH BASKET;  Surgeon: Alexis Frock, MD;  Location: [facility];  Service: Urology;  Laterality: Right;  Her Family History Is Significant For: Family History  Problem Relation Age of Onset  . Diabetes Father   . Hypertension Father   . Cancer Father     colon cancer  . Kidney Stones Father   . Heart disease Mother 25    died suddenly of MI  . Heart attack Mother   . Diabetes Sister   . Heart disease Maternal Uncle 37    died of  MI  . Heart disease Maternal Uncle   . Breast cancer Maternal Grandmother     Her Social History Is Significant For: Social History   Social History  . Marital status: Single    Spouse name: N/A  . Number of children: 3  . Years of education: 44   Occupational History  .      [employer], pt advocate   Social History Main Topics  . Smoking status: Current Every Day Smoker    Packs/day: 0.50    Years: 8.00    Types: Cigarettes  . Smokeless tobacco: Never Used  . Alcohol use No  . Drug use: No  . Sexual activity: Yes    Birth control/ protection: IUD   Other Topics Concern  . None   Social History Narrative  . None    Her Allergies Are:  Allergies  Allergen Reactions  . Imitrex [Sumatriptan Base] Other (See Comments)    Esophageal discomfort most likely serotonin effect  . Zomig Other (See Comments)    Throat discomfort, most likely serotonin effect   . Avelox [Moxifloxacin Hcl In Nacl] Hives  . Morphine Nausea And Vomiting  . Septra [Bactrim] Hives  :   Her Current Medications Are:  Outpatient Encounter Prescriptions as of 07/28/2016  Medication Sig  . almotriptan (AXERT) 12.5 MG tablet Take 1 tablet (12.5 mg total) by mouth as needed for migraine. may repeat in 2 hours if needed  . atorvastatin (LIPITOR) 20 MG tablet Take 1 tablet (20 mg total) by mouth at bedtime.  . Azelastine-Fluticasone (DYMISTA) 137-50 MCG/ACT SUSP Place 1 spray into the nose 2 (two) times daily.  . Diclofenac Potassium (CAMBIA) 50 MG PACK Take 50 mg by mouth as needed (for migraine; max 1 packet per day).  Marland Kitchen ibuprofen (ADVIL,MOTRIN) 200 MG tablet Take 200 mg by mouth as needed for pain. Reported on 11/23/2015  . levonorgestrel (MIRENA) 20 MCG/24HR IUD 1 each by Intrauterine route once.  . metoprolol tartrate (LOPRESSOR) 25 MG tablet Take 1 tablet (25 mg total) by mouth 2 (two) times daily. 1 tablet po BID for blood pressure  . Multiple Vitamin (MULTIVITAMIN WITH MINERALS) TABS Take 1  tablet by mouth daily.  Marland Kitchen oxyCODONE-acetaminophen (PERCOCET) 7.5-325 MG tablet Take 1 tablet by mouth every 4 (four) hours as needed for severe pain.  . rizatriptan (MAXALT-MLT) 10 MG disintegrating tablet Take 1 tablet (10 mg total) by mouth as needed for migraine. May repeat in 2 hours if needed  . topiramate (TOPAMAX) 50 MG tablet Take 1 tablet (50 mg total) by mouth 2 (two) times daily.  . [DISCONTINUED] ALPRAZolam (XANAX) 0.5 MG tablet Take 1 tablet (0.5 mg total) by mouth as needed for anxiety (for sedation before MRI scan; take 1 hour before scan; may repeat 15 min before scan).  . [DISCONTINUED] amoxicillin (AMOXIL) 500 MG tablet 2 tablets po BID x 10 days  . [DISCONTINUED] butalbital-acetaminophen-caffeine (FIORICET, ESGIC) 50-325-40 MG tablet Take 1-2 tablets by mouth every 6 (six) hours as needed for headache.  . [DISCONTINUED] fluconazole (DIFLUCAN)  150 MG tablet 1 tablet now, may repeat in 1 week prn   No facility-administered encounter medications on file as of 07/28/2016.   :  Review of Systems:  Out of a complete 14 point review of systems, all are reviewed and negative with the exception of these symptoms as listed below:  Review of Systems  Neurological: Positive for dizziness, speech difficulty, numbness and headaches.       Pt presents today to discuss the possibility of having a sleep study. Pt has never had a sleep study but does report snoring.  Epworth Sleepiness Scale 0= would never doze 1= slight chance of dozing 2= moderate chance of dozing 3= high chance of dozing  Sitting and reading: 3 Watching TV: 2 Sitting inactive in a public place (ex. Theater or meeting): 1 As a passenger in a car for an hour without a break: 1 Lying down to rest in the afternoon: 1 Sitting and talking to someone: 0 Sitting quietly after lunch (no alcohol): 0 In a car, while stopped in traffic: 0 Total: 8     Objective:  Neurologic Exam  Physical Exam Physical Examination:    Vitals:   07/28/16 1545  BP: (!) 150/81  Pulse: 85  Resp: 20    General Examination: The patient is a very pleasant 50 y.o. female in no acute distress. She appears well-developed and well-nourished and well groomed.   HEENT: Normocephalic, atraumatic, pupils are equal, round and reactive to light and accommodation. Funduscopic exam is normal with sharp disc margins noted. Extraocular tracking is good without limitation to gaze excursion or nystagmus noted. Normal smooth pursuit is noted. Hearing is grossly intact. Face is symmetric with normal facial animation and normal facial sensation. Speech is clear with no dysarthria noted. There is no hypophonia. There is no lip, neck/head, jaw or voice tremor. Neck is supple with full range of passive and active motion. There are no carotid bruits on auscultation. Oropharynx exam reveals: mild mouth dryness, good dental hygiene and mild airway crowding, due to smaller airway entry and redundant soft palate. Tonsils are small bilaterally, uvula is not enlarged appearing. Mallampati is class II. Neck circumference is 15-1/8 inches. Tongue protrudes centrally and palate elevates symmetrically.   Chest: Clear to auscultation without wheezing, rhonchi or crackles noted.  Heart: S1+S2+0, regular and normal without murmurs, rubs or gallops noted.   Abdomen: Soft, non-tender and non-distended with normal bowel sounds appreciated on auscultation.  Extremities: There is no pitting edema in the distal lower extremities bilaterally. Pedal pulses are intact.  Skin: Warm and dry without trophic changes noted.  Musculoskeletal: exam reveals no obvious joint deformities, tenderness or joint swelling or erythema.   Neurologically:  Mental status: The patient is awake, alert and oriented in all 4 spheres. Her immediate and remote memory, attention, language skills and fund of knowledge are appropriate. There is no evidence of aphasia, agnosia, apraxia or anomia.  Speech is clear with normal prosody and enunciation. Thought process is linear. Mood is normal and affect is normal.  Cranial nerves II - XII are as described above under HEENT exam. In addition: shoulder shrug is normal with equal shoulder height noted. Motor exam: Normal bulk, strength and tone is noted. There is no drift, tremor or rebound. Romberg is negative. Reflexes are 2+ throughout. Fine motor skills and coordination: intact with normal finger taps, normal hand movements, normal rapid alternating patting, normal foot taps and normal foot agility.  Cerebellar testing: No dysmetria or intention tremor on  finger to nose testing. Heel to shin is unremarkable bilaterally. There is no truncal or gait ataxia.  Sensory exam: intact to light touch in the upper and lower extremities.  Gait, station and balance: She stands easily. No veering to one side is noted. No leaning to one side is noted. Posture is age-appropriate and stance is narrow based. Gait shows normal stride length and normal pace. No problems turning are noted. Tandem walk is unremarkable.   Assessment and Plan:  In summary, DAYRA WROBLEWSKI is a very pleasant 50 y.o.-year old female with an underlying medical history of positive anticardiolipin antibody state, hyperlipidemia, hypertension, kidney stones, migraine headaches and obesity, whose history and physical exam are concerning for obstructive sleep apnea (OSA). Concerning factors in her case are worsening headaches, loud snoring reported, family history of obstructive sleep apnea and weight gain. I had a long chat with the patient about my findings and the diagnosis of OSA, its prognosis and treatment options. We talked about medical treatments, surgical interventions and non-pharmacological approaches. I explained in particular the risks and ramifications of untreated moderate to severe OSA, especially with respect to developing cardiovascular disease down the Road, including congestive  heart failure, difficult to treat hypertension, cardiac arrhythmias, or stroke. Even type 2 diabetes has, in part, been linked to untreated OSA. Symptoms of untreated OSA include daytime sleepiness, memory problems, mood irritability and mood disorder such as depression and anxiety, lack of energy, as well as recurrent headaches, especially morning headaches. We talked about smoking cessation and trying to maintain a healthy lifestyle in general, as well as the importance of weight control. I encouraged the patient to eat healthy, exercise daily and keep well hydrated, to keep a scheduled bedtime and wake time routine, to not skip any meals and eat healthy snacks in between meals. I advised the patient not to drive when feeling sleepy. I recommended the following at this time: sleep study with potential positive airway pressure titration. (We will score hypopneas at 3% and split the sleep study into diagnostic and treatment portion, if the estimated. 2 hour AHI is >15/h).   I explained the sleep test procedure to the patient and also outlined possible surgical and non-surgical treatment options of OSA, including the use of a custom-made dental device (which would require a referral to a specialist dentist or oral surgeon), upper airway surgical options, such as pillar implants, radiofrequency surgery, tongue base surgery, and UPPP (which would involve a referral to an ENT surgeon). Rarely, jaw surgery such as mandibular advancement may be considered.  I also explained the CPAP treatment option to the patient, who indicated that she would be willing to try CPAP if the need arises. I explained the importance of being compliant with PAP treatment, not only for insurance purposes but primarily to improve Her symptoms, and for the patient's long term health benefit, including to reduce Her cardiovascular risks. I answered all her questions today and the patient was in agreement. I would like to see her back after  the sleep study is completed and encouraged her to call with any interim questions, concerns, problems or updates.   Thank you very much for allowing me to participate in the care of this nice patient. If I can be of any further assistance to you please do not hesitate to talk to me.   Sincerely,   Star Age, MD, PhD

## 2016-07-29 ENCOUNTER — Ambulatory Visit (HOSPITAL_COMMUNITY)
Admission: RE | Admit: 2016-07-29 | Discharge: 2016-07-29 | Disposition: A | Discharge status: Home / Self Care | Payer: 59 | Source: Ambulatory Visit | Attending: Diagnostic Neuroimaging | Admitting: Diagnostic Neuroimaging

## 2016-07-29 DIAGNOSIS — G43109 Migraine with aura, not intractable, without status migrainosus: Secondary | ICD-10-CM | POA: Diagnosis not present

## 2016-07-29 DIAGNOSIS — G459 Transient cerebral ischemic attack, unspecified: Secondary | ICD-10-CM | POA: Diagnosis present

## 2016-07-29 DIAGNOSIS — I340 Nonrheumatic mitral (valve) insufficiency: Secondary | ICD-10-CM | POA: Insufficient documentation

## 2016-07-29 DIAGNOSIS — G458 Other transient cerebral ischemic attacks and related syndromes: Secondary | ICD-10-CM | POA: Insufficient documentation

## 2016-07-29 DIAGNOSIS — I071 Rheumatic tricuspid insufficiency: Secondary | ICD-10-CM | POA: Insufficient documentation

## 2016-07-29 DIAGNOSIS — I10 Essential (primary) hypertension: Secondary | ICD-10-CM | POA: Diagnosis not present

## 2016-07-29 DIAGNOSIS — E785 Hyperlipidemia, unspecified: Secondary | ICD-10-CM | POA: Insufficient documentation

## 2016-07-29 NOTE — Progress Notes (Signed)
  Echocardiogram 2D Echocardiogram has been performed.  Donata Clay 07/29/2016, 2:47 PM

## 2016-08-02 ENCOUNTER — Encounter: Payer: Self-pay | Admitting: Diagnostic Neuroimaging

## 2016-08-03 ENCOUNTER — Telehealth: Payer: Self-pay | Admitting: *Deleted

## 2016-08-03 NOTE — Telephone Encounter (Signed)
Per Dr Leta Baptist, spoke with patient and informed her that both her ECHO and carotid US result findings were unremarkable. There are no major findings.  Advised Dr Linna Hoff will continue with her current treatment plan. She stated she got a call to schedule her sleep study. She verbalized understanding, appreciation of call.

## 2016-08-05 ENCOUNTER — Ambulatory Visit: Payer: 59 | Admitting: Neurology

## 2016-08-10 ENCOUNTER — Encounter: Payer: Self-pay | Admitting: Diagnostic Neuroimaging

## 2016-08-10 ENCOUNTER — Ambulatory Visit (INDEPENDENT_AMBULATORY_CARE_PROVIDER_SITE_OTHER): Payer: 59 | Admitting: Diagnostic Neuroimaging

## 2016-08-10 VITALS — BP 137/84 | HR 81 | Wt 184.6 lb

## 2016-08-10 DIAGNOSIS — G43109 Migraine with aura, not intractable, without status migrainosus: Secondary | ICD-10-CM

## 2016-08-10 DIAGNOSIS — R51 Headache: Secondary | ICD-10-CM | POA: Diagnosis not present

## 2016-08-10 DIAGNOSIS — Z6831 Body mass index (BMI) 31.0-31.9, adult: Secondary | ICD-10-CM | POA: Diagnosis not present

## 2016-08-10 DIAGNOSIS — I10 Essential (primary) hypertension: Secondary | ICD-10-CM

## 2016-08-10 DIAGNOSIS — R519 Headache, unspecified: Secondary | ICD-10-CM

## 2016-08-10 DIAGNOSIS — G4719 Other hypersomnia: Secondary | ICD-10-CM | POA: Diagnosis not present

## 2016-08-10 NOTE — Progress Notes (Signed)
[facility]  PATIENT: Kelly [name] DOB: 1965-09-15  REFERRING CLINICIAN: Tysinger HISTORY FROM: patient REASON FOR VISIT: follow up   HISTORICAL  CHIEF COMPLAINT:  Chief Complaint  Patient presents with  . Migraine    rm 7, "headaches better, still have one daily but not as bad; what's the next step"  . Follow-up    4 wk, post MRI, ECHO, doppler    HISTORY OF PRESENT ILLNESS:   UPDATE 08/10/16: Still with high stress levels. Still with daily headaches. Tolerating medications. Sleep study pending.   PRIOR HPI (07/05/16): 50 year old female here for evaluation of headaches. Patient has history of hypertension, hypercholesterolemia, migraine. Since age 50 years old patient has had intense intermittent severe headaches with nausea, vomiting, seeing spots and sparkles. These occurred infrequently and were well managed with Axert rescue medication. Patient tried topiramate in the past. For many years headaches have been very well controlled. In past 1 month patient has had a change in her headaches now almost daily headaches, typically worse later in the day. She has had some associated right arm and left leg numbness, lip numbness, trouble speaking with some of her headaches. Patient feels headache in her right frontal region radiating into her right posterior neck region. Symptoms worse with coughing, sneezing, leaning forward. Patient having photophobia, phonophobia and smell sensitivity. Now over-the-counter medication, Axert, Percocet are not helping her headaches. She has tried metoprolol for the last few weeks to help with blood pressure and headache prevention but no benefit yet.   REVIEW OF SYSTEMS: Full 14 system review of systems performed and negative with exception of: Fatigue eye pain snoring headache dizziness.   ALLERGIES: Allergies  Allergen Reactions  . Imitrex [Sumatriptan Base] Other (See Comments)    Esophageal discomfort most likely serotonin  effect  . Zomig Other (See Comments)    Throat discomfort, most likely serotonin effect   . Avelox [Moxifloxacin Hcl In Nacl] Hives  . Morphine Nausea And Vomiting  . Septra [Bactrim] Hives    HOME MEDICATIONS: Outpatient Medications Prior to Visit  Medication Sig Dispense Refill  . almotriptan (AXERT) 12.5 MG tablet Take 1 tablet (12.5 mg total) by mouth as needed for migraine. may repeat in 2 hours if needed 10 tablet 0  . atorvastatin (LIPITOR) 20 MG tablet Take 1 tablet (20 mg total) by mouth at bedtime. 90 tablet 4  . Azelastine-Fluticasone (DYMISTA) 137-50 MCG/ACT SUSP Place 1 spray into the nose 2 (two) times daily. 1 Bottle 0  . Diclofenac Potassium (CAMBIA) 50 MG PACK Take 50 mg by mouth as needed (for migraine; max 1 packet per day). 9 each 3  . ibuprofen (ADVIL,MOTRIN) 200 MG tablet Take 200 mg by mouth as needed for pain. Reported on 11/23/2015    . levonorgestrel (MIRENA) 20 MCG/24HR IUD 1 each by Intrauterine route once.    . metoprolol tartrate (LOPRESSOR) 25 MG tablet Take 1 tablet (25 mg total) by mouth 2 (two) times daily. 1 tablet po BID for blood pressure 60 tablet 5  . Multiple Vitamin (MULTIVITAMIN WITH MINERALS) TABS Take 1 tablet by mouth daily.    Marland Kitchen oxyCODONE-acetaminophen (PERCOCET) 7.5-325 MG tablet Take 1 tablet by mouth every 4 (four) hours as needed for severe pain. 15 tablet 0  . rizatriptan (MAXALT-MLT) 10 MG disintegrating tablet Take 1 tablet (10 mg total) by mouth as needed for migraine. May repeat in 2 hours if needed 9 tablet 11  . topiramate (TOPAMAX) 50 MG tablet Take 1 tablet (50  mg total) by mouth 2 (two) times daily. 60 tablet 12   No facility-administered medications prior to visit.     PAST MEDICAL HISTORY: Past Medical History:  Diagnosis Date  . Anticardiolipin antibody positive   . HSV-2 (herpes simplex virus 2) infection   . Hyperlipidemia   . Hypertension    diet controlled  . Migraine   . Renal calculus, right     PAST SURGICAL  HISTORY: Past Surgical History:  Procedure Laterality Date  . CESAREAN SECTION     X3   LAST ONE 11-30-2004  . CYSTOSCOPY W/ RETROGRADES Right 10/29/2012   Procedure: CYSTOSCOPY WITH RETROGRADE PYELOGRAM and stent placement;  Surgeon: Reece Packer, MD;  Location: [place];  Service: Urology;  Laterality: Right;  rt stent placement , rt retrograde and cysto   . CYSTOSCOPY W/ URETERAL STENT PLACEMENT Right 11/23/2012   Procedure: CYSTOSCOPY WITH STENT REPLACEMENT;  Surgeon: Alexis Frock, MD;  Location: [facility];  Service: Urology;  Laterality: Right;  . CYSTOSCOPY/RETROGRADE/URETEROSCOPY/STONE EXTRACTION WITH BASKET Right 11/23/2012   Procedure: CYSTOSCOPY/RETROGRADE/URETEROSCOPY/STONE EXTRACTION WITH BASKET;  Surgeon: Alexis Frock, MD;  Location: [facility];  Service: Urology;  Laterality: Right;    FAMILY HISTORY: Family History  Problem Relation Age of Onset  . Diabetes Father   . Hypertension Father   . Cancer Father     colon cancer  . Kidney Stones Father   . Heart disease Mother 83    died suddenly of MI  . Heart attack Mother   . Diabetes Sister   . Heart disease Maternal Uncle 10    died of MI  . Heart disease Maternal Uncle   . Breast cancer Maternal Grandmother     SOCIAL HISTORY:  Social History   Social History  . Marital status: Single    Spouse name: N/A  . Number of children: 3  . Years of education: 55   Occupational History  .      [employer], pt advocate   Social History Main Topics  . Smoking status: Current Every Day Smoker    Packs/day: 0.50    Years: 8.00    Types: Cigarettes  . Smokeless tobacco: Never Used  . Alcohol use No  . Drug use: No  . Sexual activity: Yes    Birth control/ protection: IUD   Other Topics Concern  . Not on file   Social History Narrative  . No narrative on file     PHYSICAL EXAM  GENERAL EXAM/CONSTITUTIONAL: Vitals:  Vitals:   08/10/16 1100    BP: 137/84  Pulse: 81  Weight: 184 lb 9.6 oz (83.7 kg)   Body mass index is 31.69 kg/m. No exam data present  Patient is in no distress; well developed, nourished and groomed; neck is supple  CARDIOVASCULAR:  Examination of carotid arteries is normal; no carotid bruits  Regular rate and rhythm, no murmurs  Examination of peripheral vascular system by observation and palpation is normal  EYES:  Ophthalmoscopic exam of optic discs and posterior segments is normal; no papilledema or hemorrhages  MUSCULOSKELETAL:  Gait, strength, tone, movements noted in Neurologic exam below  NEUROLOGIC: MENTAL STATUS:  No flowsheet data found.  awake, alert, oriented to person, place and time  recent and remote memory intact  normal attention and concentration  language fluent, comprehension intact, naming intact,   fund of knowledge appropriate  CRANIAL NERVE:   2nd - no papilledema on fundoscopic exam  2nd, 3rd,  4th, 6th - pupils equal and reactive to light, visual fields full to confrontation, extraocular muscles intact, no nystagmus  5th - facial sensation symmetric  7th - facial strength symmetric  8th - hearing intact  9th - palate elevates symmetrically, uvula midline  11th - shoulder shrug symmetric  12th - tongue protrusion midline  MOTOR:   normal bulk and tone, full strength in the BUE, BLE  SENSORY:   normal and symmetric to light touch, temperature, vibration  COORDINATION:   finger-nose-finger, fine finger movements normal  REFLEXES:   deep tendon reflexes present and symmetric  GAIT/STATION:   narrow based gait; able to walk  tandem; romberg is negative    DIAGNOSTIC DATA (LABS, IMAGING, TESTING) - I reviewed patient records, labs, notes, testing and imaging myself where available.  Lab Results  Component Value Date   WBC 13.0 (H) 06/29/2016   HGB 13.6 06/29/2016   HCT 39.5 06/29/2016   MCV 89.8 06/29/2016   PLT 357 06/29/2016       Component Value Date/Time   NA 140 06/29/2016 0937   K 3.9 06/29/2016 0937   CL 103 06/29/2016 0937   CO2 29 06/29/2016 0937   GLUCOSE 78 06/29/2016 0937   BUN 16 06/29/2016 0937   CREATININE 0.67 06/29/2016 0937   CALCIUM 9.1 06/29/2016 0937   PROT 6.4 05/17/2016 1442   ALBUMIN 4.0 05/17/2016 1442   AST 17 05/17/2016 1442   ALT 15 05/17/2016 1442   ALKPHOS 76 05/17/2016 1442   BILITOT 0.5 05/17/2016 1442   GFRNONAA >60 01/11/2015 1749   GFRAA >60 01/11/2015 1749   Lab Results  Component Value Date   CHOL 210 (H) 05/17/2016   HDL 33 (L) 05/17/2016   LDLCALC NOT CALC 05/17/2016   TRIG 467 (H) 05/17/2016   CHOLHDL 6.4 (H) 05/17/2016   No results found for: HGBA1C No results found for: VITAMINB12 Lab Results  Component Value Date   TSH 1.80 06/29/2016    05/03/09 CT head [I reviewed images myself and agree with interpretation. -VRP]  - no acute findings  07/06/16 carotid u/s - negative  07/29/16         ASSESSMENT AND PLAN  50 y.o. year old female here with history of migraine headaches since age 29 years old, now with 1 month of severe daily headaches with change in severity and quality. Also with focal neurologic symptoms. May represent transformed migraine versus TIA.    Ddx: complicated migraine, chronic migraine with aura, transformed migraine  1. Worsening headaches   2. Migraine with aura and without status migrainosus, not intractable   3. Excessive daytime sleepiness   4. Elevated blood pressure reading in office with diagnosis of hypertension   5. BMI 31.0-31.9,adult      PLAN: - follow up sleep study - medical migraine treatment --> continue topiramate 50mg  daily (drink plenty of water; caution with kidney stone history) - rizatriptan as needed for migraine - consider increase in metoprolol per PCP to help with BP and migraine prevention - consider botox treatment for chronic migraine - encouraged physical activity and exercise;  consider PT evaluation  Return in about 4 months (around 12/09/2016).    Penni Bombard, MD [id number], [id number] AM Certified in Neurology, Neurophysiology and Neuroimaging  [facility] [address], [place], [place] [address] [phone]

## 2016-08-10 NOTE — Patient Instructions (Signed)
-   follow up sleep study  - medical migraine treatment --> continue topiramate 50mg  daily (drink plenty of water; caution with kidney stone history)  - rizatriptan as needed for migraine  - consider increase in metoprolol per PCP to help with BP and migraine prevention  - consider botox treatment for chronic migraine  - encouraged physical activity and exercise; consider PT evaluation

## 2016-08-11 ENCOUNTER — Telehealth: Payer: Self-pay | Admitting: Medical

## 2016-08-11 ENCOUNTER — Other Ambulatory Visit: Payer: Self-pay | Admitting: Medical

## 2016-08-11 MED ORDER — METOPROLOL TARTRATE 50 MG PO TABS: 50.0000 mg | ORAL_TABLET | Freq: Two times a day (BID) | ORAL | 2 refills | Status: DC

## 2016-08-11 MED FILL — METOPROLOL TARTRATE 50 MG T: 50 | 90 days supply | Qty: 180 | Fill #0

## 2016-08-11 NOTE — Telephone Encounter (Signed)
I reviewed the neurology notes from yesterday.  Let her know to double up on the Metoprolol 25mg , so take 2 tablets BID until she runs out.  I have sent the new 50mg  tablet dose to pharmacy

## 2016-08-11 NOTE — Telephone Encounter (Signed)
Called gave her this information about this .

## 2016-08-17 ENCOUNTER — Telehealth: Payer: Self-pay | Admitting: Diagnostic Neuroimaging

## 2016-08-17 NOTE — Telephone Encounter (Signed)
Pt called just checking status on botox. Please call

## 2016-08-17 NOTE — Telephone Encounter (Signed)
Spoke with patient and informed her that Andee Poles is out of the office until next Wed. Informed her this RN will route her request to Danielle to be called back next week about status of Botox. She verbalized understanding, appreciation.

## 2016-08-18 ENCOUNTER — Ambulatory Visit (INDEPENDENT_AMBULATORY_CARE_PROVIDER_SITE_OTHER): Payer: 59 | Admitting: Neurology

## 2016-08-18 DIAGNOSIS — G4761 Periodic limb movement disorder: Secondary | ICD-10-CM

## 2016-08-18 DIAGNOSIS — G4733 Obstructive sleep apnea (adult) (pediatric): Secondary | ICD-10-CM | POA: Diagnosis not present

## 2016-08-18 DIAGNOSIS — G4720 Circadian rhythm sleep disorder, unspecified type: Secondary | ICD-10-CM

## 2016-08-19 MED FILL — ATORVASTATIN 20 MG TABLET: 20 | 90 days supply | Qty: 90 | Fill #1

## 2016-08-19 MED FILL — RIZATRIPTAN 10 MG ODT: 10 | 30 days supply | Qty: 9 | Fill #2

## 2016-08-19 MED FILL — TOPIRAMATE 50 MG TABLET: 50 | 30 days supply | Qty: 60 | Fill #2

## 2016-08-24 NOTE — Progress Notes (Signed)
Patient referred by Dr. Leta Baptist, seen by me on 07/28/16, diagnostic PSG on 08/18/16.    Please call and notify the patient that the recent sleep study did confirm the diagnosis of obstructive sleep apnea, mild to severe (during REM) and that I recommend treatment for this in the form of CPAP. This will require a repeat sleep study for proper titration and mask fitting. Please explain to patient and arrange for a CPAP titration study. I have placed an order in the chart. Thanks, and please route to [facility] for scheduling next sleep study.  Star Age, MD, PhD [facility])

## 2016-08-24 NOTE — Addendum Note (Signed)
Addended by: Star Age on: 08/24/2016 09:24 AM   Modules accepted: Orders

## 2016-08-24 NOTE — Procedures (Signed)
PATIENT'S NAME:  Kelly [name], Kelly [name] DOB:      05-07-1966      MR#:    [id number]     DATE OF RECORDING: 08/18/2016 REFERRING M.D.:  Jill Alexanders MD Study Performed:   Baseline Polysomnogram HISTORY: 51 year old woman with a history of positive anticardiolipin antibody state, hyperlipidemia, hypertension, kidney stones, migraine headaches and obesity, who reports snoring and excessive daytime somnolence. The patient endorsed the Epworth Sleepiness Scale at 8 points. The patient's weight 183 pounds with a height of 64 (inches), resulting in a BMI of 31.2 kg/m2. The patient's neck circumference measured 15 inches.  CURRENT MEDICATIONS: Axert, Lipitor, Dymista, Diclofenac, Advil, Mirena, Lopressor, Percocet, Maxalt, Topamax   PROCEDURE:  This is a multichannel digital polysomnogram utilizing the Somnostar 11.2 system.  Electrodes and sensors were applied and monitored per AASM Specifications.   EEG, EOG, Chin and Limb EMG, were sampled at 200 Hz.  ECG, Snore and Nasal Pressure, Thermal Airflow, Respiratory Effort, CPAP Flow and Pressure, Oximetry was sampled at 50 Hz. Digital video and audio were recorded.      BASELINE STUDY: Lights Out was at 22:56 and Lights On at 05:01.  Total recording time (TRT) was 365.5 minutes, with a total sleep time (TST) of  293 minutes.   The patient's sleep latency was 29 minutes, which is mildly prolonged.  REM latency was 92.5 minutes, which is normal.  The sleep efficiency was 80.2 %.     SLEEP ARCHITECTURE: WASO (Wake after sleep onset) was 43 minutes with mild to moderate sleep fragmentation noted.  There were 28.5 minutes in Stage N1, 176 minutes Stage N2, 70 minutes Stage N3 and 18.5 minutes in Stage REM.  The percentage of Stage N1 was 9.7%, which is increased, Stage N2 was 60.1%, which is mildly increased, Stage N3 was 23.9%, which is increased, and Stage R (REM sleep) was 6.3%, which is reduced.   The arousals were noted as: 41 were spontaneous, 1 were associated with  PLMs, 9 were associated with respiratory events. Audio and video analysis did not show any abnormal or unusual movements, behaviors, phonations or vocalizations.  The patient took 1 bathroom break. Intermittent mild to moderate snoring was noted.  The EKG was in keeping with normal sinus rhythm (NSR).  RESPIRATORY ANALYSIS:  There were a total of 33 respiratory events:  20 obstructive apneas, 0 central apneas and 0 mixed apneas with a total of 20 apneas and an apnea index (AI) of 4.1 /hour. There were 13 hypopneas with a hypopnea index of 2.7 /hour. The patient also had 2 respiratory event related arousals (RERAs).      The total APNEA/HYPOPNEA INDEX (AHI) was 6.8/hour and the total RESPIRATORY DISTURBANCE INDEX was 7.2 /hour.  14 events occurred in REM sleep and 18 events in NREM. The REM AHI was 45.4 /hour, versus a non-REM AHI of 4.2. The patient spent 279.5 minutes of total sleep time in the supine position and 14 minutes in non-supine.. The supine AHI was 7.1 versus a non-supine AHI of 0.0.  OXYGEN SATURATION & C02:  The Wake baseline 02 saturation was 98%, with the lowest being 80%. Time spent below 89% saturation equaled 3 minutes.  PERIODIC LIMB MOVEMENTS: The patient had a total of 79 Periodic Limb Movements.  The Periodic Limb Movement (PLM) index was 16.2 and the PLM Arousal index was .2/hour.  Post-study, the patient indicated that sleep was worse than usual.   IMPRESSION: 1. Obstructive Sleep Apnea (OSA) 2. Periodic Limb Movement Disorder (PLMD)  3. Dysfunctions associated with sleep stages or arousal from sleep  RECOMMENDATIONS: 1. This study demonstrates overall mild obstructive sleep apnea, severe in REM sleep with a total AHI of 6.8/hour, REM AHI of 45.4/hour, and O2 nadir of 80%. Given the patient's medical history and sleep related complaints, as well as worsening headaches, a full-night CPAP titration study is recommended to optimize therapy. Other treatment options may include  avoidance of supine sleep position along with weight loss, upper airway or jaw surgery in selected patients or the use of an oral appliance in certain patients. ENT evaluation and/or consultation with a maxillofacial surgeon or dentist may be feasible and some instances.    2. Please note that untreated obstructive sleep apnea carries additional perioperative morbidity. Patients with significant obstructive sleep apnea should receive perioperative PAP therapy and the surgeons and particularly the anesthesiologist should be informed of the diagnosis and the severity of the sleep disordered breathing. 3. The patient should be cautioned not to drive, work at heights, or operate dangerous or heavy equipment when tired or sleepy. Review and reiteration of good sleep hygiene measures should be pursued with any patient. 4. This study shows sleep fragmentation and abnormal sleep stage percentages; these are nonspecific findings and per se do not signify an intrinsic sleep disorder or a cause for the patient's sleep-related symptoms. Causes include (but are not limited to) the first night effect of the sleep study, circadian rhythm disturbances, medication effect or an underlying mood disorder or medical problem.  5. Mild PLMs (periodic limb movements of sleep) were noted during the study without significant arousals; clinical correlation is recommended.  6. The patient will be seen in follow-up by Dr. Rexene Alberts at [facility] for discussion of the test results and further management strategies. The referring provider will be notified of the test results.  I certify that I have reviewed the entire raw data recording prior to the issuance of this report in accordance with the Standards of Accreditation of the American Academy of Sleep Medicine (AASM)    Star Age, MD, PhD Diplomat, American Board of Psychiatry and Neurology (Neurology and Sleep Medicine)

## 2016-08-25 ENCOUNTER — Telehealth: Payer: Self-pay

## 2016-08-25 NOTE — Telephone Encounter (Signed)
I spoke to patient and she is aware of results. She is willing to do titration study. I will send copy of report to PCP.

## 2016-08-25 NOTE — Telephone Encounter (Signed)
-----   Message from Star Age, MD sent at 08/24/2016  9:24 AM EST ----- Patient referred by Dr. Leta Baptist, seen by me on 07/28/16, diagnostic PSG on 08/18/16.    Please call and notify the patient that the recent sleep study did confirm the diagnosis of obstructive sleep apnea, mild to severe (during REM) and that I recommend treatment for this in the form of CPAP. This will require a repeat sleep study for proper titration and mask fitting. Please explain to patient and arrange for a CPAP titration study. I have placed an order in the chart. Thanks, and please route to [facility] for scheduling next sleep study.  Star Age, MD, PhD [facility])

## 2016-08-30 NOTE — Telephone Encounter (Signed)
Please setup referral to Dr. Jaynee Eagles for botox office visit consult if patient wants to pursue. -VRP

## 2016-08-30 NOTE — Telephone Encounter (Signed)
I called the patient do discuss setting up a consultation for her injections. She did not answer so I left a VM asking her to call me back.  Dr. Leta Baptist,  Is she receiving a referral to have a botox consultation with another doctor in the office and if so whose schedule should I put her on?

## 2016-08-31 NOTE — Telephone Encounter (Signed)
Pt calling in reference to scheduling her botox appt. Please call.

## 2016-09-12 NOTE — Telephone Encounter (Signed)
Called patient to schedule consultation with Dr. Jaynee Eagles, she did not answer so I left a VM asking her to call back.

## 2016-09-14 NOTE — Telephone Encounter (Signed)
Patient returning call. Please call.

## 2016-09-15 NOTE — Telephone Encounter (Signed)
Returned patients call to schedule apt. She did not answer so I left a VM asking her to call back. If she calls back please just schedule her for a NP apt with Dr. Jaynee Eagles and specify in apt notes that it is a botox consultation.

## 2016-09-22 ENCOUNTER — Ambulatory Visit (INDEPENDENT_AMBULATORY_CARE_PROVIDER_SITE_OTHER): Payer: 59 | Admitting: Neurology

## 2016-09-22 DIAGNOSIS — G4733 Obstructive sleep apnea (adult) (pediatric): Secondary | ICD-10-CM

## 2016-09-23 NOTE — Procedures (Signed)
PATIENT'S NAME:  Kelly [name], Kelly [name] DOB:      06-18-1966      MR#:    [id number]     DATE OF RECORDING: 09/22/2016 REFERRING M.D.:  Jill Alexanders MD Study Performed:   CPAP  Titration HISTORY: 51 year old woman with a history of positive anticardiolipin antibody state, hyperlipidemia, hypertension, kidney stones, migraine headaches and obesity, who presents for CPAP titration. Her baseline PSG from 08/18/2016 showed an AHI of 6.8, REM AHI of 45.4, and Supine AHI of 7.1  SPO2 nadir was 80%. The patient endorsed the Epworth Sleepiness Scale at 8 points. The patient's weight 183 pounds with a height of 64 (inches), resulting in a BMI of 31.2 kg/m2. The patient's neck circumference measured 15 inches.  CURRENT MEDICATIONS: Axert, Lipitor, Dymista, Diclofenac, Advil, Mirena, Lopressor, Percocet, Maxalt, Topamax  PROCEDURE:  This is a multichannel digital polysomnogram utilizing the SomnoStar 11.2 system.  Electrodes and sensors were applied and monitored per AASM Specifications.   EEG, EOG, Chin and Limb EMG, were sampled at 200 Hz.  ECG, Snore and Nasal Pressure, Thermal Airflow, Respiratory Effort, CPAP Flow and Pressure, Oximetry was sampled at 50 Hz. Digital video and audio were recorded.      The patient was fitted with a small P10 nasal pillow interface. CPAP was initiated at 5 cmH20 with heated humidity per AASM standards and pressure was advanced to 10 cmH20 because of hypopneas, apneas and desaturations and snoring. At a PAP pressure of 9 cmH20, there was a reduction of the AHI to 0 with a significant amount of supine REM sleep and O2 nadir of 93%, abolished snoring.   Lights Out was at 22:57 and Lights On at 05:12. Total recording time (TRT) was 376 minutes, with a total sleep time (TST) of 308.5 minutes. The patient's sleep latency was 53 minutes, which is prolonged, REM latency was 32.5 minutes, which is reduced.  The sleep efficiency was 82. %.    SLEEP ARCHITECTURE: WASO (Wake after sleep onset)  was  15.5 minutes with mild sleep fragmentation noted.  There were 15 minutes in Stage N1, 136.5 minutes Stage N2, 97.5 minutes Stage N3 and 59.5 minutes in Stage REM.  The percentage of Stage N1 was 4.9%, Stage N2 was 44.2%, Stage N3 was 31.6%, which is increased, and Stage R (REM sleep) was 19.3%, which is normal.   The arousals were noted as: 24 were spontaneous, 0 were associated with PLMs, 0 were associated with respiratory events.  Audio and video analysis did not show any abnormal or unusual movements, behaviors, phonations or vocalizations.  The patient took no bathroom breaks. The EKG was in keeping with normal sinus rhythm (NSR).  RESPIRATORY ANALYSIS:  There was a total of 0 respiratory events: 0 obstructive apneas, 0 central apneas and 0 mixed apneas with a total of 0 apneas and an apnea index (AI) of 0 /hour. There were 0 hypopneas with a hypopnea index of 0/hour. The patient also had 0 respiratory event related arousals (RERAs).      The total APNEA/HYPOPNEA INDEX  (AHI) was 0 /hour and the total RESPIRATORY DISTURBANCE INDEX was 0 .hour  0 events occurred in REM sleep and 0 events in NREM. The REM AHI was 0 /hour versus a non-REM AHI of 0 /hour.  The patient spent 308.5 minutes of total sleep time in the supine position and 0 minutes in non-supine. The supine AHI was 0.0, versus a non-supine AHI of 0.0.  OXYGEN SATURATION & C02:  The baseline 02 saturation  was 96%, with the lowest being 84% (error, likely 93%). Time spent below 89% saturation equaled 0 minutes.  PERIODIC LIMB MOVEMENTS:    The patient had a total of 0 Periodic Limb Movements. The Periodic Limb Movement (PLM) index was 0 and the PLM Arousal index was 0 /hour.  Post-study, the patient indicated that sleep was better than usual.  DIAGNOSIS  1. Obstructive Sleep Apnea   PLANS/RECOMMENDATIONS:  1. This study demonstrates resolution of the patient's obstructive sleep apnea with CPAP therapy. I will, therefore, start the  patient on home CPAP treatment at a pressure of 9 cm via small nasal pillows with heated humidity. The patient should be reminded to be fully compliant with PAP therapy to improve sleep related symptoms and decrease long term cardiovascular risks. The patient should be reminded, that it may take up to 3 months to get fully used to using PAP with all planned sleep. The earlier full compliance is achieved, the better long term compliance tends to be. Please note that untreated obstructive sleep apnea carries additional perioperative morbidity. Patients with significant obstructive sleep apnea should receive perioperative PAP therapy and the surgeons and particularly the anesthesiologist should be informed of the diagnosis and the severity of the sleep disordered breathing. 2. The patient should be cautioned not to drive, work at heights, or operate dangerous or heavy equipment when tired or sleepy. Review and reiteration of good sleep hygiene measures should be pursued with any patient. 3. The patient will be seen in follow-up by Dr. Rexene Alberts at [facility] for discussion of the test results and further management strategies. The referring provider will be notified of the test results.  I certify that I have reviewed the entire raw data recording prior to the issuance of this report in accordance with the Standards of Accreditation of the American Academy of Sleep Medicine (AASM)   Star Age, MD, PhD Diplomat, American Board of Psychiatry and Neurology (Neurology and Sleep Medicine)

## 2016-09-23 NOTE — Progress Notes (Signed)
Patient referred by Dr. Leta Baptist, seen by me on 07/28/16, diagnostic PSG on 08/18/16, CPAP study on 09/22/16.  Please call and inform patient that I have entered an order for treatment with positive airway pressure (PAP) treatment of obstructive sleep apnea (OSA). She did well during the latest sleep study with CPAP. We will, therefore, arrange for a machine for home use through a DME (durable medical equipment) company of Her choice; and I will see the patient back in follow-up in about 8-10 weeks. Please also explain to the patient that I will be looking out for compliance data, which can be downloaded from the machine (stored on an SD card, that is inserted in the machine) or via remote access through a modem, that is built into the machine. At the time of the followup appointment we will discuss sleep study results and how it is going with PAP treatment at home. Please advise patient to bring Her machine at the time of the first FU visit, even though this is cumbersome. Bringing the machine for every visit after that will likely not be needed, but often helps for the first visit to troubleshoot if needed. Please re-enforce the importance of compliance with treatment and the need for [place] to monitor compliance data - often an insurance requirement and actually good feedback for the patient as far as how they are doing.  Also remind patient, that any interim PAP machine or mask issues should be first addressed with the DME company, as they can often help better with technical and mask fit issues. Please ask if patient has a preference regarding DME company.  Please also make sure, the patient has a follow-up appointment with me in about 8-10 weeks from the setup date, thanks.  Once you have spoken to the patient - and faxed/routed report to PCP and referring MD (if other than PCP), you can close this encounter, thanks,   Star Age, MD, PhD [facility] ([place])

## 2016-09-23 NOTE — Addendum Note (Signed)
Addended by: Star Age on: 09/23/2016 02:48 PM   Modules accepted: Orders

## 2016-09-26 ENCOUNTER — Telehealth: Payer: Self-pay

## 2016-09-26 NOTE — Telephone Encounter (Signed)
Patient called back and she is aware of results and recommendations. She is willing to start treatment. I will send orders to [facility]. Patient wanted to call back and make appt. PCP will get a copy of the report. Patient will get a letter reminding her to make appt and stress the importance of compliance.

## 2016-09-26 NOTE — Telephone Encounter (Signed)
-----   Message from Star Age, MD sent at 09/23/2016  2:48 PM EST ----- Patient referred by Dr. Leta Baptist, seen by me on 07/28/16, diagnostic PSG on 08/18/16, CPAP study on 09/22/16.  Please call and inform patient that I have entered an order for treatment with positive airway pressure (PAP) treatment of obstructive sleep apnea (OSA). She did well during the latest sleep study with CPAP. We will, therefore, arrange for a machine for home use through a DME (durable medical equipment) company of Her choice; and I will see the patient back in follow-up in about 8-10 weeks. Please also explain to the patient that I will be looking out for compliance data, which can be downloaded from the machine (stored on an SD card, that is inserted in the machine) or via remote access through a modem, that is built into the machine. At the time of the followup appointment we will discuss sleep study results and how it is going with PAP treatment at home. Please advise patient to bring Her machine at the time of the first FU visit, even though this is cumbersome. Bringing the machine for every visit after that will likely not be needed, but often helps for the first visit to troubleshoot if needed. Please re-enforce the importance of compliance with treatment and the need for [place] to monitor compliance data - often an insurance requirement and actually good feedback for the patient as far as how they are doing.  Also remind patient, that any interim PAP machine or mask issues should be first addressed with the DME company, as they can often help better with technical and mask fit issues. Please ask if patient has a preference regarding DME company.  Please also make sure, the patient has a follow-up appointment with me in about 8-10 weeks from the setup date, thanks.  Once you have spoken to the patient - and faxed/routed report to PCP and referring MD (if other than PCP), you can close this encounter, thanks,   Star Age, MD,  PhD [facility] ([place])

## 2016-09-26 NOTE — Telephone Encounter (Signed)
LM for patient to call back for results

## 2016-10-06 DIAGNOSIS — G4733 Obstructive sleep apnea (adult) (pediatric): Secondary | ICD-10-CM | POA: Diagnosis not present

## 2016-10-11 NOTE — Telephone Encounter (Signed)
Patient called office in reference to scheduling Botox appointment.  Please call.  If you can't reach her on her cell phone please call her at [phone] (work #).

## 2016-10-14 NOTE — Telephone Encounter (Signed)
Late entry;  I spoke with the patient yesterday and scheduled injection.

## 2016-11-01 ENCOUNTER — Encounter: Payer: Self-pay | Admitting: Neurology

## 2016-11-01 ENCOUNTER — Telehealth: Payer: Self-pay | Admitting: Neurology

## 2016-11-01 ENCOUNTER — Ambulatory Visit (INDEPENDENT_AMBULATORY_CARE_PROVIDER_SITE_OTHER): Payer: 59 | Admitting: Neurology

## 2016-11-01 VITALS — BP 119/76 | HR 82 | Ht 64.0 in | Wt 179.0 lb

## 2016-11-01 DIAGNOSIS — G43709 Chronic migraine without aura, not intractable, without status migrainosus: Secondary | ICD-10-CM | POA: Diagnosis not present

## 2016-11-01 MED FILL — RIZATRIPTAN 10 MG ODT: 10 | 30 days supply | Qty: 18 | Fill #3

## 2016-11-01 NOTE — Telephone Encounter (Signed)
Kelly [name] tis scheduled for botox next week. DO you need me to fill out a botox request form or has that already been completed? Thanks

## 2016-11-01 NOTE — Progress Notes (Signed)
[facility]    Provider:  Dr Jaynee Eagles Referring Provider: Carlena Hurl, PA-C Primary Care Physician:  Crisoforo Oxford, PA-C  CC:  Chronic migraines  HPI:  LISET MCMONIGLE is a 51 y.o. female here as a follow up for chronic migraines. She has a past medical history of hypertension, hypercholesterolemia, migraines. She's had migraines since the age of 26. She has severe headaches unilaterally on the right with nausea, vomiting. Headaches have been daily for 6 months. She reports light sensitivity, sound sensitivity and smell sensitivity. No medication overuse. No auras.She has had daily migraines. They start later in the morning every day.  They can last 24 hours on the right with throbbing and pulsating on the right perioribital areas with radiation to the back of the head. Smells, sounds,light make it worse. She needs to go into a dark room, lay still which helps. Out of 30 days in a month she has daily headaches and would characterize them all as migrainous. Can be severe. 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 She has tried multiple medications including currently metoprolol, topiramate, amitriptyline. She has musculoskeletal neck pain with the headache.No other focal neurologic deficits, associated symptoms, inciting events or modifiable factors. Mother had headaches.  Reviewed notes, labs and imaging from outside physicians, which showed:   Personally reviewed images MRI of the brain and agree with the following:  No abnormal lesions are seen on diffusion-weighted views to suggest acute ischemia. The cortical sulci, fissures and cisterns are normal in size and appearance. Lateral, third and fourth ventricle are normal in size and appearance. No extra-axial fluid collections are seen. No evidence of mass effect or midline shift.    On sagittal views the posterior fossa, pituitary gland and corpus callosum are unremarkable. No evidence of intracranial hemorrhage on  gradient-echo views. The orbits and their contents, paranasal sinuses and calvarium are unremarkable.  Intracranial flow voids are present.   IMPRESSION:  Normal MRI brain (without).  Esr normal 3, TSH nml 1.8  Review of Systems: Patient complains of symptoms per HPI as well as the following symptoms: headaches, numbness, dizziness, slurred speech, snoring, . Pertinent negatives per HPI. All others negative.   Social History   Social History  . Marital status: Single    Spouse name: N/A  . Number of children: 3  . Years of education: 70   Occupational History  .      [employer], pt advocate   Social History Main Topics  . Smoking status: Current Every Day Smoker    Packs/day: 0.50    Years: 8.00    Types: Cigarettes  . Smokeless tobacco: Never Used  . Alcohol use No  . Drug use: No  . Sexual activity: Yes    Birth control/ protection: IUD   Other Topics Concern  . Not on file   Social History Narrative   Lives at home w/ her children   Right-handed   Caffeine: 1 soda per day       Family History  Problem Relation Age of Onset  . Diabetes Father   . Hypertension Father   . Cancer Father     colon cancer  . Kidney Stones Father   . Heart disease Mother 36    died suddenly of MI  . Heart attack Mother   . Diabetes Sister   . Heart disease Maternal Uncle 30    died of MI  . Heart disease Maternal Uncle   . Breast cancer  Maternal Grandmother     Past Medical History:  Diagnosis Date  . Anticardiolipin antibody positive   . HSV-2 (herpes simplex virus 2) infection   . Hyperlipidemia   . Hypertension    diet controlled  . Kidney stone   . Migraine   . Renal calculus, right   . Sleep apnea   . TMJ (dislocation of temporomandibular joint)     Past Surgical History:  Procedure Laterality Date  . CESAREAN SECTION     X3   LAST ONE 11-30-2004  . CYSTOSCOPY W/ RETROGRADES Right 10/29/2012   Procedure: CYSTOSCOPY WITH RETROGRADE PYELOGRAM and  stent placement;  Surgeon: Reece Packer, MD;  Location: [facility];  Service: Urology;  Laterality: Right;  rt stent placement , rt retrograde and cysto   . CYSTOSCOPY W/ URETERAL STENT PLACEMENT Right 11/23/2012   Procedure: CYSTOSCOPY WITH STENT REPLACEMENT;  Surgeon: Alexis Frock, MD;  Location: [facility];  Service: Urology;  Laterality: Right;  . CYSTOSCOPY/RETROGRADE/URETEROSCOPY/STONE EXTRACTION WITH BASKET Right 11/23/2012   Procedure: CYSTOSCOPY/RETROGRADE/URETEROSCOPY/STONE EXTRACTION WITH BASKET;  Surgeon: Alexis Frock, MD;  Location: [facility];  Service: Urology;  Laterality: Right;    Current Outpatient Prescriptions  Medication Sig Dispense Refill  . atorvastatin (LIPITOR) 20 MG tablet Take 1 tablet (20 mg total) by mouth at bedtime. 90 tablet 4  . Azelastine-Fluticasone (DYMISTA) 137-50 MCG/ACT SUSP Place 1 spray into the nose 2 (two) times daily. 1 Bottle 0  . Diclofenac Potassium (CAMBIA) 50 MG PACK Take 50 mg by mouth as needed (for migraine; max 1 packet per day). 9 each 3  . ibuprofen (ADVIL,MOTRIN) 200 MG tablet Take 200 mg by mouth as needed for pain. Reported on 11/23/2015    . levonorgestrel (MIRENA) 20 MCG/24HR IUD 1 each by Intrauterine route once.    . metoprolol (LOPRESSOR) 50 MG tablet Take 1 tablet (50 mg total) by mouth 2 (two) times daily. 60 tablet 2  . Multiple Vitamin (MULTIVITAMIN WITH MINERALS) TABS Take 1 tablet by mouth daily.    . rizatriptan (MAXALT-MLT) 10 MG disintegrating tablet Take 1 tablet (10 mg total) by mouth as needed for migraine. May repeat in 2 hours if needed 9 tablet 11  . topiramate (TOPAMAX) 50 MG tablet Take 1 tablet (50 mg total) by mouth 2 (two) times daily. 60 tablet 12   No current facility-administered medications for this visit.     Allergies as of 11/01/2016 - Review Complete 11/01/2016  Allergen Reaction Noted  . Imitrex [sumatriptan base] Other (See Comments)   . Zomig  Other (See Comments) 06/29/2011  . Avelox [moxifloxacin hcl in nacl] Hives 05/25/2011  . Morphine Nausea And Vomiting   . Septra [bactrim] Hives 05/25/2011    Vitals: BP 119/76   Pulse 82   Ht _0  (1.626 m)   Wt 179 lb (81.2 kg)   BMI 30.73 kg/m  Last Weight:  Wt Readings from Last 1 Encounters:  11/01/16 179 lb (81.2 kg)   Last Height:   Ht Readings from Last 1 Encounters:  11/01/16 _1  (1.626 m)     Physical exam: Exam: Gen: NAD, conversant, well nourised, obese, well groomed                    Eyes: Conjunctivae clear without exudates or hemorrhage  Neuro: Detailed Neurologic Exam  Speech:    Speech is normal; fluent and spontaneous with normal comprehension.  Cognition:    The patient is oriented  to person, place, and time;     recent and remote memory intact;     language fluent;     normal attention, concentration,     fund of knowledge Cranial Nerves:    The pupils are equal, round, and reactive to light. Visual fields are full to finger confrontation. Extraocular movements are intact. Trigeminal sensation is intact and the muscles of mastication are normal. The face is symmetric. The palate elevates in the midline. Hearing intact. Voice is normal. Shoulder shrug is normal. The tongue has normal motion without fasciculations.   Coordination:    No dysmetria  Motor Observation:    No asymmetry, no atrophy, and no involuntary movements noted. Tone:    Normal muscle tone.    Posture:    Posture is normal. normal erect           Assessment/Plan:  51 year old female with chronic migraines without aura not intractable without status migrainosus who was failed multiple medications including amitriptyline, metoprolol and topiramate. Continues to have daily migraines. No medication overuse. Feel the patient is a good candidate for Botox. Had a long discussion with patient about migraine management, acute and preventative treatments and we have decided to go  forward with Botox for migraines.  Discussed: To prevent or relieve headaches, try the following: Cool Compress. Lie down and place a cool compress on your head.  Avoid headache triggers. If certain foods or odors seem to have triggered your migraines in the past, avoid them. A headache diary might help you identify triggers.  Include physical activity in your daily routine. Try a daily walk or other moderate aerobic exercise.  Manage stress. Find healthy ways to cope with the stressors, such as delegating tasks on your to-do list.  Practice relaxation techniques. Try deep breathing, yoga, massage and visualization.  Eat regularly. Eating regularly scheduled meals and maintaining a healthy diet might help prevent headaches. Also, drink plenty of fluids.  Follow a regular sleep schedule. Sleep deprivation might contribute to headaches Consider biofeedback. With this mind-body technique, you learn to control certain bodily functions - such as muscle tension, heart rate and blood pressure - to prevent headaches or reduce headache pain.    Proceed to emergency room if you experience new or worsening symptoms or symptoms do not resolve, if you have new neurologic symptoms or if headache is severe, or for any concerning symptom.    Sarina Ill, MD  [facility] [address] [place], [place] [address]  Phone [phone] Fax [phone]  A total of 25  minutes was spent face-to-face with this patient. Over half this time was spent on counseling patient on the chronic migraine diagnosis and different diagnostic and therapeutic options available.

## 2016-11-02 NOTE — Telephone Encounter (Signed)
I already have one, thank you!!

## 2016-11-03 DIAGNOSIS — G4733 Obstructive sleep apnea (adult) (pediatric): Secondary | ICD-10-CM | POA: Diagnosis not present

## 2016-11-07 ENCOUNTER — Ambulatory Visit (INDEPENDENT_AMBULATORY_CARE_PROVIDER_SITE_OTHER): Payer: 59 | Admitting: Neurology

## 2016-11-07 ENCOUNTER — Encounter: Payer: Self-pay | Admitting: Neurology

## 2016-11-07 VITALS — Ht 64.0 in | Wt 179.2 lb

## 2016-11-07 DIAGNOSIS — G43709 Chronic migraine without aura, not intractable, without status migrainosus: Secondary | ICD-10-CM

## 2016-11-07 NOTE — Progress Notes (Signed)
Consent Form Botulism Toxin Injection For Chronic Migraine  Botulism toxin has been approved by the Federal drug administration for treatment of chronic migraine. Botulism toxin does not cure chronic migraine and it may not be effective in some patients.  The administration of botulism toxin is accomplished by injecting a small amount of toxin into the muscles of the neck and head. Dosage must be titrated for each individual. Any benefits resulting from botulism toxin tend to wear off after 3 months with a repeat injection required if benefit is to be maintained. Injections are usually done every 3-4 months with maximum effect peak achieved by about 2 or 3 weeks. Botulism toxin is expensive and you should be sure of what costs you will incur resulting from the injection.  The side effects of botulism toxin use for chronic migraine may include:   -Transient, and usually mild, facial weakness with facial injections  -Transient, and usually mild, head or neck weakness with head/neck injections  -Reduction or loss of forehead facial animation due to forehead muscle              weakness  -Eyelid drooping  -Dry eye  -Pain at the site of injection or bruising at the site of injection  -Double vision  -Potential unknown long term risks  Contraindications: You should not have Botox if you are pregnant, nursing, allergic to albumin, have an infection, skin condition, or muscle weakness at the site of the injection, or have myasthenia gravis, Lambert-Eaton syndrome, or ALS.  It is also possible that as with any injection, there may be an allergic reaction or no effect from the medication. Reduced effectiveness after repeated injections is sometimes seen and rarely infection at the injection site may occur. All care will be taken to prevent these side effects. If therapy is given over a long time, atrophy and wasting in the muscle injected may occur. Occasionally the patient's become refractory to  treatment because they develop antibodies to the toxin. In this event, therapy needs to be modified.  I have read the above information and consent to the administration of botulism toxin.    ______________  _____   _________________  Patient signature     Date   Witness signature       BOTOX PROCEDURE NOTE FOR MIGRAINE HEADACHE    Contraindications and precautions discussed with patient(above). Aseptic procedure was observed and patient tolerated procedure. Procedure performed by Dr. Georgia Dom  The condition has existed for more than 6 months, and pt does not have a diagnosis of ALS, Myasthenia Gravis or Lambert-Eaton Syndrome. Risks and benefits of injections discussed and pt agrees to proceed with the procedure. Written consent obtained  These injections are medically necessary. These injections do not cause sedations or hallucinations which the oral therapies may cause.  Indication/Diagnosis: chronic migraine  BOTOX(J0585) injection was performed according to protocol by [employer]. 200 units of BOTOX was dissolved into 4 cc NS.  NDC: [address]-72 Botox 100 units/vial x 2 (buy and bill) NDC 6203-5597-41 Lot [id number] Exp 09 2020  Diluted in 4 ml of Bacteriostatic 0.9% NaCl NDC 4680-3212-24 Lot [id number] Exp [id number]  Topical pain reliever applied to injection areas w/ gauze Lidocaine 5% cream NDC 48889-169-45 Lot [id number] Exp 06/19    Description of procedure:  The patient was placed in a sitting position. The standard protocol was used for Botox as follows, with 5 units of Botox injected at each site:   -Procerus muscle, midline injection  -Corrugator muscle, bilateral  injection  -Frontalis muscle, bilateral injection, with 2 sites each side, medial injection was performed in the upper one third of the frontalis muscle, in the region vertical from the medial inferior edge of the superior orbital rim. The lateral injection was again in the upper one third  of the forehead vertically above the lateral limbus of the cornea, 1.5 cm lateral to the medial injection site.  -Temporalis muscle injection, 4 sites, bilaterally. The first injection was 3 cm above the tragus of the ear, second injection site was 1.5 cm to 3 cm up from the first injection site in line with the tragus of the ear. The third injection site was 1.5-3 cm forward between the first 2 injection sites. The fourth injection site was 1.5 cm posterior to the second injection site.  -Occipitalis muscle injection, 3 sites, bilaterally. The first injection was done one half way between the occipital protuberance and the tip of the mastoid process behind the ear. The second injection site was done lateral and superior to the first, 1 fingerbreadth from the first injection. The third injection site was 1 fingerbreadth superiorly and medially from the first injection site.  -Cervical paraspinal muscle injection, 2 sites, bilateral knee first injection site was 1 cm from the midline of the cervical spine, 3 cm inferior to the lower border of the occipital protuberance. The second injection site was 1.5 cm superiorly and laterally to the first injection site.  -Trapezius muscle injection was performed at 3 sites, bilaterally. The first injection site was in the upper trapezius muscle halfway between the inflection point of the neck, and the acromion. The second injection site was one half way between the acromion and the first injection site. The third injection was done between the first injection site and the inflection point of the neck.   Will return for repeat injection in 3 months.   200 units of Botox was used, 155 units were injected, the rest of the Botox was wasted. The patient tolerated the procedure well, there were no complications of the above procedure.

## 2016-11-07 NOTE — Progress Notes (Signed)
Botox 100 units/vial x 2 (buy and bill) NDC 4403-4742-59 Lot [id number] Exp 09 2020  Diluted in 4 ml of Bacteriostatic 0.9% NaCl NDC 4332-9518-84 Lot [id number] Exp [id number]  Topical pain reliever applied to injection areas w/ gauze Lidocaine 5% cream NDC 60109-323-55 Lot [id number] Exp 06/19

## 2016-11-08 NOTE — Telephone Encounter (Signed)
Referral for integrative therapy has been sent via fax.

## 2016-12-01 ENCOUNTER — Other Ambulatory Visit: Payer: Self-pay | Admitting: Medical

## 2016-12-01 MED FILL — METOPROLOL TARTRATE 50 MG T: 50 | 90 days supply | Qty: 180 | Fill #0

## 2016-12-01 MED FILL — ATORVASTATIN 20 MG TABLET: 20 | 90 days supply | Qty: 90 | Fill #2

## 2016-12-01 MED FILL — TOPIRAMATE 50 MG TABLET: 50 | 30 days supply | Qty: 60 | Fill #3

## 2016-12-01 MED FILL — RIZATRIPTAN 10 MG ODT: 10 | 30 days supply | Qty: 18 | Fill #4

## 2016-12-02 ENCOUNTER — Encounter: Payer: Self-pay | Admitting: Medical

## 2016-12-02 ENCOUNTER — Ambulatory Visit (INDEPENDENT_AMBULATORY_CARE_PROVIDER_SITE_OTHER): Payer: 59 | Admitting: Medical

## 2016-12-02 VITALS — BP 108/74 | HR 78 | Temp 98.5°F

## 2016-12-02 DIAGNOSIS — J45909 Unspecified asthma, uncomplicated: Secondary | ICD-10-CM | POA: Diagnosis not present

## 2016-12-02 DIAGNOSIS — R059 Cough, unspecified: Secondary | ICD-10-CM

## 2016-12-02 DIAGNOSIS — F17200 Nicotine dependence, unspecified, uncomplicated: Secondary | ICD-10-CM

## 2016-12-02 DIAGNOSIS — R062 Wheezing: Secondary | ICD-10-CM | POA: Diagnosis not present

## 2016-12-02 DIAGNOSIS — R05 Cough: Secondary | ICD-10-CM

## 2016-12-02 MED ORDER — ALBUTEROL SULFATE HFA 108 (90 BASE) MCG/ACT IN AERS
2.0000 | INHALATION_SPRAY | Freq: Four times a day (QID) | RESPIRATORY_TRACT | 0 refills | Status: DC | PRN
Start: 2016-12-02 — End: 2017-05-29

## 2016-12-02 MED ORDER — FLUTICASONE FUROATE-VILANTEROL 200-25 MCG/INH IN AEPB: 1.0000 | INHALATION_SPRAY | Freq: Every day | RESPIRATORY_TRACT | 0 refills | Status: DC

## 2016-12-02 NOTE — Progress Notes (Signed)
Subjective: Chief Complaint  Patient presents with  . Wheezing    wheezing , chest pain  and tightness in chest , heaviness in chest  x2 days    Here for chest tightness, heaviness in chest wheezing x 2 days.  Has had a few nights of chills.  This morning awoke wheezing.  Has some discomfort to left shoulder blade.  No arm paresthesias, no sweats, no nausea.  Has had some dizziness. Didn't sleep well last night.   Has had some sneezing, some ear discomfort, but not a lot of congestion.  Feels like she is coming down with illness.   Does have sick contacts.   Using nothing for symptoms.  Has been seeing neurology, has had some recent botox injections and headaches are much improved.  Does some times take OTC allergy pill at bedtime, but not daily.  No other aggravating or relieving factors. No other complaint.  Past Medical History:  Diagnosis Date  . Anticardiolipin antibody positive   . HSV-2 (herpes simplex virus 2) infection   . Hyperlipidemia   . Hypertension    diet controlled  . Kidney stone   . Migraine   . Renal calculus, right   . Sleep apnea   . TMJ (dislocation of temporomandibular joint)    Current Outpatient Prescriptions on File Prior to Visit  Medication Sig Dispense Refill  . atorvastatin (LIPITOR) 20 MG tablet Take 1 tablet (20 mg total) by mouth at bedtime. 90 tablet 4  . Diclofenac Potassium (CAMBIA) 50 MG PACK Take 50 mg by mouth as needed (for migraine; max 1 packet per day). 9 each 3  . ibuprofen (ADVIL,MOTRIN) 200 MG tablet Take 200 mg by mouth as needed for pain. Reported on 11/23/2015    . levonorgestrel (MIRENA) 20 MCG/24HR IUD 1 each by Intrauterine route once.    . metoprolol (LOPRESSOR) 50 MG tablet TAKE 1 TABLET BY MOUTH 2 TIMES DAILY. 60 tablet 2  . Multiple Vitamin (MULTIVITAMIN WITH MINERALS) TABS Take 1 tablet by mouth daily.    . rizatriptan (MAXALT-MLT) 10 MG disintegrating tablet Take 1 tablet (10 mg total) by mouth as needed for migraine. May  repeat in 2 hours if needed 9 tablet 11  . topiramate (TOPAMAX) 50 MG tablet Take 1 tablet (50 mg total) by mouth 2 (two) times daily. 60 tablet 12   No current facility-administered medications on file prior to visit.    Review of Systems Constitutional: -fever, -chills, -sweats, -unexpected weight change,-fatigue ENT: +runny nose, +ear pain, -sore throat Cardiology:  -chest pain, -palpitations, -edema Respiratory: +cough, +shortness of breath, +wheezing Gastroenterology: -abdominal pain, -nausea, -vomiting, -diarrhea, -constipation Hematology: -bleeding or bruising problems Musculoskeletal: -arthralgias, -myalgias, -joint swelling, -back pain Ophthalmology: -vision changes Urology: -dysuria, -difficulty urinating, -hematuria, -urinary frequency, -urgency Neurology: -headache, -weakness, -tingling, -numbness    Objective: BP 108/74   Pulse 78   Temp 98.5 F (36.9 C)   SpO2 98%    General appearance: Alert, WD/WN, no distress                             Skin: warm, no rash, no diaphoresis                           Head: no sinus tenderness  Eyes: conjunctiva normal, corneas clear, PERRLA                            Ears: flat TMs, external ear canals normal                          Nose: septum midline, turbinates swollen, with erythema and no discharge             Mouth/throat: MMM, tongue normal, mild pharyngeal erythema                           Neck: supple, no adenopathy, no thyromegaly, non tender                          Heart: RRR, normal S1, S2, no murmurs                         Lungs: +bronchial breath sounds, no rhonchi, no wheezes, no rales                Extremities: no edema, non tender        Assessment: Encounter Diagnoses  Name Primary?  . Wheezing Yes  . Cough   . Smoker   . Bronchitis, allergic, unspecified asthma severity, uncomplicated     Plan: reviewed PFTs.    Symptoms and exam suggest allergy induced bronchitis.   Begin trial of Breo inhaler, albuterol prn, discussed proper use of medication.   Cal report within a week.   QUIT SMOKING!  counseled on tobacco cessation.  If worse or not improving within a few days let me know.  otherwise c/t daily OTC antihistamine QHS, stop breo after 3-4 weeks, c/t albuterol prn.

## 2016-12-04 DIAGNOSIS — G4733 Obstructive sleep apnea (adult) (pediatric): Secondary | ICD-10-CM | POA: Diagnosis not present

## 2016-12-08 ENCOUNTER — Encounter: Payer: Self-pay | Admitting: Neurology

## 2016-12-22 ENCOUNTER — Telehealth: Payer: Self-pay

## 2016-12-22 NOTE — Telephone Encounter (Signed)
Received fax from [facility]. Pt has not returned calls to schedule appt.

## 2017-01-03 DIAGNOSIS — G4733 Obstructive sleep apnea (adult) (pediatric): Secondary | ICD-10-CM | POA: Diagnosis not present

## 2017-01-04 ENCOUNTER — Encounter: Payer: Self-pay | Admitting: Gynecology

## 2017-01-31 DIAGNOSIS — H524 Presbyopia: Secondary | ICD-10-CM | POA: Diagnosis not present

## 2017-02-14 MED FILL — TOPIRAMATE 50 MG TABLET: 50 | 30 days supply | Qty: 60 | Fill #4

## 2017-03-03 ENCOUNTER — Other Ambulatory Visit: Payer: Self-pay | Admitting: Family Medicine

## 2017-03-03 MED ORDER — ERYTHROMYCIN 5 MG/GM OP OINT: 1.0000 "application " | TOPICAL_OINTMENT | Freq: Three times a day (TID) | OPHTHALMIC | 0 refills | Status: DC

## 2017-03-03 MED FILL — ERYTHROMYCIN EYE OINTMENT: 5 | 15 days supply | Qty: 4 | Fill #0

## 2017-03-03 NOTE — Progress Notes (Signed)
   Subjective:    Patient ID: Kelly [name], female    DOB: May 30, 1966, 51 y.o.   MRN: [id number]  HPI  She is having left eye irritation and is concerned she is getting pink eye.  No eye pain, redness or foreign body sensation. She is going out of town and is worried this could get worse. Requests antibiotic drop in case.   Review of Systems     Objective:   Physical Exam        Assessment & Plan:   She will only use ointment if symptoms worsen.

## 2017-03-06 DIAGNOSIS — G4733 Obstructive sleep apnea (adult) (pediatric): Secondary | ICD-10-CM | POA: Diagnosis not present

## 2017-04-17 ENCOUNTER — Other Ambulatory Visit: Payer: Self-pay | Admitting: Medical

## 2017-04-17 MED FILL — METOPROLOL TARTRATE 50 MG T: 50 | 90 days supply | Qty: 180 | Fill #0

## 2017-04-17 MED FILL — TOPIRAMATE 50 MG TABLET: 50 | 30 days supply | Qty: 60 | Fill #5

## 2017-04-17 MED FILL — ATORVASTATIN 20 MG TABLET: 20 | 90 days supply | Qty: 90 | Fill #3

## 2017-05-18 ENCOUNTER — Encounter: Payer: 59 | Admitting: Obstetrics & Gynecology

## 2017-05-29 ENCOUNTER — Ambulatory Visit (INDEPENDENT_AMBULATORY_CARE_PROVIDER_SITE_OTHER): Payer: 59 | Admitting: Obstetrics & Gynecology

## 2017-05-29 ENCOUNTER — Encounter: Payer: Self-pay | Admitting: Obstetrics & Gynecology

## 2017-05-29 VITALS — BP 128/84 | Ht 63.25 in | Wt 183.0 lb

## 2017-05-29 DIAGNOSIS — Z720 Tobacco use: Secondary | ICD-10-CM

## 2017-05-29 DIAGNOSIS — E661 Drug-induced obesity: Secondary | ICD-10-CM

## 2017-05-29 DIAGNOSIS — Z113 Encounter for screening for infections with a predominantly sexual mode of transmission: Secondary | ICD-10-CM

## 2017-05-29 DIAGNOSIS — G43709 Chronic migraine without aura, not intractable, without status migrainosus: Secondary | ICD-10-CM

## 2017-05-29 DIAGNOSIS — K640 First degree hemorrhoids: Secondary | ICD-10-CM

## 2017-05-29 DIAGNOSIS — Z975 Presence of (intrauterine) contraceptive device: Secondary | ICD-10-CM | POA: Diagnosis not present

## 2017-05-29 DIAGNOSIS — Z1151 Encounter for screening for human papillomavirus (HPV): Secondary | ICD-10-CM | POA: Diagnosis not present

## 2017-05-29 DIAGNOSIS — Z6832 Body mass index (BMI) 32.0-32.9, adult: Secondary | ICD-10-CM

## 2017-05-29 DIAGNOSIS — Z01419 Encounter for gynecological examination (general) (routine) without abnormal findings: Secondary | ICD-10-CM | POA: Diagnosis not present

## 2017-05-29 MED ORDER — PHENTERMINE HCL 37.5 MG PO CAPS: 37.5000 mg | ORAL_CAPSULE | ORAL | 2 refills | Status: DC

## 2017-05-29 MED ORDER — NYSTATIN-TRIAMCINOLONE 100000-0.1 UNIT/GM-% EX OINT: 1.0000 "application " | TOPICAL_OINTMENT | Freq: Two times a day (BID) | CUTANEOUS | 5 refills | Status: DC

## 2017-05-29 MED FILL — TOPIRAMATE 50 MG TABLET: 50 | 30 days supply | Qty: 60 | Fill #6

## 2017-05-29 MED FILL — NYSTATIN-TRIAMCINOLONE OINT: 100000-0.1 | 15 days supply | Qty: 30 | Fill #0

## 2017-05-29 MED FILL — PHENTERMINE 37.5 MG TABLET: 37.5 | 30 days supply | Qty: 30 | Fill #0

## 2017-05-29 NOTE — Progress Notes (Signed)
[place] Dec 15, 1965 [id number]   History:    51 y.o.  [id number]  Single.  [employer] Employee   RP:  Established patient presenting for annual gyn exam   HPI:  Well on Mirena IUD x 2014.  No pelvic pain.  No abnormal bleeding.  No Menopausal Sx.  Breasts wnl.  Mictions/BMs wnl.  BMI 32.16.  Would like to loose weight.  Active lifestyle.  Cigarette smoker 1/2 ppd.  Past medical history,surgical history, family history and social history were all reviewed and documented in the EPIC chart.  Gynecologic History No LMP recorded. Patient is not currently having periods (Reason: IUD). Contraception: Mirena  IUD x 2014 Last Pap: 2017. Results were: normal Last mammogram: 2017. Results were: normal Colono: 2009.  Father Colon Ca.  Obstetric History OB History  Gravida Para Term Preterm AB Living  '4 3 3   1 3  '$ SAB TAB Ectopic Multiple Live Births          3    # Outcome Date GA Lbr Len/2nd Weight Sex Delivery Anes PTL Lv  4 AB           3 Term     F CS-Unspec  N LIV  2 Term     F CS-Unspec  N LIV  1 Term     M CS-Unspec  N LIV       ROS: A ROS was performed and pertinent positives and negatives are included in the history.  GENERAL: No fevers or chills. HEENT: No change in vision, no earache, sore throat or sinus congestion. NECK: No pain or stiffness. CARDIOVASCULAR: No chest pain or pressure. No palpitations. PULMONARY: No shortness of breath, cough or wheeze. GASTROINTESTINAL: No abdominal pain, nausea, vomiting or diarrhea, melena or bright red blood per rectum. GENITOURINARY: No urinary frequency, urgency, hesitancy or dysuria. MUSCULOSKELETAL: No joint or muscle pain, no back pain, no recent trauma. DERMATOLOGIC: No rash, no itching, no lesions. ENDOCRINE: No polyuria, polydipsia, no heat or cold intolerance. No recent change in weight. HEMATOLOGICAL: No anemia or easy bruising or bleeding. NEUROLOGIC: No headache, seizures, numbness, tingling or weakness. PSYCHIATRIC: No depression,  no loss of interest in normal activity or change in sleep pattern.     Exam:   BP 128/84   Ht 5' 3.25" (1.607 m)   Wt 183 lb (83 kg)   BMI 32.16 kg/m   Body mass index is 32.16 kg/m.  General appearance : Well developed well nourished female. No acute distress HEENT: Eyes: no retinal hemorrhage or exudates,  Neck supple, trachea midline, no carotid bruits, no thyroidmegaly Lungs: Clear to auscultation, no rhonchi or wheezes, or rib retractions  Heart: Regular rate and rhythm, no murmurs or gallops Breast:Examined in sitting and supine position were symmetrical in appearance, no palpable masses or tenderness,  no skin retraction, no nipple inversion, no nipple discharge, no skin discoloration, no axillary or supraclavicular lymphadenopathy Abdomen: no palpable masses or tenderness, no rebound or guarding Extremities: no edema or skin discoloration or tenderness  Pelvic: Vulva normal  Bartholin, Urethra, Skene Glands: Within normal limits             Vagina: No gross lesions or discharge  Cervix: No gross lesions or discharge  Uterus  AV, normal size, shape and consistency, non-tender and mobile  Adnexa  Without masses or tenderness  Anus and perineum  normal     Assessment/Plan:  51 y.o. female for annual exam   1. Encounter for gynecological  examination without abnormal finding Normal gyn exam.  Pap normal 2017, will repeat next year.  Breasts wnl.  Mammo normal 2017, will schedule. - CBC - Comp Met (CMET); Future - Lipid Profile; Future - TSH - Vitamin D 1,25 dihydroxy - HgB A1c  2. IUD contraception Well on Mirena IUD x 2014.  In good position.  Will change in 2019.  3. 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  Usage/Risks/Benefits reviewed.  Low carb/calorie diet and regular physical activity discussed.  4. Chronic migraine without aura without status migrainosus, not  intractable Follow with Fam MD.  5. Screen for STD (sexually transmitted disease) Recommend condom use. - HIV antibody (with reflex) - Hepatitis B Surface AntiGEN - Hepatitis C Antibody - RPR  6. Grade I hemorrhoids Nystatin/Triamcinolone prescribed.  Stool softeners as needed.  7. Tobacco abuse Strongly recommended to reduce consumption and eventually quit smoking.  8. Special screening examination for human papillomavirus (HPV)  - Pap IG, CT/NG NAA, and HPV (high risk)   Counseling on above issues >50% x 10 minutes   [name] MD, 2:18 PM 05/29/2017

## 2017-05-31 LAB — PAP IG, CT-NG NAA, HPV HIGH-RISK
C. trachomatis RNA, TMA: NOT DETECTED
HPV DNA High Risk: NOT DETECTED
N. gonorrhoeae RNA, TMA: NOT DETECTED

## 2017-06-01 LAB — HEPATITIS C ANTIBODY
Hepatitis C Ab: NONREACTIVE
SIGNAL TO CUT-OFF: 0.01 (ref <1.00)

## 2017-06-01 LAB — HEMOGLOBIN A1C
Hgb A1c MFr Bld: 5.4 % of total Hgb (ref <5.7)
Mean Plasma Glucose: 108 (calc)
eAG (mmol/L): 6 (calc)

## 2017-06-01 LAB — HIV ANTIBODY (ROUTINE TESTING W REFLEX): HIV 1&2 Ab, 4th Generation: NONREACTIVE

## 2017-06-01 LAB — VITAMIN D 1,25 DIHYDROXY
Vitamin D 1, 25 (OH)2 Total: 50 pg/mL (ref 18–72)
Vitamin D2 1, 25 (OH)2: <8 pg/mL
Vitamin D3 1, 25 (OH)2: 50 pg/mL

## 2017-06-01 LAB — CBC
HCT: 34.9 % — ABNORMAL LOW (ref 35.0–45.0)
Hemoglobin: 12.3 g/dL (ref 11.7–15.5)
MCH: 31.1 pg (ref 27.0–33.0)
MCHC: 35.2 g/dL (ref 32.0–36.0)
MCV: 88.4 fL (ref 80.0–100.0)
MPV: 9.8 fL (ref 7.5–12.5)
Platelets: 294 10*3/uL (ref 140–400)
RBC: 3.95 10*6/uL (ref 3.80–5.10)
RDW: 12.6 % (ref 11.0–15.0)
WBC: 10 10*3/uL (ref 3.8–10.8)

## 2017-06-01 LAB — TSH: TSH: 1.58 mIU/L

## 2017-06-01 LAB — RPR: RPR Ser Ql: NONREACTIVE

## 2017-06-01 LAB — HEPATITIS B SURFACE ANTIGEN: Hepatitis B Surface Ag: NONREACTIVE

## 2017-06-04 NOTE — Patient Instructions (Signed)
1. Encounter for gynecological examination without abnormal finding Normal gyn exam.  Pap normal 2017, will repeat next year.  Breasts wnl.  Mammo normal 2017, will schedule. - CBC - Comp Met (CMET); Future - Lipid Profile; Future - TSH - Vitamin D 1,25 dihydroxy - HgB A1c  2. IUD contraception Well on Mirena IUD x 2014.  In good position.  Will change in 2019.  3. 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  Usage/Risks/Benefits reviewed.  Low carb/calorie diet and regular physical activity discussed.  4. Chronic migraine without aura without status migrainosus, not intractable Follow with Fam MD.  5. Screen for STD (sexually transmitted disease) Recommend condom use. - HIV antibody (with reflex) - Hepatitis B Surface AntiGEN - Hepatitis C Antibody - RPR  6. Grade I hemorrhoids Nystatin/Triamcinolone prescribed.  Stool softeners as needed.  7. Tobacco abuse Strongly recommended to reduce consumption and eventually quit smoking.  8. Special screening examination for human papillomavirus (HPV)  - Pap IG, CT/NG NAA, and HPV (high risk)  Kelly [name], it was a pleasure meeting you today!  I will inform you of your results as soon as available.  Health Maintenance, Female Adopting a healthy lifestyle and getting preventive care can go a long way to promote health and wellness. Talk with your health care provider about what schedule of regular examinations is right for you. This is a good chance for you to check in with your provider about disease prevention and staying healthy. In between checkups, there are plenty of things you can do on your own. Experts have done a lot of research about which lifestyle changes and preventive measures are most likely to keep you healthy. Ask your health care provider for more information. Weight and diet Eat a healthy diet  Be sure to include plenty of  vegetables, fruits, low-fat dairy products, and lean protein.  Do not eat a lot of foods high in solid fats, added sugars, or salt.  Get regular exercise. This is one of the most important things you can do for your health. ? Most adults should exercise for at least 150 minutes each week. The exercise should increase your heart rate and make you sweat (moderate-intensity exercise). ? Most adults should also do strengthening exercises at least twice a week. This is in addition to the moderate-intensity exercise.  Maintain a healthy weight  Body mass index (BMI) is a measurement that can be used to identify possible weight problems. It estimates body fat based on height and weight. Your health care provider can help determine your BMI and help you achieve or maintain a healthy weight.  For females 50 years of age and older: ? A BMI below 18.5 is considered underweight. ? A BMI of 18.5 to 24.9 is normal. ? A BMI of 25 to 29.9 is considered overweight. ? A BMI of 30 and above is considered obese.  Watch levels of cholesterol and blood lipids  You should start having your blood tested for lipids and cholesterol at 50 years of age, then have this test every 5 years.  You may need to have your cholesterol levels checked more often if: ? Your lipid or cholesterol levels are high. ? You are older than 50 years of age. ? You are at high risk for heart disease.  Cancer screening Lung Cancer  Lung cancer screening is recommended for adults 68-17 years old who are at high risk for  lung cancer because of a history of smoking.  A yearly low-dose CT scan of the lungs is recommended for people who: ? Currently smoke. ? Have quit within the past 15 years. ? Have at least a 30-pack-year history of smoking. A pack year is smoking an average of one pack of cigarettes a day for 1 year.  Yearly screening should continue until it has been 15 years since you quit.  Yearly screening should stop if you  develop a health problem that would prevent you from having lung cancer treatment.  Breast Cancer  Practice breast self-awareness. This means understanding how your breasts normally appear and feel.  It also means doing regular breast self-exams. Let your health care provider know about any changes, no matter how small.  If you are in your 20s or 30s, you should have a clinical breast exam (CBE) by a health care provider every 1-3 years as part of a regular health exam.  If you are 64 or older, have a CBE every year. Also consider having a breast X-ray (mammogram) every year.  If you have a family history of breast cancer, talk to your health care provider about genetic screening.  If you are at high risk for breast cancer, talk to your health care provider about having an MRI and a mammogram every year.  Breast cancer gene (BRCA) assessment is recommended for women who have family members with BRCA-related cancers. BRCA-related cancers include: ? Breast. ? Ovarian. ? Tubal. ? Peritoneal cancers.  Results of the assessment will determine the need for genetic counseling and BRCA1 and BRCA2 testing.  Cervical Cancer Your health care provider may recommend that you be screened regularly for cancer of the pelvic organs (ovaries, uterus, and vagina). This screening involves a pelvic examination, including checking for microscopic changes to the surface of your cervix (Pap test). You may be encouraged to have this screening done every 3 years, beginning at age 76.  For women ages 54-65, health care providers may recommend pelvic exams and Pap testing every 3 years, or they may recommend the Pap and pelvic exam, combined with testing for human papilloma virus (HPV), every 5 years. Some types of HPV increase your risk of cervical cancer. Testing for HPV may also be done on women of any age with unclear Pap test results.  Other health care providers may not recommend any screening for nonpregnant  women who are considered low risk for pelvic cancer and who do not have symptoms. Ask your health care provider if a screening pelvic exam is right for you.  If you have had past treatment for cervical cancer or a condition that could lead to cancer, you need Pap tests and screening for cancer for at least 20 years after your treatment. If Pap tests have been discontinued, your risk factors (such as having a new sexual partner) need to be reassessed to determine if screening should resume. Some women have medical problems that increase the chance of getting cervical cancer. In these cases, your health care provider may recommend more frequent screening and Pap tests.  Colorectal Cancer  This type of cancer can be detected and often prevented.  Routine colorectal cancer screening usually begins at 50 years of age and continues through 50 years of age.  Your health care provider may recommend screening at an earlier age if you have risk factors for colon cancer.  Your health care provider may also recommend using home test kits to check for hidden blood in  the stool.  A small camera at the end of a tube can be used to examine your colon directly (sigmoidoscopy or colonoscopy). This is done to check for the earliest forms of colorectal cancer.  Routine screening usually begins at age 41.  Direct examination of the colon should be repeated every 5-10 years through 50 years of age. However, you may need to be screened more often if early forms of precancerous polyps or small growths are found.  Skin Cancer  Check your skin from head to toe regularly.  Tell your health care provider about any new moles or changes in moles, especially if there is a change in a mole's shape or color.  Also tell your health care provider if you have a mole that is larger than the size of a pencil eraser.  Always use sunscreen. Apply sunscreen liberally and repeatedly throughout the day.  Protect yourself by  wearing long sleeves, pants, a wide-brimmed hat, and sunglasses whenever you are outside.  Heart disease, diabetes, and high blood pressure  High blood pressure causes heart disease and increases the risk of stroke. High blood pressure is more likely to develop in: ? People who have blood pressure in the high end of the normal range (130-139/85-89 mm Hg). ? People who are overweight or obese. ? People who are African American.  If you are 9-58 years of age, have your blood pressure checked every 3-5 years. If you are 74 years of age or older, have your blood pressure checked every year. You should have your blood pressure measured twice-once when you are at a hospital or clinic, and once when you are not at a hospital or clinic. Record the average of the two measurements. To check your blood pressure when you are not at a hospital or clinic, you can use: ? An automated blood pressure machine at a pharmacy. ? A home blood pressure monitor.  If you are between 49 years and 65 years old, ask your health care provider if you should take aspirin to prevent strokes.  Have regular diabetes screenings. This involves taking a blood sample to check your fasting blood sugar level. ? If you are at a normal weight and have a low risk for diabetes, have this test once every three years after 50 years of age. ? If you are overweight and have a high risk for diabetes, consider being tested at a younger age or more often. Preventing infection Hepatitis B  If you have a higher risk for hepatitis B, you should be screened for this virus. You are considered at high risk for hepatitis B if: ? You were born in a country where hepatitis B is common. Ask your health care provider which countries are considered high risk. ? Your parents were born in a high-risk country, and you have not been immunized against hepatitis B (hepatitis B vaccine). ? You have HIV or AIDS. ? You use needles to inject street drugs. ? You  live with someone who has hepatitis B. ? You have had sex with someone who has hepatitis B. ? You get hemodialysis treatment. ? You take certain medicines for conditions, including cancer, organ transplantation, and autoimmune conditions.  Hepatitis C  Blood testing is recommended for: ? Everyone born from [birth year] through 1964. ? Anyone with known risk factors for hepatitis C.  Sexually transmitted infections (STIs)  You should be screened for sexually transmitted infections (STIs) including gonorrhea and chlamydia if: ? You are sexually active and are  younger than 50 years of age. ? You are older than 50 years of age and your health care provider tells you that you are at risk for this type of infection. ? Your sexual activity has changed since you were last screened and you are at an increased risk for chlamydia or gonorrhea. Ask your health care provider if you are at risk.  If you do not have HIV, but are at risk, it may be recommended that you take a prescription medicine daily to prevent HIV infection. This is called pre-exposure prophylaxis (PrEP). You are considered at risk if: ? You are sexually active and do not regularly use condoms or know the HIV status of your partner(s). ? You take drugs by injection. ? You are sexually active with a partner who has HIV.  Talk with your health care provider about whether you are at high risk of being infected with HIV. If you choose to begin PrEP, you should first be tested for HIV. You should then be tested every 3 months for as long as you are taking PrEP. Pregnancy  If you are premenopausal and you may become pregnant, ask your health care provider about preconception counseling.  If you may become pregnant, take 400 to 800 micrograms (mcg) of folic acid every day.  If you want to prevent pregnancy, talk to your health care provider about birth control (contraception). Osteoporosis and menopause  Osteoporosis is a disease in which the  bones lose minerals and strength with aging. This can result in serious bone fractures. Your risk for osteoporosis can be identified using a bone density scan.  If you are 68 years of age or older, or if you are at risk for osteoporosis and fractures, ask your health care provider if you should be screened.  Ask your health care provider whether you should take a calcium or vitamin D supplement to lower your risk for osteoporosis.  Menopause may have certain physical symptoms and risks.  Hormone replacement therapy may reduce some of these symptoms and risks. Talk to your health care provider about whether hormone replacement therapy is right for you. Follow these instructions at home:  Schedule regular health, dental, and eye exams.  Stay current with your immunizations.  Do not use any tobacco products including cigarettes, chewing tobacco, or electronic cigarettes.  If you are pregnant, do not drink alcohol.  If you are breastfeeding, limit how much and how often you drink alcohol.  Limit alcohol intake to no more than 1 drink per day for nonpregnant women. One drink equals 12 ounces of beer, 5 ounces of wine, or 1 ounces of hard liquor.  Do not use street drugs.  Do not share needles.  Ask your health care provider for help if you need support or information about quitting drugs.  Tell your health care provider if you often feel depressed.  Tell your health care provider if you have ever been abused or do not feel safe at home. This information is not intended to replace advice given to you by your health care provider. Make sure you discuss any questions you have with your health care provider. Document Released: 02/21/2011 Document Revised: 01/14/2016 Document Reviewed: 05/12/2015 Elsevier Interactive Patient Education  [employer].

## 2017-06-14 ENCOUNTER — Other Ambulatory Visit (INDEPENDENT_AMBULATORY_CARE_PROVIDER_SITE_OTHER): Payer: 59

## 2017-06-14 DIAGNOSIS — Z23 Encounter for immunization: Secondary | ICD-10-CM | POA: Diagnosis not present

## 2017-07-26 ENCOUNTER — Other Ambulatory Visit: Payer: Self-pay | Admitting: Diagnostic Neuroimaging

## 2017-07-26 ENCOUNTER — Other Ambulatory Visit: Payer: Self-pay | Admitting: Medical

## 2017-07-26 MED FILL — METOPROLOL TARTRATE 50 MG T: 50 | 90 days supply | Qty: 180 | Fill #0

## 2017-07-26 MED FILL — PHENTERMINE 37.5 MG TABLET: 37.5 | 30 days supply | Qty: 30 | Fill #1

## 2017-07-27 ENCOUNTER — Other Ambulatory Visit: Payer: Self-pay

## 2017-07-27 NOTE — Telephone Encounter (Signed)
Sent to [place] in error. Patient has just filled 90 days of this Rx. You recently saw her for CE. Do you want her follow up with PCP as well?

## 2017-07-27 NOTE — Telephone Encounter (Signed)
[facility] for 1 refill but needs primary care to order this.  Dr Dellis Filbert saw for annual and you can make sure she is agreeable.

## 2017-07-28 MED ORDER — ATORVASTATIN CALCIUM 20 MG PO TABS: 20.0000 mg | ORAL_TABLET | Freq: Every day | ORAL | 1 refills | Status: DC

## 2017-07-28 MED FILL — ATORVASTATIN 20 MG TABLET: 20 | 90 days supply | Qty: 90 | Fill #0

## 2017-07-28 NOTE — Telephone Encounter (Signed)
I spoke with patient and informed her.

## 2017-07-28 NOTE — Telephone Encounter (Signed)
Yes, want her to see Family MD.

## 2017-07-31 ENCOUNTER — Other Ambulatory Visit: Payer: Self-pay | Admitting: Diagnostic Neuroimaging

## 2017-08-01 MED FILL — TOPIRAMATE 50 MG TABLET: 50 | 30 days supply | Qty: 60 | Fill #0

## 2017-08-21 ENCOUNTER — Encounter: Payer: Self-pay | Admitting: Obstetrics & Gynecology

## 2017-08-21 DIAGNOSIS — Z1231 Encounter for screening mammogram for malignant neoplasm of breast: Secondary | ICD-10-CM | POA: Diagnosis not present

## 2017-08-25 ENCOUNTER — Encounter: Payer: Self-pay | Admitting: Medical

## 2017-08-25 ENCOUNTER — Ambulatory Visit: Payer: 59 | Admitting: Medical

## 2017-08-25 VITALS — BP 118/72 | HR 78 | Temp 98.5°F | Wt 167.8 lb

## 2017-08-25 DIAGNOSIS — J111 Influenza due to unidentified influenza virus with other respiratory manifestations: Secondary | ICD-10-CM | POA: Diagnosis not present

## 2017-08-25 DIAGNOSIS — R6889 Other general symptoms and signs: Secondary | ICD-10-CM

## 2017-08-25 LAB — POC INFLUENZA A&B (BINAX/QUICKVUE)
Influenza A, POC: POSITIVE — AB
Influenza B, POC: NEGATIVE

## 2017-08-25 MED ORDER — OSELTAMIVIR PHOSPHATE 75 MG PO CAPS: 75.0000 mg | ORAL_CAPSULE | Freq: Two times a day (BID) | ORAL | 0 refills | Status: DC

## 2017-08-25 MED FILL — OSELTAMIVIR PHOS 75 MG CAP: 75 | 5 days supply | Qty: 10 | Fill #0

## 2017-08-25 NOTE — Progress Notes (Signed)
Subjective:  Kelly [name] is a 52 y.o. female who presents for illness.  She notes a 1-2-day history of sudden onset of illness, with chest congestion, cough, body aches, chills, some diarrhea yesterday, nausea, headache.  She denies runny nose, sinus pressure, rash.  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 She is using Mucinex Max. No other aggravating or relieving factors.  No other c/o.  The following portions of the patient's history were reviewed and updated as appropriate: allergies, current medications, past medical history, past social history and problem list.  ROS as in subjective   Past Medical History:  Diagnosis Date  . Anticardiolipin antibody positive   . HSV-2 (herpes simplex virus 2) infection   . Hyperlipidemia   . Hypertension    diet controlled  . Kidney stone   . Migraine   . Renal calculus, right   . Sleep apnea   . TMJ (dislocation of temporomandibular joint)    Current Outpatient Medications on File Prior to Visit  Medication Sig Dispense Refill  . atorvastatin (LIPITOR) 20 MG tablet Take 1 tablet (20 mg total) by mouth at bedtime. 90 tablet 1  . Diclofenac Potassium (CAMBIA) 50 MG PACK Take 50 mg by mouth as needed (for migraine; max 1 packet per day). 9 each 3  . ibuprofen (ADVIL,MOTRIN) 200 MG tablet Take 200 mg by mouth as needed for pain. Reported on 11/23/2015    . levonorgestrel (MIRENA) 20 MCG/24HR IUD 1 each by Intrauterine route once.    . metoprolol tartrate (LOPRESSOR) 50 MG tablet TAKE 1 TABLET BY MOUTH 2 TIMES DAILY. 60 tablet 2  . Multiple Vitamin (MULTIVITAMIN WITH MINERALS) TABS Take 1 tablet by mouth daily.    . phentermine 37.5 MG capsule Take 1 capsule (37.5 mg total) by mouth every morning. 30 capsule 2  . topiramate (TOPAMAX) 50 MG tablet TAKE 1 TABLET BY MOUTH TWICE DAILY 60 tablet 0   No current facility-administered medications on file prior to visit.       Objective: BP 118/72   Pulse 78   Temp 98.5 F (36.9 C)   Wt 167 lb 12.8 oz (76.1 kg)   SpO2 99%   BMI 29.49 kg/m   General:somewhat Ill-appearing, well-developed, well-nourished Skin: warm, dry HEENT: Nose with mild erythema, clear conjunctiva, TMs pearly, no sinus tenderness, pharynx with erythema, no exudates Neck: Supple, non tender, shotty cervical adenopathy Heart: Regular rate and rhythm, normal S1, S2, no murmurs Lungs: Clear to auscultation bilaterally, no wheezes, rales, rhonchi Abdomen: Non tender non distended     Assessment: Encounter Diagnoses  Name Primary?  . Influenza Yes  . Flu-like symptoms      Plan: Prescription given for Tamiflu, discussed risks/benefits of medication.    Discussed diagnosis of influenza. Discussed supportive care including rest, hydration, OTC Tylenol or NSAID for fever, aches, and malaise.  Discussed period of contagion, self quarantine at home away from others to avoid spread of disease, discussed means of transmission, and possible complications including pneumonia.  If worse or not improving within the next 4-5 days, then call or return.  Patient voiced understanding of diagnosis, recommendations, and treatment plan.  I called out a round of Tamiflu for her daughter who is also a patient of mine as they were in the same bed last night so she likely has been exposed

## 2017-10-05 ENCOUNTER — Other Ambulatory Visit: Payer: Self-pay | Admitting: Diagnostic Neuroimaging

## 2017-10-05 MED FILL — PHENTERMINE 37.5 MG TABLET: 37.5 | 30 days supply | Qty: 30 | Fill #2

## 2017-10-06 ENCOUNTER — Other Ambulatory Visit: Payer: 59

## 2017-10-06 ENCOUNTER — Other Ambulatory Visit: Payer: Self-pay | Admitting: Medical

## 2017-10-06 DIAGNOSIS — Z01419 Encounter for gynecological examination (general) (routine) without abnormal findings: Secondary | ICD-10-CM | POA: Diagnosis not present

## 2017-10-06 NOTE — Addendum Note (Signed)
Addended by: Tyrone Apple on: 10/06/2017 08:42 AM   Modules accepted: Orders

## 2017-10-07 LAB — LIPID PANEL W/O CHOL/HDL RATIO
Cholesterol, Total: 127 mg/dL (ref 100–199)
HDL: 50 mg/dL (ref >39)
LDL Calculated: 53 mg/dL (ref 0–99)
Triglycerides: 121 mg/dL (ref 0–149)
VLDL Cholesterol Cal: 24 mg/dL (ref 5–40)

## 2017-10-07 LAB — COMPREHENSIVE METABOLIC PANEL
ALT: 15 IU/L (ref 0–32)
AST: 14 IU/L (ref 0–40)
Albumin/Globulin Ratio: 1.9 (ref 1.2–2.2)
Albumin: 4.4 g/dL (ref 3.5–5.5)
Alkaline Phosphatase: 145 IU/L — ABNORMAL HIGH (ref 39–117)
BUN/Creatinine Ratio: 12 (ref 9–23)
BUN: 8 mg/dL (ref 6–24)
Bilirubin Total: 0.5 mg/dL (ref 0.0–1.2)
CO2: 22 mmol/L (ref 20–29)
Calcium: 9.3 mg/dL (ref 8.7–10.2)
Chloride: 105 mmol/L (ref 96–106)
Creatinine, Ser: 0.65 mg/dL (ref 0.57–1.00)
GFR calc Af Amer: 120 mL/min/{1.73_m2} (ref >59)
GFR calc non Af Amer: 104 mL/min/{1.73_m2} (ref >59)
Globulin, Total: 2.3 g/dL (ref 1.5–4.5)
Glucose: 90 mg/dL (ref 65–99)
Potassium: 3.9 mmol/L (ref 3.5–5.2)
Sodium: 141 mmol/L (ref 134–144)
Total Protein: 6.7 g/dL (ref 6.0–8.5)

## 2017-10-09 ENCOUNTER — Telehealth: Payer: Self-pay | Admitting: Family Medicine

## 2017-10-09 NOTE — Telephone Encounter (Signed)
[facility] req lab results, none to give at this time.

## 2017-10-12 ENCOUNTER — Ambulatory Visit: Payer: 59 | Admitting: Family Medicine

## 2017-10-12 DIAGNOSIS — J039 Acute tonsillitis, unspecified: Secondary | ICD-10-CM

## 2017-10-12 LAB — POC INFLUENZA A&B (BINAX/QUICKVUE)
Influenza A, POC: NEGATIVE
Influenza B, POC: NEGATIVE

## 2017-10-12 LAB — POCT RAPID STREP A (OFFICE): Rapid Strep A Screen: NEGATIVE

## 2017-10-12 NOTE — Progress Notes (Signed)
   Subjective:    Patient ID: Kelly [name], female    DOB: 1966/02/05, 52 y.o.   MRN: [id number]  HPI She complains of a one day history of right-sided sore throat, fever, malaise, myalgias and fatigue.   Review of Systems     Objective:   Physical Exam Alert and in no distress. Tympanic membranes and canals are normal. Pharyngeal area is normal, right tonsil does have a tonsil lift as well as some exudates, left tonsil is normal.. Neck is supple without adenopathy or thyromegaly. Cardiac exam shows a regular sinus rhythm without murmurs or gallops. Lungs are clear to auscultation. Strep screen and influenza test were negative       Assessment & Plan:  Tonsillitis Recommend supportive care.  Discussed the fact that if the right tonsil becomes more symptomatic, we will reevaluate for possible peritonsillar abscess.

## 2017-10-12 NOTE — Addendum Note (Signed)
Addended by: Elyse Jarvis on: 10/12/2017 05:18 PM   Modules accepted: Orders

## 2017-10-16 ENCOUNTER — Telehealth: Payer: Self-pay | Admitting: Medical

## 2017-10-16 ENCOUNTER — Other Ambulatory Visit: Payer: Self-pay | Admitting: Gynecology

## 2017-10-16 MED ORDER — AMOXICILLIN-POT CLAVULANATE 875-125 MG PO TABS: 1.0000 | ORAL_TABLET | Freq: Two times a day (BID) | ORAL | 0 refills | Status: DC

## 2017-10-16 MED FILL — AMOXICILLIN-POT CLAVULANATE: 875-125 | 10 days supply | Qty: 20 | Fill #0

## 2017-10-16 NOTE — Telephone Encounter (Signed)
Pt advised now has facial pain & headache, constant blowing nose and has blood when blows nose, upper teeth hurting. Thinks has sinus infection. Would like antibiotic called into [facility].  Thanks

## 2017-10-17 MED FILL — LIDOCAINE 5 % CRM: 5 | 15 days supply | Qty: 30 | Fill #0

## 2017-10-17 MED FILL — ACYCLOVIR 5 % OINT: 5 | 15 days supply | Qty: 30 | Fill #0

## 2017-10-19 ENCOUNTER — Other Ambulatory Visit: Payer: Self-pay | Admitting: Diagnostic Neuroimaging

## 2017-10-24 ENCOUNTER — Telehealth: Payer: Self-pay | Admitting: Medical

## 2017-10-24 MED ORDER — PREDNISONE 10 MG (48) PO TBPK: ORAL_TABLET | ORAL | 0 refills | Status: DC

## 2017-10-24 MED ORDER — AMOXICILLIN-POT CLAVULANATE 875-125 MG PO TABS: 1.0000 | ORAL_TABLET | Freq: Two times a day (BID) | ORAL | 0 refills | Status: DC

## 2017-10-24 MED FILL — AMOXICILLIN-POT CLAVULANATE: 875-125 | 10 days supply | Qty: 20 | Fill #0

## 2017-10-24 MED FILL — predniSONE 10 MG TABS: 10 | 12 days supply | Qty: 48 | Fill #0

## 2017-10-24 NOTE — Telephone Encounter (Signed)
Pt states sinuses are no better, pain in face, and teeth, doesn't think antibiotic worked all,  Still blowing thick mucous with blood, facial pain and pressure.  Ear pain started Friday, had Vickie look in ear and said now ear infection. This has been going on for over 2 weeks now.  Would like something else send into [facility] pharmacy.

## 2017-11-06 ENCOUNTER — Encounter: Payer: Self-pay | Admitting: Medical

## 2017-11-08 ENCOUNTER — Other Ambulatory Visit: Payer: Self-pay | Admitting: Medical

## 2017-11-08 MED ORDER — TOPIRAMATE 50 MG PO TABS: 50.0000 mg | ORAL_TABLET | Freq: Two times a day (BID) | ORAL | 1 refills | Status: DC

## 2017-11-09 ENCOUNTER — Other Ambulatory Visit: Payer: Self-pay | Admitting: Medical

## 2017-11-09 ENCOUNTER — Other Ambulatory Visit: Payer: Self-pay | Admitting: Obstetrics & Gynecology

## 2017-11-09 MED FILL — ATORVASTATIN 20 MG TABLET: 20 | 90 days supply | Qty: 90 | Fill #1

## 2017-11-09 MED FILL — TOPIRAMATE 50 MG TABLET: 50 | 30 days supply | Qty: 60 | Fill #0

## 2017-11-09 NOTE — Telephone Encounter (Signed)
Originally prescribed 05/29/17 with 2 refills.

## 2017-11-09 NOTE — Telephone Encounter (Signed)
Please advise of med. See message below

## 2017-11-09 NOTE — Telephone Encounter (Signed)
Pt has been getting bp med from you. No recent visit for HTN is it okay to refill? bp has been ok -normal

## 2017-11-10 MED FILL — METOPROLOL TARTRATE 50 MG T: 50 | 90 days supply | Qty: 180 | Fill #0

## 2017-11-10 NOTE — Telephone Encounter (Signed)
Patient needs to schedule a visit with me, check weight and BP to reassess treatment management.

## 2017-11-30 ENCOUNTER — Encounter: Payer: Self-pay | Admitting: Gastroenterology

## 2017-12-05 DIAGNOSIS — K601 Chronic anal fissure: Secondary | ICD-10-CM | POA: Diagnosis not present

## 2017-12-05 DIAGNOSIS — Z1211 Encounter for screening for malignant neoplasm of colon: Secondary | ICD-10-CM | POA: Diagnosis not present

## 2017-12-05 DIAGNOSIS — Z800 Family history of malignant neoplasm of digestive organs: Secondary | ICD-10-CM | POA: Diagnosis not present

## 2017-12-05 DIAGNOSIS — K625 Hemorrhage of anus and rectum: Secondary | ICD-10-CM | POA: Diagnosis not present

## 2017-12-05 DIAGNOSIS — R748 Abnormal levels of other serum enzymes: Secondary | ICD-10-CM | POA: Diagnosis not present

## 2017-12-06 ENCOUNTER — Other Ambulatory Visit: Payer: Self-pay | Admitting: Medical

## 2017-12-06 ENCOUNTER — Other Ambulatory Visit: Payer: Self-pay | Admitting: Gastroenterology

## 2017-12-06 ENCOUNTER — Other Ambulatory Visit: Payer: Self-pay | Admitting: Obstetrics & Gynecology

## 2017-12-06 DIAGNOSIS — R748 Abnormal levels of other serum enzymes: Secondary | ICD-10-CM

## 2017-12-06 MED FILL — LIDOCAINE 5 % CRM: 5 | 15 days supply | Qty: 30 | Fill #1

## 2017-12-06 MED FILL — TOPIRAMATE 50 MG TABLET: 50 | 30 days supply | Qty: 60 | Fill #1

## 2017-12-06 MED FILL — NYSTATIN-TRIAMCINOLONE OINT: 100000-0.1 | 15 days supply | Qty: 30 | Fill #1

## 2017-12-07 NOTE — Telephone Encounter (Signed)
Please make sure patient establishes with a Family MD.  Cardiac risk is too high, needs a Fam MD.  Lipitor to be prescribed by Fam MD.

## 2017-12-07 NOTE — Telephone Encounter (Signed)
Left detailed message for patient advised to importance of seeing PCP for Lipitor Rx.

## 2017-12-11 MED FILL — HYOSCYAMINE 0.125 MG TAB SL: 0.125 | 20 days supply | Qty: 120 | Fill #0

## 2017-12-13 ENCOUNTER — Other Ambulatory Visit: Payer: Self-pay

## 2017-12-13 ENCOUNTER — Other Ambulatory Visit: Payer: Self-pay | Admitting: Obstetrics & Gynecology

## 2017-12-13 MED ORDER — ATORVASTATIN CALCIUM 20 MG PO TABS: 20.0000 mg | ORAL_TABLET | Freq: Every day | ORAL | 1 refills | Status: DC

## 2017-12-14 ENCOUNTER — Ambulatory Visit: Payer: 59 | Admitting: Gastroenterology

## 2017-12-14 ENCOUNTER — Encounter

## 2018-01-05 ENCOUNTER — Other Ambulatory Visit: Payer: 59

## 2018-01-17 ENCOUNTER — Other Ambulatory Visit: Payer: 59

## 2018-01-17 ENCOUNTER — Ambulatory Visit
Admission: RE | Admit: 2018-01-17 | Discharge: 2018-01-17 | Disposition: A | Discharge status: Home / Self Care | Payer: 59 | Source: Ambulatory Visit | Attending: Gastroenterology | Admitting: Gastroenterology

## 2018-01-17 DIAGNOSIS — R7989 Other specified abnormal findings of blood chemistry: Secondary | ICD-10-CM | POA: Diagnosis not present

## 2018-01-19 ENCOUNTER — Encounter: Payer: Self-pay | Admitting: Medical

## 2018-01-22 ENCOUNTER — Other Ambulatory Visit: Payer: Self-pay

## 2018-01-22 ENCOUNTER — Emergency Department (HOSPITAL_COMMUNITY): Payer: 59

## 2018-01-22 ENCOUNTER — Emergency Department (HOSPITAL_COMMUNITY)
Admission: EM | Admit: 2018-01-22 | Discharge: 2018-01-22 | Disposition: A | Discharge status: Home / Self Care | Payer: 59 | Attending: Emergency Medicine | Admitting: Emergency Medicine

## 2018-01-22 ENCOUNTER — Encounter (HOSPITAL_COMMUNITY): Payer: Self-pay | Admitting: Emergency Medicine

## 2018-01-22 DIAGNOSIS — Y999 Unspecified external cause status: Secondary | ICD-10-CM | POA: Insufficient documentation

## 2018-01-22 DIAGNOSIS — Y929 Unspecified place or not applicable: Secondary | ICD-10-CM | POA: Diagnosis not present

## 2018-01-22 DIAGNOSIS — I10 Essential (primary) hypertension: Secondary | ICD-10-CM | POA: Diagnosis not present

## 2018-01-22 DIAGNOSIS — F17210 Nicotine dependence, cigarettes, uncomplicated: Secondary | ICD-10-CM | POA: Insufficient documentation

## 2018-01-22 DIAGNOSIS — Z23 Encounter for immunization: Secondary | ICD-10-CM | POA: Diagnosis not present

## 2018-01-22 DIAGNOSIS — Z79899 Other long term (current) drug therapy: Secondary | ICD-10-CM | POA: Insufficient documentation

## 2018-01-22 DIAGNOSIS — S61412A Laceration without foreign body of left hand, initial encounter: Secondary | ICD-10-CM | POA: Insufficient documentation

## 2018-01-22 DIAGNOSIS — W260XXA Contact with knife, initial encounter: Secondary | ICD-10-CM | POA: Diagnosis not present

## 2018-01-22 DIAGNOSIS — Y9389 Activity, other specified: Secondary | ICD-10-CM | POA: Insufficient documentation

## 2018-01-22 DIAGNOSIS — S61432A Puncture wound without foreign body of left hand, initial encounter: Secondary | ICD-10-CM | POA: Diagnosis not present

## 2018-01-22 MED ORDER — TETANUS-DIPHTH-ACELL PERTUSSIS 5-2.5-18.5 LF-MCG/0.5 IM SUSP
0.5000 mL | Freq: Once | INTRAMUSCULAR | Status: AC
  Administered 2018-01-22: 0.5 mL via INTRAMUSCULAR
  Filled 2018-01-22: qty 0.5

## 2018-01-22 MED ORDER — LIDOCAINE HCL (PF) 1 % IJ SOLN
30.0000 mL | Freq: Once | INTRAMUSCULAR | Status: AC
  Administered 2018-01-22: 30 mL
  Filled 2018-01-22: qty 30

## 2018-01-22 NOTE — Discharge Instructions (Addendum)
WOUND CARE ?Please have your stitches/staples removed in 10 or sooner if you have concerns. You may do this at any available urgent care or at your primary care doctor's office. ? Keep area clean and dry for 24 hours. Do not remove ?bandage, if applied. ? After 24 hours, remove bandage and wash wound ?gently with mild soap and warm water. Reapply ?a new bandage after cleaning wound, if directed. ? Continue daily cleansing with soap and water until ?stitches/staples are removed. ? Do not apply any ointments or creams to the wound ?while stitches/staples are in place, as this may cause ?delayed healing. ? Seek medical careif you experience any of the following ?signs of infection: Swelling, redness, pus drainage, ?streaking, fever >101.0 F ? Seek care if you experience excessive bleeding ?that does not stop after 15-20 minutes of constant, firm ?pressure. ? ? ?
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
## 2018-01-26 ENCOUNTER — Ambulatory Visit: Payer: 59 | Admitting: Medical

## 2018-01-26 ENCOUNTER — Encounter: Payer: Self-pay | Admitting: Medical

## 2018-01-26 VITALS — BP 110/74 | HR 85 | Temp 98.5°F | Ht 64.0 in | Wt 163.0 lb

## 2018-01-26 DIAGNOSIS — M79642 Pain in left hand: Secondary | ICD-10-CM | POA: Diagnosis not present

## 2018-01-26 DIAGNOSIS — S41112A Laceration without foreign body of left upper arm, initial encounter: Secondary | ICD-10-CM | POA: Diagnosis not present

## 2018-01-26 DIAGNOSIS — M7989 Other specified soft tissue disorders: Secondary | ICD-10-CM | POA: Diagnosis not present
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
## 2018-01-31 ENCOUNTER — Ambulatory Visit (INDEPENDENT_AMBULATORY_CARE_PROVIDER_SITE_OTHER): Payer: 59 | Admitting: Medical

## 2018-01-31 ENCOUNTER — Encounter: Payer: Self-pay | Admitting: Medical

## 2018-01-31 VITALS — BP 110/80 | HR 69 | Temp 98.2°F | Ht 64.0 in | Wt 163.0 lb

## 2018-01-31 DIAGNOSIS — M25642 Stiffness of left hand, not elsewhere classified: Secondary | ICD-10-CM

## 2018-01-31 DIAGNOSIS — R2232 Localized swelling, mass and lump, left upper limb: Secondary | ICD-10-CM | POA: Insufficient documentation

## 2018-01-31 DIAGNOSIS — S61412D Laceration without foreign body of left hand, subsequent encounter: Secondary | ICD-10-CM | POA: Diagnosis not present

## 2018-01-31 DIAGNOSIS — S61412A Laceration without foreign body of left hand, initial encounter: Secondary | ICD-10-CM

## 2018-01-31 HISTORY — DX: Laceration without foreign body of left hand, initial encounter: S61.412A

## 2018-01-31 HISTORY — DX: Localized swelling, mass and lump, left upper limb: R22.32

## 2018-01-31 HISTORY — DX: Stiffness of left hand, not elsewhere classified: M25.642
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
## 2018-02-26 ENCOUNTER — Other Ambulatory Visit: Payer: Self-pay | Admitting: Medical

## 2018-02-26 MED FILL — TOPIRAMATE 50 MG TABLET: 50 | 30 days supply | Qty: 60 | Fill #0

## 2018-02-26 NOTE — Telephone Encounter (Signed)
Is this okay to refill? 

## 2018-03-14 MED FILL — ATORVASTATIN CALCIUM 20 MG: 20 | 90 days supply | Qty: 90 | Fill #0

## 2018-03-30 MED FILL — TOPIRAMATE 50 MG TABLET: 50 | 30 days supply | Qty: 60 | Fill #1

## 2018-04-03 DIAGNOSIS — H524 Presbyopia: Secondary | ICD-10-CM | POA: Diagnosis not present

## 2018-04-10 ENCOUNTER — Ambulatory Visit
Admission: RE | Admit: 2018-04-10 | Discharge: 2018-04-10 | Disposition: A | Discharge status: Home / Self Care | Payer: 59 | Source: Ambulatory Visit | Attending: Medical | Admitting: Medical

## 2018-04-10 ENCOUNTER — Ambulatory Visit (INDEPENDENT_AMBULATORY_CARE_PROVIDER_SITE_OTHER): Payer: 59 | Admitting: Medical

## 2018-04-10 VITALS — BP 124/80 | HR 56 | Temp 98.3°F | Resp 16

## 2018-04-10 DIAGNOSIS — M549 Dorsalgia, unspecified: Secondary | ICD-10-CM

## 2018-04-10 DIAGNOSIS — R350 Frequency of micturition: Secondary | ICD-10-CM

## 2018-04-10 DIAGNOSIS — R110 Nausea: Secondary | ICD-10-CM

## 2018-04-10 DIAGNOSIS — R102 Pelvic and perineal pain: Secondary | ICD-10-CM | POA: Diagnosis not present

## 2018-04-10 LAB — POCT URINALYSIS DIP (PROADVANTAGE DEVICE)
Bilirubin, UA: NEGATIVE
Glucose, UA: NEGATIVE mg/dL
Ketones, POC UA: NEGATIVE mg/dL
Leukocytes, UA: NEGATIVE
Nitrite, UA: NEGATIVE
Protein Ur, POC: NEGATIVE mg/dL
Specific Gravity, Urine: 1.03
Urobilinogen, Ur: NEGATIVE
pH, UA: 6 (ref 5.0–8.0)

## 2018-04-10 MED ORDER — NITROFURANTOIN MONOHYD MACRO 100 MG PO CAPS: 100.0000 mg | ORAL_CAPSULE | Freq: Two times a day (BID) | ORAL | 0 refills | Status: DC

## 2018-04-10 MED ORDER — ONDANSETRON HCL 4 MG PO TABS: 4.0000 mg | ORAL_TABLET | Freq: Three times a day (TID) | ORAL | 0 refills | Status: DC | PRN

## 2018-04-10 MED ORDER — OXYCODONE-ACETAMINOPHEN 5-325 MG PO TABS: 1.0000 | ORAL_TABLET | ORAL | 0 refills | Status: DC | PRN

## 2018-04-10 MED FILL — OXYCODONE-ACETAMINOPHEN 5-3: 5-325 | 2 days supply | Qty: 10 | Fill #0

## 2018-04-10 MED FILL — NITROFURANTOIN MONO-MCR 100: 100 | 7 days supply | Qty: 14 | Fill #0

## 2018-04-10 MED FILL — ONDANSETRON HCL 4 MG TABLET: 4 | 7 days supply | Qty: 20 | Fill #0

## 2018-04-10 NOTE — Progress Notes (Signed)
Subjective: Chief Complaint  Patient presents with  . kidney stone    kidney stone    Here for possible kidney stone.  She has had a kidney stone prior a few years ago, that required lithotripsy and ultimate surgery to retrieve stone.  Had a stent placed for this.   Last night she had abrupt onset of right flank pain low back and around the right lower abdomen, debilitating pain that brought tears, had to be helped out of her because the pain was so bad.  She took an Ultram she had leftover but that did not help at all.  The pain did subside somewhat overnight but still has pain today but not near as bad as last night.  She has had urinary tract infection before but feels like this is different than any urinary tract infection she has felt in the past.  She denies fever, vomiting, diarrhea, constipation, no blood in stool, no burning with urination no urinary frequency or urgency.  She has felt some nausea, and last night could not get comfortable.  She has a history of elevated alkaline phosphatase earlier in the year, has seen gastroenterology, Dr. Collene Mares, in the last few months with a full panel of labs that was done.  She notes that Dr. Collene Mares did not feel that the alkaline phosphatase was anything serious at the moment.  She has follow-up soon  Past Medical History:  Diagnosis Date  . Anticardiolipin antibody positive   . HSV-2 (herpes simplex virus 2) infection   . Hyperlipidemia   . Hypertension    diet controlled  . Kidney stone   . Migraine   . Renal calculus, right   . Sleep apnea   . TMJ (dislocation of temporomandibular joint)    Current Outpatient Medications on File Prior to Visit  Medication Sig Dispense Refill  . acyclovir ointment (ZOVIRAX) 5 % APPLY TOPICALLY EVERY 3 (THREE) HOURS 30 g 3  . atorvastatin (LIPITOR) 20 MG tablet Take 1 tablet (20 mg total) by mouth at bedtime. 90 tablet 1  . Diclofenac Potassium (CAMBIA) 50 MG PACK Take 50 mg by mouth as needed (for  migraine; max 1 packet per day). 9 each 3  . ibuprofen (ADVIL,MOTRIN) 200 MG tablet Take 200 mg by mouth as needed for pain. Reported on 11/23/2015    . levonorgestrel (MIRENA) 20 MCG/24HR IUD 1 each by Intrauterine route once.    . Lidocaine 5 % CREA APPLY SMALL AMOUNT AS NEEDED TO RECTAL AREA  3  . metoprolol tartrate (LOPRESSOR) 50 MG tablet TAKE 1 TABLET BY MOUTH 2 TIMES DAILY. 60 tablet 2  . Multiple Vitamin (MULTIVITAMIN WITH MINERALS) TABS Take 1 tablet by mouth daily.    Marland Kitchen nystatin-triamcinolone ointment (MYCOLOG) Apply topically 2 (two) times daily. to skin  5  . phentermine 37.5 MG capsule Take 1 capsule (37.5 mg total) by mouth every morning. 30 capsule 2  . RECTICARE 5 % CREA APPLY SMALL AMOUNT AS NEEDED TO RECTAL AREA 30 g 3  . topiramate (TOPAMAX) 50 MG tablet TAKE 1 TABLET BY MOUTH TWICE A DAY 60 tablet 1   No current facility-administered medications on file prior to visit.    ROS as in subjective    Objective: BP 124/80   Pulse (!) 56   Temp 98.3 F (36.8 C) (Oral)   Resp 16   SpO2 98%   General appearance: alert, no distress, WD/WN,  Heart: RRR, normal S1, S2, no murmurs Lungs: CTA bilaterally, no wheezes,  rhonchi, or rales Abdomen: +bs, soft, mild to moderate central abdominal tenderness and slight right central abdomen tenderness, otherwise non tender, non distended, no masses, no hepatomegaly, no splenomegaly Back nontender, no CVA tenderness Pulses: 2+ symmetric, upper and lower extremities, normal cap refill Extremities: No edema   Assessment: Encounter Diagnoses  Name Primary?  . Back pain, unspecified back location, unspecified back pain laterality, unspecified chronicity Yes  . Urinary frequency   . Nausea      Plan: Discussed differential that includes renal stone, UTI, pyelonephritis, or other.  We discussed symptoms each of these etiologies, and we will watch and wait for changes or worsening symptoms.  If symptoms worsen, may need CT scan, may  need referral to urology.  At this point she can use medication as below, urine culture is sent, she will go for KUB x-ray.  The presumption is that she has a kidney stone at this time versus pyelonephritis  Nedra was seen today for kidney stone.  Diagnoses and all orders for this visit:  Back pain, unspecified back location, unspecified back pain laterality, unspecified chronicity -     POCT Urinalysis DIP (Proadvantage Device) -     Urine Culture -     DG Abd 1 View; Future  Urinary frequency -     Urine Culture -     DG Abd 1 View; Future  Nausea -     Urine Culture -     DG Abd 1 View; Future  Other orders -     nitrofurantoin, macrocrystal-monohydrate, (MACROBID) 100 MG capsule; Take 1 capsule (100 mg total) by mouth 2 (two) times daily. -     ondansetron (ZOFRAN) 4 MG tablet; Take 1 tablet (4 mg total) by mouth every 8 (eight) hours as needed for nausea or vomiting. -     oxyCODONE-acetaminophen (PERCOCET) 5-325 MG tablet; Take 1 tablet by mouth every 4 (four) hours as needed for severe pain.   Follow-up pending studies

## 2018-04-11 LAB — URINE CULTURE

## 2018-04-17 ENCOUNTER — Other Ambulatory Visit: Payer: Self-pay | Admitting: Medical

## 2018-04-17 DIAGNOSIS — Z87442 Personal history of urinary calculi: Secondary | ICD-10-CM | POA: Insufficient documentation

## 2018-04-17 DIAGNOSIS — M549 Dorsalgia, unspecified: Secondary | ICD-10-CM | POA: Insufficient documentation

## 2018-04-17 DIAGNOSIS — R109 Unspecified abdominal pain: Secondary | ICD-10-CM | POA: Insufficient documentation

## 2018-04-17 DIAGNOSIS — R350 Frequency of micturition: Secondary | ICD-10-CM | POA: Insufficient documentation

## 2018-04-17 DIAGNOSIS — R1084 Generalized abdominal pain: Secondary | ICD-10-CM

## 2018-04-17 HISTORY — DX: Unspecified abdominal pain: R10.9

## 2018-04-17 HISTORY — DX: Generalized abdominal pain: R10.84

## 2018-04-17 HISTORY — DX: Dorsalgia, unspecified: M54.9

## 2018-05-10 ENCOUNTER — Ambulatory Visit: Payer: 59 | Admitting: Medical

## 2018-05-10 ENCOUNTER — Encounter: Payer: Self-pay | Admitting: Medical

## 2018-05-10 VITALS — BP 140/80 | HR 73 | Temp 98.0°F | Resp 16 | Ht 63.5 in | Wt 165.6 lb

## 2018-05-10 DIAGNOSIS — M222X1 Patellofemoral disorders, right knee: Secondary | ICD-10-CM | POA: Diagnosis not present

## 2018-05-10 DIAGNOSIS — M25561 Pain in right knee: Secondary | ICD-10-CM

## 2018-05-10 DIAGNOSIS — M25562 Pain in left knee: Secondary | ICD-10-CM

## 2018-05-10 DIAGNOSIS — M79672 Pain in left foot: Secondary | ICD-10-CM

## 2018-05-10 DIAGNOSIS — M722 Plantar fascial fibromatosis: Secondary | ICD-10-CM | POA: Insufficient documentation

## 2018-05-10 DIAGNOSIS — M222X2 Patellofemoral disorders, left knee: Secondary | ICD-10-CM

## 2018-05-10 DIAGNOSIS — M79671 Pain in right foot: Secondary | ICD-10-CM | POA: Diagnosis not present

## 2018-05-10 HISTORY — DX: Pain in left knee: M25.562

## 2018-05-10 NOTE — Progress Notes (Signed)
Subjective: Chief Complaint  Patient presents with  . foot pain    foot pain Left,X months  left > right knee pain X 2 weeks   Here for ortho issues.  She notes several month history of left foot pain.  Hurts when she gets on the foot every morning.  Hurts in heel.  Uses ibuprofen, rubs the foot, tries to stretch the foot.  Limping on the foot lately.    Both knees hurt, but lately left knee worse.    Feels like knees need to pop.  Gets popping noises in knees in general.   Lately more pain inside of left knee.   No swelling.  No paresthesias.  No other aggravating or relieving factors. No other complaint.  Past Medical History:  Diagnosis Date  . Anticardiolipin antibody positive   . HSV-2 (herpes simplex virus 2) infection   . Hyperlipidemia   . Hypertension    diet controlled  . Kidney stone   . Migraine   . Renal calculus, right   . Sleep apnea   . TMJ (dislocation of temporomandibular joint)    Current Outpatient Medications on File Prior to Visit  Medication Sig Dispense Refill  . atorvastatin (LIPITOR) 20 MG tablet Take 1 tablet (20 mg total) by mouth at bedtime. 90 tablet 1  . ibuprofen (ADVIL,MOTRIN) 200 MG tablet Take 200 mg by mouth as needed for pain. Reported on 11/23/2015    . levonorgestrel (MIRENA) 20 MCG/24HR IUD 1 each by Intrauterine route once.    . Lidocaine 5 % CREA APPLY SMALL AMOUNT AS NEEDED TO RECTAL AREA  3  . metoprolol tartrate (LOPRESSOR) 50 MG tablet TAKE 1 TABLET BY MOUTH 2 TIMES DAILY. 60 tablet 2  . Multiple Vitamin (MULTIVITAMIN WITH MINERALS) TABS Take 1 tablet by mouth daily.    Marland Kitchen topiramate (TOPAMAX) 50 MG tablet TAKE 1 TABLET BY MOUTH TWICE A DAY 60 tablet 1  . acyclovir ointment (ZOVIRAX) 5 % APPLY TOPICALLY EVERY 3 (THREE) HOURS (Patient not taking: Reported on 05/10/2018) 30 g 3  . Diclofenac Potassium (CAMBIA) 50 MG PACK Take 50 mg by mouth as needed (for migraine; max 1 packet per day). (Patient not taking: Reported on 05/10/2018) 9 each 3   . nitrofurantoin, macrocrystal-monohydrate, (MACROBID) 100 MG capsule Take 1 capsule (100 mg total) by mouth 2 (two) times daily. (Patient not taking: Reported on 05/10/2018) 14 capsule 0  . nystatin-triamcinolone ointment (MYCOLOG) Apply topically 2 (two) times daily. to skin  5  . ondansetron (ZOFRAN) 4 MG tablet Take 1 tablet (4 mg total) by mouth every 8 (eight) hours as needed for nausea or vomiting. (Patient not taking: Reported on 05/10/2018) 20 tablet 0  . oxyCODONE-acetaminophen (PERCOCET) 5-325 MG tablet Take 1 tablet by mouth every 4 (four) hours as needed for severe pain. (Patient not taking: Reported on 05/10/2018) 10 tablet 0  . phentermine 37.5 MG capsule Take 1 capsule (37.5 mg total) by mouth every morning. (Patient not taking: Reported on 05/10/2018) 30 capsule 2  . RECTICARE 5 % CREA APPLY SMALL AMOUNT AS NEEDED TO RECTAL AREA (Patient not taking: Reported on 05/10/2018) 30 g 3   No current facility-administered medications on file prior to visit.    ROS as in subjective   Objective: BP 140/80   Pulse 73   Temp 98 F (36.7 C) (Oral)   Resp 16   Ht 5' 3.5" (1.613 m)   Wt 165 lb 9.6 oz (75.1 kg)   SpO2 98%  BMI 28.87 kg/m    Gen: wd, wn, nad Skin unremarkable Left foot somewhat poor arch support, tender over anterior and lateral heels, but otherwise nontender, normal ROM right foot similar arch , but nontender Otherwise foot and ankle unremarkable Left knee nontender, slight lateral laxity, but no other deformity, no swelling, otherwise nontender and normal ROM Rest of leg exam unremarkable    Assessment: Encounter Diagnoses  Name Primary?  Marland Kitchen Foot pain, bilateral Yes  . Heel pain, bilateral   . Plantar fasciitis   . Pain in both knees, unspecified chronicity   . Patellofemoral pain syndrome of both knees      Plan: Cannot rule out heel spur but more likely plantar fasciitis and patellofemoral syndrome are likely etiologies  We discussed using nighttime  plantar fascia splints, advised towel stretching tennis ball massage, advised ice water bath for pain and inflammation, essential goals topically at night as needed, avoid going barefoot or wearing flats.  Follow-up in 3 to 4 weeks  Regarding patellofemoral syndrome and knee pain, advised daily stretching and gave some home exercises to use  If not improving over the next 3 to 4 weeks consider x-rays of the knees and possible referral to either therapy or podiatry   Kelly [name] was seen today for foot pain.  Diagnoses and all orders for this visit:  Foot pain, bilateral  Heel pain, bilateral  Plantar fasciitis  Pain in both knees, unspecified chronicity  Patellofemoral pain syndrome of both knees

## 2018-05-29 ENCOUNTER — Other Ambulatory Visit: Payer: Self-pay | Admitting: Medical

## 2018-05-29 MED FILL — ACYCLOVIR 5% OINTMENT: 5 | 15 days supply | Qty: 30 | Fill #1

## 2018-05-29 MED FILL — METOPROLOL TARTRATE 50 MG T: 50 | 30 days supply | Qty: 60 | Fill #0

## 2018-05-29 NOTE — Telephone Encounter (Signed)
Is this ok to refill?  

## 2018-06-01 ENCOUNTER — Telehealth: Payer: Self-pay | Admitting: Medical

## 2018-06-01 ENCOUNTER — Other Ambulatory Visit: Payer: 59

## 2018-06-01 ENCOUNTER — Other Ambulatory Visit: Payer: Self-pay

## 2018-06-01 DIAGNOSIS — M79672 Pain in left foot: Principal | ICD-10-CM

## 2018-06-01 DIAGNOSIS — M2540 Effusion, unspecified joint: Secondary | ICD-10-CM

## 2018-06-01 DIAGNOSIS — M2550 Pain in unspecified joint: Secondary | ICD-10-CM

## 2018-06-01 DIAGNOSIS — M79671 Pain in right foot: Secondary | ICD-10-CM

## 2018-06-01 DIAGNOSIS — M722 Plantar fascial fibromatosis: Secondary | ICD-10-CM

## 2018-06-01 DIAGNOSIS — R748 Abnormal levels of other serum enzymes: Secondary | ICD-10-CM | POA: Diagnosis not present

## 2018-06-01 DIAGNOSIS — E559 Vitamin D deficiency, unspecified: Secondary | ICD-10-CM | POA: Diagnosis not present

## 2018-06-01 MED FILL — TOPIRAMATE 50 MG TABLET: 50 | 30 days supply | Qty: 60 | Fill #0

## 2018-06-01 NOTE — Telephone Encounter (Signed)
Lab orders given ongoing joint pain and recheck on elevated ALP and low Vit D

## 2018-06-01 NOTE — Telephone Encounter (Signed)
Refer to ortho ASAP for foot pain, hand pain.    Try [facility] or [facility]

## 2018-06-01 NOTE — Telephone Encounter (Signed)
Referral sent to [facility]

## 2018-06-03 LAB — CBC
Hematocrit: 39.7 % (ref 34.0–46.6)
Hemoglobin: 13.7 g/dL (ref 11.1–15.9)
MCH: 31.6 pg (ref 26.6–33.0)
MCHC: 34.5 g/dL (ref 31.5–35.7)
MCV: 92 fL (ref 79–97)
Platelets: 296 10*3/uL (ref 150–450)
RBC: 4.34 x10E6/uL (ref 3.77–5.28)
RDW: 12.3 % (ref 12.3–15.4)
WBC: 10.4 10*3/uL (ref 3.4–10.8)

## 2018-06-03 LAB — COMPREHENSIVE METABOLIC PANEL
ALT: 17 IU/L (ref 0–32)
AST: 15 IU/L (ref 0–40)
Albumin/Globulin Ratio: 2 (ref 1.2–2.2)
Albumin: 4.5 g/dL (ref 3.5–5.5)
Alkaline Phosphatase: 121 IU/L — ABNORMAL HIGH (ref 39–117)
BUN/Creatinine Ratio: 10 (ref 9–23)
BUN: 8 mg/dL (ref 6–24)
Bilirubin Total: 0.3 mg/dL (ref 0.0–1.2)
CO2: 21 mmol/L (ref 20–29)
Calcium: 9.8 mg/dL (ref 8.7–10.2)
Chloride: 103 mmol/L (ref 96–106)
Creatinine, Ser: 0.82 mg/dL (ref 0.57–1.00)
GFR calc Af Amer: 96 mL/min/{1.73_m2} (ref >59)
GFR calc non Af Amer: 83 mL/min/{1.73_m2} (ref >59)
Globulin, Total: 2.3 g/dL (ref 1.5–4.5)
Glucose: 78 mg/dL (ref 65–99)
Potassium: 4.1 mmol/L (ref 3.5–5.2)
Sodium: 139 mmol/L (ref 134–144)
Total Protein: 6.8 g/dL (ref 6.0–8.5)

## 2018-06-03 LAB — ALKALINE PHOSPHATASE, ISOENZYMES
BONE FRACTION: 26 % (ref 14–68)
INTESTINAL FRAC.: 0 % (ref 0–18)
LIVER FRACTION: 74 % (ref 18–85)

## 2018-06-03 LAB — SEDIMENTATION RATE: Sed Rate: 2 mm/hr (ref 0–40)

## 2018-06-03 LAB — VITAMIN D 25 HYDROXY (VIT D DEFICIENCY, FRACTURES): Vit D, 25-Hydroxy: 22.7 ng/mL — ABNORMAL LOW (ref 30.0–100.0)

## 2018-06-04 ENCOUNTER — Encounter (INDEPENDENT_AMBULATORY_CARE_PROVIDER_SITE_OTHER): Payer: Self-pay | Admitting: Physician Assistant

## 2018-06-04 ENCOUNTER — Ambulatory Visit (INDEPENDENT_AMBULATORY_CARE_PROVIDER_SITE_OTHER): Payer: 59 | Admitting: Physician Assistant

## 2018-06-04 ENCOUNTER — Ambulatory Visit (INDEPENDENT_AMBULATORY_CARE_PROVIDER_SITE_OTHER): Payer: 59

## 2018-06-04 DIAGNOSIS — M722 Plantar fascial fibromatosis: Secondary | ICD-10-CM

## 2018-06-04 DIAGNOSIS — M25562 Pain in left knee: Secondary | ICD-10-CM

## 2018-06-04 MED ORDER — MELOXICAM 7.5 MG PO TABS: 7.5000 mg | ORAL_TABLET | Freq: Two times a day (BID) | ORAL | 1 refills | Status: DC

## 2018-06-04 MED ORDER — METHYLPREDNISOLONE 4 MG PO TABS: ORAL_TABLET | ORAL | 0 refills | Status: DC

## 2018-06-04 MED FILL — MELOXICAM 7.5 MG TABLET: 7.5 | 30 days supply | Qty: 60 | Fill #0

## 2018-06-04 MED FILL — METHYLPREDNISOLONE 4 MG TAB: 4 | 6 days supply | Qty: 21 | Fill #0

## 2018-06-04 NOTE — Progress Notes (Signed)
Office Visit Note   Patient: Kelly [name]           Date of Birth: [birth year]           MRN: [id number] Visit Date: 06/04/2018              Requested by: Carlena Hurl, PA-C [id number] [place], [place] [address] PCP: Carlena Hurl, PA-C   Assessment & Plan: Visit Diagnoses:  1. Left knee pain, unspecified chronicity   2. Plantar fasciitis     Plan: Discussed shoewear with her she is to avoid open back shoes.  She is also to change her shoes out daily.  Gave her gastrocsoleus stretching exercises and quad strengthening exercises.  We will send her to formal physical therapy for quad strengthening gastrocsoleus stretching home exercise program and modalities.  Placed on a Medrol Dosepak no NSAIDs while on the the Dosepak.  Then she is to begin Mobic 7.5 mg 1 p.o. twice daily.  See her back in a month check her progress lack of.  She is also given heel cups place in her shoes.  Follow-Up Instructions: Return in about 4 weeks (around 07/02/2018).   Orders:  Orders Placed This Encounter  Procedures  . XR Knee 1-2 Views Left  . XR Foot Complete Left   Meds ordered this encounter  Medications  . methylPREDNISolone (MEDROL) 4 MG tablet    Sig: Take as directed    Dispense:  21 tablet    Refill:  0  . meloxicam (MOBIC) 7.5 MG tablet    Sig: Take 1 tablet (7.5 mg total) by mouth 2 (two) times daily.    Dispense:  60 tablet    Refill:  1      Procedures: No procedures performed   Clinical Data: No additional findings.   Subjective: Chief Complaint  Patient presents with  . Left Knee - Pain  . Left Foot - Pain    HPI Kelly [name] 52 year old female comes in today with left knee pain and left foot pain.  States she has had pain in her aspect of her left foot for the past 2 months despite trying ibuprofen night splints when changing her shoewear.  Left knee pain is worse when going downstairs.  She feels that her knee pain may due to the fact she is walking to  try to offload her left heel.  She is had no injury to either the heel or the left knee.  No mechanical symptoms of the left knee. Review of Systems See HPI  Objective: Vital Signs: There were no vitals taken for this visit.  Physical Exam General: Well-developed well-nourished female no acute distress Psych: Alert and oriented x3 Respiratory: Unlabored breathing. Ortho Exam Bilateral knees full range of motion.  Passive range of motion of the left knee reveals patellofemoral crepitus.  No instability valgus varus stressing of either knee.  No tenderness along medial lateral joint line of either knee.  Peripatellar tenderness of the left knee.  Stable do straight leg raise bilaterally.  Calf supple nontender.  McMurray's is negative bilaterally. Left foot no rashes skin lesions ulcerations and impending ulcers.  Tenderness medial tubercle of calcaneus.  Nontender at the lesser metatarsal heads.  Tight gastrocs left greater than right. Specialty Comments:  No specialty comments available.  Imaging: Xr Foot Complete Left  Result Date: 06/04/2018 Left foot AP, lateral, and oblique views: No acute fracture.  No significant arthritic changes.  Cavus type foot with a Morton's  type foot with the second third metatarsals being longer than the first metatarsal.  Xr Knee 1-2 Views Left  Result Date: 06/04/2018 Left knee AP and lateral views: No acute fractures.  All 3 compartments well-preserved.  Knee is well located.  No bony abnormalities.    PMFS History: Patient Active Problem List   Diagnosis Date Noted  . Foot pain, bilateral 05/10/2018  . Heel pain, bilateral 05/10/2018  . Plantar fasciitis 05/10/2018  . Left knee pain 05/10/2018  . Patellofemoral pain syndrome of both knees 05/10/2018  . Generalized abdominal pain 04/17/2018  . Flank pain 04/17/2018  . Acute back pain 04/17/2018  . History of renal stone 04/17/2018  . Urinary frequency 04/17/2018  . Laceration of left hand  01/31/2018  . Decreased range of motion of finger of left hand 01/31/2018  . Localized swelling on left hand 01/31/2018  . Chronic migraine without aura without status migrainosus, not intractable 11/01/2016  . History of migraine 06/10/2016  . Diarrhea 05/20/2016  . Pain in the chest 05/11/2016  . Dizziness and giddiness 05/11/2016  . History of hypertension 05/11/2016  . Hemorrhoidal skin tag 04/07/2015  . IUD (intrauterine device) in place 05/03/2013  . Allergic rhinitis 01/11/2013  . Current smoker 01/11/2013  . Weight gain 06/26/2012  . Cervical stenosis (uterine cervix) 04/25/2012  . Anticardiolipin antibody positive   . Hyperlipidemia   . Hypertension   . History of trichomoniasis 01/18/2011    Class: History of  . THYROID NODULE, RIGHT 02/05/2008  . GERD 02/05/2008  . NECK PAIN 02/05/2008   Past Medical History:  Diagnosis Date  . Anticardiolipin antibody positive   . HSV-2 (herpes simplex virus 2) infection   . Hyperlipidemia   . Hypertension    diet controlled  . Kidney stone   . Migraine   . Renal calculus, right   . Sleep apnea   . TMJ (dislocation of temporomandibular joint)     Family History  Problem Relation Age of Onset  . Diabetes Father   . Hypertension Father   . Cancer Father        colon cancer  . Kidney Stones Father   . Heart disease Mother 65       died suddenly of MI  . Heart attack Mother   . Diabetes Sister   . Heart disease Maternal Uncle 101       died of MI  . Heart disease Maternal Uncle   . Breast cancer Maternal Grandmother     Past Surgical History:  Procedure Laterality Date  . CESAREAN SECTION     X3   LAST ONE 11-30-2004  . CYSTOSCOPY W/ RETROGRADES Right 10/29/2012   Procedure: CYSTOSCOPY WITH RETROGRADE PYELOGRAM and stent placement;  Surgeon: Reece Packer, MD;  Location: [facility];  Service: Urology;  Laterality: Right;  rt stent placement , rt retrograde and cysto   . CYSTOSCOPY W/ URETERAL STENT  PLACEMENT Right 11/23/2012   Procedure: CYSTOSCOPY WITH STENT REPLACEMENT;  Surgeon: Alexis Frock, MD;  Location: [facility];  Service: Urology;  Laterality: Right;  . CYSTOSCOPY/RETROGRADE/URETEROSCOPY/STONE EXTRACTION WITH BASKET Right 11/23/2012   Procedure: CYSTOSCOPY/RETROGRADE/URETEROSCOPY/STONE EXTRACTION WITH BASKET;  Surgeon: Alexis Frock, MD;  Location: [facility];  Service: Urology;  Laterality: Right;   Social History   Occupational History    Comment: [employer], pt advocate  Tobacco Use  . Smoking status: Current Every Day Smoker    Packs/day: 0.50    Years: 11.00  Pack years: 5.50    Types: Cigarettes  . Smokeless tobacco: Never Used  Substance and Sexual Activity  . Alcohol use: No    Alcohol/week: 0.0 standard drinks  . Drug use: No  . Sexual activity: Yes    Partners: Male    Birth control/protection: IUD    Comment: 1ST intercourse- 18, partners- 28 current partner- 1 year

## 2018-06-05 ENCOUNTER — Other Ambulatory Visit: Payer: Self-pay

## 2018-06-05 ENCOUNTER — Other Ambulatory Visit (INDEPENDENT_AMBULATORY_CARE_PROVIDER_SITE_OTHER): Payer: Self-pay

## 2018-06-05 ENCOUNTER — Other Ambulatory Visit: Payer: Self-pay | Admitting: Medical

## 2018-06-05 DIAGNOSIS — M549 Dorsalgia, unspecified: Secondary | ICD-10-CM

## 2018-06-05 DIAGNOSIS — M222X1 Patellofemoral disorders, right knee: Secondary | ICD-10-CM

## 2018-06-05 DIAGNOSIS — M222X2 Patellofemoral disorders, left knee: Principal | ICD-10-CM

## 2018-06-05 DIAGNOSIS — M25562 Pain in left knee: Secondary | ICD-10-CM

## 2018-06-05 DIAGNOSIS — M722 Plantar fascial fibromatosis: Secondary | ICD-10-CM

## 2018-06-05 MED ORDER — VITAMIN D 1000 UNITS PO TABS: 1000.0000 [IU] | ORAL_TABLET | Freq: Every day | ORAL | 0 refills | Status: DC

## 2018-06-08 ENCOUNTER — Telehealth: Payer: Self-pay | Admitting: *Deleted

## 2018-06-08 NOTE — Telephone Encounter (Signed)
Patient called coming for remove/insert Mirena IUD on 07/13/17, she said her friend mentioned to her she was given a pill to help relax her cervix for the insertion. Patient asked if this would be something she could use? States she is a little nervous about the process.  Please advise

## 2018-06-08 NOTE — Telephone Encounter (Signed)
Probably Cytotec, prefer not to use, more pain/cramps from Cytotec than procedure itself.  Reassure patient that I will be gentle and the procedure is very short.

## 2018-06-11 NOTE — Telephone Encounter (Signed)
Patient informed. 

## 2018-06-12 ENCOUNTER — Encounter: Payer: Self-pay | Admitting: Family Medicine

## 2018-06-19 ENCOUNTER — Other Ambulatory Visit: Payer: Self-pay

## 2018-06-19 ENCOUNTER — Encounter: Payer: Self-pay | Admitting: Physical Therapy

## 2018-06-19 ENCOUNTER — Ambulatory Visit: Payer: 59 | Attending: Orthopaedic Surgery | Admitting: Physical Therapy

## 2018-06-19 DIAGNOSIS — M25572 Pain in left ankle and joints of left foot: Secondary | ICD-10-CM | POA: Insufficient documentation

## 2018-06-19 DIAGNOSIS — R600 Localized edema: Secondary | ICD-10-CM | POA: Insufficient documentation

## 2018-06-19 DIAGNOSIS — R2689 Other abnormalities of gait and mobility: Secondary | ICD-10-CM | POA: Diagnosis not present

## 2018-06-19 DIAGNOSIS — G8929 Other chronic pain: Secondary | ICD-10-CM | POA: Insufficient documentation

## 2018-06-19 DIAGNOSIS — M25562 Pain in left knee: Secondary | ICD-10-CM | POA: Diagnosis not present

## 2018-06-19 NOTE — Therapy (Addendum)
[place], [place], [address] Phone: [phone]   Fax:  [phone]  Physical Therapy Evaluation / discharge summary  Patient Details  Name: Kelly [name] MRN: [id number] Date of Birth: 22-Aug-1966 Referring Provider (PT): Zollie Beckers MD   Encounter Date: 06/19/2018  PT End of Session - 06/19/18 1203    Visit Number  1    Number of Visits  13    Date for PT Re-Evaluation  07/31/18    Authorization Type  [facility] [employer]    PT Start Time  0807   pt arrived 7 min late   PT Stop Time  0846    PT Time Calculation (min)  39 min    Activity Tolerance  Patient tolerated treatment well    Behavior During Therapy  [facility] for tasks assessed/performed       Past Medical History:  Diagnosis Date  . Anticardiolipin antibody positive   . HSV-2 (herpes simplex virus 2) infection   . Hyperlipidemia   . Hypertension    diet controlled  . Kidney stone   . Migraine   . Renal calculus, right   . Sleep apnea   . TMJ (dislocation of temporomandibular joint)     Past Surgical History:  Procedure Laterality Date  . CESAREAN SECTION     X3   LAST ONE 11-30-2004  . CYSTOSCOPY W/ RETROGRADES Right 10/29/2012   Procedure: CYSTOSCOPY WITH RETROGRADE PYELOGRAM and stent placement;  Surgeon: Reece Packer, MD;  Location: [place];  Service: Urology;  Laterality: Right;  rt stent placement , rt retrograde and cysto   . CYSTOSCOPY W/ URETERAL STENT PLACEMENT Right 11/23/2012   Procedure: CYSTOSCOPY WITH STENT REPLACEMENT;  Surgeon: Alexis Frock, MD;  Location: [facility];  Service: Urology;  Laterality: Right;  . CYSTOSCOPY/RETROGRADE/URETEROSCOPY/STONE EXTRACTION WITH BASKET Right 11/23/2012   Procedure: CYSTOSCOPY/RETROGRADE/URETEROSCOPY/STONE EXTRACTION WITH BASKET;  Surgeon: Alexis Frock, MD;  Location[facility];  Service: Urology;  Laterality: Right;    There were no  vitals filed for this visit.   Subjective Assessment - 06/19/18 0811    Subjective  pt is a 52 y.o F with CC of  L foot/ arch pain that has been onging for a couple years with no specific onset, reporting significant pain that started about 3 months ago. she has been very proactive about getting a night splint, different shoes, avoiding going bare foot but has no problem. Which has caused her L knee to begin hurting due to altered gait pattern, which started about 3 months ago. reports intermittent swelling in the knee and pain navigating steps. no hx of knee pain prior to this incident and reports foot and ankle pain continued to worsen since onset.     Limitations  Standing;Walking   stairs   How long can you sit comfortably?  unlimited    How long can you stand comfortably?  5-10 min     How long can you walk comfortably?  30 min     Diagnostic tests  xrays at MD's office    Patient Stated Goals  to decrease pain, standing and walk without issues. not use medication to control pain. 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  Yes    Pain Score  2    at worst 8/10   Pain Location  Knee    Pain Orientation  Left;Medial    Pain Descriptors / Indicators  Aching;Tightness    Pain Type  Chronic pain    Pain Onset  More than a month ago    Pain Frequency  Constant    Aggravating Factors   standing/ walking, stairs     Pain Relieving Factors  medication, sitting/ resting    Effect of Pain on Daily Activities  limited walking/ standing    Multiple Pain Sites  Yes    Pain Score  4   at worst 10/10   Pain Location  Foot    Pain Orientation  Left   in the heel   Pain Descriptors / Indicators  Aching;Sharp;Stabbing    Pain Type  Chronic pain    Pain Onset  More than a month ago    Pain Frequency  Constant    Aggravating Factors   walking/ standing, light touch, stairs, getting out of bed    Pain Relieving Factors  sitting, resting          [facility] PT Assessment - 06/19/18 0812       Assessment   Medical Diagnosis  PFPS bil, plantar fasciitis, L knee pain and back pain    Referring Provider (PT)  Zollie Beckers MD    Onset Date/Surgical Date  --   chronic foot pain with exacerbationg 3 months ago   Hand Dominance  Right    Next MD Visit  2 weeks    Prior Therapy  yes       Precautions   Precautions  None    Precaution Comments  avoiding stairs, treadmills,       Restrictions   Weight Bearing Restrictions  No      Balance Screen   Has the patient fallen in the past 6 months  No    Has the patient had a decrease in activity level because of a fear of falling?   No    Is the patient reluctant to leave their home because of a fear of falling?   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, swimming, being outside      [employer]   Overall Cognitive Status  Within Functional Limits for tasks assessed      Observation/Other Assessments   Focus on Therapeutic Outcomes (FOTO)   55% limited    predicted 35% limited     Posture/Postural Control   Posture/Postural Control  Postural limitations    Postural Limitations  Rounded Shoulders;Forward head      ROM / Strength   AROM / PROM / Strength  AROM;Strength;PROM      AROM   Overall AROM Comments  knee ROM WFL    AROM Assessment Site  Knee;Ankle    Right/Left Knee  Right;Left  Right/Left Ankle  Right;Left    Right Ankle  Dorsiflexion  4    Right Ankle Plantar Flexion  50    Right Ankle Inversion  26    Right Ankle Eversion  12    Left Ankle Dorsiflexion  0   assessed at 90 degrees   Left Ankle Plantar Flexion  50    Left Ankle Inversion  32    Left Ankle Eversion  18      PROM   PROM Assessment Site  Ankle    Right/Left Ankle  Right;Left    Left Ankle Dorsiflexion  8   ERP in the calf and foot     Strength   Strength Assessment Site  Knee;Ankle    Right/Left Knee  Right;Left    Right Knee Flexion  5/5    Right Knee Extension  5/5    Left Knee Flexion  5/5    Left Knee Extension  5/5    Right/Left Ankle  Right;Left    Right Ankle Dorsiflexion  5/5    Right Ankle Plantar Flexion  5/5    Right Ankle Inversion  5/5    Right Ankle Eversion  5/5    Left Ankle Dorsiflexion  4/5    Left Ankle Plantar Flexion  4/5   pain during testing   Left Ankle Inversion  4+/5    Left Ankle Eversion  4+/5      Palpation   Palpation comment  TTP along the medial joint line, and the pes anserine on the L, L foot medial calcaneal tubercle, and gastroc tightness on the L, and pain along the plantar fascia      Ambulation/Gait   Ambulation/Gait  Yes    Gait Pattern  Decreased stance time - left;Decreased step length - right;Step-through pattern;Antalgic;Trendelenburg                Objective measurements completed on examination: See above findings.      [facility] Adult PT Treatment/Exercise - 06/19/18 0812      Knee/Hip Exercises: Supine   Short Arc Quad Sets  2 sets;Strengthening;Left;10 reps   with pillow squeeze     Manual Therapy   Manual therapy comments  MTPR along quadratus plantae, gastroc/ soleus      Ankle Exercises: Stretches   Gastroc Stretch  2 reps;30 seconds             PT Education - 06/19/18 1159    Education Details  evaluation findings, POC, goals, HEP with proper form/ rationale. briefly discussed DN.     Person(s) Educated  Patient    Methods  Explanation;Verbal  cues    Comprehension  Verbalized understanding;Verbal cues required       PT Short Term Goals - 06/19/18 1211      PT SHORT TERM GOAL #1   Title  pt to be I with initial HEP     Time  3    Period  Weeks    Status  New    Target Date  07/10/18      PT SHORT TERM GOAL #2   Title  pt to verbalize techniques to reduce pain in the foot and knee via RICE and HEP     Time  3    Period  Weeks    Status  New    Target Date  07/10/18        PT Long Term Goals - 06/19/18 1212      PT LONG TERM GOAL #1  Title  pt to improve DF to >/= 5 degrees on the L to promote proper gait mechanics reporting </= 2/10 pain     Baseline  initial measure 0 degrees    Time  6    Period  Weeks    Status  New    Target Date  07/31/18      PT LONG TERM GOAL #2   Title  increase strength in L ankle to >/= 4+/5 to provide support with prolonged walking/ standing activities    Time  6    Period  Weeks    Status  New    Target Date  07/31/18      PT LONG TERM GOAL #3   Title  pt to be able to stand/ walk >/= 45 min and navigate up/ down stairs reciprocally with </= 1/10 pain in the heel/ knee for functional endurance required for work and personal goal of returning to participating with children's activities    Time  6    Period  Weeks    Status  New    Target Date  07/31/18      PT LONG TERM GOAL #4   Title  increase FOTO score to </= 35% limited to demo improvement in function     Time  6    Period  Weeks    Status  New    Target Date  07/31/18      PT LONG TERM GOAL #5   Title  pt to be I with all HEP given as of last visit to maintain and progress current level of function     Time  6    Period  Weeks    Status  New    Target Date  07/31/18             Plan - 06/19/18 1204    Clinical Impression Statement  pt is a 52 y.o F presenting to [facility] with CC of L foot/ knee pain reporting hx of chronic pain with recent exacerbation 3 months ago with no specific MOI. functional knee ROM  and limited ankle Df and increased mobility with inversion on the L compared bil. TTP along the medial calcaneal tubercle, and PF on the L. pt requested to focus on the foot due to increased pain. She noted improvement of symptoms with stretching and MTPR. She would benefit from physical therapy to decrease L foot/ knee pain, promote stability of the foot, and maximize function by addressing the deficits listed.     History and Personal Factors relevant to plan of care:  chronic hx or knee/ ankle pain, pt lives with children    Clinical Presentation  Evolving    Clinical Presentation due to:  multiple joints involved with continued worsening pain, abnormal gait, increased L ankle mobility, weakness in the ankle.     Clinical Decision Making  Moderate    Rehab Potential  Good    PT Frequency  2x / week    PT Duration  6 weeks    PT Treatment/Interventions  ADLs/Self Care Home Management;Cryotherapy;Electrical Stimulation;Iontophoresis 61m/ml Dexamethasone;Moist Heat;Ultrasound;Balance training;Therapeutic activities;Therapeutic exercise;Manual techniques;Taping;Dry needling;Passive range of motion;Patient/family education;Joint Manipulations;Stair training;Gait training    PT Next Visit Plan  review/ update HEP, arch support, Discuss DN in the foot. STW along calves and stretching, foot intrinsic strengthening, Knee PFPS VMO activation     PT Home Exercise Plan  towel scrunch, calf stretching (seated and standing), SAQ with balls squeeze,  Consulted and Agree with Plan of Care  Patient       Patient will benefit from skilled therapeutic intervention in order to improve the following deficits and impairments:  Abnormal gait, Pain, Impaired UE functional use, Decreased strength, Decreased balance, Decreased endurance, Postural dysfunction, Improper body mechanics, Decreased range of motion, Difficulty walking, Increased muscle spasms  Visit Diagnosis: Chronic pain of left knee - Plan: PT plan of  care cert/re-cert  Pain in left ankle and joints of left foot - Plan: PT plan of care cert/re-cert  Localized edema - Plan: PT plan of care cert/re-cert  Other abnormalities of gait and mobility - Plan: PT plan of care cert/re-cert     Problem List Patient Active Problem List   Diagnosis Date Noted  . Foot pain, bilateral 05/10/2018  . Heel pain, bilateral 05/10/2018  . Plantar fasciitis 05/10/2018  . Left knee pain 05/10/2018  . Patellofemoral pain syndrome of both knees 05/10/2018  . Generalized abdominal pain 04/17/2018  . Flank pain 04/17/2018  . Acute back pain 04/17/2018  . History of renal stone 04/17/2018  . Urinary frequency 04/17/2018  . Laceration of left hand 01/31/2018  . Decreased range of motion of finger of left hand 01/31/2018  . Localized swelling on left hand 01/31/2018  . Chronic migraine without aura without status migrainosus, not intractable 11/01/2016  . History of migraine 06/10/2016  . Diarrhea 05/20/2016  . Pain in the chest 05/11/2016  . Dizziness and giddiness 05/11/2016  . History of hypertension 05/11/2016  . Hemorrhoidal skin tag 04/07/2015  . IUD (intrauterine device) in place 05/03/2013  . Allergic rhinitis 01/11/2013  . Current smoker 01/11/2013  . Weight gain 06/26/2012  . Cervical stenosis (uterine cervix) 04/25/2012  . Anticardiolipin antibody positive   . Hyperlipidemia   . Hypertension   . History of trichomoniasis 01/18/2011    Class: History of  . THYROID NODULE, RIGHT 02/05/2008  . GERD 02/05/2008  . NECK PAIN 02/05/2008   Starr Lake PT, DPT, LAT, ATC  06/19/18  12:20 PM      [facility] [address] [place], [place], [address] Phone: [phone]   Fax:  [phone]  Name: Kelly [name] MRN: [id number] Date of Birth: 08-27-1965     PHYSICAL THERAPY DISCHARGE SUMMARY  Visits from Start of Care: 1  Current functional level related to goals /  functional outcomes: See goals   Remaining deficits: unknown   Education / Equipment: HEP  Plan: Patient agrees to discharge.  Patient goals were not met. Patient is being discharged due to not returning since the last visit.  ?????         Kristoffer Leamon PT, DPT, LAT, ATC  07/31/18  11:25 AM

## 2018-07-02 MED FILL — ATORVASTATIN CALCIUM 20 MG: 20 | 90 days supply | Qty: 90 | Fill #1

## 2018-07-11 ENCOUNTER — Encounter: Payer: Self-pay | Admitting: Obstetrics & Gynecology

## 2018-07-11 ENCOUNTER — Ambulatory Visit (INDEPENDENT_AMBULATORY_CARE_PROVIDER_SITE_OTHER): Payer: 59 | Admitting: Obstetrics & Gynecology

## 2018-07-11 VITALS — BP 128/86

## 2018-07-11 DIAGNOSIS — Z30433 Encounter for removal and reinsertion of intrauterine contraceptive device: Secondary | ICD-10-CM | POA: Diagnosis not present

## 2018-07-11 NOTE — Progress Notes (Signed)
    [place] 02/04/1966 [id number]        52 y.o.  [id number] Single  RP: Removal and insertion of a new Mirena IUD  HPI: Time to change Mirena IUD after 5 years.  No BTB.  No pelvic pain.   OB History  Gravida Para Term Preterm AB Living  4 3 3   1 3   SAB TAB Ectopic Multiple Live Births          3    # Outcome Date GA Lbr Len/2nd Weight Sex Delivery Anes PTL Lv  4 AB           3 Term     F CS-Unspec  N LIV  2 Term     F CS-Unspec  N LIV  1 Term     M CS-Unspec  N LIV    Past medical history,surgical history, problem list, medications, allergies, family history and social history were all reviewed and documented in the EPIC chart.   Directed ROS with pertinent positives and negatives documented in the history of present illness/assessment and plan.  Exam:  Vitals:   07/11/18 1612  BP: 128/86   General appearance:  Normal                                                                    IUD procedure note       Patient presented to the office today for removal and placement of Mirena IUD. The patient had previously been provided with literature information on this method of contraception. The risks benefits and pros and cons were discussed and all her questions were answered. She is fully aware that this form of contraception is 99% effective and is good for 5 years.  Pelvic exam: Vulva normal Vagina: No lesions or discharge Cervix: No lesions or discharge.  Strings not visible.   Uterus: AV position Adnexa: No masses or tenderness Rectal exam: Not done  The cervix was cleansed with Betadine solution. Hurricane spray on the cervix.  A single-tooth tenaculum was placed on the anterior cervical lip.  The Os Finder is used to dilate the cervix.  The IUD clamp is inserted in the endocervix and the strings are grasped at that level.  The IUD is removed easily.  It is intact and complete, shown to patient and discarded.  Then, the new IUD was shown to the patient and  inserted in a sterile fashion.  Hysterometry with the IUD as being inserted was 7 cm.  The IUD string was trimmed. The single-tooth tenaculum was removed. Patient was instructed to return back to the office in one month for follow up.        Assessment/Plan:  52 y.o. [id number]   1. Encounter for IUD removal and reinsertion Time to remove and reinsert Mirena IUD after 5 years.  IUD strings not visible but able to grasp the strings in the endocervix and remove the IUD easily.  New Mirena IUD inserted without complication.  Well-tolerated by patient.  Precautions reviewed.  Will follow-up in 4 weeks to confirm good position and no infection.  [name] MD, 4:26 PM 07/11/2018

## 2018-07-11 NOTE — Patient Instructions (Signed)
1. Encounter for IUD removal and reinsertion Time to remove and reinsert Mirena IUD after 5 years.  IUD strings not visible but able to grasp the strings in the endocervix and remove the IUD easily.  New Mirena IUD inserted without complication.  Well-tolerated by patient.  Precautions reviewed.  Will follow-up in 4 weeks to confirm good position and no infection.  Kelly [name], it was a pleasure seeing you today!

## 2018-07-12 DIAGNOSIS — G4733 Obstructive sleep apnea (adult) (pediatric): Secondary | ICD-10-CM | POA: Diagnosis not present

## 2018-07-13 ENCOUNTER — Ambulatory Visit: Payer: 59 | Admitting: Obstetrics & Gynecology

## 2018-07-13 ENCOUNTER — Encounter: Payer: Self-pay | Admitting: Anesthesiology

## 2018-08-13 MED FILL — METOPROLOL TARTRATE 50 MG T: 50 | 30 days supply | Qty: 60 | Fill #1

## 2018-08-13 MED FILL — TOPIRAMATE 50 MG TABLET: 50 | 30 days supply | Qty: 60 | Fill #1

## 2018-08-13 MED FILL — ACYCLOVIR 5% OINTMENT: 5 | 15 days supply | Qty: 30 | Fill #2

## 2018-08-13 MED FILL — MELOXICAM 7.5 MG TABLET: 7.5 | 30 days supply | Qty: 60 | Fill #1

## 2018-08-21 ENCOUNTER — Ambulatory Visit (INDEPENDENT_AMBULATORY_CARE_PROVIDER_SITE_OTHER): Payer: 59

## 2018-08-21 ENCOUNTER — Ambulatory Visit (INDEPENDENT_AMBULATORY_CARE_PROVIDER_SITE_OTHER): Payer: 59 | Admitting: Podiatry

## 2018-08-21 ENCOUNTER — Other Ambulatory Visit: Payer: Self-pay | Admitting: Podiatry

## 2018-08-21 ENCOUNTER — Encounter

## 2018-08-21 VITALS — BP 135/83 | HR 81

## 2018-08-21 DIAGNOSIS — M722 Plantar fascial fibromatosis: Secondary | ICD-10-CM

## 2018-08-21 DIAGNOSIS — M79672 Pain in left foot: Secondary | ICD-10-CM

## 2018-08-21 NOTE — Patient Instructions (Signed)

## 2018-08-24 NOTE — Progress Notes (Signed)
Subjective:   Patient ID: Kelly [name], female   DOB: 53 y.o.   MRN: [id number]   HPI 53 year old female presents the office today for concerns of continued to the left heel as well as the arch.  This been ongoing 6 months has been tender to touch.  She states that she has tried several things including change in shoes, night sweats, anti-inflammatories as well as physical therapy without any significant improvement.  She denies any numbness or tingling.  The majority pain is in the morning when she first gets up after periods of rest as well.  Denies any significant swelling.  Pain does not wake her up at night.  She has no other concerns today.   Review of Systems  All other systems reviewed and are negative.  Past Medical History:  Diagnosis Date  . Anticardiolipin antibody positive   . HSV-2 (herpes simplex virus 2) infection   . Hyperlipidemia   . Hypertension    diet controlled  . Kidney stone   . Migraine   . Renal calculus, right   . Sleep apnea   . TMJ (dislocation of temporomandibular joint)     Past Surgical History:  Procedure Laterality Date  . CESAREAN SECTION     X3   LAST ONE 11-30-2004  . CYSTOSCOPY W/ RETROGRADES Right 10/29/2012   Procedure: CYSTOSCOPY WITH RETROGRADE PYELOGRAM and stent placement;  Surgeon: Reece Packer, MD;  Location: [place];  Service: Urology;  Laterality: Right;  rt stent placement , rt retrograde and cysto   . CYSTOSCOPY W/ URETERAL STENT PLACEMENT Right 11/23/2012   Procedure: CYSTOSCOPY WITH STENT REPLACEMENT;  Surgeon: Alexis Frock, MD;  Location: [facility];  Service: Urology;  Laterality: Right;  . CYSTOSCOPY/RETROGRADE/URETEROSCOPY/STONE EXTRACTION WITH BASKET Right 11/23/2012   Procedure: CYSTOSCOPY/RETROGRADE/URETEROSCOPY/STONE EXTRACTION WITH BASKET;  Surgeon: Alexis Frock, MD;  Location: [facility];  Service: Urology;  Laterality: Right;     Current Outpatient Medications:   .  atorvastatin (LIPITOR) 20 MG tablet, Take 1 tablet (20 mg total) by mouth at bedtime., Disp: 90 tablet, Rfl: 1 .  cholecalciferol (VITAMIN D) 1000 units tablet, Take 1 tablet (1,000 Units total) by mouth daily., Disp: 90 tablet, Rfl: 0 .  ibuprofen (ADVIL,MOTRIN) 200 MG tablet, Take 200 mg by mouth as needed for pain. Reported on 11/23/2015, Disp: , Rfl:  .  levonorgestrel (MIRENA) 20 MCG/24HR IUD, 1 each by Intrauterine route once., Disp: , Rfl:  .  meloxicam (MOBIC) 7.5 MG tablet, Take 1 tablet (7.5 mg total) by mouth 2 (two) times daily., Disp: 60 tablet, Rfl: 1 .  methylPREDNISolone (MEDROL DOSEPAK) 4 MG TBPK tablet, See admin instructions., Disp: , Rfl: 0 .  metoprolol tartrate (LOPRESSOR) 50 MG tablet, TAKE 1 TABLET BY MOUTH 2 TIMES DAILY., Disp: 60 tablet, Rfl: 2 .  Multiple Vitamin (MULTIVITAMIN WITH MINERALS) TABS, Take 1 tablet by mouth daily., Disp: , Rfl:  .  topiramate (TOPAMAX) 50 MG tablet, TAKE 1 TABLET BY MOUTH 2 TIMES A DAY, Disp: 60 tablet, Rfl: 1  Allergies  Allergen Reactions  . Imitrex [Sumatriptan Base] Other (See Comments)    Esophageal discomfort most likely serotonin effect  . Zomig Other (See Comments)    Throat discomfort, most likely serotonin effect   . Avelox [Moxifloxacin Hcl In Nacl] Hives  . Morphine Nausea And Vomiting  . Septra [Bactrim] Hives         Objective:  Physical Exam  General: AAO x3, NAD  Dermatological: Skin is warm, dry and supple bilateral. Nails x 10 are well manicured; remaining integument appears unremarkable at this time. There are no open sores, no preulcerative lesions, no rash or signs of infection present.  Vascular: Dorsalis Pedis artery and Posterior Tibial artery pedal pulses are 2/4 bilateral with immedate capillary fill time. Pedal hair growth present. No varicosities and no lower extremity edema present bilateral. There is no pain with calf compression, swelling, warmth, erythema.   Neruologic: Grossly intact via  light touch bilateral. Vibratory intact via tuning fork bilateral. Protective threshold with Semmes Wienstein monofilament intact to all pedal sites bilateral. Patellar and Achilles deep tendon reflexes 2+ bilateral. No Babinski or clonus noted bilateral.   Musculoskeletal: Tenderness to palpation along the plantar medial tubercle of the calcaneus at the insertion of plantar fascia on the left foot. There is no pain along the course of the plantar fascia within the arch of the foot. Plantar fascia appears to be intact. There is no pain with lateral compression of the calcaneus or pain with vibratory sensation. There is no pain along the course or insertion of the achilles tendon. No other areas of tenderness to bilateral lower extremities.Muscular strength 5/5 in all groups tested bilateral.  Gait: Unassisted, Nonantalgic.       Assessment:   Left heel pain, plantar fasciitis    Plan:  -Treatment options discussed including all alternatives, risks, and complications -Etiology of symptoms were discussed -X-rays were obtained and reviewed with the patient.  No evidence of acute fracture or stress fracture identified at this time. -Steroid injection performed.  See procedure note below. -Plantar fascial brace dispensed. -Continue stretching, icing exercises daily. -She was also molded for orthotics today by Liliane Channel.  Discussed changing shoes as well. -Discussed other modalities including EPAT.  If she is not having significant provement with the injection, brace as well as orthotics and she will likely start this regimen but would like to try the other options first.  Procedure: Injection Tendon/Ligament Discussed alternatives, risks, complications and verbal consent was obtained.  Location: Left plantar fascia at the glabrous junction; medial approach. Skin Prep: Alcohol. Injectate: 0.5cc 0.5% marcaine plain, 0.5 cc 2% lidocaine plain and, 1 cc kenalog 10. Disposition: Patient tolerated  procedure well. Injection site dressed with a band-aid.  Post-injection care was discussed and return precautions discussed.    Trula Slade DPM

## 2018-09-11 ENCOUNTER — Ambulatory Visit: Payer: 59 | Admitting: Orthotics

## 2018-09-11 ENCOUNTER — Encounter: Payer: Self-pay | Admitting: Neurology

## 2018-09-11 ENCOUNTER — Ambulatory Visit: Payer: 59 | Admitting: Neurology

## 2018-09-11 ENCOUNTER — Telehealth: Payer: Self-pay | Admitting: Neurology

## 2018-09-11 VITALS — BP 109/66 | HR 81 | Ht 64.0 in | Wt 167.0 lb

## 2018-09-11 DIAGNOSIS — G43709 Chronic migraine without aura, not intractable, without status migrainosus: Secondary | ICD-10-CM | POA: Diagnosis not present

## 2018-09-11 DIAGNOSIS — M722 Plantar fascial fibromatosis: Secondary | ICD-10-CM

## 2018-09-11 MED ORDER — RIZATRIPTAN BENZOATE 10 MG PO TBDP: 10.0000 mg | ORAL_TABLET | ORAL | 11 refills | Status: DC | PRN

## 2018-09-11 MED FILL — RIZATRIPTAN 10 MG ODT: 10 | 30 days supply | Qty: 9 | Fill #0

## 2018-09-11 NOTE — Telephone Encounter (Signed)
I met with the patient today in office today. She requested to call me back once she arrives to work to schedule.

## 2018-09-11 NOTE — Progress Notes (Signed)
Patient came in today to pick up custom made foot orthotics.  The goals were accomplished and the patient reported no dissatisfaction with said orthotics.  Patient was advised of breakin period and how to report any issues. 

## 2018-09-11 NOTE — Patient Instructions (Addendum)
Restart Botox pending insurance approval Restart rizatriptan (Maxalt)   Migraine Headache A migraine headache is an intense, throbbing pain on one side or both sides of the head. Migraines may also cause other symptoms, such as nausea, vomiting, and sensitivity to light and noise. What are the causes? Doing or taking certain things may also trigger migraines, such as:  Alcohol.  Smoking.  Medicines, such as: ? Medicine used to treat chest pain (nitroglycerine). ? Birth control pills. ? Estrogen pills. ? Certain blood pressure medicines.  Aged cheeses, chocolate, or caffeine.  Foods or drinks that contain nitrates, glutamate, aspartame, or tyramine.  Physical activity. Other things that may trigger a migraine include:  Menstruation.  Pregnancy.  Hunger.  Stress, lack of sleep, too much sleep, or fatigue.  Weather changes. What increases the risk? The following factors may make you more likely to experience migraine headaches:  Age. Risk increases with age.  Family history of migraine headaches.  Being Caucasian.  Depression and anxiety.  Obesity.  Being a woman.  Having a hole in the heart (patent foramen ovale) or other heart problems. What are the signs or symptoms? The main symptom of this condition is pulsating or throbbing pain. Pain may:  Happen in any area of the head, such as on one side or both sides.  Interfere with daily activities.  Get worse with physical activity.  Get worse with exposure to bright lights or loud noises. Other symptoms may include:  Nausea.  Vomiting.  Dizziness.  General sensitivity to bright lights, loud noises, or smells. Before you get a migraine, you may get warning signs that a migraine is developing (aura). An aura may include:  Seeing flashing lights or having blind spots.  Seeing bright spots, halos, or zigzag lines.  Having tunnel vision or blurred vision.  Having numbness or a tingling  feeling.  Having trouble talking.  Having muscle weakness. How is this diagnosed? A migraine headache can be diagnosed based on:  Your symptoms.  A physical exam.  Tests, such as CT scan or MRI of the head. These imaging tests can help rule out other causes of headaches.  Taking fluid from the spine (lumbar puncture) and analyzing it (cerebrospinal fluid analysis, or CSF analysis). How is this treated? A migraine headache is usually treated with medicines that:  Relieve pain.  Relieve nausea.  Prevent migraines from coming back. Treatment may also include:  Acupuncture.  Lifestyle changes like avoiding foods that trigger migraines. Follow these instructions at home: Medicines  Take over-the-counter and prescription medicines only as told by your health care provider.  Do not drive or use heavy machinery while taking prescription pain medicine.  To prevent or treat constipation while you are taking prescription pain medicine, your health care provider may recommend that you: ? Drink enough fluid to keep your urine clear or pale yellow. ? Take over-the-counter or prescription medicines. ? Eat foods that are high in fiber, such as fresh fruits and vegetables, whole grains, and beans. ? Limit foods that are high in fat and processed sugars, such as fried and sweet foods. Lifestyle  Avoid alcohol use.  Do not use any products that contain nicotine or tobacco, such as cigarettes and e-cigarettes. If you need help quitting, ask your health care provider.  Get at least 8 hours of sleep every night.  Limit your stress. General instructions      Keep a journal to find out what may trigger your migraine headaches. For example, write down: ?  What you eat and drink. ? How much sleep you get. ? Any change to your diet or medicines.  If you have a migraine: ? Avoid things that make your symptoms worse, such as bright lights. ? It may help to lie down in a dark, quiet  room. ? Do not drive or use heavy machinery. ? Ask your health care provider what activities are safe for you while you are experiencing symptoms.  Keep all follow-up visits as told by your health care provider. This is important. Contact a health care provider if:  You develop symptoms that are different or more severe than your usual migraine symptoms. Get help right away if:  Your migraine becomes severe.  You have a fever.  You have a stiff neck.  You have vision loss.  Your muscles feel weak or like you cannot control them.  You start to lose your balance often.  You develop trouble walking.  You faint. This information is not intended to replace advice given to you by your health care provider. Make sure you discuss any questions you have with your health care provider. Document Released: 08/08/2005 Document Revised: 02/26/2016 Document Reviewed: 01/25/2016 Elsevier Interactive Patient Education  2019 [employer].

## 2018-09-11 NOTE — Progress Notes (Signed)
[facility]    Provider:  Dr Jaynee Eagles Referring Provider: Carlena Hurl, PA-C Primary Care Physician:  Carlena Hurl, PA-C  CC:  Chronic migraines  Interval History 09/11/2018: She returns for follow up. She had Botox in 12/20018 and reports significant relief. She had an occasional headache/migraine here and there but nothing significant. About three weeks ago headaches returned. She is now having headaches daily. Usually right sided in the frontal region. Usually starts in the mornings. Lights and sounds bother her. She is sensitive to smells. Bending over makes headache worse. Coughing also makes it worse. No difference with lying/sitting/standing. She has taken about '1600mg'$  of ibuprofen daily and recently switched to Excedrin about 3 times a day. She is taking topiramate daily. She has not used abortive therapy. She has tried Imitrex in the past but felt that her throat was closing. She has taken Maxalt in the past with no adverse reactions. She has a history of kidney stones. MRI normal in 06/2016.   HPI 10/2016:  Kelly [name] is a 53 y.o. female here as a follow up for chronic migraines. She has a past medical history of hypertension, hypercholesterolemia, migraines. She's had migraines since the age of 77. She has severe headaches unilaterally on the right with nausea, vomiting. Headaches have been daily for 6 months. She reports light sensitivity, sound sensitivity and smell sensitivity. No medication overuse. No auras.She has had daily migraines. They start later in the morning every day.  They can last 24 hours on the right with throbbing and pulsating on the right perioribital areas with radiation to the back of the head. Smells, sounds,light make it worse. She needs to go into a dark room, lay still which helps. Out of 30 days in a month she has daily headaches and would characterize them all as migrainous. Can be severe. 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 She  has tried multiple medications including currently metoprolol, topiramate, amitriptyline. She has musculoskeletal neck pain with the headache.No other focal neurologic deficits, associated symptoms, inciting events or modifiable factors. Mother had headaches.  Reviewed notes, labs and imaging from outside physicians, which showed:   Personally reviewed images MRI of the brain and agree with the following:  No abnormal lesions are seen on diffusion-weighted views to suggest acute ischemia. The cortical sulci, fissures and cisterns are normal in size and appearance. Lateral, third and fourth ventricle are normal in size and appearance. No extra-axial fluid collections are seen. No evidence of mass effect or midline shift.    On sagittal views the posterior fossa, pituitary gland and corpus callosum are unremarkable. No evidence of intracranial hemorrhage on gradient-echo views. The orbits and their contents, paranasal sinuses and calvarium are unremarkable.  Intracranial flow voids are present.   IMPRESSION:  Normal MRI brain (without).  Esr normal 3, TSH nml 1.8  Review of Systems: Patient complains of symptoms per HPI as well as the following symptoms: headaches, numbness, dizziness, slurred speech, snoring, . Pertinent negatives per HPI. All others negative.   Social History   Socioeconomic History  . Marital status: Single    Spouse name: Not on file  . Number of children: 3  . Years of education: 87  . Highest education level: Not on file  Occupational History    Comment: [employer], pt advocate  Social Needs  . Financial resource strain: Not on file  . Food insecurity:    Worry: Not on file    Inability: Not on  file  . Transportation needs:    Medical: Not on file    Non-medical: Not on file  Tobacco Use  . Smoking status: Current Every Day Smoker    Packs/day: 0.50    Years: 11.00    Pack years: 5.50    Types: Cigarettes  . Smokeless tobacco: Never Used    Substance and Sexual Activity  . Alcohol use: No    Alcohol/week: 0.0 standard drinks  . Drug use: No  . Sexual activity: Yes    Partners: Male    Birth control/protection: I.U.D.    Comment: 1ST intercourse- 18, partners- 7 current partner- 1 year   Lifestyle  . Physical activity:    Days per week: Not on file    Minutes per session: Not on file  . Stress: Not on file  Relationships  . Social connections:    Talks on phone: Not on file    Gets together: Not on file    Attends religious service: Not on file    Active member of club or organization: Not on file    Attends meetings of clubs or organizations: Not on file    Relationship status: Not on file  . Intimate partner violence:    Fear of current or ex partner: Not on file    Emotionally abused: Not on file    Physically abused: Not on file    Forced sexual activity: Not on file  Other Topics Concern  . Not on file  Social History Narrative   Lives at home w/ her children   Right-handed   Caffeine: 1 soda per day    Family History  Problem Relation Age of Onset  . Diabetes Father   . Hypertension Father   . Cancer Father        colon cancer  . Kidney Stones Father   . Heart disease Mother 49       died suddenly of MI  . Heart attack Mother   . Diabetes Sister   . Heart disease Maternal Uncle 47       died of MI  . Heart disease Maternal Uncle   . Breast cancer Maternal Grandmother     Past Medical History:  Diagnosis Date  . Anticardiolipin antibody positive   . HSV-2 (herpes simplex virus 2) infection   . Hyperlipidemia   . Hypertension    diet controlled  . Kidney stone   . Migraine   . Renal calculus, right   . Sleep apnea   . TMJ (dislocation of temporomandibular joint)     Past Surgical History:  Procedure Laterality Date  . CESAREAN SECTION     X3   LAST ONE 11-30-2004  . CYSTOSCOPY W/ RETROGRADES Right 10/29/2012   Procedure: CYSTOSCOPY WITH RETROGRADE PYELOGRAM and stent placement;   Surgeon: Reece Packer, MD;  Location: [facility];  Service: Urology;  Laterality: Right;  rt stent placement , rt retrograde and cysto   . CYSTOSCOPY W/ URETERAL STENT PLACEMENT Right 11/23/2012   Procedure: CYSTOSCOPY WITH STENT REPLACEMENT;  Surgeon: Alexis Frock, MD;  Location: [facility];  Service: Urology;  Laterality: Right;  . CYSTOSCOPY/RETROGRADE/URETEROSCOPY/STONE EXTRACTION WITH BASKET Right 11/23/2012   Procedure: CYSTOSCOPY/RETROGRADE/URETEROSCOPY/STONE EXTRACTION WITH BASKET;  Surgeon: Alexis Frock, MD;  Location: [facility];  Service: Urology;  Laterality: Right;    Current Outpatient Medications  Medication Sig Dispense Refill  . atorvastatin (LIPITOR) 20 MG tablet Take 1 tablet (20 mg total) by mouth  at bedtime. 90 tablet 1  . cholecalciferol (VITAMIN D) 1000 units tablet Take 1 tablet (1,000 Units total) by mouth daily. 90 tablet 0  . ibuprofen (ADVIL,MOTRIN) 200 MG tablet Take 800 mg by mouth as needed for pain. Reported on 11/23/2015    . levonorgestrel (MIRENA) 20 MCG/24HR IUD 1 each by Intrauterine route once.    . meloxicam (MOBIC) 7.5 MG tablet Take 1 tablet (7.5 mg total) by mouth 2 (two) times daily. (Patient taking differently: Take 7.5 mg by mouth daily as needed. ) 60 tablet 1  . metoprolol tartrate (LOPRESSOR) 50 MG tablet TAKE 1 TABLET BY MOUTH 2 TIMES DAILY. 60 tablet 2  . Multiple Vitamin (MULTIVITAMIN WITH MINERALS) TABS Take 1 tablet by mouth daily.    Marland Kitchen topiramate (TOPAMAX) 50 MG tablet TAKE 1 TABLET BY MOUTH 2 TIMES A DAY 60 tablet 1  . rizatriptan (MAXALT-MLT) 10 MG disintegrating tablet Take 1 tablet (10 mg total) by mouth as needed for migraine. May repeat in 2 hours if needed 9 tablet 11   No current facility-administered medications for this visit.     Allergies as of 09/11/2018 - Review Complete 09/11/2018  Allergen Reaction Noted  . Imitrex [sumatriptan base] Other (See Comments)   . Zomig Other  (See Comments) 06/29/2011  . Avelox [moxifloxacin hcl in nacl] Hives 05/25/2011  . Morphine Nausea And Vomiting   . Septra [bactrim] Hives 05/25/2011    Vitals: BP 109/66 (BP Location: Right Arm, Patient Position: Sitting)   Pulse 81   Ht '5\' 4"'$  (1.626 m)   Wt 167 lb (75.8 kg)   BMI 28.67 kg/m  Last Weight:  Wt Readings from Last 1 Encounters:  09/11/18 167 lb (75.8 kg)   Last Height:   Ht Readings from Last 1 Encounters:  09/11/18 '5\' 4"'$  (1.626 m)     Physical exam: Exam: Gen: NAD, conversant, well nourised, obese, well groomed                    Eyes: Conjunctivae clear without exudates or hemorrhage  Neuro: Detailed Neurologic Exam  Speech:    Speech is normal; fluent and spontaneous with normal comprehension.  Cognition:    The patient is oriented to person, place, and time;     recent and remote memory intact;     language fluent;     normal attention, concentration,     fund of knowledge Cranial Nerves:    The pupils are equal, round, and reactive to light. Visual fields are full to finger confrontation. Extraocular movements are intact. Trigeminal sensation is intact and the muscles of mastication are normal. The face is symmetric. The palate elevates in the midline. Hearing intact. Voice is normal. Shoulder shrug is normal. The tongue has normal motion without fasciculations.   Coordination:    No dysmetria  Motor Observation:    No asymmetry, no atrophy, and no involuntary movements noted. Tone:    Normal muscle tone.    Posture:    Posture is normal. normal erect           Assessment/Plan:  53 year old female with chronic migraines without aura not intractable without status migrainosus who was failed multiple medications including amitriptyline, metoprolol and topiramate. Continues to have daily migraines. Had a phenomenal response to botox will reinitiate.  No medication overuse.   Discussed: To prevent or relieve headaches, try the  following: Cool Compress. Lie down and place a cool compress on your head.  Avoid headache triggers. If  certain foods or odors seem to have triggered your migraines in the past, avoid them. A headache diary might help you identify triggers.  Include physical activity in your daily routine. Try a daily walk or other moderate aerobic exercise.  Manage stress. Find healthy ways to cope with the stressors, such as delegating tasks on your to-do list.  Practice relaxation techniques. Try deep breathing, yoga, massage and visualization.  Eat regularly. Eating regularly scheduled meals and maintaining a healthy diet might help prevent headaches. Also, drink plenty of fluids.  Follow a regular sleep schedule. Sleep deprivation might contribute to headaches Consider biofeedback. With this mind-body technique, you learn to control certain bodily functions - such as muscle tension, heart rate and blood pressure - to prevent headaches or reduce headache pain.    Proceed to emergency room if you experience new or worsening symptoms or symptoms do not resolve, if you have new neurologic symptoms or if headache is severe, or for any concerning symptom.    Sarina Ill, MD  [facility] [address] [place], [place] [address]  Phone [phone] Fax [phone]  A total of 25 minutes was spent face-to-face with this patient. Over half this time was spent on counseling patient on the  1. Chronic migraine without aura without status migrainosus, not intractable    diagnosis and different diagnostic and therapeutic options available.

## 2018-09-17 ENCOUNTER — Ambulatory Visit (INDEPENDENT_AMBULATORY_CARE_PROVIDER_SITE_OTHER): Payer: 59 | Admitting: Obstetrics & Gynecology

## 2018-09-17 ENCOUNTER — Encounter: Payer: Self-pay | Admitting: Obstetrics & Gynecology

## 2018-09-17 ENCOUNTER — Telehealth: Payer: Self-pay | Admitting: Neurology

## 2018-09-17 VITALS — BP 120/70 | Ht 63.5 in | Wt 167.7 lb

## 2018-09-17 DIAGNOSIS — Z01419 Encounter for gynecological examination (general) (routine) without abnormal findings: Secondary | ICD-10-CM

## 2018-09-17 DIAGNOSIS — Z30431 Encounter for routine checking of intrauterine contraceptive device: Secondary | ICD-10-CM | POA: Diagnosis not present

## 2018-09-17 DIAGNOSIS — E663 Overweight: Secondary | ICD-10-CM | POA: Diagnosis not present

## 2018-09-17 MED ORDER — PHENTERMINE HCL 37.5 MG PO CAPS: 37.5000 mg | ORAL_CAPSULE | ORAL | 0 refills | Status: DC

## 2018-09-17 MED FILL — PHENTERMINE 37.5 MG TABLET: 37.5 | 90 days supply | Qty: 90 | Fill #0

## 2018-09-17 NOTE — Telephone Encounter (Signed)
Pt is asking for a call to be scheduled for her injection

## 2018-09-17 NOTE — Progress Notes (Signed)
[place] [birth year] [id number]   History:    53 y.o. [id number] Boyfriend x >2 years.    RP:  Established patient presenting for annual gyn exam   HPI: Well on Mirena IUD inserted on 07/11/2018.  Rare mild menses.  No pelvic pain.  No pain with IC.  Breasts normal. Full STI screen neg 05/2017.  Breasts normal.  BMI decreased to 29.24 (32.16 in 05/2017).  Started loosing weight on a lower calorie diet helped by Phentermine x 3 months.  Would like to be on Phentermine x 3 months again.  Physically active, playing volleyball with daughter.  Fasting Health labs here today.  Past medical history,surgical history, family history and social history were all reviewed and documented in the EPIC chart.  Gynecologic History No LMP recorded. (Menstrual status: IUD). Contraception: Mirena IUD x 06/2018 Last Pap: 05/2017. Results were: Negative, HPV HR neg Last mammogram: 07/2017. Results were: Benign Bone Density: Never Colonoscopy: Will schedule now.  Obstetric History OB History  Gravida Para Term Preterm AB Living  _0 SAB TAB Ectopic Multiple Live Births          3    # Outcome Date GA Lbr Len/2nd Weight Sex Delivery Anes PTL Lv  4 AB           3 Term     F CS-Unspec  N LIV  2 Term     F CS-Unspec  N LIV  1 Term     M CS-Unspec  N LIV     ROS: A ROS was performed and pertinent positives and negatives are included in the history.  GENERAL: No fevers or chills. HEENT: No change in vision, no earache, sore throat or sinus congestion. NECK: No pain or stiffness. CARDIOVASCULAR: No chest pain or pressure. No palpitations. PULMONARY: No shortness of breath, cough or wheeze. GASTROINTESTINAL: No abdominal pain, nausea, vomiting or diarrhea, melena or bright red blood per rectum. GENITOURINARY: No urinary frequency, urgency, hesitancy or dysuria. MUSCULOSKELETAL: No joint or muscle pain, no back pain, no recent trauma. DERMATOLOGIC: No rash, no itching, no lesions. ENDOCRINE: No  polyuria, polydipsia, no heat or cold intolerance. No recent change in weight. HEMATOLOGICAL: No anemia or easy bruising or bleeding. NEUROLOGIC: No headache, seizures, numbness, tingling or weakness. PSYCHIATRIC: No depression, no loss of interest in normal activity or change in sleep pattern.     Exam:   BP 120/70   Ht 5' 3.5" (1.613 m)   Wt 167 lb 11.2 oz (76.1 kg)   BMI 29.24 kg/m   Body mass index is 29.24 kg/m.  General appearance : Well developed well nourished female. No acute distress HEENT: Eyes: no retinal hemorrhage or exudates,  Neck supple, trachea midline, no carotid bruits, no thyroidmegaly Lungs: Clear to auscultation, no rhonchi or wheezes, or rib retractions  Heart: Regular rate and rhythm, no murmurs or gallops Breast:Examined in sitting and supine position were symmetrical in appearance, no palpable masses or tenderness,  no skin retraction, no nipple inversion, no nipple discharge, no skin discoloration, no axillary or supraclavicular lymphadenopathy Abdomen: no palpable masses or tenderness, no rebound or guarding Extremities: no edema or skin discoloration or tenderness  Pelvic: Vulva: Normal             Vagina: No gross lesions or discharge  Cervix: No gross lesions or discharge.  Pap reflex done. Strings felt at Exocervix on Bimanual exam.    Uterus  AV, normal  size, shape and consistency, non-tender and mobile  Adnexa  Without masses or tenderness  Anus: Normal   Assessment/Plan:  52 y.o. female for annual exam   1. Encounter for routine gynecological examination with Papanicolaou smear of cervix Normal gynecologic exam.  Pap reflex done.  Breast exam normal.  Last mammogram December 2018 was benign, patient will schedule a screening mammogram now.  Colonoscopy to schedule now.  Fasting health labs here today. - CBC - Comp Met (CMET) - TSH - Lipid panel - VITAMIN D 25 Hydroxy (Vit-D Deficiency, Fractures)  2. Encounter for routine checking of  intrauterine contraceptive device (IUD) Well on Mirena IUD since November 2019.  IUD strings felt on bimanual exam at the exocervix.  3. Overweight (BMI 25.0-29.9) Patient has been successful with phentermine last year to lose weight.  No contraindication to use phentermine again for 1 month, to help patient get started on the lower calorie/carb diet such as [place] diet.  Increase physical activities with aerobic activities 5 times a week and weightlifting every 2 days.  Other orders - phentermine 37.5 MG capsule; Take 1 capsule (37.5 mg total) by mouth every morning.  Counseling on above issues and coordination of care more than 50% for 10 minutes.  Marie-Lyne Lavoie MD, 9:58 AM 09/17/2018   

## 2018-09-18 ENCOUNTER — Other Ambulatory Visit: Payer: Self-pay | Admitting: Obstetrics & Gynecology

## 2018-09-18 DIAGNOSIS — E559 Vitamin D deficiency, unspecified: Secondary | ICD-10-CM

## 2018-09-18 LAB — COMPREHENSIVE METABOLIC PANEL
AG Ratio: 2 (calc) (ref 1.0–2.5)
ALT: 13 U/L (ref 6–29)
AST: 12 U/L (ref 10–35)
Albumin: 4.4 g/dL (ref 3.6–5.1)
Alkaline phosphatase (APISO): 101 U/L (ref 33–130)
BUN: 12 mg/dL (ref 7–25)
CO2: 25 mmol/L (ref 20–32)
Calcium: 9.4 mg/dL (ref 8.6–10.4)
Chloride: 108 mmol/L (ref 98–110)
Creat: 0.69 mg/dL (ref 0.50–1.05)
Globulin: 2.2 g/dL (calc) (ref 1.9–3.7)
Glucose, Bld: 80 mg/dL (ref 65–99)
Potassium: 4 mmol/L (ref 3.5–5.3)
Sodium: 140 mmol/L (ref 135–146)
Total Bilirubin: 0.7 mg/dL (ref 0.2–1.2)
Total Protein: 6.6 g/dL (ref 6.1–8.1)

## 2018-09-18 LAB — CBC
HCT: 38.7 % (ref 35.0–45.0)
Hemoglobin: 13.4 g/dL (ref 11.7–15.5)
MCH: 32.3 pg (ref 27.0–33.0)
MCHC: 34.6 g/dL (ref 32.0–36.0)
MCV: 93.3 fL (ref 80.0–100.0)
MPV: 9.5 fL (ref 7.5–12.5)
Platelets: 321 10*3/uL (ref 140–400)
RBC: 4.15 10*6/uL (ref 3.80–5.10)
RDW: 12.1 % (ref 11.0–15.0)
WBC: 7.5 10*3/uL (ref 3.8–10.8)

## 2018-09-18 LAB — TSH: TSH: 1.93 mIU/L

## 2018-09-18 LAB — LIPID PANEL
Cholesterol: 151 mg/dL (ref <200)
HDL: 56 mg/dL (ref >50)
LDL Cholesterol (Calc): 75 mg/dL (calc)
Non-HDL Cholesterol (Calc): 95 mg/dL (calc) (ref <130)
Total CHOL/HDL Ratio: 2.7 (calc) (ref <5.0)
Triglycerides: 115 mg/dL (ref <150)

## 2018-09-18 LAB — VITAMIN D 25 HYDROXY (VIT D DEFICIENCY, FRACTURES): Vit D, 25-Hydroxy: 23 ng/mL — ABNORMAL LOW (ref 30–100)

## 2018-09-18 MED ORDER — VITAMIN D (ERGOCALCIFEROL) 1.25 MG (50000 UNIT) PO CAPS: 50000.0000 [IU] | ORAL_CAPSULE | ORAL | 0 refills | Status: DC

## 2018-09-18 MED FILL — VIT D2 1.25 MG (50,000 UNIT: 1.25 MG | 84 days supply | Qty: 12 | Fill #0

## 2018-09-18 NOTE — Telephone Encounter (Signed)
I called and scheduled the patient. DW  °

## 2018-09-19 LAB — PAP IG W/ RFLX HPV ASCU

## 2018-09-19 NOTE — Telephone Encounter (Signed)
Pt has called for Danielle to r/s her Botox.  She is asking for a call back

## 2018-09-20 ENCOUNTER — Telehealth: Payer: Self-pay | Admitting: Neurology

## 2018-09-20 NOTE — Telephone Encounter (Signed)
I called to reschedule the patient she states that she has prior arrangements. She needed to reschedule. I offered her the 11th at 11:30 and the patient declined. She stated that Dr. Jaynee Eagles her she could come in anytime she wanted and that Dr. Jaynee Eagles suggested Friday at 7:30am. I told the patient I would send a message over to the nurse asking her to work her in on February 7th at 7:30am. Please make apt.

## 2018-09-20 NOTE — Telephone Encounter (Signed)
Pt would like to schedule a BOTOX appt. Pt was offered first available and she states she can not wait that far. Please advise.

## 2018-09-20 NOTE — Telephone Encounter (Signed)
Appointment scheduled. Please let pt know that she will need to check in right at @ 07:30 when the office lobby opens.

## 2018-09-20 NOTE — Telephone Encounter (Signed)
I called the patient but she did not answer so I left a VM asking her to call back.  °

## 2018-09-21 ENCOUNTER — Encounter: Payer: Self-pay | Admitting: *Deleted

## 2018-09-21 NOTE — Telephone Encounter (Signed)
Patient is already aware of apt. DW

## 2018-09-23 ENCOUNTER — Encounter: Payer: Self-pay | Admitting: Obstetrics & Gynecology

## 2018-09-23 NOTE — Patient Instructions (Signed)
1. Encounter for routine gynecological examination with Papanicolaou smear of cervix Normal gynecologic exam.  Pap reflex done.  Breast exam normal.  Last mammogram December 2018 was benign, patient will schedule a screening mammogram now.  Colonoscopy to schedule now.  Fasting health labs here today. - CBC - Comp Met (CMET) - TSH - Lipid panel - VITAMIN D 25 Hydroxy (Vit-D Deficiency, Fractures)  2. Encounter for routine checking of intrauterine contraceptive device (IUD) Well on Mirena IUD since November 2019.  IUD strings felt on bimanual exam at the exocervix.  3. Overweight (BMI 25.0-29.9) Patient has been successful with phentermine last year to lose weight.  No contraindication to use phentermine again for 1 month, to help patient get started on the lower calorie/carb diet such as [employer].  Increase physical activities with aerobic activities 5 times a week and weightlifting every 2 days.  Other orders - phentermine 37.5 MG capsule; Take 1 capsule (37.5 mg total) by mouth every morning.  Kelly [name], it was a pleasure seeing you today!  I will inform you of your results as soon as they are available.

## 2018-09-25 NOTE — Telephone Encounter (Signed)
Patient called and spoke with Sandi C. In the phone room. She wanted to know if we could get her in sooner. She was offered Thursday at 11:30. She stated she would have to call back and let us know. She still has current apt.

## 2018-09-25 NOTE — Telephone Encounter (Signed)
I called and checked pre cert requirements for [phone] and [phone]. [place] [facility]#[id number]. DW

## 2018-09-25 NOTE — Telephone Encounter (Signed)
Per Dr. Jaynee Eagles I called and told the patient to keep her Friday apt. She did not answer so I left a VM (ok per DPR).

## 2018-09-26 ENCOUNTER — Ambulatory Visit: Payer: 59 | Admitting: Neurology

## 2018-09-28 ENCOUNTER — Ambulatory Visit (INDEPENDENT_AMBULATORY_CARE_PROVIDER_SITE_OTHER): Payer: 59 | Admitting: Neurology

## 2018-09-28 DIAGNOSIS — G43709 Chronic migraine without aura, not intractable, without status migrainosus: Secondary | ICD-10-CM

## 2018-09-28 NOTE — Progress Notes (Signed)
Consent Form Botulism Toxin Injection For Chronic Migraine   09/28/2018: This is our first botox. Patient at baseline has daily headaches/migraines. +OO.  Reviewed orally with patient, additionally signature is on file:  Botulism toxin has been approved by the Federal drug administration for treatment of chronic migraine. Botulism toxin does not cure chronic migraine and it may not be effective in some patients.  The administration of botulism toxin is accomplished by injecting a small amount of toxin into the muscles of the neck and head. Dosage must be titrated for each individual. Any benefits resulting from botulism toxin tend to wear off after 3 months with a repeat injection required if benefit is to be maintained. Injections are usually done every 3-4 months with maximum effect peak achieved by about 2 or 3 weeks. Botulism toxin is expensive and you should be sure of what costs you will incur resulting from the injection.  The side effects of botulism toxin use for chronic migraine may include:   -Transient, and usually mild, facial weakness with facial injections  -Transient, and usually mild, head or neck weakness with head/neck injections  -Reduction or loss of forehead facial animation due to forehead muscle weakness  -Eyelid drooping  -Dry eye  -Pain at the site of injection or bruising at the site of injection  -Double vision  -Potential unknown long term risks  Contraindications: You should not have Botox if you are pregnant, nursing, allergic to albumin, have an infection, skin condition, or muscle weakness at the site of the injection, or have myasthenia gravis, Lambert-Eaton syndrome, or ALS.  It is also possible that as with any injection, there may be an allergic reaction or no effect from the medication. Reduced effectiveness after repeated injections is sometimes seen and rarely infection at the injection site may occur. All care will be taken to prevent these side  effects. If therapy is given over a long time, atrophy and wasting in the muscle injected may occur. Occasionally the patient's become refractory to treatment because they develop antibodies to the toxin. In this event, therapy needs to be modified.  I have read the above information and consent to the administration of botulism toxin.    BOTOX PROCEDURE NOTE FOR MIGRAINE HEADACHE    Contraindications and precautions discussed with patient(above). Aseptic procedure was observed and patient tolerated procedure. Procedure performed by Dr. Georgia Dom  The condition has existed for more than 6 months, and pt does not have a diagnosis of ALS, Myasthenia Gravis or Lambert-Eaton Syndrome.  Risks and benefits of injections discussed and pt agrees to proceed with the procedure.  Written consent obtained  These injections are medically necessary. Pt  receives good benefits from these injections. These injections do not cause sedations or hallucinations which the oral therapies may cause.  Description of procedure:  The patient was placed in a sitting position. The standard protocol was used for Botox as follows, with 5 units of Botox injected at each site:   -Procerus muscle, midline injection  -Corrugator muscle, bilateral injection  -Frontalis muscle, bilateral injection, with 2 sites each side, medial injection was performed in the upper one third of the frontalis muscle, in the region vertical from the medial inferior edge of the superior orbital rim. The lateral injection was again in the upper one third of the forehead vertically above the lateral limbus of the cornea, 1.5 cm lateral to the medial injection site.  -Temporalis muscle injection, 5 sites, bilaterally. The first injection was 3 cm  above the tragus of the ear, second injection site was 1.5 cm to 3 cm up from the first injection site in line with the tragus of the ear. The third injection site was 1.5-3 cm forward between the first  2 injection sites. The fourth injection site was 1.5 cm posterior to the second injection site.   - Patient feels the migraines are centered around the eyes +5 units bilaterally at the outer canthus in the orbicularis occuli  -Occipitalis muscle injection, 3 sites, bilaterally. The first injection was done one half way between the occipital protuberance and the tip of the mastoid process behind the ear. The second injection site was done lateral and superior to the first, 1 fingerbreadth from the first injection. The third injection site was 1 fingerbreadth superiorly and medially from the first injection site.  -Cervical paraspinal muscle injection, 2 sites, bilateral knee first injection site was 1 cm from the midline of the cervical spine, 3 cm inferior to the lower border of the occipital protuberance. The second injection site was 1.5 cm superiorly and laterally to the first injection site.  -Trapezius muscle injection was performed at 3 sites, bilaterally. The first injection site was in the upper trapezius muscle halfway between the inflection point of the neck, and the acromion. The second injection site was one half way between the acromion and the first injection site. The third injection was done between the first injection site and the inflection point of the neck.   Will return for repeat injection in 3 months.   A 200 unit sof Botox was used, any Botox not injected was wasted. The patient tolerated the procedure well, there were no complications of the above procedure.

## 2018-09-28 NOTE — Progress Notes (Signed)
Botox- 100 units x 2 vials Lot: [id number] Expiration 01/2021 NDC: 5465-0354-65  Bacteriostatic 0.9% Sodium Chloride- 2mL total Lot: [id number] Expiration: 05/23/2019 NDC: 1700-1749-44  Dx: B/B H67.591

## 2018-10-15 ENCOUNTER — Other Ambulatory Visit: Payer: Self-pay | Admitting: Medical

## 2018-10-15 MED FILL — ATORVASTATIN 20 MG TABLET: 20 | 90 days supply | Qty: 90 | Fill #0

## 2018-10-30 DIAGNOSIS — Z1231 Encounter for screening mammogram for malignant neoplasm of breast: Secondary | ICD-10-CM | POA: Diagnosis not present

## 2018-11-02 ENCOUNTER — Encounter: Payer: Self-pay | Admitting: Medical

## 2018-11-02 ENCOUNTER — Ambulatory Visit: Payer: 59 | Admitting: Medical

## 2018-11-02 ENCOUNTER — Other Ambulatory Visit: Payer: Self-pay

## 2018-11-02 VITALS — BP 140/70 | HR 90 | Temp 98.3°F | Resp 16

## 2018-11-02 DIAGNOSIS — R52 Pain, unspecified: Secondary | ICD-10-CM | POA: Diagnosis not present

## 2018-11-02 DIAGNOSIS — M542 Cervicalgia: Secondary | ICD-10-CM | POA: Diagnosis not present

## 2018-11-02 DIAGNOSIS — M79672 Pain in left foot: Secondary | ICD-10-CM

## 2018-11-02 DIAGNOSIS — M722 Plantar fascial fibromatosis: Secondary | ICD-10-CM | POA: Diagnosis not present

## 2018-11-02 DIAGNOSIS — Z79899 Other long term (current) drug therapy: Secondary | ICD-10-CM | POA: Insufficient documentation

## 2018-11-02 DIAGNOSIS — L568 Other specified acute skin changes due to ultraviolet radiation: Secondary | ICD-10-CM | POA: Diagnosis not present

## 2018-11-02 DIAGNOSIS — R760 Raised antibody titer: Secondary | ICD-10-CM | POA: Diagnosis not present

## 2018-11-02 DIAGNOSIS — Z8669 Personal history of other diseases of the nervous system and sense organs: Secondary | ICD-10-CM | POA: Diagnosis not present

## 2018-11-02 DIAGNOSIS — F17200 Nicotine dependence, unspecified, uncomplicated: Secondary | ICD-10-CM

## 2018-11-02 DIAGNOSIS — M79671 Pain in right foot: Secondary | ICD-10-CM

## 2018-11-02 NOTE — Progress Notes (Signed)
Subjective:     Patient ID: Kelly [name], female   DOB: [birth year], 53 y.o.   MRN: [id number]  HPI Chief Complaint  Patient presents with  . consult    consult   Here for pains.  Every day stays in pain, awakes in pain daily.  Hurts to walk every morning.  Riding in the car for 30 minutes hurts.   Has known plantar fascitis.  Is active, exercises, but in past year has unusual pains.  She is worried about lupus, hx/o sun sensitivity rash, has nose rash for years, attributed to CPAP, but gets recurring sun rash on chest.   Gets recurrent sores in nose.  For years has had low grade fevers.  Takes ibuprofen every day for months.  Sometimes takes Meloxicam, but not daily and not overlapping same day with ibuprofen.  Gets legs swelling, pains in entire legs daily.   Gets pains in hands, legs, knees, feet tremendously.   Neck hurts sometimes, but attributes this to her migraines.   No joint swelling other than general leg swelling.   Has hair loss /thinning.  Easily bruises.  Just recently started back on phentermine, but hasn't been on this of late. No other aggravating or relieving factors. No other complaint.   Past Medical History:  Diagnosis Date  . Anticardiolipin antibody positive   . HSV-2 (herpes simplex virus 2) infection   . Hyperlipidemia   . Hypertension    diet controlled  . Kidney stone   . Migraine   . Renal calculus, right   . Sleep apnea   . TMJ (dislocation of temporomandibular joint)     Current Outpatient Medications on File Prior to Visit  Medication Sig Dispense Refill  . ibuprofen (ADVIL,MOTRIN) 200 MG tablet Take 800 mg by mouth as needed for pain. Reported on 11/23/2015    . levonorgestrel (MIRENA) 20 MCG/24HR IUD 1 each by Intrauterine route once.    . meloxicam (MOBIC) 7.5 MG tablet Take 1 tablet (7.5 mg total) by mouth 2 (two) times daily. (Patient taking differently: Take 7.5 mg by mouth daily as needed. ) 60 tablet 1  . metoprolol tartrate (LOPRESSOR) 50 MG  tablet TAKE 1 TABLET BY MOUTH 2 TIMES DAILY. 60 tablet 2  . Multiple Vitamin (MULTIVITAMIN WITH MINERALS) TABS Take 1 tablet by mouth daily.    . phentermine 37.5 MG capsule Take 1 capsule (37.5 mg total) by mouth every morning. 90 capsule 0  . rizatriptan (MAXALT-MLT) 10 MG disintegrating tablet Take 1 tablet (10 mg total) by mouth as needed for migraine. May repeat in 2 hours if needed 9 tablet 11  . topiramate (TOPAMAX) 50 MG tablet TAKE 1 TABLET BY MOUTH 2 TIMES A DAY 60 tablet 1  . Vitamin D, Ergocalciferol, (DRISDOL) 1.25 MG (50000 UT) CAPS capsule Take 1 capsule (50,000 Units total) by mouth every 7 (seven) days. 12 capsule 0   No current facility-administered medications on file prior to visit.     Review of Systems As in subjective     Objective:   Physical Exam BP 140/70   Pulse 90   Temp 98.3 F (36.8 C) (Oral)   Resp 16   SpO2 98%   General appearance: alert, no distress, WD/WN,  Neck: supple, no lymphadenopathy, no thyromegaly, no masses Heart: RRR, normal S1, S2, no murmurs Lungs: CTA bilaterally, no wheezes, rhonchi, or rales Hand squeeze test negative, no obvious bony changes of the hands or hand swelling, or nontender in general, no obvious  deformity or swelling throughout upper lower extremities Neuro: Normal strength and sensation upper and lower extremities, normal DTRs Pulses: 2+ symmetric, upper and lower extremities, normal cap refill       Assessment:     Encounter Diagnoses  Name Primary?  . Generalized pain Yes  . Photosensitization due to sun   . Plantar fasciitis   . Current smoker   . Anticardiolipin antibody positive   . Heel pain, bilateral   . History of migraine   . NECK PAIN   . High risk medication use        Plan:     We discussed possible causes of her symptoms. STOP Lipitor for the next 2 weeks to see if this helps Screening for tuberculosis Other labs as below Continue with podiatry for plantar fasciitis  Ahja was seen  today for consult.  Diagnoses and all orders for this visit:  Generalized pain -     Anti-DNA antibody, double-stranded -     Rheumatoid factor -     CK -     CYCLIC CITRUL PEPTIDE ANTIBODY, IGG/IGA -     Uric acid -     ANA -     Cancel: ANA Comprehensive Panel -     QuantiFERON-TB Gold Plus  Photosensitization due to sun -     Anti-DNA antibody, double-stranded -     Rheumatoid factor -     CK -     CYCLIC CITRUL PEPTIDE ANTIBODY, IGG/IGA -     Uric acid -     ANA -     Cancel: ANA Comprehensive Panel -     QuantiFERON-TB Gold Plus  Plantar fasciitis -     Anti-DNA antibody, double-stranded -     Rheumatoid factor -     CK -     CYCLIC CITRUL PEPTIDE ANTIBODY, IGG/IGA -     Uric acid -     ANA -     Cancel: ANA Comprehensive Panel -     QuantiFERON-TB Gold Plus  Current smoker -     Anti-DNA antibody, double-stranded -     Rheumatoid factor -     CK -     CYCLIC CITRUL PEPTIDE ANTIBODY, IGG/IGA -     Uric acid -     ANA -     Cancel: ANA Comprehensive Panel -     QuantiFERON-TB Gold Plus  Anticardiolipin antibody positive -     Anti-DNA antibody, double-stranded -     Rheumatoid factor -     CK -     CYCLIC CITRUL PEPTIDE ANTIBODY, IGG/IGA -     Uric acid -     ANA -     Cancel: ANA Comprehensive Panel -     QuantiFERON-TB Gold Plus  Heel pain, bilateral -     Anti-DNA antibody, double-stranded -     Rheumatoid factor -     CK -     CYCLIC CITRUL PEPTIDE ANTIBODY, IGG/IGA -     Uric acid -     ANA -     Cancel: ANA Comprehensive Panel -     QuantiFERON-TB Gold Plus  History of migraine -     Anti-DNA antibody, double-stranded -     Rheumatoid factor -     CK -     CYCLIC CITRUL PEPTIDE ANTIBODY, IGG/IGA -     Uric acid -     ANA -     Cancel: ANA Comprehensive Panel -  QuantiFERON-TB Gold Plus  NECK PAIN -     Anti-DNA antibody, double-stranded -     Rheumatoid factor -     CK -     CYCLIC CITRUL PEPTIDE ANTIBODY, IGG/IGA -     Uric  acid -     ANA -     Cancel: ANA Comprehensive Panel -     QuantiFERON-TB Gold Plus  High risk medication use -     Anti-DNA antibody, double-stranded -     Rheumatoid factor -     CK -     CYCLIC CITRUL PEPTIDE ANTIBODY, IGG/IGA -     Uric acid -     ANA -     Cancel: ANA Comprehensive Panel -     QuantiFERON-TB Gold Plus

## 2018-11-05 LAB — CK: Total CK: 96 U/L (ref 24–173)

## 2018-11-05 LAB — RHEUMATOID FACTOR: Rheumatoid fact SerPl-aCnc: <10 IU/mL (ref 0.0–13.9)

## 2018-11-05 LAB — QUANTIFERON-TB GOLD PLUS
QuantiFERON Mitogen Value: >10 IU/mL
QuantiFERON Nil Value: 0.01 IU/mL
QuantiFERON TB1 Ag Value: 0.01 IU/mL
QuantiFERON TB2 Ag Value: 0.01 IU/mL
QuantiFERON-TB Gold Plus: NEGATIVE

## 2018-11-05 LAB — ANA: Anti Nuclear Antibody(ANA): NEGATIVE

## 2018-11-05 LAB — ANTI-DNA ANTIBODY, DOUBLE-STRANDED: dsDNA Ab: <1 IU/mL (ref 0–9)

## 2018-11-05 LAB — CYCLIC CITRUL PEPTIDE ANTIBODY, IGG/IGA: Cyclic Citrullin Peptide Ab: 8 units (ref 0–19)

## 2018-11-05 LAB — URIC ACID: Uric Acid: 4.7 mg/dL (ref 2.5–7.1)

## 2018-11-13 ENCOUNTER — Other Ambulatory Visit: Payer: Self-pay | Admitting: Medical

## 2018-11-13 MED FILL — METOPROLOL TARTRATE 50 MG T: 50 | 30 days supply | Qty: 60 | Fill #0

## 2018-11-13 NOTE — Telephone Encounter (Signed)
Is this okay to refill? 

## 2018-11-15 MED FILL — TOPIRAMATE 50 MG TABLET: 50 | 30 days supply | Qty: 60 | Fill #0

## 2018-11-19 ENCOUNTER — Other Ambulatory Visit: Payer: Self-pay

## 2018-11-19 ENCOUNTER — Ambulatory Visit (INDEPENDENT_AMBULATORY_CARE_PROVIDER_SITE_OTHER): Payer: 59 | Admitting: Medical

## 2018-11-19 VITALS — BP 140/90 | HR 80 | Temp 97.7°F | Resp 16

## 2018-11-19 DIAGNOSIS — I159 Secondary hypertension, unspecified: Secondary | ICD-10-CM

## 2018-11-19 DIAGNOSIS — R350 Frequency of micturition: Secondary | ICD-10-CM

## 2018-11-19 DIAGNOSIS — N3281 Overactive bladder: Secondary | ICD-10-CM | POA: Diagnosis not present

## 2018-11-19 LAB — POCT URINALYSIS DIP (PROADVANTAGE DEVICE)
Bilirubin, UA: NEGATIVE
Blood, UA: NEGATIVE
Glucose, UA: NEGATIVE mg/dL
Ketones, POC UA: NEGATIVE mg/dL
Leukocytes, UA: NEGATIVE
Nitrite, UA: NEGATIVE
Protein Ur, POC: NEGATIVE mg/dL
Specific Gravity, Urine: 1.025
Urobilinogen, Ur: NEGATIVE
pH, UA: 6 (ref 5.0–8.0)

## 2018-11-19 MED ORDER — MIRABEGRON ER 25 MG PO TB24: 25.0000 mg | ORAL_TABLET | Freq: Every day | ORAL | 0 refills | Status: DC

## 2018-11-19 NOTE — Patient Instructions (Signed)
Kegel Exercises  Kegel exercises help strengthen the muscles that support the rectum, vagina, small intestine, bladder, and uterus. Doing Kegel exercises can help:   Improve bladder and bowel control.   Improve sexual response.   Reduce problems and discomfort during pregnancy.  Kegel exercises involve squeezing your pelvic floor muscles, which are the same muscles you squeeze when you try to stop the flow of urine. The exercises can be done while sitting, standing, or lying down, but it is best to vary your position.  Exercises  1. Squeeze your pelvic floor muscles tight. You should feel a tight lift in your rectal area. If you are a female, you should also feel a tightness in your vaginal area. Keep your stomach, buttocks, and legs relaxed.  2. Hold the muscles tight for up to 10 seconds.  3. Relax your muscles.  Repeat this exercise 50 times a day or as many times as told by your health care provider. Continue to do this exercise for at least 4-6 weeks or for as long as told by your health care provider.  This information is not intended to replace advice given to you by your health care provider. Make sure you discuss any questions you have with your health care provider.  Document Released: 07/25/2012 Document Revised: 12/19/2016 Document Reviewed: 06/28/2015  Elsevier Interactive Patient Education  2019 [employer]

## 2018-11-19 NOTE — Progress Notes (Signed)
Subjective: Chief Complaint  Patient presents with  . uti    uti. urine frequency    Here for UTI.   Having urgency, urinary frequency x 2.5 days, some pain with urination.  Not severe or burning pain, but discomfort with urination.   No urine odor.  No blood in urine.  No nausea, no vomiting.  Some back pain.   No lower belly pressure.  No fever.   No bowel changes, no constipating, no diarrhea.   No changes in caffeine.   No vaginal discharge.  No concern for STD.     Has had problems with overactive bladder in the past.  Had tried Myrbetriq this past year for OAB and this helped.  After a while she stopped this as the problem improved.  No recent changes in hygiene products.     HTN - attributes elevate BP to stress. Compliant with Metoprolol.  Has been backing off Phentermine of late.    Saw me recently for pain issues.  Stopped statin temporarily due to pains to see if this would help, and it has not changed anything.    No other aggravating or relieving factors. No other complaint.  Past Medical History:  Diagnosis Date  . Anticardiolipin antibody positive   . HSV-2 (herpes simplex virus 2) infection   . Hyperlipidemia   . Hypertension    diet controlled  . Kidney stone   . Migraine   . Renal calculus, right   . Sleep apnea   . TMJ (dislocation of temporomandibular joint)    Current Outpatient Medications on File Prior to Visit  Medication Sig Dispense Refill  . ibuprofen (ADVIL,MOTRIN) 200 MG tablet Take 800 mg by mouth as needed for pain. Reported on 11/23/2015    . levonorgestrel (MIRENA) 20 MCG/24HR IUD 1 each by Intrauterine route once.    . meloxicam (MOBIC) 7.5 MG tablet Take 1 tablet (7.5 mg total) by mouth 2 (two) times daily. (Patient taking differently: Take 7.5 mg by mouth daily as needed. ) 60 tablet 1  . metoprolol tartrate (LOPRESSOR) 50 MG tablet TAKE 1 TABLET BY MOUTH 2 TIMES DAILY. 60 tablet 2  . Multiple Vitamin (MULTIVITAMIN WITH MINERALS) TABS Take 1  tablet by mouth daily.    . phentermine 37.5 MG capsule Take 1 capsule (37.5 mg total) by mouth every morning. 90 capsule 0  . rizatriptan (MAXALT-MLT) 10 MG disintegrating tablet Take 1 tablet (10 mg total) by mouth as needed for migraine. May repeat in 2 hours if needed 9 tablet 11  . topiramate (TOPAMAX) 50 MG tablet TAKE 1 TABLET BY MOUTH 2 TIMES A DAY 60 tablet 1  . Vitamin D, Ergocalciferol, (DRISDOL) 1.25 MG (50000 UT) CAPS capsule Take 1 capsule (50,000 Units total) by mouth every 7 (seven) days. 12 capsule 0   No current facility-administered medications on file prior to visit.      ROS as in subjective   Objective: BP 140/90   Pulse 80   Temp 97.7 F (36.5 C) (Oral)   Resp 16   SpO2 99%   BP Readings from Last 3 Encounters:  11/19/18 140/90  11/02/18 140/70  09/17/18 120/70   Wt Readings from Last 3 Encounters:  09/17/18 167 lb 11.2 oz (76.1 kg)  09/11/18 167 lb (75.8 kg)  05/10/18 165 lb 9.6 oz (75.1 kg)    Gen: wd, wn, nad Abdomen: +bs, soft, mild lower generalized tenderness, no organomegaly    Assessment: Encounter Diagnoses  Name Primary?  Marland Kitchen  Urinary frequency Yes  . Overactive bladder   . Secondary hypertension      Plan: Symptoms and exam suggest overactive bladder.  She did well Myrbetriq last year but stopped this after several months.  Restart Myrbetriq.  Discussed Kegel exercises.  Urine sent for culture to rule out infection.  Samples given of Myrbetriq.  She will let me know within a week whether this is helping or not  HTN - c/t current medication, hold off on phentermine for now, work on stress reduction where possible.  Kelly [name] was seen today for uti.  Diagnoses and all orders for this visit:  Urinary frequency -     POCT Urinalysis DIP (Proadvantage Device) -     Urine Culture -     Cancel: Urine Culture  Overactive bladder  Secondary hypertension  Other orders -     mirabegron ER (MYRBETRIQ) 25 MG TB24 tablet; Take 1 tablet (25  mg total) by mouth daily.

## 2018-11-20 ENCOUNTER — Ambulatory Visit: Payer: 59

## 2018-11-20 LAB — URINE CULTURE: Organism ID, Bacteria: NO GROWTH

## 2018-11-21 ENCOUNTER — Encounter: Payer: Self-pay | Admitting: Podiatry

## 2018-11-23 ENCOUNTER — Other Ambulatory Visit: Payer: Self-pay | Admitting: Podiatry

## 2018-11-23 MED ORDER — METHYLPREDNISOLONE 4 MG PO TBPK: ORAL_TABLET | ORAL | 0 refills | Status: DC

## 2018-11-23 MED FILL — METHYLPREDNISOLONE 4 MG TBP: 4 | 6 days supply | Qty: 21 | Fill #0

## 2018-11-23 NOTE — Progress Notes (Signed)
Medrol dose pack prescribed 

## 2018-12-10 ENCOUNTER — Other Ambulatory Visit: Payer: Self-pay | Admitting: Medical

## 2018-12-10 MED FILL — METOPROLOL TARTRATE 50 MG T: 50 | 30 days supply | Qty: 60 | Fill #0

## 2018-12-10 MED FILL — TOPIRAMATE 50 MG TABLET: 50 | 30 days supply | Qty: 60 | Fill #1

## 2018-12-19 ENCOUNTER — Ambulatory Visit: Payer: 59

## 2018-12-25 ENCOUNTER — Telehealth: Payer: Self-pay | Admitting: Neurology

## 2018-12-25 NOTE — Telephone Encounter (Signed)
I called and spoke with the patient regarding rescheduling her apt. She stated that she is going to hold off on her injections until she feels comfortable coming into the office. She will call us back to schedule at a later time.

## 2018-12-27 ENCOUNTER — Encounter

## 2018-12-27 ENCOUNTER — Ambulatory Visit (INDEPENDENT_AMBULATORY_CARE_PROVIDER_SITE_OTHER): Payer: Self-pay | Admitting: Podiatry

## 2018-12-27 ENCOUNTER — Other Ambulatory Visit: Payer: Self-pay

## 2018-12-27 DIAGNOSIS — M79676 Pain in unspecified toe(s): Secondary | ICD-10-CM

## 2018-12-27 DIAGNOSIS — M722 Plantar fascial fibromatosis: Secondary | ICD-10-CM

## 2018-12-27 NOTE — Progress Notes (Signed)
Patient is here today with complaint of left foot pain.  This is been ongoing for several months.  She states that both her feet hurt but the left is the worst.  She has tried oral inflamed anti-inflammatories, injections, night splints, and inserts.  She has had no relief in pain so far.  Pain on palpation to left heel.  ESWT administered to 3.3 J and tolerated well.  Advised her to avoid NSAIDs and ice, and utilize her boot 2 to 3 days posttreatment.  She is to follow-up next week for second treatment.

## 2018-12-28 ENCOUNTER — Ambulatory Visit: Payer: Medicaid Other | Admitting: Neurology

## 2019-01-03 ENCOUNTER — Ambulatory Visit: Payer: 59

## 2019-01-08 ENCOUNTER — Ambulatory Visit (INDEPENDENT_AMBULATORY_CARE_PROVIDER_SITE_OTHER): Payer: Self-pay | Admitting: Podiatry

## 2019-01-08 ENCOUNTER — Other Ambulatory Visit: Payer: Self-pay

## 2019-01-08 DIAGNOSIS — M79676 Pain in unspecified toe(s): Secondary | ICD-10-CM

## 2019-01-08 DIAGNOSIS — M722 Plantar fascial fibromatosis: Secondary | ICD-10-CM

## 2019-01-08 NOTE — Progress Notes (Signed)
Patient is here today with complaint of left foot pain.  This is been ongoing for several months.  She states that both her feet hurt but the left is the worst.  She has tried oral inflamed anti-inflammatories, injections, night splints, and inserts.  She has had no relief in pain so far.  Pain on palpation to left heel.  ESWT administered to 5.5 J and tolerated well.  Advised her to avoid NSAIDs and ice, and utilize her boot 2 to 3 days posttreatment.  She is to follow-up next week for third treatment.

## 2019-01-16 ENCOUNTER — Other Ambulatory Visit: Payer: Self-pay

## 2019-01-16 ENCOUNTER — Ambulatory Visit (INDEPENDENT_AMBULATORY_CARE_PROVIDER_SITE_OTHER): Payer: Self-pay | Admitting: Podiatry

## 2019-01-16 DIAGNOSIS — M722 Plantar fascial fibromatosis: Secondary | ICD-10-CM

## 2019-01-16 DIAGNOSIS — M79676 Pain in unspecified toe(s): Secondary | ICD-10-CM

## 2019-01-17 NOTE — Progress Notes (Signed)
Patient is here today with complaint of left foot pain.  This is been ongoing for several months.  She states that both her feet hurt but the left is the worst.  She has tried oral inflamed anti-inflammatories, injections, night splints, and inserts.  She has had no relief in pain so far.  Patient states that her left foot still hurting her, she did have some pain relief after the first treatment, but the second treatment she has not had as much relief.  She also states that her right foot hurts on the lateral aspect and this is happened recently.  Pain on palpation to left heel.  ESWT administered to 10.5 J and tolerated well.  Advised her to avoid NSAIDs and ice, and utilize her boot 2 to 3 days posttreatment.  She is to follow up in 2 weeks for fourth treatment.  I did discuss with her in regards to her right foot pain that this could possibly be compensation pain, but if the pain increased or worsened between now and her next visit she is to call and get an appointment.

## 2019-01-28 ENCOUNTER — Other Ambulatory Visit: Payer: Self-pay | Admitting: Medical

## 2019-01-28 MED FILL — METOPROLOL TARTRATE 50 MG T: 50 | 30 days supply | Qty: 60 | Fill #1

## 2019-01-28 MED FILL — TOPIRAMATE 50 MG TABLET: 50 | 30 days supply | Qty: 60 | Fill #0

## 2019-01-31 ENCOUNTER — Other Ambulatory Visit: Payer: 59

## 2019-02-08 ENCOUNTER — Ambulatory Visit: Payer: 59 | Admitting: Family Medicine

## 2019-02-08 ENCOUNTER — Telehealth: Payer: 59 | Admitting: Family

## 2019-02-08 ENCOUNTER — Other Ambulatory Visit: Payer: Self-pay

## 2019-02-08 DIAGNOSIS — B9689 Other specified bacterial agents as the cause of diseases classified elsewhere: Secondary | ICD-10-CM | POA: Diagnosis not present

## 2019-02-08 DIAGNOSIS — Z720 Tobacco use: Secondary | ICD-10-CM

## 2019-02-08 DIAGNOSIS — J019 Acute sinusitis, unspecified: Secondary | ICD-10-CM

## 2019-02-08 MED ORDER — AMOXICILLIN-POT CLAVULANATE 875-125 MG PO TABS: 1.0000 | ORAL_TABLET | Freq: Two times a day (BID) | ORAL | 0 refills | Status: DC

## 2019-02-08 MED FILL — AMOX-CLAV 875-125 MG TABLET: 875-125 | 14 days supply | Qty: 14 | Fill #0

## 2019-02-08 NOTE — Progress Notes (Signed)

## 2019-02-27 ENCOUNTER — Other Ambulatory Visit: Payer: Self-pay | Admitting: Obstetrics & Gynecology

## 2019-02-27 MED FILL — TOPIRAMATE 50 MG TABLET: 50 | 30 days supply | Qty: 60 | Fill #1

## 2019-02-27 MED FILL — METOPROLOL TARTRATE 50 MG T: 50 | 30 days supply | Qty: 60 | Fill #2

## 2019-02-27 MED FILL — ACYCLOVIR 5% OINTMENT: 5 | 15 days supply | Qty: 30 | Fill #0

## 2019-03-01 ENCOUNTER — Encounter: Payer: Self-pay | Admitting: Family Medicine

## 2019-03-01 ENCOUNTER — Ambulatory Visit (INDEPENDENT_AMBULATORY_CARE_PROVIDER_SITE_OTHER): Payer: 59 | Admitting: Family Medicine

## 2019-03-01 ENCOUNTER — Other Ambulatory Visit: Payer: Self-pay

## 2019-03-01 ENCOUNTER — Ambulatory Visit: Payer: Self-pay

## 2019-03-01 DIAGNOSIS — M542 Cervicalgia: Secondary | ICD-10-CM

## 2019-03-01 MED ORDER — NABUMETONE 750 MG PO TABS: 750.0000 mg | ORAL_TABLET | Freq: Two times a day (BID) | ORAL | 6 refills | Status: DC | PRN

## 2019-03-01 MED ORDER — TIZANIDINE HCL 2 MG PO TABS: 2.0000 mg | ORAL_TABLET | Freq: Every evening | ORAL | 1 refills | Status: DC | PRN

## 2019-03-01 MED FILL — tiZANidine HCL 2 MG TABS: 2 | 30 days supply | Qty: 60 | Fill #0

## 2019-03-01 MED FILL — NABUMETONE 750 MG TABS: 750 | 30 days supply | Qty: 60 | Fill #0
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
## 2019-03-11 ENCOUNTER — Encounter: Payer: Self-pay | Admitting: Physical Therapy

## 2019-03-11 ENCOUNTER — Other Ambulatory Visit: Payer: Self-pay

## 2019-03-11 ENCOUNTER — Ambulatory Visit: Payer: 59 | Attending: Family Medicine | Admitting: Physical Therapy

## 2019-03-11 ENCOUNTER — Encounter: Payer: Self-pay | Admitting: Family Medicine

## 2019-03-11 ENCOUNTER — Ambulatory Visit (INDEPENDENT_AMBULATORY_CARE_PROVIDER_SITE_OTHER): Payer: 59 | Admitting: Family Medicine

## 2019-03-11 DIAGNOSIS — M62838 Other muscle spasm: Secondary | ICD-10-CM | POA: Insufficient documentation

## 2019-03-11 DIAGNOSIS — M25512 Pain in left shoulder: Secondary | ICD-10-CM | POA: Diagnosis not present

## 2019-03-11 DIAGNOSIS — M25612 Stiffness of left shoulder, not elsewhere classified: Secondary | ICD-10-CM | POA: Insufficient documentation

## 2019-03-11 DIAGNOSIS — M545 Low back pain, unspecified: Secondary | ICD-10-CM

## 2019-03-11 DIAGNOSIS — M542 Cervicalgia: Secondary | ICD-10-CM

## 2019-03-11 MED ORDER — HYDROCODONE-ACETAMINOPHEN 5-325 MG PO TABS: 1.0000 | ORAL_TABLET | Freq: Two times a day (BID) | ORAL | 0 refills | Status: DC | PRN

## 2019-03-11 MED FILL — HYDROCODON-APAP 5-325: 5-325 | 5 days supply | Qty: 20 | Fill #0

## 2019-03-11 NOTE — Patient Instructions (Signed)
Given decompression exercises
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
## 2019-03-12 ENCOUNTER — Ambulatory Visit: Payer: 59

## 2019-03-12 ENCOUNTER — Ambulatory Visit: Payer: Self-pay

## 2019-03-12 ENCOUNTER — Encounter: Payer: Self-pay | Admitting: Physical Therapy

## 2019-03-12 ENCOUNTER — Ambulatory Visit: Payer: 59 | Admitting: Physical Therapy

## 2019-03-12 DIAGNOSIS — M542 Cervicalgia: Secondary | ICD-10-CM | POA: Diagnosis not present

## 2019-03-12 DIAGNOSIS — M25512 Pain in left shoulder: Secondary | ICD-10-CM

## 2019-03-12 DIAGNOSIS — M62838 Other muscle spasm: Secondary | ICD-10-CM

## 2019-03-12 DIAGNOSIS — M722 Plantar fascial fibromatosis: Secondary | ICD-10-CM

## 2019-03-12 DIAGNOSIS — M25612 Stiffness of left shoulder, not elsewhere classified: Secondary | ICD-10-CM

## 2019-03-12 DIAGNOSIS — M79676 Pain in unspecified toe(s): Secondary | ICD-10-CM

## 2019-03-12 NOTE — Patient Instructions (Signed)
Cane Overhead - Supine  Hold cane at thighs with both hands, extend arms straight over head. Hold _10-15 __ seconds. Repeat __10_ times. Do _2__ times per day.   CHEST PRESS x 10   SIDE TO SIDE x 10   Try using a deflated ball under your neck but be sure you keep your head in neutral
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
## 2019-03-13 ENCOUNTER — Other Ambulatory Visit: Payer: 59

## 2019-03-13 DIAGNOSIS — G4733 Obstructive sleep apnea (adult) (pediatric): Secondary | ICD-10-CM | POA: Diagnosis not present

## 2019-03-14 ENCOUNTER — Encounter: Payer: Self-pay | Admitting: Family Medicine

## 2019-03-14 DIAGNOSIS — M542 Cervicalgia: Secondary | ICD-10-CM

## 2019-03-14 DIAGNOSIS — M545 Low back pain, unspecified: Secondary | ICD-10-CM

## 2019-03-17 ENCOUNTER — Ambulatory Visit: Payer: Self-pay

## 2019-03-19 ENCOUNTER — Other Ambulatory Visit: Payer: Self-pay

## 2019-03-19 ENCOUNTER — Ambulatory Visit: Payer: 59 | Admitting: Physical Therapy

## 2019-03-19 DIAGNOSIS — M542 Cervicalgia: Secondary | ICD-10-CM | POA: Diagnosis not present

## 2019-03-19 DIAGNOSIS — M62838 Other muscle spasm: Secondary | ICD-10-CM | POA: Diagnosis not present

## 2019-03-19 DIAGNOSIS — M25612 Stiffness of left shoulder, not elsewhere classified: Secondary | ICD-10-CM

## 2019-03-19 DIAGNOSIS — M25512 Pain in left shoulder: Secondary | ICD-10-CM

## 2019-03-20 NOTE — Therapy (Signed)
[place], [place], [address] Phone: [phone]   Fax:  [phone]  Physical Therapy Treatment  Patient Details  Name: Kelly [name] MRN: [id number] Date of Birth: 06-08-1966 Referring Provider (PT): Dr Eunice Blase    Encounter Date: 03/19/2019  PT End of Session - 03/20/19 1220    Visit Number  3    Number of Visits  12    Date for PT Re-Evaluation  04/22/19    Authorization Type  [facility]    PT Start Time  1602    PT Stop Time  1657    PT Time Calculation (min)  55 min    Activity Tolerance  Patient tolerated treatment well    Behavior During Therapy  [facility] for tasks assessed/performed       Past Medical History:  Diagnosis Date  . Anticardiolipin antibody positive   . HSV-2 (herpes simplex virus 2) infection   . Hyperlipidemia   . Hypertension    diet controlled  . Kidney stone   . Migraine   . Renal calculus, right   . Sleep apnea   . TMJ (dislocation of temporomandibular joint)     Past Surgical History:  Procedure Laterality Date  . CESAREAN SECTION     X3   LAST ONE 11-30-2004  . CYSTOSCOPY W/ RETROGRADES Right 10/29/2012   Procedure: CYSTOSCOPY WITH RETROGRADE PYELOGRAM and stent placement;  Surgeon: Reece Packer, MD;  Location: [place];  Service: Urology;  Laterality: Right;  rt stent placement , rt retrograde and cysto   . CYSTOSCOPY W/ URETERAL STENT PLACEMENT Right 11/23/2012   Procedure: CYSTOSCOPY WITH STENT REPLACEMENT;  Surgeon: Alexis Frock, MD;  Location: [facility];  Service: Urology;  Laterality: Right;  . CYSTOSCOPY/RETROGRADE/URETEROSCOPY/STONE EXTRACTION WITH BASKET Right 11/23/2012   Procedure: CYSTOSCOPY/RETROGRADE/URETEROSCOPY/STONE EXTRACTION WITH BASKET;  Surgeon: Alexis Frock, MD;  Location: [facility];  Service: Urology;  Laterality: Right;    There were no vitals filed for this  visit.                    [place] Adult PT Treatment/Exercise - 03/20/19 0001      Exercises   Exercises  Lumbar      Neck Exercises: Seated   Other Seated Exercise  seated scap retraction x20       Lumbar Exercises: Stretches   Lower Trunk Rotation Limitations  x10    Piriformis Stretch  3 reps;30 seconds      Lumbar Exercises: Supine   AB Set Limitations  reviewed abdominal breathing with posterior pelvic tilt       Moist Heat Therapy   Number Minutes Moist Heat  10 Minutes      Electrical Stimulation   Electrical Stimulation Location  cervical spine / right lumbar     Electrical Stimulation Action  pre mod     Electrical Stimulation Parameters  to tolerance     Electrical Stimulation Goals  Pain      Manual Therapy   Manual Therapy  Manual Traction    Manual therapy comments  PROM of the left shoulder     Soft tissue mobilization  to cerivical paraapinals, upper trap and peri-scapular area L> R;  To lumbar spine in sidelying;     Manual Traction  LAD 3x30 sec hold bilateral; cervical manual traction assessed vertebral artery first;              PT  Education - 03/20/19 1219    Education Details  Reviewed HEP and symptom mangement    Person(s) Educated  Patient    Methods  Explanation;Demonstration;Tactile cues;Verbal cues    Comprehension  Verbalized understanding;Returned demonstration;Verbal cues required;Tactile cues required       PT Short Term Goals - 03/20/19 1227      PT SHORT TERM GOAL #1   Title  Patient will be indepdent with relaxation and breathing techniques to reduce actue inflamtion    Time  3    Period  Weeks    Status  On-going      PT SHORT TERM GOAL #2   Title  Patient will report no radiating pain down her left arm    Time  3    Period  Weeks    Status  On-going      PT SHORT TERM GOAL #3   Title  Patient will depnistrate 130 degrees of active shoulder flexion    Time  3    Period  Weeks    Status  On-going         PT Long Term Goals - 03/12/19 6644      PT LONG TERM GOAL #1   Title  Patient will demonstrate 70 degrees of passive ROM bilateral without pain    Time  6    Period  Weeks    Status  New    Target Date  04/23/19      PT LONG TERM GOAL #2   Title  Patient will demonstrate full use of her left arm without increased pain    Time  6    Period  Weeks    Status  New    Target Date  04/23/19      PT LONG TERM GOAL #3   Title  Patient will ambulate without lower back pain in order to perfrom ADL's    Time  6    Period  Weeks    Status  New    Target Date  04/23/19            Plan - 03/20/19 1223    Clinical Impression Statement  Patient continues to have spamsing but her pain is more foacal aorund those muscle spasmins today. Therapy talked to her about fry needling. She will think about it. Therapy focused on manul therapy today but she was given stretches.    Personal Factors and Comorbidities  Comorbidity 1;Comorbidity 2;Profession    Comorbidities  history of cervical spine pain, TMJ    Examination-Activity Limitations  Lift;Reach Overhead;Locomotion Level    Examination-Participation Restrictions  Shop;Driving;Community Activity;Cleaning;Meal Prep    Stability/Clinical Decision Making  Evolving/Moderate complexity    Clinical Decision Making  Moderate    Rehab Potential  Good    PT Frequency  2x / week    PT Duration  6 weeks    PT Treatment/Interventions  ADLs/Self Care Home Management;Cryotherapy;Electrical Stimulation;Iontophoresis 4mg /ml Dexamethasone;Moist Heat;Ultrasound;Traction;DME Instruction;Gait training;Stair training;Functional mobility training;Therapeutic activities;Therapeutic exercise;Neuromuscular re-education;Patient/family education;Manual techniques;Passive range of motion;Dry needling;Taping    PT Next Visit Plan  revbiew HEP, review decomopression position, consider manuall therapy to neck and back, consider modalities to neck and back, work on  relaxation and breathing techniques, monitor symptoms as it is still very early. Consider PROM of the left shoulder; consider shpards hook    PT Home Exercise Plan  leglengthener, decompression with shoulder sink, cerivical rotation 3x in pain free range, AAROM cane; piriformis stretch, lower trunk rotation  Consulted and Agree with Plan of Care  Patient       Patient will benefit from skilled therapeutic intervention in order to improve the following deficits and impairments:     Visit Diagnosis: 1. Cervicalgia   2. Other muscle spasm   3. Acute pain of left shoulder   4. Stiffness of left shoulder, not elsewhere classified        Problem List Patient Active Problem List   Diagnosis Date Noted  . Overactive bladder 11/19/2018  . Photosensitization due to sun 11/02/2018  . Generalized pain 11/02/2018  . High risk medication use 11/02/2018  . Foot pain, bilateral 05/10/2018  . Heel pain, bilateral 05/10/2018  . Plantar fasciitis 05/10/2018  . Left knee pain 05/10/2018  . Patellofemoral pain syndrome of both knees 05/10/2018  . Generalized abdominal pain 04/17/2018  . Flank pain 04/17/2018  . Acute back pain 04/17/2018  . History of renal stone 04/17/2018  . Urinary frequency 04/17/2018  . Laceration of left hand 01/31/2018  . Decreased range of motion of finger of left hand 01/31/2018  . Localized swelling on left hand 01/31/2018  . Chronic migraine without aura without status migrainosus, not intractable 11/01/2016  . History of migraine 06/10/2016  . Diarrhea 05/20/2016  . Pain in the chest 05/11/2016  . Dizziness and giddiness 05/11/2016  . History of hypertension 05/11/2016  . Hemorrhoidal skin tag 04/07/2015  . IUD (intrauterine device) in place 05/03/2013  . Allergic rhinitis 01/11/2013  . Current smoker 01/11/2013  . Weight gain 06/26/2012  . Cervical stenosis (uterine cervix) 04/25/2012  . Anticardiolipin antibody positive   . Hyperlipidemia   .  Hypertension   . History of trichomoniasis 01/18/2011    Class: History of  . THYROID NODULE, RIGHT 02/05/2008  . GERD 02/05/2008  . NECK PAIN 02/05/2008    Carney Living PT DPT  03/20/2019, 12:34 PM  [facility] [address] [place], [place], [address] Phone: [phone]   Fax:  [phone]  Name: Kelly [name] MRN: [id number] Date of Birth: 01-Jul-1966

## 2019-03-20 NOTE — Patient Instructions (Signed)
Access Code: [email or web address]  URL: [email or web address]  Date: 03/20/2019  Prepared by: Carolyne Littles   Exercises  Supine Piriformis Stretch Pulling Heel to Hip - 10 reps - 3 sets - 2x daily - 7x weekly  Supine Lower Trunk Rotation - 10 reps - 3 sets - 2x daily - 7x weekly  Standing Glute Med Mobilization with Small Ball on Wall - 10 reps - 3 sets - 2x daily - 7x weekly

## 2019-03-21 ENCOUNTER — Ambulatory Visit: Payer: 59 | Admitting: Physical Therapy

## 2019-03-21 ENCOUNTER — Encounter: Payer: Self-pay | Admitting: Physical Therapy

## 2019-03-21 ENCOUNTER — Other Ambulatory Visit: Payer: Self-pay

## 2019-03-21 DIAGNOSIS — M25512 Pain in left shoulder: Secondary | ICD-10-CM

## 2019-03-21 DIAGNOSIS — M62838 Other muscle spasm: Secondary | ICD-10-CM | POA: Diagnosis not present

## 2019-03-21 DIAGNOSIS — M542 Cervicalgia: Secondary | ICD-10-CM

## 2019-03-21 DIAGNOSIS — M25612 Stiffness of left shoulder, not elsewhere classified: Secondary | ICD-10-CM | POA: Diagnosis not present

## 2019-03-22 ENCOUNTER — Encounter: Payer: Self-pay | Admitting: Physical Therapy

## 2019-03-22 NOTE — Therapy (Signed)
[place], [place], [address] Phone: [phone]   Fax:  [phone]  Physical Therapy Treatment  Patient Details  Name: [name] MRN: [id number] Date of Birth: 06-16-1966 Referring Provider (PT): Dr Eunice Blase    Encounter Date: 03/21/2019  PT End of Session - 03/21/19 1609    Visit Number  4    Number of Visits  12    Date for PT Re-Evaluation  04/22/19    Authorization Type  [facility] [employer]    PT Start Time  1600    PT Stop Time  0623    PT Time Calculation (min)  59 min    Activity Tolerance  Patient tolerated treatment well    Behavior During Therapy  [facility] for tasks assessed/performed       Past Medical History:  Diagnosis Date  . Anticardiolipin antibody positive   . HSV-2 (herpes simplex virus 2) infection   . Hyperlipidemia   . Hypertension    diet controlled  . Kidney stone   . Migraine   . Renal calculus, right   . Sleep apnea   . TMJ (dislocation of temporomandibular joint)     Past Surgical History:  Procedure Laterality Date  . CESAREAN SECTION     X3   LAST ONE 11-30-2004  . CYSTOSCOPY W/ RETROGRADES Right 10/29/2012   Procedure: CYSTOSCOPY WITH RETROGRADE PYELOGRAM and stent placement;  Surgeon: Reece Packer, MD;  Location: [place];  Service: Urology;  Laterality: Right;  rt stent placement , rt retrograde and cysto   . CYSTOSCOPY W/ URETERAL STENT PLACEMENT Right 11/23/2012   Procedure: CYSTOSCOPY WITH STENT REPLACEMENT;  Surgeon: Alexis Frock, MD;  Location: [facility];  Service: Urology;  Laterality: Right;  . CYSTOSCOPY/RETROGRADE/URETEROSCOPY/STONE EXTRACTION WITH BASKET Right 11/23/2012   Procedure: CYSTOSCOPY/RETROGRADE/URETEROSCOPY/STONE EXTRACTION WITH BASKET;  Surgeon: Alexis Frock, MD;  Location: [facility];  Service: Urology;  Laterality: Right;    There were no vitals filed for this visit.  Subjective  Assessment - 03/21/19 1606    Subjective  Patient feels like her right side of the back is still hurting. She feels like her shoulders are hurting her. She continues to have tigling pain from her neck down into her right shoulder.    Pertinent History  TMJ, migranes,    Limitations  Other (comment)    How long can you stand comfortably?  increased lower back when standing    Diagnostic tests  No x-rays in the chart.    Patient Stated Goals  to have less pain    Currently in Pain?  Yes    Pain Score  6     Pain Location  Neck    Pain Orientation  Right;Left    Pain Onset  More than a month ago    Pain Score  4    Pain Location  Back    Pain Orientation  Right    Pain Descriptors / Indicators  Aching    Pain Type  Acute pain    Pain Onset  More than a month ago    Pain Frequency  Constant                       [facility] Adult PT Treatment/Exercise - 03/22/19 0001      Neck Exercises: Supine   Other Supine Exercise  supine wand flexion 2x10       Lumbar Exercises: Supine  AB Set Limitations  reviewed abdominal breathing with posterior pelvic tilt     Pelvic Tilt Limitations  x10    Clam Limitations  x10 yellow       Moist Heat Therapy   Number Minutes Moist Heat  10 Minutes    Moist Heat Location  Cervical;Lumbar Spine      Electrical Stimulation   Electrical Stimulation Location  cervical spine / right lumbar     Electrical Stimulation Goals  Pain      Manual Therapy   Manual Therapy  Manual Traction    Manual therapy comments  PROM of the left shoulder     Soft tissue mobilization  to cerivical paraapinals, upper trap and peri-scapular area L> R;  To lumbar spine in sidelying;     Manual Traction  LAD 3x30 sec hold bilateral; cervical manual traction assessed vertebral artery first;                PT Short Term Goals - 03/20/19 1227      PT SHORT TERM GOAL #1   Title  Patient will be indepdent with relaxation and breathing techniques to reduce  actue inflamtion    Time  3    Period  Weeks    Status  On-going      PT SHORT TERM GOAL #2   Title  Patient will report no radiating pain down her left arm    Time  3    Period  Weeks    Status  On-going      PT SHORT TERM GOAL #3   Title  Patient will depnistrate 130 degrees of active shoulder flexion    Time  3    Period  Weeks    Status  On-going        PT Long Term Goals - 03/12/19 2947      PT LONG TERM GOAL #1   Title  Patient will demonstrate 70 degrees of passive ROM bilateral without pain    Time  6    Period  Weeks    Status  New    Target Date  04/23/19      PT LONG TERM GOAL #2   Title  Patient will demonstrate full use of her left arm without increased pain    Time  6    Period  Weeks    Status  New    Target Date  04/23/19      PT LONG TERM GOAL #3   Title  Patient will ambulate without lower back pain in order to perfrom ADL's    Time  6    Period  Weeks    Status  New    Target Date  04/23/19            Plan - 03/22/19 1106    Clinical Impression Statement  Spasming is reducing in her upper traps and back. She is still having pain which is frutrating her but her pain will likley start to reduce soon with the reduction of her spasms and her improved overall mobility. Therapy will continjue to progress exercises as tolerated.    Personal Factors and Comorbidities  Comorbidity 1;Comorbidity 2;Profession    Examination-Activity Limitations  Lift;Reach Overhead;Locomotion Level    Examination-Participation Restrictions  Shop;Driving;Community Activity;Cleaning;Meal Prep    Stability/Clinical Decision Making  Evolving/Moderate complexity    Clinical Decision Making  Moderate    Rehab Potential  Good    PT Frequency  2x / week  PT Duration  6 weeks    PT Treatment/Interventions  ADLs/Self Care Home Management;Cryotherapy;Electrical Stimulation;Iontophoresis 4mg /ml Dexamethasone;Moist Heat;Ultrasound;Traction;DME Instruction;Gait training;Stair  training;Functional mobility training;Therapeutic activities;Therapeutic exercise;Neuromuscular re-education;Patient/family education;Manual techniques;Passive range of motion;Dry needling;Taping    PT Next Visit Plan  revbiew HEP, review decomopression position, consider manuall therapy to neck and back, consider modalities to neck and back, work on relaxation and breathing techniques, monitor symptoms as it is still very early. Consider PROM of the left shoulder; consider shpards hook    PT Home Exercise Plan  leglengthener, decompression with shoulder sink, cerivical rotation 3x in pain free range, AAROM cane; piriformis stretch, lower trunk rotation    Consulted and Agree with Plan of Care  Patient       Patient will benefit from skilled therapeutic intervention in order to improve the following deficits and impairments:     Visit Diagnosis: 1. Cervicalgia   2. Other muscle spasm   3. Acute pain of left shoulder   4. Stiffness of left shoulder, not elsewhere classified        Problem List Patient Active Problem List   Diagnosis Date Noted  . Overactive bladder 11/19/2018  . Photosensitization due to sun 11/02/2018  . Generalized pain 11/02/2018  . High risk medication use 11/02/2018  . Foot pain, bilateral 05/10/2018  . Heel pain, bilateral 05/10/2018  . Plantar fasciitis 05/10/2018  . Left knee pain 05/10/2018  . Patellofemoral pain syndrome of both knees 05/10/2018  . Generalized abdominal pain 04/17/2018  . Flank pain 04/17/2018  . Acute back pain 04/17/2018  . History of renal stone 04/17/2018  . Urinary frequency 04/17/2018  . Laceration of left hand 01/31/2018  . Decreased range of motion of finger of left hand 01/31/2018  . Localized swelling on left hand 01/31/2018  . Chronic migraine without aura without status migrainosus, not intractable 11/01/2016  . History of migraine 06/10/2016  . Diarrhea 05/20/2016  . Pain in the chest 05/11/2016  . Dizziness and  giddiness 05/11/2016  . History of hypertension 05/11/2016  . Hemorrhoidal skin tag 04/07/2015  . IUD (intrauterine device) in place 05/03/2013  . Allergic rhinitis 01/11/2013  . Current smoker 01/11/2013  . Weight gain 06/26/2012  . Cervical stenosis (uterine cervix) 04/25/2012  . Anticardiolipin antibody positive   . Hyperlipidemia   . Hypertension   . History of trichomoniasis 01/18/2011    Class: History of  . THYROID NODULE, RIGHT 02/05/2008  . GERD 02/05/2008  . NECK PAIN 02/05/2008    Carney Living PT DPT  03/22/2019, 11:11 AM  [facility] [address] [place], [place], [address] Phone: [phone]   Fax:  [phone]  Name: Kelly [name] MRN: [id number] Date of Birth: 08/07/1966

## 2019-03-26 ENCOUNTER — Other Ambulatory Visit: Payer: Self-pay

## 2019-03-26 ENCOUNTER — Ambulatory Visit: Payer: 59

## 2019-03-26 ENCOUNTER — Encounter: Payer: Self-pay | Admitting: Physical Therapy

## 2019-03-26 ENCOUNTER — Ambulatory Visit: Payer: 59 | Attending: Family Medicine | Admitting: Physical Therapy

## 2019-03-26 DIAGNOSIS — R2689 Other abnormalities of gait and mobility: Secondary | ICD-10-CM | POA: Diagnosis not present

## 2019-03-26 DIAGNOSIS — M25612 Stiffness of left shoulder, not elsewhere classified: Secondary | ICD-10-CM | POA: Diagnosis not present

## 2019-03-26 DIAGNOSIS — M542 Cervicalgia: Secondary | ICD-10-CM | POA: Insufficient documentation

## 2019-03-26 DIAGNOSIS — M62838 Other muscle spasm: Secondary | ICD-10-CM | POA: Insufficient documentation

## 2019-03-26 DIAGNOSIS — G8929 Other chronic pain: Secondary | ICD-10-CM | POA: Insufficient documentation

## 2019-03-26 DIAGNOSIS — M25562 Pain in left knee: Secondary | ICD-10-CM | POA: Diagnosis not present

## 2019-03-26 DIAGNOSIS — M25512 Pain in left shoulder: Secondary | ICD-10-CM | POA: Diagnosis not present

## 2019-03-26 DIAGNOSIS — M722 Plantar fascial fibromatosis: Secondary | ICD-10-CM

## 2019-03-27 ENCOUNTER — Other Ambulatory Visit: Payer: Self-pay | Admitting: *Deleted

## 2019-03-27 ENCOUNTER — Encounter: Payer: Self-pay | Admitting: Physical Therapy

## 2019-03-27 DIAGNOSIS — H5203 Hypermetropia, bilateral: Secondary | ICD-10-CM | POA: Diagnosis not present

## 2019-03-27 DIAGNOSIS — H52223 Regular astigmatism, bilateral: Secondary | ICD-10-CM | POA: Diagnosis not present

## 2019-03-27 DIAGNOSIS — H524 Presbyopia: Secondary | ICD-10-CM | POA: Diagnosis not present

## 2019-03-27 MED ORDER — ACYCLOVIR 5 % EX OINT: 1.0000 "application " | TOPICAL_OINTMENT | CUTANEOUS | 3 refills | Status: DC

## 2019-03-27 MED FILL — ACYCLOVIR 5% OINTMENT: 5 | 30 days supply | Qty: 30 | Fill #0

## 2019-03-27 NOTE — Therapy (Signed)
[place], [name], [address] Phone: [phone]   Fax:  [phone]  Physical Therapy Treatment  Patient Details  Name: [name] MRN: [id number] Date of Birth: 1965/09/03 Referring Provider (PT): Dr Eunice Blase    Encounter Date: 03/26/2019  PT End of Session - 03/26/19 1636    Visit Number  5    Number of Visits  12    Date for PT Re-Evaluation  04/22/19    Authorization Type  [facility]    PT Start Time  1630    PT Stop Time  1728    PT Time Calculation (min)  58 min    Activity Tolerance  Patient tolerated treatment well    Behavior During Therapy  [facility] for tasks assessed/performed       Past Medical History:  Diagnosis Date  . Anticardiolipin antibody positive   . HSV-2 (herpes simplex virus 2) infection   . Hyperlipidemia   . Hypertension    diet controlled  . Kidney stone   . Migraine   . Renal calculus, right   . Sleep apnea   . TMJ (dislocation of temporomandibular joint)     Past Surgical History:  Procedure Laterality Date  . CESAREAN SECTION     X3   LAST ONE 11-30-2004  . CYSTOSCOPY W/ RETROGRADES Right 10/29/2012   Procedure: CYSTOSCOPY WITH RETROGRADE PYELOGRAM and stent placement;  Surgeon: Reece Packer, MD;  Location: [place];  Service: Urology;  Laterality: Right;  rt stent placement , rt retrograde and cysto   . CYSTOSCOPY W/ URETERAL STENT PLACEMENT Right 11/23/2012   Procedure: CYSTOSCOPY WITH STENT REPLACEMENT;  Surgeon: Alexis Frock, MD;  Location: [facility];  Service: Urology;  Laterality: Right;  . CYSTOSCOPY/RETROGRADE/URETEROSCOPY/STONE EXTRACTION WITH BASKET Right 11/23/2012   Procedure: CYSTOSCOPY/RETROGRADE/URETEROSCOPY/STONE EXTRACTION WITH BASKET;  Surgeon: Alexis Frock, MD;  Location: [facility];  Service: Urology;  Laterality: Right;    There were no vitals filed for this visit.  Subjective  Assessment - 03/27/19 0902    Subjective  [name] reports her back is feeling better. She continues to have pain inher neck. She had a good trip. She was really busy on Monday atwork and sitting at a different desk. She reports her neck became very stiff and painful.    Pertinent History  TMJ, migranes,    Limitations  Other (comment)    How long can you stand comfortably?  increased lower back when standing    How long can you walk comfortably?  increased pain with ambualtion    Diagnostic tests  No x-rays in the chart.    Patient Stated Goals  to have less pain    Currently in Pain?  Yes    Pain Score  4     Pain Location  Neck    Pain Orientation  Right;Left    Pain Descriptors / Indicators  Aching    Pain Type  Chronic pain    Pain Onset  More than a month ago    Pain Frequency  Constant    Aggravating Factors   moving her head    Pain Relieving Factors  rest    Effect of Pain on Daily Activities  difficulty perfroming ADL'S    Multiple Pain Sites  --   very little low back pain today  [facility] Adult PT Treatment/Exercise - 03/27/19 0001      Lumbar Exercises: Standing   Other Standing Lumbar Exercises  scpa retraction 2x10 yellow; shoulder extension 2x10 yellow       Moist Heat Therapy   Moist Heat Location  Cervical;Lumbar Spine      Electrical Stimulation   Electrical Stimulation Location  cervical spine / right lumbar     Electrical Stimulation Action  pre mod     Electrical Stimulation Parameters  to tolerance     Electrical Stimulation Goals  Pain      Manual Therapy   Manual Therapy  Manual Traction    Manual therapy comments  PROM of the left shoulder; skilled palapation of trigger points    Soft tissue mobilization  to cerivical paraapinals, upper trap and peri-scapular area L> R;  To lumbar spine in sidelying;     Manual Traction  LAD 3x30 sec hold bilateral; cervical manual traction assessed vertebral artery first;       Neck  Exercises: Stretches   Upper Trapezius Stretch  3 reps;20 seconds    Levator Stretch  3 reps;20 seconds       Trigger Point Dry Needling - 03/27/19 0001    Consent Given?  Yes    Education Handout Provided  Yes    Muscles Treated Head and Neck  Upper trapezius;Cervical multifidi    Dry Needling Comments  2 areas of left upper trap using 25x30 and 1 spot in cerivcal left multifidis 25x30     Upper Trapezius Response  Twitch reponse elicited    Cervical multifidi Response  Twitch reponse elicited           PT Education - 03/26/19 1635    Education Details  reviewed HEP and symptom    Person(s) Educated  Patient    Methods  Explanation;Demonstration;Tactile cues;Verbal cues    Comprehension  Verbalized understanding;Returned demonstration;Verbal cues required;Tactile cues required       PT Short Term Goals - 03/27/19 0925      PT SHORT TERM GOAL #1   Title  Patient will be indepdent with relaxation and breathing techniques to reduce actue inflamtion    Time  3    Period  Weeks    Status  On-going    Target Date  04/02/19      PT SHORT TERM GOAL #2   Title  Patient will report no radiating pain down her left arm    Time  3    Period  Weeks    Status  On-going    Target Date  07/10/18      PT SHORT TERM GOAL #3   Title  Patient will depnistrate 130 degrees of active shoulder flexion    Time  3    Period  Weeks    Status  On-going    Target Date  04/02/19        PT Long Term Goals - 03/12/19 0822      PT LONG TERM GOAL #1   Title  Patient will demonstrate 70 degrees of passive ROM bilateral without pain    Time  6    Period  Weeks    Status  New    Target Date  04/23/19      PT LONG TERM GOAL #2   Title  Patient will demonstrate full use of her left arm without increased pain    Time  6    Period  Weeks    Status  New    Target Date  04/23/19      PT LONG TERM GOAL #3   Title  Patient will ambulate without lower back pain in order to perfrom ADL's     Time  6    Period  Weeks    Status  New    Target Date  04/23/19            Plan - 03/26/19 1720    Clinical Impression Statement  Patient tolerated trigger point dry needling well. She had a good twitch respose to all needling. She had a great twitch response to needling to her upper trap. She was also given light scpaular exercises to reduce post needle soreness. The patients trigger points are getting smaller but she is still having pain in different areas. She was given scapularstrengthening to reduce post needle soreness.    Personal Factors and Comorbidities  Comorbidity 1;Comorbidity 2;Profession    Comorbidities  history of cervical spine pain, TMJ    Examination-Activity Limitations  Lift;Reach Overhead;Locomotion Level    Examination-Participation Restrictions  Shop;Driving;Community Activity;Cleaning;Meal Prep    Stability/Clinical Decision Making  Evolving/Moderate complexity    Rehab Potential  Good    PT Frequency  2x / week    PT Duration  6 weeks    PT Treatment/Interventions  ADLs/Self Care Home Management;Cryotherapy;Electrical Stimulation;Iontophoresis 4mg /ml Dexamethasone;Moist Heat;Ultrasound;Traction;DME Instruction;Gait training;Stair training;Functional mobility training;Therapeutic activities;Therapeutic exercise;Neuromuscular re-education;Patient/family education;Manual techniques;Passive range of motion;Dry needling;Taping    PT Next Visit Plan  revbiew HEP, review decomopression position, consider manuall therapy to neck and back, consider modalities to neck and back, work on relaxation and breathing techniques, monitor symptoms as it is still very early. Consider PROM of the left shoulder; consider shpards hook    PT Home Exercise Plan  leglengthener, decompression with shoulder sink, cerivical rotation 3x in pain free range, AAROM cane; piriformis stretch, lower trunk rotation    Consulted and Agree with Plan of Care  Patient       Patient will benefit from  skilled therapeutic intervention in order to improve the following deficits and impairments:     Visit Diagnosis: 1. Other muscle spasm   2. Cervicalgia   3. Acute pain of left shoulder   4. Stiffness of left shoulder, not elsewhere classified   5. Chronic pain of left knee        Problem List Patient Active Problem List   Diagnosis Date Noted  . Overactive bladder 11/19/2018  . Photosensitization due to sun 11/02/2018  . Generalized pain 11/02/2018  . High risk medication use 11/02/2018  . Foot pain, bilateral 05/10/2018  . Heel pain, bilateral 05/10/2018  . Plantar fasciitis 05/10/2018  . Left knee pain 05/10/2018  . Patellofemoral pain syndrome of both knees 05/10/2018  . Generalized abdominal pain 04/17/2018  . Flank pain 04/17/2018  . Acute back pain 04/17/2018  . History of renal stone 04/17/2018  . Urinary frequency 04/17/2018  . Laceration of left hand 01/31/2018  . Decreased range of motion of finger of left hand 01/31/2018  . Localized swelling on left hand 01/31/2018  . Chronic migraine without aura without status migrainosus, not intractable 11/01/2016  . History of migraine 06/10/2016  . Diarrhea 05/20/2016  . Pain in the chest 05/11/2016  . Dizziness and giddiness 05/11/2016  . History of hypertension 05/11/2016  . Hemorrhoidal skin tag 04/07/2015  . IUD (intrauterine device) in place 05/03/2013  . Allergic rhinitis 01/11/2013  . Current smoker 01/11/2013  . Weight gain 06/26/2012  .  Cervical stenosis (uterine cervix) 04/25/2012  . Anticardiolipin antibody positive   . Hyperlipidemia   . Hypertension   . History of trichomoniasis 01/18/2011    Class: History of  . THYROID NODULE, RIGHT 02/05/2008  . GERD 02/05/2008  . NECK PAIN 02/05/2008    Carney Living PT DPT  03/27/2019, 9:31 AM  [facility] [address] [place], [name], [address] Phone: [phone]   Fax:  [phone]  Name: [name] MRN: [id number] Date of Birth: [birth year]

## 2019-03-28 ENCOUNTER — Other Ambulatory Visit: Payer: Self-pay

## 2019-03-28 ENCOUNTER — Encounter: Payer: Self-pay | Admitting: Physical Therapy

## 2019-03-28 ENCOUNTER — Ambulatory Visit: Payer: 59 | Admitting: Physical Therapy

## 2019-03-28 DIAGNOSIS — M25512 Pain in left shoulder: Secondary | ICD-10-CM | POA: Diagnosis not present

## 2019-03-28 DIAGNOSIS — M62838 Other muscle spasm: Secondary | ICD-10-CM | POA: Diagnosis not present

## 2019-03-28 DIAGNOSIS — M542 Cervicalgia: Secondary | ICD-10-CM | POA: Diagnosis not present

## 2019-03-28 DIAGNOSIS — G8929 Other chronic pain: Secondary | ICD-10-CM | POA: Diagnosis not present

## 2019-03-28 DIAGNOSIS — M25612 Stiffness of left shoulder, not elsewhere classified: Secondary | ICD-10-CM | POA: Diagnosis not present

## 2019-03-28 DIAGNOSIS — M25562 Pain in left knee: Secondary | ICD-10-CM | POA: Diagnosis not present

## 2019-03-28 DIAGNOSIS — R2689 Other abnormalities of gait and mobility: Secondary | ICD-10-CM | POA: Diagnosis not present

## 2019-03-29 ENCOUNTER — Encounter: Payer: Self-pay | Admitting: Physical Therapy

## 2019-03-29 ENCOUNTER — Encounter: Payer: Self-pay | Admitting: Family Medicine

## 2019-03-29 NOTE — Therapy (Signed)
[place], [place], [address] Phone: [phone]   Fax:  [phone]  Physical Therapy Treatment  Patient Details  Name: Kelly [name] MRN: [id number] Date of Birth: 30-Jun-1966 Referring Provider (PT): Dr Eunice Blase    Encounter Date: 03/28/2019  PT End of Session - 03/29/19 1113    Visit Number  6    Number of Visits  12    Date for PT Re-Evaluation  04/22/19    Authorization Type  [facility] [employer]    PT Start Time  1630    PT Stop Time  1725    PT Time Calculation (min)  55 min    Activity Tolerance  Patient tolerated treatment well    Behavior During Therapy  [facility] for tasks assessed/performed       Past Medical History:  Diagnosis Date  . Anticardiolipin antibody positive   . HSV-2 (herpes simplex virus 2) infection   . Hyperlipidemia   . Hypertension    diet controlled  . Kidney stone   . Migraine   . Renal calculus, right   . Sleep apnea   . TMJ (dislocation of temporomandibular joint)     Past Surgical History:  Procedure Laterality Date  . CESAREAN SECTION     X3   LAST ONE 11-30-2004  . CYSTOSCOPY W/ RETROGRADES Right 10/29/2012   Procedure: CYSTOSCOPY WITH RETROGRADE PYELOGRAM and stent placement;  Surgeon: Reece Packer, MD;  Location: [place];  Service: Urology;  Laterality: Right;  rt stent placement , rt retrograde and cysto   . CYSTOSCOPY W/ URETERAL STENT PLACEMENT Right 11/23/2012   Procedure: CYSTOSCOPY WITH STENT REPLACEMENT;  Surgeon: Alexis Frock, MD;  Location: [facility];  Service: Urology;  Laterality: Right;  . CYSTOSCOPY/RETROGRADE/URETEROSCOPY/STONE EXTRACTION WITH BASKET Right 11/23/2012   Procedure: CYSTOSCOPY/RETROGRADE/URETEROSCOPY/STONE EXTRACTION WITH BASKET;  Surgeon: Alexis Frock, MD;  Location: [facility];  Service: Urology;  Laterality: Right;    There were no vitals filed for this visit.  Subjective  Assessment - 03/28/19 1637    Subjective  Patient continues to have pain in her spine. She reports the pain is more in her bones at this time. She is having less pain in her back.    Pertinent History  TMJ, migranes,    Limitations  Other (comment)    How long can you stand comfortably?  increased lower back when standing    How long can you walk comfortably?  increased pain with ambualtion    Diagnostic tests  No x-rays in the chart.    Patient Stated Goals  to have less pain    Currently in Pain?  No/denies    Pain Score  6     Pain Location  Neck    Pain Orientation  Right;Left    Pain Type  Chronic pain    Pain Onset  More than a month ago    Pain Frequency  Constant    Aggravating Factors   moveing the head    Pain Relieving Factors  rest    Effect of Pain on Daily Activities  difficulty perfroming ADL's    Pain Score  3    Pain Location  Back    Pain Orientation  Right    Pain Onset  More than a month ago    Pain Frequency  Constant    Aggravating Factors   use of the arm    Pain Relieving Factors  rest  Effect of Pain on Daily Activities  difficulty using her arm                       [facility] Adult PT Treatment/Exercise - 03/29/19 0001      Lumbar Exercises: Standing   Other Standing Lumbar Exercises  scpa retraction 2x10 yellow; shoulder extension 2x10 yellow     Other Standing Lumbar Exercises  given red band for home ; reviewed cervical ROM in pain free ranges      Manual Therapy   Manual Therapy  Manual Traction    Manual therapy comments  PROM of the left shoulder; skilled palapation of trigger points    Soft tissue mobilization  to cerivical paraapinals, upper trap and peri-scapular area L> R using IASTYM and trigger point release      Manual Traction  LAD 3x30 sec hold bilateral; cervical manual traction assessed vertebral artery first;       Neck Exercises: Stretches   Upper Trapezius Stretch  3 reps;20 seconds    Levator Stretch  3 reps;20  seconds             PT Education - 03/29/19 1113    Education Details  HEP, symptom management    Person(s) Educated  Patient    Methods  Explanation;Demonstration;Tactile cues;Verbal cues    Comprehension  Verbalized understanding;Returned demonstration;Verbal cues required;Tactile cues required       PT Short Term Goals - 03/27/19 0925      PT SHORT TERM GOAL #1   Title  Patient will be indepdent with relaxation and breathing techniques to reduce actue inflamtion    Time  3    Period  Weeks    Status  On-going    Target Date  04/02/19      PT SHORT TERM GOAL #2   Title  Patient will report no radiating pain down her left arm    Time  3    Period  Weeks    Status  On-going    Target Date  07/10/18      PT SHORT TERM GOAL #3   Title  Patient will depnistrate 130 degrees of active shoulder flexion    Time  3    Period  Weeks    Status  On-going    Target Date  04/02/19        PT Long Term Goals - 03/12/19 0822      PT LONG TERM GOAL #1   Title  Patient will demonstrate 70 degrees of passive ROM bilateral without pain    Time  6    Period  Weeks    Status  New    Target Date  04/23/19      PT LONG TERM GOAL #2   Title  Patient will demonstrate full use of her left arm without increased pain    Time  6    Period  Weeks    Status  New    Target Date  04/23/19      PT LONG TERM GOAL #3   Title  Patient will ambulate without lower back pain in order to perfrom ADL's    Time  6    Period  Weeks    Status  New    Target Date  04/23/19            Plan - 03/29/19 1114    Clinical Impression Statement  Patient continues to have significant pain in her neck. She  has not been sleeping at night. She is not using her muscle relaxers. She reports they do help. Her spasming appears to be resolving. Hopefully that ewill begin to coorilate with improved pain. Therapy reviewed her stretches. She reports she will be consitent with them. Her movement is improving  as well. She continues to have decreased use of her left arm. Her back is improving. Therapy continue to focus on her neck but her shoulder and back need to be adressed at some point.    Personal Factors and Comorbidities  Comorbidity 1;Comorbidity 2;Profession    Comorbidities  history of cervical spine pain, TMJ    Examination-Activity Limitations  Lift;Reach Overhead;Locomotion Level    Stability/Clinical Decision Making  Evolving/Moderate complexity    Clinical Decision Making  Moderate    Rehab Potential  Good    PT Frequency  2x / week    PT Duration  6 weeks    PT Treatment/Interventions  ADLs/Self Care Home Management;Cryotherapy;Electrical Stimulation;Iontophoresis 4mg /ml Dexamethasone;Moist Heat;Ultrasound;Traction;DME Instruction;Gait training;Stair training;Functional mobility training;Therapeutic activities;Therapeutic exercise;Neuromuscular re-education;Patient/family education;Manual techniques;Passive range of motion;Dry needling;Taping    PT Next Visit Plan  revbiew HEP, review decomopression position, consider manuall therapy to neck and back, consider modalities to neck and back, work on relaxation and breathing techniques, monitor symptoms as it is still very early. Consider PROM of the left shoulder; consider shpards hook    PT Home Exercise Plan  leglengthener, decompression with shoulder sink, cerivical rotation 3x in pain free range, AAROM cane; piriformis stretch, lower trunk rotation    Consulted and Agree with Plan of Care  Patient       Patient will benefit from skilled therapeutic intervention in order to improve the following deficits and impairments:     Visit Diagnosis: 1. Other muscle spasm   2. Cervicalgia   3. Acute pain of left shoulder   4. Stiffness of left shoulder, not elsewhere classified        Problem List Patient Active Problem List   Diagnosis Date Noted  . Overactive bladder 11/19/2018  . Photosensitization due to sun 11/02/2018  .  Generalized pain 11/02/2018  . High risk medication use 11/02/2018  . Foot pain, bilateral 05/10/2018  . Heel pain, bilateral 05/10/2018  . Plantar fasciitis 05/10/2018  . Left knee pain 05/10/2018  . Patellofemoral pain syndrome of both knees 05/10/2018  . Generalized abdominal pain 04/17/2018  . Flank pain 04/17/2018  . Acute back pain 04/17/2018  . History of renal stone 04/17/2018  . Urinary frequency 04/17/2018  . Laceration of left hand 01/31/2018  . Decreased range of motion of finger of left hand 01/31/2018  . Localized swelling on left hand 01/31/2018  . Chronic migraine without aura without status migrainosus, not intractable 11/01/2016  . History of migraine 06/10/2016  . Diarrhea 05/20/2016  . Pain in the chest 05/11/2016  . Dizziness and giddiness 05/11/2016  . History of hypertension 05/11/2016  . Hemorrhoidal skin tag 04/07/2015  . IUD (intrauterine device) in place 05/03/2013  . Allergic rhinitis 01/11/2013  . Current smoker 01/11/2013  . Weight gain 06/26/2012  . Cervical stenosis (uterine cervix) 04/25/2012  . Anticardiolipin antibody positive   . Hyperlipidemia   . Hypertension   . History of trichomoniasis 01/18/2011    Class: History of  . THYROID NODULE, RIGHT 02/05/2008  . GERD 02/05/2008  . NECK PAIN 02/05/2008    Carney Living  PT DPT  03/29/2019, 11:21 AM  [facility]  [place], [place], [address] Phone: [phone]   Fax:  [phone]  Name: Kelly [name] MRN: [id number] Date of Birth: [birth year]

## 2019-04-01 ENCOUNTER — Other Ambulatory Visit: Payer: Self-pay | Admitting: Family Medicine

## 2019-04-01 ENCOUNTER — Other Ambulatory Visit: Payer: Self-pay | Admitting: Radiology

## 2019-04-01 DIAGNOSIS — M542 Cervicalgia: Secondary | ICD-10-CM

## 2019-04-01 DIAGNOSIS — M545 Low back pain, unspecified: Secondary | ICD-10-CM

## 2019-04-02 ENCOUNTER — Telehealth: Payer: Self-pay | Admitting: Family Medicine

## 2019-04-02 ENCOUNTER — Encounter: Payer: Self-pay | Admitting: Physical Therapy

## 2019-04-02 ENCOUNTER — Ambulatory Visit: Payer: 59 | Admitting: Physical Therapy

## 2019-04-02 ENCOUNTER — Other Ambulatory Visit: Payer: Self-pay | Admitting: Medical

## 2019-04-02 ENCOUNTER — Other Ambulatory Visit: Payer: Self-pay

## 2019-04-02 DIAGNOSIS — M25612 Stiffness of left shoulder, not elsewhere classified: Secondary | ICD-10-CM

## 2019-04-02 DIAGNOSIS — M25512 Pain in left shoulder: Secondary | ICD-10-CM | POA: Diagnosis not present

## 2019-04-02 DIAGNOSIS — M25562 Pain in left knee: Secondary | ICD-10-CM | POA: Diagnosis not present

## 2019-04-02 DIAGNOSIS — M542 Cervicalgia: Secondary | ICD-10-CM | POA: Diagnosis not present

## 2019-04-02 DIAGNOSIS — M62838 Other muscle spasm: Secondary | ICD-10-CM

## 2019-04-02 DIAGNOSIS — R2689 Other abnormalities of gait and mobility: Secondary | ICD-10-CM | POA: Diagnosis not present

## 2019-04-02 DIAGNOSIS — G8929 Other chronic pain: Secondary | ICD-10-CM | POA: Diagnosis not present

## 2019-04-02 MED FILL — METOPROLOL TARTRATE 50 MG T: 50 | 30 days supply | Qty: 60 | Fill #0

## 2019-04-02 NOTE — Therapy (Signed)
[place], [place], [address] Phone: [phone]   Fax:  [phone]  Physical Therapy Treatment  Patient Details  Name: Kelly [name] MRN: [id number] Date of Birth: 06/25/1966 Referring Provider (PT): Dr Eunice Blase    Encounter Date: 04/02/2019  PT End of Session - 04/02/19 1707    Visit Number  7    Number of Visits  12    Date for PT Re-Evaluation  04/22/19    Authorization Type  [facility]    PT Start Time  1632    PT Stop Time  1730    PT Time Calculation (min)  58 min    Activity Tolerance  Patient tolerated treatment well    Behavior During Therapy  [facility] for tasks assessed/performed       Past Medical History:  Diagnosis Date  . Anticardiolipin antibody positive   . HSV-2 (herpes simplex virus 2) infection   . Hyperlipidemia   . Hypertension    diet controlled  . Kidney stone   . Migraine   . Renal calculus, right   . Sleep apnea   . TMJ (dislocation of temporomandibular joint)     Past Surgical History:  Procedure Laterality Date  . CESAREAN SECTION     X3   LAST ONE 11-30-2004  . CYSTOSCOPY W/ RETROGRADES Right 10/29/2012   Procedure: CYSTOSCOPY WITH RETROGRADE PYELOGRAM and stent placement;  Surgeon: Reece Packer, MD;  Location: [place];  Service: Urology;  Laterality: Right;  rt stent placement , rt retrograde and cysto   . CYSTOSCOPY W/ URETERAL STENT PLACEMENT Right 11/23/2012   Procedure: CYSTOSCOPY WITH STENT REPLACEMENT;  Surgeon: Alexis Frock, MD;  Location: [facility];  Service: Urology;  Laterality: Right;  . CYSTOSCOPY/RETROGRADE/URETEROSCOPY/STONE EXTRACTION WITH BASKET Right 11/23/2012   Procedure: CYSTOSCOPY/RETROGRADE/URETEROSCOPY/STONE EXTRACTION WITH BASKET;  Surgeon: Alexis Frock, MD;  Location: [facility];  Service: Urology;  Laterality: Right;    There were no vitals filed for this visit.  Subjective  Assessment - 04/02/19 1703    Subjective  Patient reports the neck has been as good as it has been. She has been taking her muscle relasxers at night which has helped. She has also been doing her stretches.    Pertinent History  TMJ, migranes,    Limitations  Other (comment)    How long can you stand comfortably?  increased lower back when standing    How long can you walk comfortably?  increased pain with ambualtion    Diagnostic tests  No x-rays in the chart.    Patient Stated Goals  to have less pain    Currently in Pain?  Yes    Pain Score  4     Pain Location  Neck    Pain Orientation  Left    Pain Descriptors / Indicators  Aching    Pain Type  Chronic pain    Pain Onset  More than a month ago    Pain Frequency  Constant    Aggravating Factors   movement of the head    Pain Relieving Factors  rest    Effect of Pain on Daily Activities  difficulty perfroming ADL's    Multiple Pain Sites  No    Pain Score  3    Pain Location  Back    Pain Orientation  Right    Pain Descriptors / Indicators  Aching    Pain Type  Chronic  pain    Pain Onset  More than a month ago    Pain Frequency  Constant    Aggravating Factors   banding    Pain Relieving Factors  rest    Effect of Pain on Daily Activities  difficulty using her arms                       [facility] Adult PT Treatment/Exercise - 04/02/19 0001      Lumbar Exercises: Standing   Other Standing Lumbar Exercises  scpa retraction 2x10 red; shoulder extension 2x10 red    Other Standing Lumbar Exercises  trigger point release with ball shown low back and neck and shoulders       Lumbar Exercises: Supine   Bent Knee Raise Limitations  2x10    Bridge Limitations  2x10      Manual Therapy   Manual Therapy  Manual Traction    Manual therapy comments  PROM of the left shoulder; skilled palapation of trigger points    Soft tissue mobilization  to cerivical paraapinals, upper trap and peri-scapular area L> R using IASTYM and  trigger point release ; to lumbar spine     Manual Traction  LAD 3x30 sec hold bilateral; cervical manual traction assessed vertebral artery first; Grade 5 hip inferior glide       Neck Exercises: Stretches   Upper Trapezius Stretch  3 reps;20 seconds    Levator Stretch  3 reps;20 seconds             PT Education - 04/02/19 1706    Education Details  updated lumbar HEP    Person(s) Educated  Patient    Methods  Explanation;Demonstration;Verbal cues;Tactile cues    Comprehension  Verbalized understanding;Returned demonstration;Tactile cues required;Verbal cues required       PT Short Term Goals - 04/02/19 1728      PT SHORT TERM GOAL #1   Title  Patient will be indepdent with relaxation and breathing techniques to reduce actue inflamtion    Time  3    Period  Weeks    Status  On-going      PT SHORT TERM GOAL #2   Title  Patient will report no radiating pain down her left arm    Time  3    Period  Weeks    Status  On-going      PT SHORT TERM GOAL #3   Title  Patient will depnistrate 130 degrees of active shoulder flexion    Time  3    Period  Weeks    Status  On-going        PT Long Term Goals - 03/12/19 1448      PT LONG TERM GOAL #1   Title  Patient will demonstrate 70 degrees of passive ROM bilateral without pain    Time  6    Period  Weeks    Status  New    Target Date  04/23/19      PT LONG TERM GOAL #2   Title  Patient will demonstrate full use of her left arm without increased pain    Time  6    Period  Weeks    Status  New    Target Date  04/23/19      PT LONG TERM GOAL #3   Title  Patient will ambulate without lower back pain in order to perfrom ADL's    Time  6    Period  Weeks    Status  New    Target Date  04/23/19            Plan - 04/02/19 1720    Clinical Impression Statement  Patient was given exercises for heer core today. She had no increase in pain. Patient was also given a tennis ball for trigger point release, She was  shown how to do trigger point release to her periscpular and upper trap area as well. The patient appears to be making progress but is still frustrated by the fact that her pain levels vary.    Personal Factors and Comorbidities  Comorbidity 1;Comorbidity 2;Profession    Comorbidities  history of cervical spine pain, TMJ    Examination-Activity Limitations  Lift;Reach Overhead;Locomotion Level    Examination-Participation Restrictions  Shop;Driving;Community Activity;Cleaning;Meal Prep    Stability/Clinical Decision Making  Evolving/Moderate complexity    Clinical Decision Making  Low    Rehab Potential  Good    PT Frequency  2x / week    PT Duration  6 weeks    PT Treatment/Interventions  ADLs/Self Care Home Management;Cryotherapy;Electrical Stimulation;Iontophoresis 4mg /ml Dexamethasone;Moist Heat;Ultrasound;Traction;DME Instruction;Gait training;Stair training;Functional mobility training;Therapeutic activities;Therapeutic exercise;Neuromuscular re-education;Patient/family education;Manual techniques;Passive range of motion;Dry needling;Taping    PT Next Visit Plan  revbiew HEP, review decomopression position, consider manuall therapy to neck and back, consider modalities to neck and back, work on relaxation and breathing techniques, monitor symptoms as it is still very early. Consider PROM of the left shoulder; consider shpards hook    PT Home Exercise Plan  leglengthener, decompression with shoulder sink, cerivical rotation 3x in pain free range, AAROM cane; piriformis stretch, lower trunk rotation    Consulted and Agree with Plan of Care  Patient       Patient will benefit from skilled therapeutic intervention in order to improve the following deficits and impairments:     Visit Diagnosis: 1. Other muscle spasm   2. Cervicalgia   3. Acute pain of left shoulder   4. Stiffness of left shoulder, not elsewhere classified        Problem List Patient Active Problem List   Diagnosis  Date Noted  . Overactive bladder 11/19/2018  . Photosensitization due to sun 11/02/2018  . Generalized pain 11/02/2018  . High risk medication use 11/02/2018  . Foot pain, bilateral 05/10/2018  . Heel pain, bilateral 05/10/2018  . Plantar fasciitis 05/10/2018  . Left knee pain 05/10/2018  . Patellofemoral pain syndrome of both knees 05/10/2018  . Generalized abdominal pain 04/17/2018  . Flank pain 04/17/2018  . Acute back pain 04/17/2018  . History of renal stone 04/17/2018  . Urinary frequency 04/17/2018  . Laceration of left hand 01/31/2018  . Decreased range of motion of finger of left hand 01/31/2018  . Localized swelling on left hand 01/31/2018  . Chronic migraine without aura without status migrainosus, not intractable 11/01/2016  . History of migraine 06/10/2016  . Diarrhea 05/20/2016  . Pain in the chest 05/11/2016  . Dizziness and giddiness 05/11/2016  . History of hypertension 05/11/2016  . Hemorrhoidal skin tag 04/07/2015  . IUD (intrauterine device) in place 05/03/2013  . Allergic rhinitis 01/11/2013  . Current smoker 01/11/2013  . Weight gain 06/26/2012  . Cervical stenosis (uterine cervix) 04/25/2012  . Anticardiolipin antibody positive   . Hyperlipidemia   . Hypertension   . History of trichomoniasis 01/18/2011    Class: History of  . THYROID NODULE, RIGHT 02/05/2008  . GERD 02/05/2008  . NECK PAIN 02/05/2008  Carney Living PT DPT  04/02/2019, 5:33 PM  [facility] [address] [place], [place], [address] Phone: [phone]   Fax:  [phone]  Name: ZENIYAH PEASTER MRN: [id number] Date of Birth: [birth year]

## 2019-04-02 NOTE — Telephone Encounter (Signed)
Patient left a voicemail message stating that Dr. Junius Roads had referred her to get an MRI and x-rays at the same time.  When she arrived to get them done, she will unable to get the x-rays completed because Dr. Junius Roads had arrived her when he did the referral.  [phone].  Thank you.

## 2019-04-02 NOTE — Telephone Encounter (Signed)
[facility] is requesting to fill pt topiramate. Please advise [facility]

## 2019-04-03 MED FILL — TOPIRAMATE 50 MG TABLET: 50 | 30 days supply | Qty: 60 | Fill #0

## 2019-04-03 NOTE — Telephone Encounter (Signed)
Pt aware we changed the order

## 2019-04-03 NOTE — Telephone Encounter (Signed)
Orders for xray was changed day before and sw scheduler there to inform this has been done.

## 2019-04-04 ENCOUNTER — Ambulatory Visit
Admission: RE | Admit: 2019-04-04 | Discharge: 2019-04-04 | Disposition: A | Discharge status: Home / Self Care | Payer: 59 | Source: Ambulatory Visit | Attending: Family Medicine | Admitting: Family Medicine

## 2019-04-04 ENCOUNTER — Ambulatory Visit: Payer: 59

## 2019-04-04 ENCOUNTER — Telehealth: Payer: Self-pay | Admitting: Family Medicine

## 2019-04-04 ENCOUNTER — Other Ambulatory Visit: Payer: Self-pay

## 2019-04-04 DIAGNOSIS — G8929 Other chronic pain: Secondary | ICD-10-CM | POA: Diagnosis not present

## 2019-04-04 DIAGNOSIS — M25512 Pain in left shoulder: Secondary | ICD-10-CM

## 2019-04-04 DIAGNOSIS — M542 Cervicalgia: Secondary | ICD-10-CM | POA: Diagnosis not present

## 2019-04-04 DIAGNOSIS — R2689 Other abnormalities of gait and mobility: Secondary | ICD-10-CM

## 2019-04-04 DIAGNOSIS — M545 Low back pain, unspecified: Secondary | ICD-10-CM

## 2019-04-04 DIAGNOSIS — M4802 Spinal stenosis, cervical region: Secondary | ICD-10-CM | POA: Diagnosis not present

## 2019-04-04 DIAGNOSIS — M62838 Other muscle spasm: Secondary | ICD-10-CM | POA: Diagnosis not present

## 2019-04-04 DIAGNOSIS — M25612 Stiffness of left shoulder, not elsewhere classified: Secondary | ICD-10-CM | POA: Diagnosis not present

## 2019-04-04 DIAGNOSIS — S3992XA Unspecified injury of lower back, initial encounter: Secondary | ICD-10-CM | POA: Diagnosis not present

## 2019-04-04 DIAGNOSIS — M25562 Pain in left knee: Secondary | ICD-10-CM | POA: Diagnosis not present

## 2019-04-04 DIAGNOSIS — M50322 Other cervical disc degeneration at C5-C6 level: Secondary | ICD-10-CM | POA: Diagnosis not present

## 2019-04-04 NOTE — Telephone Encounter (Signed)
Patient called to request an RX refill on her Hydrocodone.  Patient stated that she is having muscle spasms really bad.  She does have the muscle relaxers, but no pain medication.  CB#[phone].  Thank you.

## 2019-04-04 NOTE — Therapy (Signed)
[place], [place], [address] Phone: [phone]   Fax:  [phone]  Physical Therapy Treatment  Patient Details  Name: Kelly [name] MRN: [id number] Date of Birth: [birth year] Referring Provider (PT): Dr Eunice Blase    Encounter Date: 04/04/2019  PT End of Session - 04/04/19 1739    Visit Number  8    Number of Visits  12    Date for PT Re-Evaluation  04/22/19    Authorization Type  [facility]    PT Start Time  0332    PT Stop Time  0430    PT Time Calculation (min)  58 min    Activity Tolerance  Patient tolerated treatment well    Behavior During Therapy  [facility] for tasks assessed/performed       Past Medical History:  Diagnosis Date  . Anticardiolipin antibody positive   . HSV-2 (herpes simplex virus 2) infection   . Hyperlipidemia   . Hypertension    diet controlled  . Kidney stone   . Migraine   . Renal calculus, right   . Sleep apnea   . TMJ (dislocation of temporomandibular joint)     Past Surgical History:  Procedure Laterality Date  . CESAREAN SECTION     X3   LAST ONE 11-30-2004  . CYSTOSCOPY W/ RETROGRADES Right 10/29/2012   Procedure: CYSTOSCOPY WITH RETROGRADE PYELOGRAM and stent placement;  Surgeon: Reece Packer, MD;  Location: [employer];  Service: Urology;  Laterality: Right;  rt stent placement , rt retrograde and cysto   . CYSTOSCOPY W/ URETERAL STENT PLACEMENT Right 11/23/2012   Procedure: CYSTOSCOPY WITH STENT REPLACEMENT;  Surgeon: Alexis Frock, MD;  Location: [facility];  Service: Urology;  Laterality: Right;  . CYSTOSCOPY/RETROGRADE/URETEROSCOPY/STONE EXTRACTION WITH BASKET Right 11/23/2012   Procedure: CYSTOSCOPY/RETROGRADE/URETEROSCOPY/STONE EXTRACTION WITH BASKET;  Surgeon: Alexis Frock, MD;  Location: [facility];  Service: Urology;  Laterality: Right;    There were no vitals filed for this visit.  Subjective  Assessment - 04/04/19 1533    Subjective  She reports back spasm today due to MRI today.      Otherwise  OK . She has not done exercises today.  Exercise help some . Have used ball for STW    Pain Score  5     Pain Location  Neck    Pain Orientation  Left    Pain Descriptors / Indicators  Aching    Pain Type  Chronic pain    Pain Onset  More than a month ago    Pain Frequency  Constant    Pain Score  6    Pain Location  Back    Pain Orientation  Right;Lower    Pain Descriptors / Indicators  Aching    Pain Type  Chronic pain    Pain Onset  More than a month ago    Pain Frequency  Constant                       [facility] Adult PT Treatment/Exercise - 04/04/19 0001      Lumbar Exercises: Stretches   Piriformis Stretch  2 reps;30 seconds      Lumbar Exercises: Aerobic   Nustep  L4 UE/LE 5 min      Lumbar Exercises: Standing   Other Standing Lumbar Exercises  --    Other Standing Lumbar Exercises  trigger point release with theracane shown low  back and neck and shoulders       Lumbar Exercises: Supine   Pelvic Tilt  10 reps    Pelvic Tilt Limitations  with emphasis on long strong exhalation to facilitte abdominminals and flatter back.     Bent Knee Raise  20 reps    Bent Knee Raise Limitations  10 reps RT/LT with exhalation  forced to facilitate abs.     Bridge  20 reps    Other Supine Lumbar Exercises  clam red band x 10 reps  with exhalation  to facilitate abs      Moist Heat Therapy   Number Minutes Moist Heat  15 Minutes    Moist Heat Location  Cervical;Lumbar Spine      Electrical Stimulation   Electrical Stimulation Location  cervical spine / right lumbar     Electrical Stimulation Action  IFC    Electrical Stimulation Parameters  to tolerance    Electrical Stimulation Goals  Pain      Manual Therapy   Manual Therapy  Passive ROM    Manual therapy comments  --    Soft tissue mobilization  to cervical parapinals, upper trap , RT QL STW and stretch     Passive ROM  Neck sidebending appears full rane and Range of each shoulder normal    Manual Traction  Cervical with 30 sec holds      Neck Exercises: Stretches   Upper Trapezius Stretch  3 reps;20 seconds    Levator Stretch  3 reps;20 seconds               PT Short Term Goals - 04/02/19 1728      PT SHORT TERM GOAL #1   Title  Patient will be indepdent with relaxation and breathing techniques to reduce actue inflamtion    Time  3    Period  Weeks    Status  On-going      PT SHORT TERM GOAL #2   Title  Patient will report no radiating pain down her left arm    Time  3    Period  Weeks    Status  On-going      PT SHORT TERM GOAL #3   Title  Patient will depnistrate 130 degrees of active shoulder flexion    Time  3    Period  Weeks    Status  On-going        PT Long Term Goals - 03/12/19 2094      PT LONG TERM GOAL #1   Title  Patient will demonstrate 70 degrees of passive ROM bilateral without pain    Time  6    Period  Weeks    Status  New    Target Date  04/23/19      PT LONG TERM GOAL #2   Title  Patient will demonstrate full use of her left arm without increased pain    Time  6    Period  Weeks    Status  New    Target Date  04/23/19      PT LONG TERM GOAL #3   Title  Patient will ambulate without lower back pain in order to perfrom ADL's    Time  6    Period  Weeks    Status  New    Target Date  04/23/19            Plan - 04/04/19 1601    Clinical Impression Statement  Both  shoulder ROM WNL  with free motion .  Continue w Added deep breath with exhalation to exer to recruit diaphram and abs for core stabilityith primary PT plan.    PT Treatment/Interventions  ADLs/Self Care Home Management;Cryotherapy;Electrical Stimulation;Iontophoresis 4mg /ml Dexamethasone;Moist Heat;Ultrasound;Traction;DME Instruction;Gait training;Stair training;Functional mobility training;Therapeutic activities;Therapeutic exercise;Neuromuscular  re-education;Patient/family education;Manual techniques;Passive range of motion;Dry needling;Taping    PT Next Visit Plan  revbiew HEP, review decomopression position, consider manuall therapy to neck and back, consider modalities to neck and back, work on relaxation and breathing techniques, monitor symptoms as it is still very early. Consider PROM of the left shoulder; consider shpards hook    PT Home Exercise Plan  leg lengthener, decompression with shoulder sink, cerivical rotation 3x in pain free range, AAROM cane; piriformis stretch, lower trunk rotation       Patient will benefit from skilled therapeutic intervention in order to improve the following deficits and impairments:  Pain, Increased muscle spasms, Decreased activity tolerance, Decreased strength, Decreased range of motion  Visit Diagnosis: 1. Cervicalgia   2. Other muscle spasm   3. Acute pain of left shoulder   4. Stiffness of left shoulder, not elsewhere classified   5. Chronic pain of left knee   6. Other abnormalities of gait and mobility        Problem List Patient Active Problem List   Diagnosis Date Noted  . Overactive bladder 11/19/2018  . Photosensitization due to sun 11/02/2018  . Generalized pain 11/02/2018  . High risk medication use 11/02/2018  . Foot pain, bilateral 05/10/2018  . Heel pain, bilateral 05/10/2018  . Plantar fasciitis 05/10/2018  . Left knee pain 05/10/2018  . Patellofemoral pain syndrome of both knees 05/10/2018  . Generalized abdominal pain 04/17/2018  . Flank pain 04/17/2018  . Acute back pain 04/17/2018  . History of renal stone 04/17/2018  . Urinary frequency 04/17/2018  . Laceration of left hand 01/31/2018  . Decreased range of motion of finger of left hand 01/31/2018  . Localized swelling on left hand 01/31/2018  . Chronic migraine without aura without status migrainosus, not intractable 11/01/2016  . History of migraine 06/10/2016  . Diarrhea 05/20/2016  . Pain in the  chest 05/11/2016  . Dizziness and giddiness 05/11/2016  . History of hypertension 05/11/2016  . Hemorrhoidal skin tag 04/07/2015  . IUD (intrauterine device) in place 05/03/2013  . Allergic rhinitis 01/11/2013  . Current smoker 01/11/2013  . Weight gain 06/26/2012  . Cervical stenosis (uterine cervix) 04/25/2012  . Anticardiolipin antibody positive   . Hyperlipidemia   . Hypertension   . History of trichomoniasis 01/18/2011    Class: History of  . THYROID NODULE, RIGHT 02/05/2008  . GERD 02/05/2008  . NECK PAIN 02/05/2008    Darrel Hoover PT 04/04/2019, 5:44 PM  [facility] [address] [place], [place], [address] Phone: [phone]   Fax:  [phone]  Name: Kelly [name] MRN: [id number] Date of Birth: 02-26-1966

## 2019-04-04 NOTE — Telephone Encounter (Signed)
I called the patient to let her know it may be tomorrow morning before I have a response on this request, as Dr. Junius Roads is not in clinic this afternoon. She said her back just started spasming again this morning, while lying on the table for the MRI. She does still have muscle relaxers and she has a PT appointment this afternoon.

## 2019-04-05 ENCOUNTER — Telehealth: Payer: Self-pay | Admitting: Family Medicine

## 2019-04-05 MED ORDER — HYDROCODONE-ACETAMINOPHEN 5-325 MG PO TABS: 1.0000 | ORAL_TABLET | Freq: Two times a day (BID) | ORAL | 0 refills | Status: DC | PRN

## 2019-04-05 MED FILL — HYDROCODON-APAP 5-325: 5-325 | 5 days supply | Qty: 20 | Fill #0

## 2019-04-05 NOTE — Telephone Encounter (Signed)
Neck MRI scan shows a large disc protrusion at C4-5 which is flattening the spinal cord and causing narrowing of the spinal canal.  This is the most likely source of the pain.  There are a couple smaller bulging disks no spurs but they do not appear to be causing any compression.  Treatment options would include referral for epidural injection, or surgical consult.

## 2019-04-05 NOTE — Telephone Encounter (Signed)
Sent!

## 2019-04-05 NOTE — Telephone Encounter (Signed)
I called and advised the patient the medication has been sent in to her pharmacy. Dr. Junius Roads will be sending her a MyChart message on her MRI/xray results.

## 2019-04-09 ENCOUNTER — Other Ambulatory Visit: Payer: 59

## 2019-04-10 ENCOUNTER — Ambulatory Visit (INDEPENDENT_AMBULATORY_CARE_PROVIDER_SITE_OTHER): Payer: 59 | Admitting: Orthopaedic Surgery

## 2019-04-10 ENCOUNTER — Encounter: Payer: Self-pay | Admitting: Orthopaedic Surgery

## 2019-04-10 DIAGNOSIS — M5020 Other cervical disc displacement, unspecified cervical region: Secondary | ICD-10-CM | POA: Insufficient documentation

## 2019-04-10 DIAGNOSIS — M4802 Spinal stenosis, cervical region: Secondary | ICD-10-CM | POA: Diagnosis not present

## 2019-04-10 MED ORDER — PREDNISONE 5 MG (21) PO TBPK: ORAL_TABLET | ORAL | 0 refills | Status: DC

## 2019-04-10 MED FILL — predniSONE 5 MG TABS: 5 | 6 days supply | Qty: 21 | Fill #0
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
## 2019-04-11 ENCOUNTER — Ambulatory Visit: Payer: 59 | Admitting: Physical Therapy

## 2019-04-11 ENCOUNTER — Other Ambulatory Visit: Payer: 59

## 2019-04-14 NOTE — Progress Notes (Signed)
Patient is here today with complaint of left foot pain.  This is been ongoing for several months.  She states that both her feet hurt but the left is the worst.  She has tried oral inflamed anti-inflammatories, injections, night splints, and inserts.  She has had no relief in pain so far.  Patient states that her left foot still hurting her, she did have some pain relief after the first treatment, but the second treatment she has not had as much relief.   Pain on palpation to left heel.  ESWT administered to 10.5 J and tolerated well.  Advised her to avoid NSAIDs and ice, and utilize her boot 2 to 3 days posttreatment.  She is to follow up in 2 weeks for 5th treatment.

## 2019-04-15 ENCOUNTER — Other Ambulatory Visit: Payer: Self-pay | Admitting: Family Medicine

## 2019-04-15 ENCOUNTER — Ambulatory Visit: Payer: 59 | Admitting: Physical Therapy

## 2019-04-15 MED ORDER — HYDROCODONE-ACETAMINOPHEN 5-325 MG PO TABS: 1.0000 | ORAL_TABLET | Freq: Two times a day (BID) | ORAL | 0 refills | Status: DC | PRN

## 2019-04-15 MED FILL — HYDROCODON-APAP 5-325: 5-325 | 5 days supply | Qty: 20 | Fill #0

## 2019-04-15 NOTE — Telephone Encounter (Signed)
Please advise 

## 2019-04-15 NOTE — Telephone Encounter (Signed)
hilts

## 2019-04-16 DIAGNOSIS — G959 Disease of spinal cord, unspecified: Secondary | ICD-10-CM | POA: Diagnosis not present

## 2019-04-16 DIAGNOSIS — M542 Cervicalgia: Secondary | ICD-10-CM | POA: Diagnosis not present

## 2019-04-16 MED FILL — traMADol HCL 50 MG TABS: 50 | 10 days supply | Qty: 60 | Fill #0

## 2019-04-16 MED FILL — MELOXICAM 15 MG TABLET: 15 | 60 days supply | Qty: 60 | Fill #0

## 2019-04-16 MED FILL — CYCLOBENZAPRINE 5 MG TABLET: 5 | 30 days supply | Qty: 60 | Fill #0

## 2019-04-16 MED FILL — METHOCARBAMOL 500 MG TABS: 500 | 15 days supply | Qty: 60 | Fill #0

## 2019-04-17 ENCOUNTER — Ambulatory Visit: Payer: 59 | Admitting: Physical Therapy

## 2019-04-18 ENCOUNTER — Other Ambulatory Visit: Payer: Self-pay

## 2019-04-18 ENCOUNTER — Ambulatory Visit: Payer: 59 | Admitting: Physical Therapy

## 2019-04-18 DIAGNOSIS — M62838 Other muscle spasm: Secondary | ICD-10-CM

## 2019-04-18 DIAGNOSIS — M542 Cervicalgia: Secondary | ICD-10-CM

## 2019-04-18 DIAGNOSIS — M25612 Stiffness of left shoulder, not elsewhere classified: Secondary | ICD-10-CM | POA: Diagnosis not present

## 2019-04-18 DIAGNOSIS — M25562 Pain in left knee: Secondary | ICD-10-CM | POA: Diagnosis not present

## 2019-04-18 DIAGNOSIS — R2689 Other abnormalities of gait and mobility: Secondary | ICD-10-CM | POA: Diagnosis not present

## 2019-04-18 DIAGNOSIS — M25512 Pain in left shoulder: Secondary | ICD-10-CM | POA: Diagnosis not present

## 2019-04-18 DIAGNOSIS — G8929 Other chronic pain: Secondary | ICD-10-CM | POA: Diagnosis not present

## 2019-04-19 ENCOUNTER — Encounter: Payer: Self-pay | Admitting: Physical Therapy
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
## 2019-04-23 ENCOUNTER — Ambulatory Visit: Payer: 59 | Attending: Family Medicine | Admitting: Physical Therapy

## 2019-04-23 ENCOUNTER — Encounter: Payer: Self-pay | Admitting: Physical Therapy

## 2019-04-23 ENCOUNTER — Other Ambulatory Visit: Payer: Self-pay

## 2019-04-23 DIAGNOSIS — M25612 Stiffness of left shoulder, not elsewhere classified: Secondary | ICD-10-CM | POA: Diagnosis not present

## 2019-04-23 DIAGNOSIS — M25512 Pain in left shoulder: Secondary | ICD-10-CM | POA: Diagnosis not present

## 2019-04-23 DIAGNOSIS — G8929 Other chronic pain: Secondary | ICD-10-CM | POA: Insufficient documentation

## 2019-04-23 DIAGNOSIS — M25562 Pain in left knee: Secondary | ICD-10-CM | POA: Diagnosis not present

## 2019-04-23 DIAGNOSIS — M542 Cervicalgia: Secondary | ICD-10-CM | POA: Diagnosis not present

## 2019-04-23 DIAGNOSIS — M62838 Other muscle spasm: Secondary | ICD-10-CM

## 2019-04-24 ENCOUNTER — Encounter: Payer: Self-pay | Admitting: Physical Therapy

## 2019-04-24 NOTE — Therapy (Signed)
[place], [place], [address] Phone: [phone]   Fax:  [phone]  Physical Therapy Treatment  Patient Details  Name: Kelly [name] MRN: [id number] Date of Birth: 08-10-1966 Referring Provider (PT): Dr Joanne Chars   Encounter Date: 04/23/2019  PT End of Session - 04/24/19 0800    Visit Number  9    Number of Visits  12    Date for PT Re-Evaluation  05/31/19    Authorization Type  [facility]    PT Start Time  [id number]    PT Stop Time  1634    PT Time Calculation (min)  49 min    Activity Tolerance  Patient tolerated treatment well    Behavior During Therapy  [facility] for tasks assessed/performed       Past Medical History:  Diagnosis Date  . Anticardiolipin antibody positive   . HSV-2 (herpes simplex virus 2) infection   . Hyperlipidemia   . Hypertension    diet controlled  . Kidney stone   . Migraine   . Renal calculus, right   . Sleep apnea   . TMJ (dislocation of temporomandibular joint)     Past Surgical History:  Procedure Laterality Date  . CESAREAN SECTION     X3   LAST ONE 11-30-2004  . CYSTOSCOPY W/ RETROGRADES Right 10/29/2012   Procedure: CYSTOSCOPY WITH RETROGRADE PYELOGRAM and stent placement;  Surgeon: Reece Packer, MD;  Location: [place];  Service: Urology;  Laterality: Right;  rt stent placement , rt retrograde and cysto   . CYSTOSCOPY W/ URETERAL STENT PLACEMENT Right 11/23/2012   Procedure: CYSTOSCOPY WITH STENT REPLACEMENT;  Surgeon: Alexis Frock, MD;  Location: [facility];  Service: Urology;  Laterality: Right;  . CYSTOSCOPY/RETROGRADE/URETEROSCOPY/STONE EXTRACTION WITH BASKET Right 11/23/2012   Procedure: CYSTOSCOPY/RETROGRADE/URETEROSCOPY/STONE EXTRACTION WITH BASKET;  Surgeon: Alexis Frock, MD;  Location: [facility];  Service: Urology;  Laterality: Right;    There were no vitals filed for this visit.  Subjective  Assessment - 04/23/19 1522    Subjective  Patient reports her spasms have decreased. She has been taking her medications. She feels nervous about moving.    Pertinent History  TMJ, migranes,    Limitations  Other (comment)    How long can you stand comfortably?  increased lower back when standing    How long can you walk comfortably?  increased pain with ambualtion    Diagnostic tests  No x-rays in the chart.    Currently in Pain?  Yes    Pain Score  3     Pain Location  Neck    Pain Orientation  Left    Pain Descriptors / Indicators  Aching    Pain Type  Chronic pain    Pain Onset  More than a month ago    Pain Frequency  Constant    Aggravating Factors   movement of the head    Pain Relieving Factors  rest    Effect of Pain on Daily Activities  difficulty perfroming ADL's                       [facility] Adult PT Treatment/Exercise - 04/24/19 0001      Neck Exercises: Seated   Other Seated Exercise  scap retractions 2x15; shoulder rolls 2x10;       Modalities   Modalities  Traction      Traction   Type of  Traction  Cervical    Min (lbs)  8    Max (lbs)  12    Hold Time  60     Rest Time  10     Time  7 minutes       Manual Therapy   Manual Therapy  Passive ROM    Soft tissue mobilization  to cervical parapinals, upper trap , IASTYM to upper trap and shoulder.     Passive ROM  Neck sidebending appears full rane and Range of each shoulder normal    Manual Traction  Cervical with 30 sec holds             PT Education - 04/23/19 1545    Education Details  benefits of mechanical traction    Person(s) Educated  Patient    Methods  Explanation;Demonstration;Tactile cues;Verbal cues    Comprehension  Returned demonstration;Verbal cues required;Verbalized understanding;Tactile cues required       PT Short Term Goals - 04/19/19 0749      PT SHORT TERM GOAL #1   Title  Patient will be indepdent with relaxation and breathing techniques to reduce actue  inflamtion    Baseline  reviewed decompression position    Time  3    Period  Weeks    Status  On-going      PT SHORT TERM GOAL #2   Title  Patient will report no radiating pain down her left arm    Baseline  intermitent depending on activity    Time  3    Period  Weeks    Status  On-going      PT SHORT TERM GOAL #3   Title  Patient will report 3/10 pain at worst after work    Time  3    Period  Weeks    Status  New    Target Date  05/10/19        PT Long Term Goals - 04/19/19 0751      PT LONG TERM GOAL #1   Title  Patient will demonstrate 70 degrees of passive rotation ROM bilateral without pain    Baseline  initial measure 0 degrees    Time  6    Period  Weeks    Status  On-going      PT LONG TERM GOAL #2   Title  Patient will sit at her desk for 4 hours without pain    Time  6    Period  Weeks    Status  New    Target Date  05/31/19      PT LONG TERM GOAL #3   Title  Patient will ambulate without lower back pain in order to perfrom ADL's    Time  6    Period  Weeks    Status  On-going            Plan - 04/24/19 0801    Clinical Impression Statement  Patients spasming is decreasing as well as her pain. Therapy had patient perfrom retractions and shoulder rolls. She was hesitant to do eaither. Therapy adivsed her she can move a little she dosent have to stay perfectly still adn stiff. Therapy trialed mechanical traction. The machine would not go intemittent and she reported a minor increase in pain so treatment was halted at 7 min. She was started at a low load of 12lb max 8 min. Therapy will advance if able.    Personal Factors and Comorbidities  Comorbidity 1;Comorbidity 2;Profession  Comorbidities  history of cervical spine pain, TMJ    Examination-Activity Limitations  Lift;Reach Overhead;Locomotion Level    Examination-Participation Restrictions  Shop;Driving;Community Activity;Cleaning;Meal Prep    Stability/Clinical Decision Making   Evolving/Moderate complexity    Clinical Decision Making  Low    Rehab Potential  Good    PT Frequency  2x / week    PT Duration  6 weeks    PT Treatment/Interventions  ADLs/Self Care Home Management;Cryotherapy;Electrical Stimulation;Iontophoresis 4mg /ml Dexamethasone;Moist Heat;Ultrasound;Traction;DME Instruction;Gait training;Stair training;Functional mobility training;Therapeutic activities;Therapeutic exercise;Neuromuscular re-education;Patient/family education;Manual techniques;Passive range of motion;Dry needling;Taping    PT Next Visit Plan  revbiew HEP, review decomopression position, consider manuall therapy to neck and back, consider modalities to neck and back, work on relaxation and breathing techniques, monitor symptoms as it is still very early. Consider PROM of the left shoulder; consider shpards hook    PT Home Exercise Plan  leg lengthener, decompression with shoulder sink, cerivical rotation 3x in pain free range, AAROM cane; piriformis stretch, lower trunk rotation       Patient will benefit from skilled therapeutic intervention in order to improve the following deficits and impairments:  Pain, Increased muscle spasms, Decreased activity tolerance, Decreased strength, Decreased range of motion  Visit Diagnosis: Cervicalgia  Other muscle spasm  Acute pain of left shoulder  Stiffness of left shoulder, not elsewhere classified     Problem List Patient Active Problem List   Diagnosis Date Noted  . Protrusion of cervical intervertebral disc 04/10/2019  . Spinal stenosis of cervical region 04/10/2019  . Overactive bladder 11/19/2018  . Photosensitization due to sun 11/02/2018  . Generalized pain 11/02/2018  . High risk medication use 11/02/2018  . Foot pain, bilateral 05/10/2018  . Heel pain, bilateral 05/10/2018  . Plantar fasciitis 05/10/2018  . Left knee pain 05/10/2018  . Patellofemoral pain syndrome of both knees 05/10/2018  . Generalized abdominal pain  04/17/2018  . Flank pain 04/17/2018  . Acute back pain 04/17/2018  . History of renal stone 04/17/2018  . Urinary frequency 04/17/2018  . Laceration of left hand 01/31/2018  . Decreased range of motion of finger of left hand 01/31/2018  . Localized swelling on left hand 01/31/2018  . Chronic migraine without aura without status migrainosus, not intractable 11/01/2016  . History of migraine 06/10/2016  . Diarrhea 05/20/2016  . Pain in the chest 05/11/2016  . Dizziness and giddiness 05/11/2016  . History of hypertension 05/11/2016  . Hemorrhoidal skin tag 04/07/2015  . IUD (intrauterine device) in place 05/03/2013  . Allergic rhinitis 01/11/2013  . Current smoker 01/11/2013  . Weight gain 06/26/2012  . Cervical stenosis (uterine cervix) 04/25/2012  . Anticardiolipin antibody positive   . Hyperlipidemia   . Hypertension   . History of trichomoniasis 01/18/2011    Class: History of  . THYROID NODULE, RIGHT 02/05/2008  . GERD 02/05/2008  . NECK PAIN 02/05/2008    Carney Living PT DPT  04/24/2019, 8:23 AM  [facility] [address] [place], [place], [address] Phone: [phone]   Fax:  [phone]  Name: Kelly [name] MRN: [id number] Date of Birth: Sep 20, 1965

## 2019-04-26 ENCOUNTER — Ambulatory Visit: Payer: 59 | Admitting: Physical Therapy

## 2019-04-26 ENCOUNTER — Other Ambulatory Visit: Payer: Self-pay | Admitting: Family Medicine

## 2019-04-26 MED ORDER — HYDROCODONE-ACETAMINOPHEN 5-325 MG PO TABS: 1.0000 | ORAL_TABLET | Freq: Two times a day (BID) | ORAL | 0 refills | Status: DC | PRN

## 2019-04-26 MED FILL — HYDROCODON-APAP 5-325: 5-325 | 5 days supply | Qty: 20 | Fill #0

## 2019-04-26 NOTE — Telephone Encounter (Signed)
Please advise 

## 2019-04-26 NOTE — Progress Notes (Signed)
Patient is here today with complaint of left foot pain.  This is been ongoing for several months.  She states that both her feet hurt but the left is the worst.  She has tried oral inflamed anti-inflammatories, injections, night splints, and inserts.  She said that she has noticed a slight difference in her pain so far.  Pain on palpation to left heel.  ESWT administered to 11.5 J and tolerated well.  Advised her to avoid NSAIDs and ice, and utilize her boot 2 to 3 days posttreatment.  She is to follow up in 2 weeks for 6th treatment.

## 2019-04-26 NOTE — Telephone Encounter (Signed)
Hilts patient.  

## 2019-04-30 ENCOUNTER — Other Ambulatory Visit: Payer: Self-pay

## 2019-04-30 ENCOUNTER — Ambulatory Visit: Payer: 59 | Admitting: Physical Therapy

## 2019-04-30 DIAGNOSIS — M542 Cervicalgia: Secondary | ICD-10-CM | POA: Diagnosis not present

## 2019-04-30 DIAGNOSIS — M25512 Pain in left shoulder: Secondary | ICD-10-CM | POA: Diagnosis not present

## 2019-04-30 DIAGNOSIS — M25612 Stiffness of left shoulder, not elsewhere classified: Secondary | ICD-10-CM

## 2019-04-30 DIAGNOSIS — M62838 Other muscle spasm: Secondary | ICD-10-CM | POA: Diagnosis not present

## 2019-04-30 DIAGNOSIS — G8929 Other chronic pain: Secondary | ICD-10-CM | POA: Diagnosis not present

## 2019-04-30 DIAGNOSIS — M25562 Pain in left knee: Secondary | ICD-10-CM | POA: Diagnosis not present

## 2019-05-01 ENCOUNTER — Encounter: Payer: Self-pay | Admitting: Physical Therapy

## 2019-05-01 NOTE — Therapy (Signed)
[name], [place], [address] Phone: [phone]   Fax:  [phone]  Physical Therapy Treatment  Patient Details  Name: Kelly [name] MRN: [id number] Date of Birth: 10-17-1965 Referring Provider (PT): Dr Joanne Chars   Encounter Date: 04/30/2019  PT End of Session - 05/01/19 0844    Visit Number  10    Number of Visits  12    Date for PT Re-Evaluation  05/31/19    Authorization Type  [facility] [employer]    PT Start Time  1500    PT Stop Time  1540    PT Time Calculation (min)  40 min    Activity Tolerance  Patient tolerated treatment well       Past Medical History:  Diagnosis Date  . Anticardiolipin antibody positive   . HSV-2 (herpes simplex virus 2) infection   . Hyperlipidemia   . Hypertension    diet controlled  . Kidney stone   . Migraine   . Renal calculus, right   . Sleep apnea   . TMJ (dislocation of temporomandibular joint)     Past Surgical History:  Procedure Laterality Date  . CESAREAN SECTION     X3   LAST ONE 11-30-2004  . CYSTOSCOPY W/ RETROGRADES Right 10/29/2012   Procedure: CYSTOSCOPY WITH RETROGRADE PYELOGRAM and stent placement;  Surgeon: Reece Packer, MD;  Location: [facility];  Service: Urology;  Laterality: Right;  rt stent placement , rt retrograde and cysto   . CYSTOSCOPY W/ URETERAL STENT PLACEMENT Right 11/23/2012   Procedure: CYSTOSCOPY WITH STENT REPLACEMENT;  Surgeon: Alexis Frock, MD;  Location: [facility];  Service: Urology;  Laterality: Right;  . CYSTOSCOPY/RETROGRADE/URETEROSCOPY/STONE EXTRACTION WITH BASKET Right 11/23/2012   Procedure: CYSTOSCOPY/RETROGRADE/URETEROSCOPY/STONE EXTRACTION WITH BASKET;  Surgeon: Alexis Frock, MD;  Location: [facility];  Service: Urology;  Laterality: Right;    There were no vitals filed for this visit.  Subjective Assessment - 05/01/19 0832    Subjective  Patient reports it might be  a little better. She did have radicular symptoms on Saturday going into her pinkie but they resolved. She is very nervous about using her arms.    Pertinent History  TMJ, migranes,    Limitations  Other (comment)    How long can you stand comfortably?  increased lower back when standing    How long can you walk comfortably?  increased pain with ambualtion    Diagnostic tests  No x-rays in the chart.    Patient Stated Goals  to have less pain    Currently in Pain?  No/denies    Pain Score  3     Pain Location  Neck    Pain Orientation  Left    Pain Descriptors / Indicators  Aching    Pain Type  Chronic pain    Pain Radiating Towards  into Pinkie on Saturday    Pain Onset  More than a month ago    Pain Frequency  Constant    Aggravating Factors   movement of the head    Pain Relieving Factors  rest    Multiple Pain Sites  No                       [facility] Adult PT Treatment/Exercise - 05/01/19 0001      Neck Exercises: Standing   Other Standing Exercises  scap retraction 2x10 yellow; shoulder extension 2x10 yellow  Neck Exercises: Supine   Other Supine Exercise  bilateral ER yellow 2x10       Moist Heat Therapy   Number Minutes Moist Heat  15 Minutes      Electrical Stimulation   Electrical Stimulation Location  cervical spine / right lumbar     Electrical Stimulation Action  IFC    Electrical Stimulation Parameters  to tolerance     Electrical Stimulation Goals  Pain      Manual Therapy   Manual Therapy  Passive ROM    Soft tissue mobilization  trigger point release to cervical parapinals, upper trap , IASTYM to upper trap and shoulder.     Manual Traction  gentle cervical traction; sub-occipital release              PT Education - 05/01/19 0843    Education Details  reviewed light exercises    Person(s) Educated  Patient    Methods  Explanation;Demonstration;Tactile cues;Verbal cues    Comprehension  Verbalized understanding;Returned  demonstration;Verbal cues required;Tactile cues required       PT Short Term Goals - 04/19/19 0749      PT SHORT TERM GOAL #1   Title  Patient will be indepdent with relaxation and breathing techniques to reduce actue inflamtion    Baseline  reviewed decompression position    Time  3    Period  Weeks    Status  On-going      PT SHORT TERM GOAL #2   Title  Patient will report no radiating pain down her left arm    Baseline  intermitent depending on activity    Time  3    Period  Weeks    Status  On-going      PT SHORT TERM GOAL #3   Title  Patient will report 3/10 pain at worst after work    Time  3    Period  Weeks    Status  New    Target Date  05/10/19        PT Long Term Goals - 04/19/19 0751      PT LONG TERM GOAL #1   Title  Patient will demonstrate 70 degrees of passive rotation ROM bilateral without pain    Baseline  initial measure 0 degrees    Time  6    Period  Weeks    Status  On-going      PT LONG TERM GOAL #2   Title  Patient will sit at her desk for 4 hours without pain    Time  6    Period  Weeks    Status  New    Target Date  05/31/19      PT LONG TERM GOAL #3   Title  Patient will ambulate without lower back pain in order to perfrom ADL's    Time  6    Period  Weeks    Status  On-going            Plan - 05/01/19 0844    Clinical Impression Statement  Therapy was able to add in light exercises for posutre back into her program. She had no increase in pain. She is very hesitant to move which will likely only exacerbate her symptoms. Shehas increased pain as the day goes on. This is likely postural. She was advised to contact her MD if she starts to hav increased pain. Patient declined mechancal traction 2nd to increased pain while she was on manual therapy  Personal Factors and Comorbidities  Comorbidity 1;Comorbidity 2;Profession    Comorbidities  history of cervical spine pain, TMJ    Examination-Activity Limitations  Lift;Reach  Overhead;Locomotion Level    Examination-Participation Restrictions  Shop;Driving;Community Activity;Cleaning;Meal Prep    Stability/Clinical Decision Making  Evolving/Moderate complexity    Clinical Decision Making  Low    Rehab Potential  Good    PT Frequency  2x / week    PT Duration  6 weeks    PT Treatment/Interventions  ADLs/Self Care Home Management;Cryotherapy;Electrical Stimulation;Iontophoresis 4mg /ml Dexamethasone;Moist Heat;Ultrasound;Traction;DME Instruction;Gait training;Stair training;Functional mobility training;Therapeutic activities;Therapeutic exercise;Neuromuscular re-education;Patient/family education;Manual techniques;Passive range of motion;Dry needling;Taping    PT Next Visit Plan  revbiew HEP, review decomopression position, consider manuall therapy to neck and back, consider modalities to neck and back, work on relaxation and breathing techniques, monitor symptoms as it is still very early. Consider PROM of the left shoulder; consider shpards hook    PT Home Exercise Plan  leg lengthener, decompression with shoulder sink, cerivical rotation 3x in pain free range, AAROM cane; piriformis stretch, lower trunk rotation    Consulted and Agree with Plan of Care  Patient       Patient will benefit from skilled therapeutic intervention in order to improve the following deficits and impairments:  Pain, Increased muscle spasms, Decreased activity tolerance, Decreased strength, Decreased range of motion  Visit Diagnosis: Cervicalgia  Other muscle spasm  Acute pain of left shoulder  Stiffness of left shoulder, not elsewhere classified     Problem List Patient Active Problem List   Diagnosis Date Noted  . Protrusion of cervical intervertebral disc 04/10/2019  . Spinal stenosis of cervical region 04/10/2019  . Overactive bladder 11/19/2018  . Photosensitization due to sun 11/02/2018  . Generalized pain 11/02/2018  . High risk medication use 11/02/2018  . Foot pain,  bilateral 05/10/2018  . Heel pain, bilateral 05/10/2018  . Plantar fasciitis 05/10/2018  . Left knee pain 05/10/2018  . Patellofemoral pain syndrome of both knees 05/10/2018  . Generalized abdominal pain 04/17/2018  . Flank pain 04/17/2018  . Acute back pain 04/17/2018  . History of renal stone 04/17/2018  . Urinary frequency 04/17/2018  . Laceration of left hand 01/31/2018  . Decreased range of motion of finger of left hand 01/31/2018  . Localized swelling on left hand 01/31/2018  . Chronic migraine without aura without status migrainosus, not intractable 11/01/2016  . History of migraine 06/10/2016  . Diarrhea 05/20/2016  . Pain in the chest 05/11/2016  . Dizziness and giddiness 05/11/2016  . History of hypertension 05/11/2016  . Hemorrhoidal skin tag 04/07/2015  . IUD (intrauterine device) in place 05/03/2013  . Allergic rhinitis 01/11/2013  . Current smoker 01/11/2013  . Weight gain 06/26/2012  . Cervical stenosis (uterine cervix) 04/25/2012  . Anticardiolipin antibody positive   . Hyperlipidemia   . Hypertension   . History of trichomoniasis 01/18/2011    Class: History of  . THYROID NODULE, RIGHT 02/05/2008  . GERD 02/05/2008  . NECK PAIN 02/05/2008    Carney Living PT DPT  05/01/2019, 9:04 AM  [facility] [address] [place], [place], [address] Phone: [phone]   Fax:  [phone]  Name: KYLANI CONYER MRN: [id number] Date of Birth: Mar 05, 1966

## 2019-05-03 ENCOUNTER — Encounter

## 2019-05-06 ENCOUNTER — Encounter: Payer: Self-pay | Admitting: Physical Therapy

## 2019-05-06 ENCOUNTER — Other Ambulatory Visit: Payer: Self-pay

## 2019-05-06 ENCOUNTER — Ambulatory Visit: Payer: 59 | Admitting: Physical Therapy

## 2019-05-06 DIAGNOSIS — G8929 Other chronic pain: Secondary | ICD-10-CM | POA: Diagnosis not present

## 2019-05-06 DIAGNOSIS — M25612 Stiffness of left shoulder, not elsewhere classified: Secondary | ICD-10-CM

## 2019-05-06 DIAGNOSIS — M25512 Pain in left shoulder: Secondary | ICD-10-CM | POA: Diagnosis not present

## 2019-05-06 DIAGNOSIS — M62838 Other muscle spasm: Secondary | ICD-10-CM | POA: Diagnosis not present

## 2019-05-06 DIAGNOSIS — M542 Cervicalgia: Secondary | ICD-10-CM | POA: Diagnosis not present

## 2019-05-06 DIAGNOSIS — M25562 Pain in left knee: Secondary | ICD-10-CM | POA: Diagnosis not present

## 2019-05-07 ENCOUNTER — Other Ambulatory Visit: Payer: Self-pay | Admitting: Family Medicine

## 2019-05-07 ENCOUNTER — Encounter: Payer: Self-pay | Admitting: Physical Therapy

## 2019-05-07 MED ORDER — HYDROCODONE-ACETAMINOPHEN 5-325 MG PO TABS: 1.0000 | ORAL_TABLET | Freq: Two times a day (BID) | ORAL | 0 refills | Status: DC | PRN

## 2019-05-07 NOTE — Telephone Encounter (Signed)
Please advise 

## 2019-05-07 NOTE — Therapy (Addendum)
[place], [place], [address] Phone: [phone]   Fax:  [phone]  Physical Therapy Treatment  Patient Details  Name: Kelly [name] MRN: [id number] Date of Birth: 1966/04/01 Referring Provider (PT): Dr Joanne Chars   Encounter Date: 05/06/2019  PT End of Session - 05/07/19 1009    Visit Number  11    Number of Visits  12    Date for PT Re-Evaluation  05/31/19    Authorization Type  [facility]    PT Start Time  [id number]    PT Stop Time  1458    PT Time Calculation (min)  43 min    Activity Tolerance  Patient tolerated treatment well    Behavior During Therapy  [facility] for tasks assessed/performed       Past Medical History:  Diagnosis Date  . Anticardiolipin antibody positive   . HSV-2 (herpes simplex virus 2) infection   . Hyperlipidemia   . Hypertension    diet controlled  . Kidney stone   . Migraine   . Renal calculus, right   . Sleep apnea   . TMJ (dislocation of temporomandibular joint)     Past Surgical History:  Procedure Laterality Date  . CESAREAN SECTION     X3   LAST ONE 11-30-2004  . CYSTOSCOPY W/ RETROGRADES Right 10/29/2012   Procedure: CYSTOSCOPY WITH RETROGRADE PYELOGRAM and stent placement;  Surgeon: Reece Packer, MD;  Location: [facility];  Service: Urology;  Laterality: Right;  rt stent placement , rt retrograde and cysto   . CYSTOSCOPY W/ URETERAL STENT PLACEMENT Right 11/23/2012   Procedure: CYSTOSCOPY WITH STENT REPLACEMENT;  Surgeon: Alexis Frock, MD;  Location: [facility];  Service: Urology;  Laterality: Right;  . CYSTOSCOPY/RETROGRADE/URETEROSCOPY/STONE EXTRACTION WITH BASKET Right 11/23/2012   Procedure: CYSTOSCOPY/RETROGRADE/URETEROSCOPY/STONE EXTRACTION WITH BASKET;  Surgeon: Alexis Frock, MD;  Location: [facility];  Service: Urology;  Laterality: Right;    There were no vitals filed for this visit.  Subjective  Assessment - 05/07/19 1004    Subjective  Patient has no had any more instaces of her hand going numb. Overall she reports it is about the same. It is soretoday but she had a long day at work.    Pertinent History  TMJ, migranes,    Limitations  Other (comment)    How long can you stand comfortably?  increased lower back when standing    How long can you walk comfortably?  increased pain with ambualtion    Diagnostic tests  No x-rays in the chart.    Patient Stated Goals  to have less pain    Currently in Pain?  Yes    Pain Score  3     Pain Location  Neck    Pain Orientation  Left    Pain Descriptors / Indicators  Aching    Pain Type  Chronic pain    Pain Radiating Towards  nothing over the past week    Pain Onset  More than a month ago    Pain Frequency  Constant    Aggravating Factors   movement of the head    Pain Relieving Factors  rest    Effect of Pain on Daily Activities  difficulty perfroming ADL's    Multiple Pain Sites  No                       [facility] Adult PT Treatment/Exercise -  05/07/19 0001      Self-Care   Self-Care  Other Self-Care Comments    Other Self-Care Comments   reviewed self trigger point release with a wand and a tennis ball.       Neck Exercises: Standing   Other Standing Exercises  scap retraction 2x10 yred; shoulder extension 2x10 red      Neck Exercises: Supine   Other Supine Exercise  bilateral ER yellow 2x10       Moist Heat Therapy   Number Minutes Moist Heat  15 Minutes    Moist Heat Location  Cervical;Lumbar Spine      Electrical Stimulation   Electrical Stimulation Location  cervical spine / right lumbar     Electrical Stimulation Goals  Pain      Manual Therapy   Manual Therapy  Passive ROM    Soft tissue mobilization  trigger point release to cervical parapinals, upper trap , IASTYM to upper trap and shoulder.     Manual Traction  gentle cervical traction; sub-occipital release              PT Education -  05/07/19 1008    Education Details  reviewed ther-ex. Reviewed use of tennis ball and trigger point shepards hook    Person(s) Educated  Patient    Methods  Explanation;Demonstration;Tactile cues;Verbal cues    Comprehension  Verbalized understanding;Returned demonstration;Verbal cues required;Tactile cues required       PT Short Term Goals - 05/07/19 1013      PT SHORT TERM GOAL #1   Title  Patient will be indepdent with relaxation and breathing techniques to reduce actue inflamtion    Baseline  reviewed decompression position    Time  3    Period  Weeks    Status  On-going      PT SHORT TERM GOAL #2   Title  Patient will report no radiating pain down her left arm    Baseline  intermitent depending on activity    Time  3    Period  Weeks    Status  On-going    Target Date  07/10/18      PT SHORT TERM GOAL #3   Title  Patient will report 3/10 pain at worst after work    Time  3    Period  Weeks    Status  On-going        PT Long Term Goals - 04/19/19 0751      PT LONG TERM GOAL #1   Title  Patient will demonstrate 70 degrees of passive rotation ROM bilateral without pain    Baseline  initial measure 0 degrees    Time  6    Period  Weeks    Status  On-going      PT LONG TERM GOAL #2   Title  Patient will sit at her desk for 4 hours without pain    Time  6    Period  Weeks    Status  New    Target Date  05/31/19      PT LONG TERM GOAL #3   Title  Patient will ambulate without lower back pain in order to perfrom ADL's    Time  6    Period  Weeks    Status  On-going            Plan - 05/07/19 1009    Clinical Impression Statement  Reviewed self trigger point release to upper trap and peri-scapular  area with a tennis ball and trigger point shepards hook. Patient continued to work on light postural exercises. She had no increase in pain. She is not getting any worse but she is not getting significantly better. She will go back to see Dr Katherina Right next week.     Personal Factors and Comorbidities  Comorbidity 1;Comorbidity 2;Profession    Comorbidities  history of cervical spine pain, TMJ    Examination-Activity Limitations  Lift;Reach Overhead;Locomotion Level    Stability/Clinical Decision Making  Evolving/Moderate complexity    Rehab Potential  Good    PT Frequency  2x / week    PT Duration  6 weeks    PT Treatment/Interventions  ADLs/Self Care Home Management;Cryotherapy;Electrical Stimulation;Iontophoresis 4mg /ml Dexamethasone;Moist Heat;Ultrasound;Traction;DME Instruction;Gait training;Stair training;Functional mobility training;Therapeutic activities;Therapeutic exercise;Neuromuscular re-education;Patient/family education;Manual techniques;Passive range of motion;Dry needling;Taping    Consulted and Agree with Plan of Care  Patient       Patient will benefit from skilled therapeutic intervention in order to improve the following deficits and impairments:  Pain, Increased muscle spasms, Decreased activity tolerance, Decreased strength, Decreased range of motion  Visit Diagnosis: Cervicalgia  Other muscle spasm  Acute pain of left shoulder  Stiffness of left shoulder, not elsewhere classified     Problem List Patient Active Problem List   Diagnosis Date Noted  . Protrusion of cervical intervertebral disc 04/10/2019  . Spinal stenosis of cervical region 04/10/2019  . Overactive bladder 11/19/2018  . Photosensitization due to sun 11/02/2018  . Generalized pain 11/02/2018  . High risk medication use 11/02/2018  . Foot pain, bilateral 05/10/2018  . Heel pain, bilateral 05/10/2018  . Plantar fasciitis 05/10/2018  . Left knee pain 05/10/2018  . Patellofemoral pain syndrome of both knees 05/10/2018  . Generalized abdominal pain 04/17/2018  . Flank pain 04/17/2018  . Acute back pain 04/17/2018  . History of renal stone 04/17/2018  . Urinary frequency 04/17/2018  . Laceration of left hand 01/31/2018  . Decreased range of motion  of finger of left hand 01/31/2018  . Localized swelling on left hand 01/31/2018  . Chronic migraine without aura without status migrainosus, not intractable 11/01/2016  . History of migraine 06/10/2016  . Diarrhea 05/20/2016  . Pain in the chest 05/11/2016  . Dizziness and giddiness 05/11/2016  . History of hypertension 05/11/2016  . Hemorrhoidal skin tag 04/07/2015  . IUD (intrauterine device) in place 05/03/2013  . Allergic rhinitis 01/11/2013  . Current smoker 01/11/2013  . Weight gain 06/26/2012  . Cervical stenosis (uterine cervix) 04/25/2012  . Anticardiolipin antibody positive   . Hyperlipidemia   . Hypertension   . History of trichomoniasis 01/18/2011    Class: History of  . THYROID NODULE, RIGHT 02/05/2008  . GERD 02/05/2008  . NECK PAIN 02/05/2008    Carney Living PT DPT  05/07/2019, 1:02 PM  [facility] [address] [place], [place], [address] Phone: [phone]   Fax:  [phone]  Name: Kelly [name] MRN: [id number] Date of Birth: 07-Jul-1966

## 2019-05-08 ENCOUNTER — Other Ambulatory Visit: Payer: Self-pay

## 2019-05-08 ENCOUNTER — Ambulatory Visit: Payer: 59 | Admitting: Physical Therapy

## 2019-05-08 ENCOUNTER — Encounter: Payer: Self-pay | Admitting: Physical Therapy

## 2019-05-08 DIAGNOSIS — M25612 Stiffness of left shoulder, not elsewhere classified: Secondary | ICD-10-CM | POA: Diagnosis not present

## 2019-05-08 DIAGNOSIS — M62838 Other muscle spasm: Secondary | ICD-10-CM | POA: Diagnosis not present

## 2019-05-08 DIAGNOSIS — M542 Cervicalgia: Secondary | ICD-10-CM

## 2019-05-08 DIAGNOSIS — G8929 Other chronic pain: Secondary | ICD-10-CM | POA: Diagnosis not present

## 2019-05-08 DIAGNOSIS — M25512 Pain in left shoulder: Secondary | ICD-10-CM | POA: Diagnosis not present

## 2019-05-08 DIAGNOSIS — M25562 Pain in left knee: Secondary | ICD-10-CM | POA: Diagnosis not present

## 2019-05-08 MED FILL — HYDROCODON-APAP 5-325: 5-325 | 5 days supply | Qty: 20 | Fill #0

## 2019-05-09 NOTE — Therapy (Signed)
[place], [place], [address] Phone: [phone]   Fax:  [phone]  Physical Therapy Treatment  Patient Details  Name: Kelly [name] MRN: [id number] Date of Birth: March 17, 1966 Referring Provider (PT): Dr Joanne Chars   Encounter Date: 05/08/2019  PT End of Session - 05/08/19 1643    Visit Number  5    Number of Visits  12    Date for PT Re-Evaluation  06/05/19    Authorization Type  [employer]    PT Start Time  1500    PT Stop Time  1556    PT Time Calculation (min)  56 min    Activity Tolerance  Patient tolerated treatment well    Behavior During Therapy  [facility] for tasks assessed/performed       Past Medical History:  Diagnosis Date  . Anticardiolipin antibody positive   . HSV-2 (herpes simplex virus 2) infection   . Hyperlipidemia   . Hypertension    diet controlled  . Kidney stone   . Migraine   . Renal calculus, right   . Sleep apnea   . TMJ (dislocation of temporomandibular joint)     Past Surgical History:  Procedure Laterality Date  . CESAREAN SECTION     X3   LAST ONE 11-30-2004  . CYSTOSCOPY W/ RETROGRADES Right 10/29/2012   Procedure: CYSTOSCOPY WITH RETROGRADE PYELOGRAM and stent placement;  Surgeon: Reece Packer, MD;  Location: [facility];  Service: Urology;  Laterality: Right;  rt stent placement , rt retrograde and cysto   . CYSTOSCOPY W/ URETERAL STENT PLACEMENT Right 11/23/2012   Procedure: CYSTOSCOPY WITH STENT REPLACEMENT;  Surgeon: Alexis Frock, MD;  Location: [facility];  Service: Urology;  Laterality: Right;  . CYSTOSCOPY/RETROGRADE/URETEROSCOPY/STONE EXTRACTION WITH BASKET Right 11/23/2012   Procedure: CYSTOSCOPY/RETROGRADE/URETEROSCOPY/STONE EXTRACTION WITH BASKET;  Surgeon: Alexis Frock, MD;  Location: [facility];  Service: Urology;  Laterality: Right;    There were no vitals filed for this visit.  Subjective  Assessment - 05/09/19 1232    Subjective  Patient is a little more sore today but has had no increase in radicular symptoms. She played tennis yesterday. She did not play she just hit with her daughter. She was advsied that she needs to avoid anything overhead.    Pertinent History  TMJ, migranes,    Limitations  Other (comment)    How long can you stand comfortably?  increased lower back when standing    How long can you walk comfortably?  increased pain with ambualtion    Diagnostic tests  No x-rays in the chart.    Patient Stated Goals  to have less pain    Currently in Pain?  No/denies                       [facility] Adult PT Treatment/Exercise - 05/09/19 0001      Neck Exercises: Standing   Other Standing Exercises  scap retraction 2x10 yred; shoulder extension 2x10 red      Neck Exercises: Seated   Other Seated Exercise  bilateral ER yellow 2x10;       Neck Exercises: Supine   Other Supine Exercise  bilateral ER yellow 2x10       Moist Heat Therapy   Moist Heat Location  Cervical;Lumbar Spine      Electrical Stimulation   Electrical Stimulation Location  cervical spine / right lumbar  Electrical Stimulation Goals  Pain      Manual Therapy   Manual Therapy  Passive ROM    Soft tissue mobilization  trigger point release to cervical parapinals, upper trap , IASTYM to upper trap and shoulder.     Manual Traction  gentle cervical traction; sub-occipital release              PT Education - 05/09/19 1233    Education Details  HEP and symptom mangement    Person(s) Educated  Patient    Methods  Explanation;Demonstration;Tactile cues;Verbal cues    Comprehension  Verbalized understanding;Returned demonstration;Verbal cues required;Tactile cues required       PT Short Term Goals - 05/07/19 1013      PT SHORT TERM GOAL #1   Title  Patient will be indepdent with relaxation and breathing techniques to reduce actue inflamtion    Baseline  reviewed  decompression position    Time  3    Period  Weeks    Status  On-going      PT SHORT TERM GOAL #2   Title  Patient will report no radiating pain down her left arm    Baseline  intermitent depending on activity    Time  3    Period  Weeks    Status  On-going    Target Date  07/10/18      PT SHORT TERM GOAL #3   Title  Patient will report 3/10 pain at worst after work    Time  3    Period  Weeks    Status  On-going        PT Long Term Goals - 04/19/19 0751      PT LONG TERM GOAL #1   Title  Patient will demonstrate 70 degrees of passive rotation ROM bilateral without pain    Baseline  initial measure 0 degrees    Time  6    Period  Weeks    Status  On-going      PT LONG TERM GOAL #2   Title  Patient will sit at her desk for 4 hours without pain    Time  6    Period  Weeks    Status  New    Target Date  05/31/19      PT LONG TERM GOAL #3   Title  Patient will ambulate without lower back pain in order to perfrom ADL's    Time  6    Period  Weeks    Status  On-going            Plan - 05/08/19 1659    Clinical Impression Statement  Kelly [name] trialed ultrasound today to decrease inflammation. She toleratd exercises well today. She had a slight increase in spasmingon the right. She was advised to make sure if she plays tennis she does not go overhead. Therapy will continue to work on decreaseing spasming and improving posture and strength.    Personal Factors and Comorbidities  Comorbidity 1;Comorbidity 2;Profession    Comorbidities  history of cervical spine pain, TMJ    Examination-Activity Limitations  Lift;Reach Overhead;Locomotion Level    Examination-Participation Restrictions  Shop;Driving;Community Activity;Cleaning;Meal Prep    Stability/Clinical Decision Making  Evolving/Moderate complexity    Clinical Decision Making  Low    Rehab Potential  Good    PT Frequency  2x / week    PT Duration  6 weeks    PT Treatment/Interventions  ADLs/Self Care Home  Management;Cryotherapy;Electrical Stimulation;Iontophoresis 4mg /ml  Dexamethasone;Moist Heat;Ultrasound;Traction;DME Instruction;Gait training;Stair training;Functional mobility training;Therapeutic activities;Therapeutic exercise;Neuromuscular re-education;Patient/family education;Manual techniques;Passive range of motion;Dry needling;Taping    PT Next Visit Plan  revbiew HEP, review decomopression position, consider manuall therapy to neck and back, consider modalities to neck and back, work on relaxation and breathing techniques, monitor symptoms as it is still very early. Consider PROM of the left shoulder; consider shpards hook    PT Home Exercise Plan  leg lengthener, decompression with shoulder sink, cerivical rotation 3x in pain free range, AAROM cane; piriformis stretch, lower trunk rotation    Consulted and Agree with Plan of Care  Patient       Patient will benefit from skilled therapeutic intervention in order to improve the following deficits and impairments:  Pain, Increased muscle spasms, Decreased activity tolerance, Decreased strength, Decreased range of motion  Visit Diagnosis: Cervicalgia  Other muscle spasm  Acute pain of left shoulder  Stiffness of left shoulder, not elsewhere classified     Problem List Patient Active Problem List   Diagnosis Date Noted  . Protrusion of cervical intervertebral disc 04/10/2019  . Spinal stenosis of cervical region 04/10/2019  . Overactive bladder 11/19/2018  . Photosensitization due to sun 11/02/2018  . Generalized pain 11/02/2018  . High risk medication use 11/02/2018  . Foot pain, bilateral 05/10/2018  . Heel pain, bilateral 05/10/2018  . Plantar fasciitis 05/10/2018  . Left knee pain 05/10/2018  . Patellofemoral pain syndrome of both knees 05/10/2018  . Generalized abdominal pain 04/17/2018  . Flank pain 04/17/2018  . Acute back pain 04/17/2018  . History of renal stone 04/17/2018  . Urinary frequency 04/17/2018  .  Laceration of left hand 01/31/2018  . Decreased range of motion of finger of left hand 01/31/2018  . Localized swelling on left hand 01/31/2018  . Chronic migraine without aura without status migrainosus, not intractable 11/01/2016  . History of migraine 06/10/2016  . Diarrhea 05/20/2016  . Pain in the chest 05/11/2016  . Dizziness and giddiness 05/11/2016  . History of hypertension 05/11/2016  . Hemorrhoidal skin tag 04/07/2015  . IUD (intrauterine device) in place 05/03/2013  . Allergic rhinitis 01/11/2013  . Current smoker 01/11/2013  . Weight gain 06/26/2012  . Cervical stenosis (uterine cervix) 04/25/2012  . Anticardiolipin antibody positive   . Hyperlipidemia   . Hypertension   . History of trichomoniasis 01/18/2011    Class: History of  . THYROID NODULE, RIGHT 02/05/2008  . GERD 02/05/2008  . NECK PAIN 02/05/2008    Carney Living PT DPT  05/09/2019, 12:34 PM  [facility] [address] [place], [place], [address] Phone: [phone]   Fax:  [phone]  Name: Kelly [name] MRN: [id number] Date of Birth: Sep 18, 1965

## 2019-05-14 ENCOUNTER — Ambulatory Visit: Payer: 59 | Admitting: Physical Therapy

## 2019-05-15 ENCOUNTER — Encounter: Payer: Self-pay | Admitting: Gynecology

## 2019-05-16 ENCOUNTER — Other Ambulatory Visit: Payer: Self-pay

## 2019-05-16 ENCOUNTER — Ambulatory Visit: Payer: 59 | Admitting: Physical Therapy

## 2019-05-16 DIAGNOSIS — M542 Cervicalgia: Secondary | ICD-10-CM

## 2019-05-16 DIAGNOSIS — M25512 Pain in left shoulder: Secondary | ICD-10-CM

## 2019-05-16 DIAGNOSIS — M25612 Stiffness of left shoulder, not elsewhere classified: Secondary | ICD-10-CM

## 2019-05-16 DIAGNOSIS — M62838 Other muscle spasm: Secondary | ICD-10-CM | POA: Diagnosis not present

## 2019-05-16 DIAGNOSIS — M25562 Pain in left knee: Secondary | ICD-10-CM | POA: Diagnosis not present

## 2019-05-16 DIAGNOSIS — G8929 Other chronic pain: Secondary | ICD-10-CM | POA: Diagnosis not present

## 2019-05-16 NOTE — Therapy (Signed)
[place], [place], [address] Phone: [phone]   Fax:  [phone]  Physical Therapy Treatment  Patient Details  Name: Kelly [name] MRN: [id number] Date of Birth: 05-30-1966 Referring Provider (PT): Dr Joanne Chars   Encounter Date: 05/16/2019  PT End of Session - 05/16/19 0848    Visit Number  6    Number of Visits  12    Date for PT Re-Evaluation  06/05/19    Authorization Type  [employer]    PT Start Time  0846    PT Stop Time  0942    PT Time Calculation (min)  56 min    Activity Tolerance  Patient tolerated treatment well    Behavior During Therapy  [facility] for tasks assessed/performed       Past Medical History:  Diagnosis Date  . Anticardiolipin antibody positive   . HSV-2 (herpes simplex virus 2) infection   . Hyperlipidemia   . Hypertension    diet controlled  . Kidney stone   . Migraine   . Renal calculus, right   . Sleep apnea   . TMJ (dislocation of temporomandibular joint)     Past Surgical History:  Procedure Laterality Date  . CESAREAN SECTION     X3   LAST ONE 11-30-2004  . CYSTOSCOPY W/ RETROGRADES Right 10/29/2012   Procedure: CYSTOSCOPY WITH RETROGRADE PYELOGRAM and stent placement;  Surgeon: Reece Packer, MD;  Location: [facility];  Service: Urology;  Laterality: Right;  rt stent placement , rt retrograde and cysto   . CYSTOSCOPY W/ URETERAL STENT PLACEMENT Right 11/23/2012   Procedure: CYSTOSCOPY WITH STENT REPLACEMENT;  Surgeon: Alexis Frock, MD;  Location: [facility];  Service: Urology;  Laterality: Right;  . CYSTOSCOPY/RETROGRADE/URETEROSCOPY/STONE EXTRACTION WITH BASKET Right 11/23/2012   Procedure: CYSTOSCOPY/RETROGRADE/URETEROSCOPY/STONE EXTRACTION WITH BASKET;  Surgeon: Alexis Frock, MD;  Location: [facility];  Service: Urology;  Laterality: Right;    There were no vitals filed for this visit.  Subjective  Assessment - 05/16/19 1227    Subjective  Patient reports her pain has improved this morning but it is always better in the morning. As her day goes on her pain increases and as the week goes on her pain increases. She went to the beach for a few days and feels like the rest has helped. She did have some numbness in her leg at some point but it went away.    Pertinent History  TMJ, migranes,    Limitations  Other (comment)    How long can you stand comfortably?  increased lower back when standing    How long can you walk comfortably?  increased pain with ambualtion    Diagnostic tests  No x-rays in the chart.    Patient Stated Goals  to have less pain    Currently in Pain?  Yes    Pain Score  2     Pain Location  Neck    Pain Orientation  Left    Pain Descriptors / Indicators  Aching    Pain Type  Chronic pain    Pain Onset  More than a month ago    Pain Frequency  Constant    Aggravating Factors   movement of the head    Pain Relieving Factors  rest    Effect of Pain on Daily Activities  difficulty perfroming ADL's    Multiple Pain Sites  No  [name] PT Assessment - 05/16/19 0001      AROM   Cervical Flexion  27    Cervical Extension  25    Cervical - Right Rotation  55    Cervical - Left Rotation  50      Strength   Left Shoulder Flexion  4/5    Left Shoulder Internal Rotation  4/5    Left Shoulder External Rotation  4/5                   [facility] Adult PT Treatment/Exercise - 05/16/19 0001      Neck Exercises: Standing   Other Standing Exercises  scap retraction 2x10 yred; shoulder extension 2x10 red      Lumbar Exercises: Supine   Other Supine Lumbar Exercises  bilateral ER; bilateral horizontal abduction 2x10       Manual Therapy   Manual Therapy  Passive ROM    Soft tissue mobilization  trigger point release to cervical parapinals, upper trap , IASTYM to upper trap and shoulder.     Manual Traction  gentle cervical traction; sub-occipital release               PT Education - 05/16/19 1234    Education Details  reviewed porgression on HEP    Person(s) Educated  Patient    Methods  Explanation;Demonstration;Tactile cues;Verbal cues    Comprehension  Verbalized understanding;Returned demonstration;Verbal cues required;Tactile cues required;Need further instruction       PT Short Term Goals - 05/16/19 0954      PT SHORT TERM GOAL #1   Title  Patient will be indepdent with relaxation and breathing techniques to reduce actue inflamtion    Baseline  reviewed decompression position    Time  3    Period  Weeks    Status  On-going    Target Date  04/02/19      PT SHORT TERM GOAL #2   Title  Patient will report no radiating pain down her left arm    Baseline  intermitent depending on activity    Time  3    Period  Weeks    Status  On-going    Target Date  07/10/18      PT SHORT TERM GOAL #3   Title  Patient will report 3/10 pain at worst after work    Time  3    Period  Weeks    Status  On-going    Target Date  05/10/19        PT Long Term Goals - 04/19/19 0751      PT LONG TERM GOAL #1   Title  Patient will demonstrate 70 degrees of passive rotation ROM bilateral without pain    Baseline  initial measure 0 degrees    Time  6    Period  Weeks    Status  On-going      PT LONG TERM GOAL #2   Title  Patient will sit at her desk for 4 hours without pain    Time  6    Period  Weeks    Status  New    Target Date  05/31/19      PT LONG TERM GOAL #3   Title  Patient will ambulate without lower back pain in order to perfrom ADL's    Time  6    Period  Weeks    Status  On-going  Plan - 05/16/19 0903    Clinical Impression Statement  Therapy trialed taping today. She was advised of adverese reaction to adhesive as well as donning and doffing. She had no complaints with taping. Therapy also continues to work on postural correction below 90 degrees. She has no pain with postural exercises.. Overall  her rnage and her UE strength has improved. She has radicular symptoms that come and go but do not last. Her pain levels are better in the morning and worse as the day goes on so there is a postural component to her pain. Overall her pain is no worse. Her pain levels may be a little better and less consitent. Therapy will continue per MD reccomendation.    Personal Factors and Comorbidities  Comorbidity 1;Comorbidity 2;Profession    Comorbidities  history of cervical spine pain, TMJ    Examination-Activity Limitations  Lift;Reach Overhead;Locomotion Level    Examination-Participation Restrictions  Shop;Driving;Community Activity;Cleaning;Meal Prep    Stability/Clinical Decision Making  Evolving/Moderate complexity    Clinical Decision Making  Low    Rehab Potential  Good    PT Frequency  2x / week    PT Duration  6 weeks    PT Treatment/Interventions  ADLs/Self Care Home Management;Cryotherapy;Electrical Stimulation;Iontophoresis 4mg /ml Dexamethasone;Moist Heat;Ultrasound;Traction;DME Instruction;Gait training;Stair training;Functional mobility training;Therapeutic activities;Therapeutic exercise;Neuromuscular re-education;Patient/family education;Manual techniques;Passive range of motion;Dry needling;Taping    PT Next Visit Plan  revbiew HEP, review decomopression position, consider manuall therapy to neck and back, consider modalities to neck and back, work on relaxation and breathing techniques, monitor symptoms as it is still very early. Consider PROM of the left shoulder; consider shpards hook    PT Home Exercise Plan  leg lengthener, decompression with shoulder sink, cerivical rotation 3x in pain free range, AAROM cane; piriformis stretch, lower trunk rotation    Consulted and Agree with Plan of Care  Patient       Patient will benefit from skilled therapeutic intervention in order to improve the following deficits and impairments:     Visit Diagnosis: Cervicalgia  Other muscle  spasm  Acute pain of left shoulder  Stiffness of left shoulder, not elsewhere classified     Problem List Patient Active Problem List   Diagnosis Date Noted  . Protrusion of cervical intervertebral disc 04/10/2019  . Spinal stenosis of cervical region 04/10/2019  . Overactive bladder 11/19/2018  . Photosensitization due to sun 11/02/2018  . Generalized pain 11/02/2018  . High risk medication use 11/02/2018  . Foot pain, bilateral 05/10/2018  . Heel pain, bilateral 05/10/2018  . Plantar fasciitis 05/10/2018  . Left knee pain 05/10/2018  . Patellofemoral pain syndrome of both knees 05/10/2018  . Generalized abdominal pain 04/17/2018  . Flank pain 04/17/2018  . Acute back pain 04/17/2018  . History of renal stone 04/17/2018  . Urinary frequency 04/17/2018  . Laceration of left hand 01/31/2018  . Decreased range of motion of finger of left hand 01/31/2018  . Localized swelling on left hand 01/31/2018  . Chronic migraine without aura without status migrainosus, not intractable 11/01/2016  . History of migraine 06/10/2016  . Diarrhea 05/20/2016  . Pain in the chest 05/11/2016  . Dizziness and giddiness 05/11/2016  . History of hypertension 05/11/2016  . Hemorrhoidal skin tag 04/07/2015  . IUD (intrauterine device) in place 05/03/2013  . Allergic rhinitis 01/11/2013  . Current smoker 01/11/2013  . Weight gain 06/26/2012  . Cervical stenosis (uterine cervix) 04/25/2012  . Anticardiolipin antibody positive   . Hyperlipidemia   .  Hypertension   . History of trichomoniasis 01/18/2011    Class: History of  . THYROID NODULE, RIGHT 02/05/2008  . GERD 02/05/2008  . NECK PAIN 02/05/2008    Carney Living PT DPT  05/16/2019, 1:22 PM  [facility] [address] [place], [place], [address] Phone: [phone]   Fax:  [phone]  Name: KESHIA HOEK MRN: [id number] Date of Birth: 01/21/1966

## 2019-05-20 ENCOUNTER — Other Ambulatory Visit: Payer: Self-pay | Admitting: Family Medicine

## 2019-05-20 DIAGNOSIS — G959 Disease of spinal cord, unspecified: Secondary | ICD-10-CM | POA: Diagnosis not present

## 2019-05-20 MED ORDER — HYDROCODONE-ACETAMINOPHEN 5-325 MG PO TABS: 1.0000 | ORAL_TABLET | Freq: Every evening | ORAL | 0 refills | Status: DC | PRN

## 2019-05-20 MED FILL — METHOCARBAMOL 500 MG TABS: 500 | 15 days supply | Qty: 60 | Fill #0

## 2019-05-20 MED FILL — CYCLOBENZAPRINE 5 MG TABLET: 5 | 30 days supply | Qty: 60 | Fill #0

## 2019-05-20 MED FILL — traMADol HCL 50 MG TABS: 50 | 7 days supply | Qty: 50 | Fill #0

## 2019-05-20 MED FILL — HYDROCODON-APAP 5-325: 5-325 | 10 days supply | Qty: 20 | Fill #0

## 2019-05-20 NOTE — Telephone Encounter (Signed)
Please advise 

## 2019-05-20 NOTE — Telephone Encounter (Signed)
Dr. Hilts patient. 

## 2019-05-21 ENCOUNTER — Other Ambulatory Visit: Payer: Self-pay

## 2019-05-21 ENCOUNTER — Other Ambulatory Visit: Payer: Self-pay | Admitting: Orthopedic Surgery

## 2019-05-21 ENCOUNTER — Ambulatory Visit: Payer: 59 | Admitting: Physical Therapy

## 2019-05-21 ENCOUNTER — Other Ambulatory Visit (HOSPITAL_COMMUNITY): Payer: Self-pay | Admitting: Orthopedic Surgery

## 2019-05-21 DIAGNOSIS — M25612 Stiffness of left shoulder, not elsewhere classified: Secondary | ICD-10-CM

## 2019-05-21 DIAGNOSIS — G959 Disease of spinal cord, unspecified: Secondary | ICD-10-CM

## 2019-05-21 DIAGNOSIS — M62838 Other muscle spasm: Secondary | ICD-10-CM

## 2019-05-21 DIAGNOSIS — G8929 Other chronic pain: Secondary | ICD-10-CM

## 2019-05-21 DIAGNOSIS — M542 Cervicalgia: Secondary | ICD-10-CM

## 2019-05-21 DIAGNOSIS — M25562 Pain in left knee: Secondary | ICD-10-CM | POA: Diagnosis not present

## 2019-05-21 DIAGNOSIS — M25512 Pain in left shoulder: Secondary | ICD-10-CM | POA: Diagnosis not present

## 2019-05-22 ENCOUNTER — Other Ambulatory Visit: Payer: Self-pay | Admitting: Obstetrics & Gynecology

## 2019-05-22 ENCOUNTER — Encounter: Payer: Self-pay | Admitting: Physical Therapy

## 2019-05-22 NOTE — Therapy (Signed)
[place], [place], [address] Phone: [phone]   Fax:  [phone]  Physical Therapy Treatment  Patient Details  Name: Kelly [name] MRN: [id number] Date of Birth: 1965/12/18 Referring Provider (PT): Dr Joanne Chars   Encounter Date: 05/21/2019  PT End of Session - 05/22/19 1333    Visit Number  7    Number of Visits  12    Date for PT Re-Evaluation  06/05/19    Authorization Type  [employer]    PT Start Time  1545    PT Stop Time  1636    PT Time Calculation (min)  51 min    Activity Tolerance  Patient tolerated treatment well    Behavior During Therapy  [facility] for tasks assessed/performed       Past Medical History:  Diagnosis Date  . Anticardiolipin antibody positive   . HSV-2 (herpes simplex virus 2) infection   . Hyperlipidemia   . Hypertension    diet controlled  . Kidney stone   . Migraine   . Renal calculus, right   . Sleep apnea   . TMJ (dislocation of temporomandibular joint)     Past Surgical History:  Procedure Laterality Date  . CESAREAN SECTION     X3   LAST ONE 11-30-2004  . CYSTOSCOPY W/ RETROGRADES Right 10/29/2012   Procedure: CYSTOSCOPY WITH RETROGRADE PYELOGRAM and stent placement;  Surgeon: Reece Packer, MD;  Location: [facility];  Service: Urology;  Laterality: Right;  rt stent placement , rt retrograde and cysto   . CYSTOSCOPY W/ URETERAL STENT PLACEMENT Right 11/23/2012   Procedure: CYSTOSCOPY WITH STENT REPLACEMENT;  Surgeon: Alexis Frock, MD;  Location: [facility];  Service: Urology;  Laterality: Right;  . CYSTOSCOPY/RETROGRADE/URETEROSCOPY/STONE EXTRACTION WITH BASKET Right 11/23/2012   Procedure: CYSTOSCOPY/RETROGRADE/URETEROSCOPY/STONE EXTRACTION WITH BASKET;  Surgeon: Alexis Frock, MD;  Location: [facility];  Service: Urology;  Laterality: Right;    There were no vitals filed for this visit.  Subjective  Assessment - 05/22/19 1330    Subjective  Patient has been to the MD. She will have a repeat MRI. If things have advanced she will have surgery. If they have improved she will not. Her pain is sa little worse today but overall she is doing about the same. She has had no incedences of arm numbness or leg numbness.    Pertinent History  TMJ, migranes,    Limitations  Other (comment)    How long can you stand comfortably?  increased lower back when standing    How long can you walk comfortably?  increased pain with ambualtion    Diagnostic tests  No x-rays in the chart.    Patient Stated Goals  to have less pain    Currently in Pain?  Yes    Pain Score  3     Pain Location  Neck    Pain Orientation  Right    Pain Descriptors / Indicators  Aching    Pain Type  Chronic pain    Pain Radiating Towards  nothing over the past week    Pain Onset  More than a month ago    Pain Frequency  Constant    Aggravating Factors   movement of the head    Pain Relieving Factors  rest    Effect of Pain on Daily Activities  difficulty perfroming ADL's  [facility] Adult PT Treatment/Exercise - 05/22/19 0001      Neck Exercises: Standing   Other Standing Exercises  scap retraction 2x10 yred; shoulder extension 2x10 red      Neck Exercises: Supine   Other Supine Exercise  bilateral ER yellow 2x10; horizontal abduction 10 yellow; shoulder flexion to 90 with yellow band 2x10       Electrical Stimulation   Electrical Stimulation Location  cervical spine     Electrical Stimulation Action  IFC    Electrical Stimulation Parameters  to tolerance     Electrical Stimulation Goals  Pain      Manual Therapy   Manual Therapy  Passive ROM    Soft tissue mobilization  trigger point release to cervical parapinals, upper trap , IASTYM to upper trap and shoulder.     Manual Traction  gentle cervical traction; sub-occipital release              PT Education - 05/22/19 1332     Education Details  exercises progression    Person(s) Educated  Patient    Methods  Explanation;Demonstration;Other (comment)    Comprehension  Verbalized understanding;Returned demonstration;Verbal cues required;Tactile cues required       PT Short Term Goals - 05/16/19 0954      PT SHORT TERM GOAL #1   Title  Patient will be indepdent with relaxation and breathing techniques to reduce actue inflamtion    Baseline  reviewed decompression position    Time  3    Period  Weeks    Status  On-going    Target Date  04/02/19      PT SHORT TERM GOAL #2   Title  Patient will report no radiating pain down her left arm    Baseline  intermitent depending on activity    Time  3    Period  Weeks    Status  On-going    Target Date  07/10/18      PT SHORT TERM GOAL #3   Title  Patient will report 3/10 pain at worst after work    Time  3    Period  Weeks    Status  On-going    Target Date  05/10/19        PT Long Term Goals - 04/19/19 0751      PT LONG TERM GOAL #1   Title  Patient will demonstrate 70 degrees of passive rotation ROM bilateral without pain    Baseline  initial measure 0 degrees    Time  6    Period  Weeks    Status  On-going      PT LONG TERM GOAL #2   Title  Patient will sit at her desk for 4 hours without pain    Time  6    Period  Weeks    Status  New    Target Date  05/31/19      PT LONG TERM GOAL #3   Title  Patient will ambulate without lower back pain in order to perfrom ADL's    Time  6    Period  Weeks    Status  On-going            Plan - 05/22/19 1333    Clinical Impression Statement  Therapy will continue to work on manual therapy and progressive strengthening. She had no increase in pain today after treatment. Therapy added supine exercises back in. She was advised she can perfrom exercises at home.  Personal Factors and Comorbidities  Comorbidity 1;Comorbidity 2;Profession    Comorbidities  history of cervical spine pain, TMJ     Examination-Activity Limitations  Lift;Reach Overhead;Locomotion Level    Examination-Participation Restrictions  Shop;Driving;Community Activity;Cleaning;Meal Prep    Stability/Clinical Decision Making  Evolving/Moderate complexity    Clinical Decision Making  Low    Rehab Potential  Good    PT Frequency  2x / week    PT Duration  6 weeks    PT Treatment/Interventions  ADLs/Self Care Home Management;Cryotherapy;Electrical Stimulation;Iontophoresis 4mg /ml Dexamethasone;Moist Heat;Ultrasound;Traction;DME Instruction;Gait training;Stair training;Functional mobility training;Therapeutic activities;Therapeutic exercise;Neuromuscular re-education;Patient/family education;Manual techniques;Passive range of motion;Dry needling;Taping    PT Next Visit Plan  continue to progress exercises as tolerated. continue with manual therapy. Continue with manual traction. Did not respond well to mechanical traction.    PT Home Exercise Plan  leg lengthener, decompression with shoulder sink, cerivical rotation 3x in pain free range, AAROM cane; piriformis stretch, lower trunk rotation    Consulted and Agree with Plan of Care  Patient       Patient will benefit from skilled therapeutic intervention in order to improve the following deficits and impairments:  Pain, Increased muscle spasms, Decreased activity tolerance, Decreased strength, Decreased range of motion  Visit Diagnosis: Cervicalgia  Other muscle spasm  Acute pain of left shoulder  Stiffness of left shoulder, not elsewhere classified  Chronic pain of left knee     Problem List Patient Active Problem List   Diagnosis Date Noted  . Protrusion of cervical intervertebral disc 04/10/2019  . Spinal stenosis of cervical region 04/10/2019  . Overactive bladder 11/19/2018  . Photosensitization due to sun 11/02/2018  . Generalized pain 11/02/2018  . High risk medication use 11/02/2018  . Foot pain, bilateral 05/10/2018  . Heel pain, bilateral  05/10/2018  . Plantar fasciitis 05/10/2018  . Left knee pain 05/10/2018  . Patellofemoral pain syndrome of both knees 05/10/2018  . Generalized abdominal pain 04/17/2018  . Flank pain 04/17/2018  . Acute back pain 04/17/2018  . History of renal stone 04/17/2018  . Urinary frequency 04/17/2018  . Laceration of left hand 01/31/2018  . Decreased range of motion of finger of left hand 01/31/2018  . Localized swelling on left hand 01/31/2018  . Chronic migraine without aura without status migrainosus, not intractable 11/01/2016  . History of migraine 06/10/2016  . Diarrhea 05/20/2016  . Pain in the chest 05/11/2016  . Dizziness and giddiness 05/11/2016  . History of hypertension 05/11/2016  . Hemorrhoidal skin tag 04/07/2015  . IUD (intrauterine device) in place 05/03/2013  . Allergic rhinitis 01/11/2013  . Current smoker 01/11/2013  . Weight gain 06/26/2012  . Cervical stenosis (uterine cervix) 04/25/2012  . Anticardiolipin antibody positive   . Hyperlipidemia   . Hypertension   . History of trichomoniasis 01/18/2011    Class: History of  . THYROID NODULE, RIGHT 02/05/2008  . GERD 02/05/2008  . NECK PAIN 02/05/2008    Kelly [name] PT DPT  05/22/2019, 1:43 PM  [facility] [address] [place], [place], [address] Phone: [phone]   Fax:  [phone]  Name: Kelly [name] MRN: [id number] Date of Birth: [birth year]

## 2019-05-23 ENCOUNTER — Ambulatory Visit: Payer: 59 | Attending: Family Medicine | Admitting: Physical Therapy

## 2019-05-23 ENCOUNTER — Encounter: Payer: Self-pay | Admitting: Physical Therapy

## 2019-05-23 ENCOUNTER — Other Ambulatory Visit: Payer: Self-pay

## 2019-05-23 DIAGNOSIS — M542 Cervicalgia: Secondary | ICD-10-CM | POA: Diagnosis not present

## 2019-05-23 DIAGNOSIS — M25612 Stiffness of left shoulder, not elsewhere classified: Secondary | ICD-10-CM | POA: Diagnosis not present

## 2019-05-23 DIAGNOSIS — M25512 Pain in left shoulder: Secondary | ICD-10-CM | POA: Diagnosis not present

## 2019-05-23 DIAGNOSIS — M62838 Other muscle spasm: Secondary | ICD-10-CM | POA: Insufficient documentation

## 2019-05-23 MED FILL — LIDOCAINE 5 % CRM: 5 | 15 days supply | Qty: 30 | Fill #0

## 2019-05-24 ENCOUNTER — Encounter: Payer: Self-pay | Admitting: Physical Therapy

## 2019-05-24 NOTE — Therapy (Signed)
[place], [place], [address] Phone: [phone]   Fax:  [phone]  Physical Therapy Treatment  Patient Details  Name: Kelly [name] MRN: [id number] Date of Birth: [birth year] Referring Provider (PT): Dr Joanne Chars   Encounter Date: 05/23/2019  PT End of Session - 05/23/19 1445    Visit Number  8    Number of Visits  12    Date for PT Re-Evaluation  06/05/19    Authorization Type  [facility] [employer]    PT Start Time  [id number]    PT Stop Time  1500    PT Time Calculation (min)  43 min    Activity Tolerance  Patient tolerated treatment well    Behavior During Therapy  [facility] for tasks assessed/performed       Past Medical History:  Diagnosis Date  . Anticardiolipin antibody positive   . HSV-2 (herpes simplex virus 2) infection   . Hyperlipidemia   . Hypertension    diet controlled  . Kidney stone   . Migraine   . Renal calculus, right   . Sleep apnea   . TMJ (dislocation of temporomandibular joint)     Past Surgical History:  Procedure Laterality Date  . CESAREAN SECTION     X3   LAST ONE 11-30-2004  . CYSTOSCOPY W/ RETROGRADES Right 10/29/2012   Procedure: CYSTOSCOPY WITH RETROGRADE PYELOGRAM and stent placement;  Surgeon: Reece Packer, MD;  Location: [facility];  Service: Urology;  Laterality: Right;  rt stent placement , rt retrograde and cysto   . CYSTOSCOPY W/ URETERAL STENT PLACEMENT Right 11/23/2012   Procedure: CYSTOSCOPY WITH STENT REPLACEMENT;  Surgeon: Alexis Frock, MD;  Location: [facility];  Service: Urology;  Laterality: Right;  . CYSTOSCOPY/RETROGRADE/URETEROSCOPY/STONE EXTRACTION WITH BASKET Right 11/23/2012   Procedure: CYSTOSCOPY/RETROGRADE/URETEROSCOPY/STONE EXTRACTION WITH BASKET;  Surgeon: Alexis Frock, MD;  Location: [facility];  Service: Urology;  Laterality: Right;    There were no vitals filed for this visit.  Subjective  Assessment - 05/23/19 1425    Subjective  Patient has no significant complaints today. Her pain level is about a 4/10. She reports over the past few days she has had more of a catching pain in her neck. She has not had any increase in radicular pain though.    Pertinent History  TMJ, migranes,    Limitations  Other (comment)    Currently in Pain?  Yes    Pain Score  3     Pain Location  Neck    Pain Orientation  Right;Left    Pain Descriptors / Indicators  Aching    Pain Type  Chronic pain    Pain Radiating Towards  nothing for 10 days    Pain Onset  More than a month ago    Pain Frequency  Constant    Aggravating Factors   movement of the head    Pain Relieving Factors  rest    Effect of Pain on Daily Activities  difficulty perfromig ADL's                       [facility] Adult PT Treatment/Exercise - 05/24/19 0001      Neck Exercises: Machines for Strengthening   UBE (Upper Arm Bike)  2 min backwards       Neck Exercises: Standing   Other Standing Exercises  scap retraction 2x10 green ; shoulder extension 2x10 green  Neck Exercises: Supine   Other Supine Exercise  bilateral ER red 2x10; horizontal abduction 10 red;        Electrical Stimulation   Electrical Stimulation Location  cervical spine     Electrical Stimulation Goals  Pain      Manual Therapy   Manual Therapy  Passive ROM    Soft tissue mobilization  trigger point release to cervical parapinals, upper trap , IASTYM to upper trap and shoulder.     Manual Traction  gentle cervical traction; sub-occipital release              PT Education - 05/23/19 1445    Education Details  updated HEP    Person(s) Educated  Patient    Methods  Demonstration;Tactile cues;Explanation;Verbal cues    Comprehension  Verbalized understanding;Returned demonstration;Verbal cues required;Tactile cues required       PT Short Term Goals - 05/16/19 0954      PT SHORT TERM GOAL #1   Title  Patient will be  indepdent with relaxation and breathing techniques to reduce actue inflamtion    Baseline  reviewed decompression position    Time  3    Period  Weeks    Status  On-going    Target Date  04/02/19      PT SHORT TERM GOAL #2   Title  Patient will report no radiating pain down her left arm    Baseline  intermitent depending on activity    Time  3    Period  Weeks    Status  On-going    Target Date  07/10/18      PT SHORT TERM GOAL #3   Title  Patient will report 3/10 pain at worst after work    Time  3    Period  Weeks    Status  On-going    Target Date  05/10/19        PT Long Term Goals - 04/19/19 0751      PT LONG TERM GOAL #1   Title  Patient will demonstrate 70 degrees of passive rotation ROM bilateral without pain    Baseline  initial measure 0 degrees    Time  6    Period  Weeks    Status  On-going      PT LONG TERM GOAL #2   Title  Patient will sit at her desk for 4 hours without pain    Time  6    Period  Weeks    Status  New    Target Date  05/31/19      PT LONG TERM GOAL #3   Title  Patient will ambulate without lower back pain in order to perfrom ADL's    Time  6    Period  Weeks    Status  On-going            Plan - 05/23/19 1446    Clinical Impression Statement  Therapy advanced patients postural exercises. She had no increase in pain. She continues to make some progress. She feels lrelief in pain with manual traction Therapy will continue to advance as tolerated.    Personal Factors and Comorbidities  Comorbidity 1;Comorbidity 2;Profession    Comorbidities  history of cervical spine pain, TMJ    Examination-Activity Limitations  Lift;Reach Overhead;Locomotion Level    Examination-Participation Restrictions  Shop;Driving;Community Activity;Cleaning;Meal Prep    Stability/Clinical Decision Making  Evolving/Moderate complexity    Clinical Decision Making  Low    Rehab  Potential  Good    PT Frequency  2x / week    PT Duration  6 weeks    PT  Treatment/Interventions  ADLs/Self Care Home Management;Cryotherapy;Electrical Stimulation;Iontophoresis 4mg /ml Dexamethasone;Moist Heat;Ultrasound;Traction;DME Instruction;Gait training;Stair training;Functional mobility training;Therapeutic activities;Therapeutic exercise;Neuromuscular re-education;Patient/family education;Manual techniques;Passive range of motion;Dry needling;Taping    PT Next Visit Plan  continue to progress exercises as tolerated. continue with manual therapy. Continue with manual traction. Did not respond well to mechanical traction.    PT Home Exercise Plan  leg lengthener, decompression with shoulder sink, cerivical rotation 3x in pain free range, AAROM cane; piriformis stretch, lower trunk rotation    Consulted and Agree with Plan of Care  Patient       Patient will benefit from skilled therapeutic intervention in order to improve the following deficits and impairments:  Pain, Increased muscle spasms, Decreased activity tolerance, Decreased strength, Decreased range of motion  Visit Diagnosis: Cervicalgia  Other muscle spasm  Acute pain of left shoulder     Problem List Patient Active Problem List   Diagnosis Date Noted  . Protrusion of cervical intervertebral disc 04/10/2019  . Spinal stenosis of cervical region 04/10/2019  . Overactive bladder 11/19/2018  . Photosensitization due to sun 11/02/2018  . Generalized pain 11/02/2018  . High risk medication use 11/02/2018  . Foot pain, bilateral 05/10/2018  . Heel pain, bilateral 05/10/2018  . Plantar fasciitis 05/10/2018  . Left knee pain 05/10/2018  . Patellofemoral pain syndrome of both knees 05/10/2018  . Generalized abdominal pain 04/17/2018  . Flank pain 04/17/2018  . Acute back pain 04/17/2018  . History of renal stone 04/17/2018  . Urinary frequency 04/17/2018  . Laceration of left hand 01/31/2018  . Decreased range of motion of finger of left hand 01/31/2018  . Localized swelling on left hand  01/31/2018  . Chronic migraine without aura without status migrainosus, not intractable 11/01/2016  . History of migraine 06/10/2016  . Diarrhea 05/20/2016  . Pain in the chest 05/11/2016  . Dizziness and giddiness 05/11/2016  . History of hypertension 05/11/2016  . Hemorrhoidal skin tag 04/07/2015  . IUD (intrauterine device) in place 05/03/2013  . Allergic rhinitis 01/11/2013  . Current smoker 01/11/2013  . Weight gain 06/26/2012  . Cervical stenosis (uterine cervix) 04/25/2012  . Anticardiolipin antibody positive   . Hyperlipidemia   . Hypertension   . History of trichomoniasis 01/18/2011    Class: History of  . THYROID NODULE, RIGHT 02/05/2008  . GERD 02/05/2008  . NECK PAIN 02/05/2008    Carney Living PT DPT  05/24/2019, 8:32 AM  [facility] [address] [place], [place], [address] Phone: [phone]   Fax:  [phone]  Name: Kelly [name] MRN: [id number] Date of Birth: Dec 18, 1965

## 2019-05-27 ENCOUNTER — Other Ambulatory Visit: Payer: Self-pay

## 2019-05-27 ENCOUNTER — Ambulatory Visit: Payer: 59 | Admitting: Physical Therapy

## 2019-05-27 ENCOUNTER — Encounter: Payer: Self-pay | Admitting: Physical Therapy

## 2019-05-27 DIAGNOSIS — M25612 Stiffness of left shoulder, not elsewhere classified: Secondary | ICD-10-CM

## 2019-05-27 DIAGNOSIS — M25512 Pain in left shoulder: Secondary | ICD-10-CM | POA: Diagnosis not present

## 2019-05-27 DIAGNOSIS — M62838 Other muscle spasm: Secondary | ICD-10-CM

## 2019-05-27 DIAGNOSIS — M542 Cervicalgia: Secondary | ICD-10-CM | POA: Diagnosis not present

## 2019-05-28 ENCOUNTER — Encounter: Payer: Self-pay | Admitting: Physical Therapy

## 2019-05-28 NOTE — Therapy (Signed)
[place], [place], [address] Phone: [phone]   Fax:  [phone]  Physical Therapy Treatment  Patient Details  Name: Kelly [name] MRN: [id number] Date of Birth: 10/10/1965 Referring Provider (PT): Dr Joanne Chars   Encounter Date: 05/27/2019  PT End of Session - 05/27/19 1728    Visit Number  9    Number of Visits  12    Date for PT Re-Evaluation  06/05/19    Authorization Type  [facility]    PT Start Time  1630    PT Stop Time  1714    PT Time Calculation (min)  44 min    Activity Tolerance  Patient tolerated treatment well    Behavior During Therapy  [facility] for tasks assessed/performed       Past Medical History:  Diagnosis Date  . Anticardiolipin antibody positive   . HSV-2 (herpes simplex virus 2) infection   . Hyperlipidemia   . Hypertension    diet controlled  . Kidney stone   . Migraine   . Renal calculus, right   . Sleep apnea   . TMJ (dislocation of temporomandibular joint)     Past Surgical History:  Procedure Laterality Date  . CESAREAN SECTION     X3   LAST ONE 11-30-2004  . CYSTOSCOPY W/ RETROGRADES Right 10/29/2012   Procedure: CYSTOSCOPY WITH RETROGRADE PYELOGRAM and stent placement;  Surgeon: Reece Packer, MD;  Location: [facility];  Service: Urology;  Laterality: Right;  rt stent placement , rt retrograde and cysto   . CYSTOSCOPY W/ URETERAL STENT PLACEMENT Right 11/23/2012   Procedure: CYSTOSCOPY WITH STENT REPLACEMENT;  Surgeon: Alexis Frock, MD;  Location: [facility];  Service: Urology;  Laterality: Right;  . CYSTOSCOPY/RETROGRADE/URETEROSCOPY/STONE EXTRACTION WITH BASKET Right 11/23/2012   Procedure: CYSTOSCOPY/RETROGRADE/URETEROSCOPY/STONE EXTRACTION WITH BASKET;  Surgeon: Alexis Frock, MD;  Location: [facility];  Service: Urology;  Laterality: Right;    There were no vitals filed for this visit.  Subjective  Assessment - 05/27/19 1637    Subjective  Patient reports she was alittle more sore this weekend. She did some house work. She alsways feels a little more sore asa the week goes on.    Pertinent History  TMJ, migranes,    Limitations  Other (comment)    How long can you stand comfortably?  increased lower back when standing    How long can you walk comfortably?  increased pain with ambualtion    Diagnostic tests  No x-rays in the chart.    Patient Stated Goals  to have less pain    Currently in Pain?  Yes    Pain Score  3     Pain Location  Neck    Pain Orientation  Right;Left    Pain Descriptors / Indicators  Aching    Pain Type  Chronic pain    Pain Radiating Towards  no radiating pain    Pain Onset  More than a month ago    Pain Frequency  Constant    Aggravating Factors   movement of the head    Pain Relieving Factors  rest    Effect of Pain on Daily Activities  difficulty perfroming ADL's                       [facility] Adult PT Treatment/Exercise - 05/28/19 0001      Neck Exercises: Machines for Strengthening  UBE (Upper Arm Bike)  2 min backwards       Neck Exercises: Standing   Other Standing Exercises  scap retraction 2x10 green ; shoulder extension 2x10 green       Neck Exercises: Supine   Other Supine Exercise  bilateral ER red 2x10; horizontal abduction 10 red;        Electrical Stimulation   Electrical Stimulation Location  cervical spine     Electrical Stimulation Action  IFC    Electrical Stimulation Parameters  to tolerance     Electrical Stimulation Goals  Pain      Manual Therapy   Manual Therapy  Passive ROM    Soft tissue mobilization  trigger point release to cervical parapinals, upper trap , IASTYM to upper trap and shoulder.     Manual Traction  gentle cervical traction; sub-occipital release              PT Education - 05/27/19 1640    Education Details  reviewed HEP and symptom managment    Person(s) Educated  Patient     Methods  Explanation;Demonstration;Tactile cues;Verbal cues    Comprehension  Verbalized understanding;Verbal cues required;Tactile cues required;Returned demonstration       PT Short Term Goals - 05/28/19 0806      PT SHORT TERM GOAL #1   Title  Patient will be indepdent with relaxation and breathing techniques to reduce actue inflamtion    Baseline  reviewed decompression position    Time  3    Period  Weeks    Status  On-going    Target Date  04/02/19      PT SHORT TERM GOAL #2   Title  Patient will report no radiating pain down her left arm    Baseline  intermitent depending on activity    Time  3    Period  Weeks    Status  On-going    Target Date  07/10/18      PT SHORT TERM GOAL #3   Title  Patient will report 3/10 pain at worst after work    Time  3    Period  Weeks    Target Date  05/10/19        PT Long Term Goals - 04/19/19 0751      PT LONG TERM GOAL #1   Title  Patient will demonstrate 70 degrees of passive rotation ROM bilateral without pain    Baseline  initial measure 0 degrees    Time  6    Period  Weeks    Status  On-going      PT LONG TERM GOAL #2   Title  Patient will sit at her desk for 4 hours without pain    Time  6    Period  Weeks    Status  New    Target Date  05/31/19      PT LONG TERM GOAL #3   Title  Patient will ambulate without lower back pain in order to perfrom ADL's    Time  6    Period  Weeks    Status  On-going            Plan - 05/27/19 1731    Clinical Impression Statement  Patient continues to tolerate treatment well. She had no increase in pain with exercises. Therapy will continue to progresss as tolerated.    Personal Factors and Comorbidities  Comorbidity 1;Comorbidity 2;Profession    Comorbidities  history of cervical  spine pain, TMJ    Examination-Activity Limitations  Lift;Reach Overhead;Locomotion Level    Stability/Clinical Decision Making  Evolving/Moderate complexity    Clinical Decision Making  Low     Rehab Potential  Good    PT Frequency  2x / week    PT Duration  6 weeks    PT Treatment/Interventions  ADLs/Self Care Home Management;Cryotherapy;Electrical Stimulation;Iontophoresis 4mg /ml Dexamethasone;Moist Heat;Ultrasound;Traction;DME Instruction;Gait training;Stair training;Functional mobility training;Therapeutic activities;Therapeutic exercise;Neuromuscular re-education;Patient/family education;Manual techniques;Passive range of motion;Dry needling;Taping    PT Next Visit Plan  continue to progress exercises as tolerated. continue with manual therapy. Continue with manual traction. Did not respond well to mechanical traction.    PT Home Exercise Plan  leg lengthener, decompression with shoulder sink, cerivical rotation 3x in pain free range, AAROM cane; piriformis stretch, lower trunk rotation    Consulted and Agree with Plan of Care  Patient       Patient will benefit from skilled therapeutic intervention in order to improve the following deficits and impairments:  Pain, Increased muscle spasms, Decreased activity tolerance, Decreased strength, Decreased range of motion  Visit Diagnosis: Cervicalgia  Other muscle spasm  Acute pain of left shoulder  Stiffness of left shoulder, not elsewhere classified     Problem List Patient Active Problem List   Diagnosis Date Noted  . Protrusion of cervical intervertebral disc 04/10/2019  . Spinal stenosis of cervical region 04/10/2019  . Overactive bladder 11/19/2018  . Photosensitization due to sun 11/02/2018  . Generalized pain 11/02/2018  . High risk medication use 11/02/2018  . Foot pain, bilateral 05/10/2018  . Heel pain, bilateral 05/10/2018  . Plantar fasciitis 05/10/2018  . Left knee pain 05/10/2018  . Patellofemoral pain syndrome of both knees 05/10/2018  . Generalized abdominal pain 04/17/2018  . Flank pain 04/17/2018  . Acute back pain 04/17/2018  . History of renal stone 04/17/2018  . Urinary frequency 04/17/2018   . Laceration of left hand 01/31/2018  . Decreased range of motion of finger of left hand 01/31/2018  . Localized swelling on left hand 01/31/2018  . Chronic migraine without aura without status migrainosus, not intractable 11/01/2016  . History of migraine 06/10/2016  . Diarrhea 05/20/2016  . Pain in the chest 05/11/2016  . Dizziness and giddiness 05/11/2016  . History of hypertension 05/11/2016  . Hemorrhoidal skin tag 04/07/2015  . IUD (intrauterine device) in place 05/03/2013  . Allergic rhinitis 01/11/2013  . Current smoker 01/11/2013  . Weight gain 06/26/2012  . Cervical stenosis (uterine cervix) 04/25/2012  . Anticardiolipin antibody positive   . Hyperlipidemia   . Hypertension   . History of trichomoniasis 01/18/2011    Class: History of  . THYROID NODULE, RIGHT 02/05/2008  . GERD 02/05/2008  . NECK PAIN 02/05/2008    Carney Living  PT DPT  05/28/2019, 8:07 AM  [facility] [address] [place], [place], [address] Phone: [phone]   Fax:  [phone]  Name: Kelly [name] MRN: [id number] Date of Birth: 07/08/1966

## 2019-05-29 ENCOUNTER — Ambulatory Visit: Payer: 59 | Admitting: Physical Therapy

## 2019-05-29 ENCOUNTER — Other Ambulatory Visit: Payer: Self-pay

## 2019-05-29 DIAGNOSIS — M25512 Pain in left shoulder: Secondary | ICD-10-CM

## 2019-05-29 DIAGNOSIS — M542 Cervicalgia: Secondary | ICD-10-CM | POA: Diagnosis not present

## 2019-05-29 DIAGNOSIS — M25612 Stiffness of left shoulder, not elsewhere classified: Secondary | ICD-10-CM | POA: Diagnosis not present

## 2019-05-29 DIAGNOSIS — M62838 Other muscle spasm: Secondary | ICD-10-CM | POA: Diagnosis not present

## 2019-05-30 ENCOUNTER — Encounter: Payer: Self-pay | Admitting: Physical Therapy

## 2019-05-30 NOTE — Therapy (Signed)
[place], [place], [address] Phone: [phone]   Fax:  [phone]  Physical Therapy Treatment  Patient Details  Name: Kelly [name] MRN: [id number] Date of Birth: 05-Nov-1965 Referring Provider (PT): Dr Joanne Chars   Encounter Date: 05/29/2019  PT End of Session - 05/30/19 1210    Visit Number  10    Number of Visits  12    Date for PT Re-Evaluation  06/05/19    Authorization Type  [facility]    PT Start Time  1500    PT Stop Time  1553    PT Time Calculation (min)  53 min    Activity Tolerance  Patient tolerated treatment well    Behavior During Therapy  [facility] for tasks assessed/performed       Past Medical History:  Diagnosis Date  . Anticardiolipin antibody positive   . HSV-2 (herpes simplex virus 2) infection   . Hyperlipidemia   . Hypertension    diet controlled  . Kidney stone   . Migraine   . Renal calculus, right   . Sleep apnea   . TMJ (dislocation of temporomandibular joint)     Past Surgical History:  Procedure Laterality Date  . CESAREAN SECTION     X3   LAST ONE 11-30-2004  . CYSTOSCOPY W/ RETROGRADES Right 10/29/2012   Procedure: CYSTOSCOPY WITH RETROGRADE PYELOGRAM and stent placement;  Surgeon: Reece Packer, MD;  Location: [facility];  Service: Urology;  Laterality: Right;  rt stent placement , rt retrograde and cysto   . CYSTOSCOPY W/ URETERAL STENT PLACEMENT Right 11/23/2012   Procedure: CYSTOSCOPY WITH STENT REPLACEMENT;  Surgeon: Alexis Frock, MD;  Location: [facility];  Service: Urology;  Laterality: Right;  . CYSTOSCOPY/RETROGRADE/URETEROSCOPY/STONE EXTRACTION WITH BASKET Right 11/23/2012   Procedure: CYSTOSCOPY/RETROGRADE/URETEROSCOPY/STONE EXTRACTION WITH BASKET;  Surgeon: Alexis Frock, MD;  Location: [facility];  Service: Urology;  Laterality: Right;    There were no vitals filed for this visit.  Subjective  Assessment - 05/30/19 1208    Subjective  Patient is very sore today. She has had a stressful day at work. She has significant pain radiating down into her neck.    Pertinent History  TMJ, migranes,    Limitations  Sitting    How long can you stand comfortably?  increased lower back when standing    How long can you walk comfortably?  increased pain with ambualtion    Diagnostic tests  No x-rays in the chart.    Currently in Pain?  Yes    Pain Score  8     Pain Location  Neck    Pain Orientation  Left;Right    Pain Descriptors / Indicators  Aching    Pain Type  Chronic pain    Pain Onset  More than a month ago    Pain Frequency  Constant    Aggravating Factors   movement of the head    Pain Relieving Factors  rest    Effect of Pain on Daily Activities  difficulty performed ADL's    Multiple Pain Sites  No                       [facility] Adult PT Treatment/Exercise - 05/30/19 0001      Electrical Stimulation   Electrical Stimulation Location  cervical spine     Electrical Stimulation Action  IFC     Electrical Stimulation  Parameters  to tolerance     Electrical Stimulation Goals  Pain      Manual Therapy   Manual Therapy  Passive ROM    Soft tissue mobilization  trigger point release to cervical parapinals, upper trap , IASTYM to upper trap and shoulder.     Manual Traction  gentle cervical traction; sub-occipital release              PT Education - 05/30/19 1210    Education Details  HEP and symptom management    Person(s) Educated  Patient    Methods  Explanation;Demonstration;Tactile cues;Verbal cues    Comprehension  Verbalized understanding;Returned demonstration;Verbal cues required;Tactile cues required       PT Short Term Goals - 05/28/19 0806      PT SHORT TERM GOAL #1   Title  Patient will be indepdent with relaxation and breathing techniques to reduce actue inflamtion    Baseline  reviewed decompression position    Time  3    Period  Weeks     Status  On-going    Target Date  04/02/19      PT SHORT TERM GOAL #2   Title  Patient will report no radiating pain down her left arm    Baseline  intermitent depending on activity    Time  3    Period  Weeks    Status  On-going    Target Date  07/10/18      PT SHORT TERM GOAL #3   Title  Patient will report 3/10 pain at worst after work    Time  3    Period  Weeks    Target Date  05/10/19        PT Long Term Goals - 04/19/19 0751      PT LONG TERM GOAL #1   Title  Patient will demonstrate 70 degrees of passive rotation ROM bilateral without pain    Baseline  initial measure 0 degrees    Time  6    Period  Weeks    Status  On-going      PT LONG TERM GOAL #2   Title  Patient will sit at her desk for 4 hours without pain    Time  6    Period  Weeks    Status  New    Target Date  05/31/19      PT LONG TERM GOAL #3   Title  Patient will ambulate without lower back pain in order to perfrom ADL's    Time  6    Period  Weeks    Status  On-going            Plan - 05/30/19 1212    Clinical Impression Statement  Therapy focused on maual therapy today. She was in significant pain. She reported improved pain after treatment. Therapy will continue with ther-ex next visit.    Comorbidities  history of cervical spine pain, TMJ    Examination-Activity Limitations  Lift;Reach Overhead;Locomotion Level    Stability/Clinical Decision Making  Evolving/Moderate complexity    Clinical Decision Making  Low    Rehab Potential  Good    PT Frequency  2x / week    PT Duration  6 weeks    PT Treatment/Interventions  ADLs/Self Care Home Management;Cryotherapy;Electrical Stimulation;Iontophoresis 4mg /ml Dexamethasone;Moist Heat;Ultrasound;Traction;DME Instruction;Gait training;Stair training;Functional mobility training;Therapeutic activities;Therapeutic exercise;Neuromuscular re-education;Patient/family education;Manual techniques;Passive range of motion;Dry needling;Taping    PT  Next Visit Plan  continue to progress exercises as  tolerated. continue with manual therapy. Continue with manual traction. Did not respond well to mechanical traction.    PT Home Exercise Plan  leg lengthener, decompression with shoulder sink, cerivical rotation 3x in pain free range, AAROM cane; piriformis stretch, lower trunk rotation    Consulted and Agree with Plan of Care  Patient       Patient will benefit from skilled therapeutic intervention in order to improve the following deficits and impairments:  Pain, Increased muscle spasms, Decreased activity tolerance, Decreased strength, Decreased range of motion  Visit Diagnosis: Cervicalgia  Other muscle spasm  Acute pain of left shoulder  Stiffness of left shoulder, not elsewhere classified     Problem List Patient Active Problem List   Diagnosis Date Noted  . Protrusion of cervical intervertebral disc 04/10/2019  . Spinal stenosis of cervical region 04/10/2019  . Overactive bladder 11/19/2018  . Photosensitization due to sun 11/02/2018  . Generalized pain 11/02/2018  . High risk medication use 11/02/2018  . Foot pain, bilateral 05/10/2018  . Heel pain, bilateral 05/10/2018  . Plantar fasciitis 05/10/2018  . Left knee pain 05/10/2018  . Patellofemoral pain syndrome of both knees 05/10/2018  . Generalized abdominal pain 04/17/2018  . Flank pain 04/17/2018  . Acute back pain 04/17/2018  . History of renal stone 04/17/2018  . Urinary frequency 04/17/2018  . Laceration of left hand 01/31/2018  . Decreased range of motion of finger of left hand 01/31/2018  . Localized swelling on left hand 01/31/2018  . Chronic migraine without aura without status migrainosus, not intractable 11/01/2016  . History of migraine 06/10/2016  . Diarrhea 05/20/2016  . Pain in the chest 05/11/2016  . Dizziness and giddiness 05/11/2016  . History of hypertension 05/11/2016  . Hemorrhoidal skin tag 04/07/2015  . IUD (intrauterine device) in  place 05/03/2013  . Allergic rhinitis 01/11/2013  . Current smoker 01/11/2013  . Weight gain 06/26/2012  . Cervical stenosis (uterine cervix) 04/25/2012  . Anticardiolipin antibody positive   . Hyperlipidemia   . Hypertension   . History of trichomoniasis 01/18/2011    Class: History of  . THYROID NODULE, RIGHT 02/05/2008  . GERD 02/05/2008  . NECK PAIN 02/05/2008    Carney Living PT DPT  05/30/2019, 12:27 PM  [facility] [address] [place], [place], [address] Phone: [phone]   Fax:  [phone]  Name: Kelly [name] MRN: [id number] Date of Birth: 1966/01/18

## 2019-05-31 ENCOUNTER — Other Ambulatory Visit: Payer: Self-pay | Admitting: Family Medicine

## 2019-05-31 MED ORDER — HYDROCODONE-ACETAMINOPHEN 5-325 MG PO TABS: 1.0000 | ORAL_TABLET | Freq: Every evening | ORAL | 0 refills | Status: DC | PRN

## 2019-05-31 MED FILL — HYDROCODON-APAP 5-325: 5-325 | 10 days supply | Qty: 20 | Fill #0

## 2019-05-31 NOTE — Telephone Encounter (Signed)
Called and left a VM advising patient that Rx has been approved and sent to her pharmacy.

## 2019-06-03 ENCOUNTER — Ambulatory Visit: Payer: 59 | Admitting: Physical Therapy

## 2019-06-03 ENCOUNTER — Other Ambulatory Visit: Payer: Self-pay

## 2019-06-03 DIAGNOSIS — M25612 Stiffness of left shoulder, not elsewhere classified: Secondary | ICD-10-CM | POA: Diagnosis not present

## 2019-06-03 DIAGNOSIS — M542 Cervicalgia: Secondary | ICD-10-CM | POA: Diagnosis not present

## 2019-06-03 DIAGNOSIS — M25512 Pain in left shoulder: Secondary | ICD-10-CM | POA: Diagnosis not present

## 2019-06-03 DIAGNOSIS — M62838 Other muscle spasm: Secondary | ICD-10-CM | POA: Diagnosis not present

## 2019-06-04 ENCOUNTER — Encounter: Payer: Self-pay | Admitting: Physical Therapy

## 2019-06-04 NOTE — Therapy (Signed)
[place], [place], [address] Phone: [phone]   Fax:  [phone]  Physical Therapy Treatment  Patient Details  Name: MURRY CITIZEN MRN: [id number] Date of Birth: [birth year] Referring Provider (PT): Dr Joanne Chars   Encounter Date: 06/03/2019  PT End of Session - 06/04/19 0923    Visit Number  11    Number of Visits  12    Date for PT Re-Evaluation  06/05/19    Authorization Type  [facility] [employer]    PT Start Time  1630    PT Stop Time  1725    PT Time Calculation (min)  55 min    Activity Tolerance  Patient tolerated treatment well    Behavior During Therapy  [facility] for tasks assessed/performed       Past Medical History:  Diagnosis Date  . Anticardiolipin antibody positive   . HSV-2 (herpes simplex virus 2) infection   . Hyperlipidemia   . Hypertension    diet controlled  . Kidney stone   . Migraine   . Renal calculus, right   . Sleep apnea   . TMJ (dislocation of temporomandibular joint)     Past Surgical History:  Procedure Laterality Date  . CESAREAN SECTION     X3   LAST ONE 11-30-2004  . CYSTOSCOPY W/ RETROGRADES Right 10/29/2012   Procedure: CYSTOSCOPY WITH RETROGRADE PYELOGRAM and stent placement;  Surgeon: Reece Packer, MD;  Location: [facility];  Service: Urology;  Laterality: Right;  rt stent placement , rt retrograde and cysto   . CYSTOSCOPY W/ URETERAL STENT PLACEMENT Right 11/23/2012   Procedure: CYSTOSCOPY WITH STENT REPLACEMENT;  Surgeon: Alexis Frock, MD;  Location: [facility];  Service: Urology;  Laterality: Right;  . CYSTOSCOPY/RETROGRADE/URETEROSCOPY/STONE EXTRACTION WITH BASKET Right 11/23/2012   Procedure: CYSTOSCOPY/RETROGRADE/URETEROSCOPY/STONE EXTRACTION WITH BASKET;  Surgeon: Alexis Frock, MD;  Location: [facility];  Service: Urology;  Laterality: Right;    There were no vitals filed for this visit.  Subjective  Assessment - 06/04/19 0845    Subjective  Patient is sore again today. She is more sore on the right side. She feels like the weekend was pretty good. When she stis at work her pain increases.    Pertinent History  TMJ, migranes,    Limitations  Sitting    How long can you stand comfortably?  increased lower back when standing    How long can you walk comfortably?  increased pain with ambualtion    Diagnostic tests  No x-rays in the chart.    Currently in Pain?  Yes    Pain Score  4     Pain Location  Neck    Pain Orientation  Right    Pain Descriptors / Indicators  Aching    Pain Type  Chronic pain    Pain Onset  More than a month ago    Pain Frequency  Constant    Aggravating Factors   sitting in bad posture    Pain Relieving Factors  rest    Effect of Pain on Daily Activities  difficulty perfroming ADL's                       [facility] Adult PT Treatment/Exercise - 06/04/19 0001      Neck Exercises: Standing   Other Standing Exercises  scap retraction 2x10 yellow ; shoulder extension 2x10 yellow       Manual Therapy  Manual Therapy  Passive ROM    Soft tissue mobilization  trigger point release to cervical parapinals, upper trap , IASTYM to upper trap and shoulder.     Manual Traction  gentle cervical traction; sub-occipital release              PT Education - 06/04/19 0923    Education Details  reviewed mprotance of postural correction    Person(s) Educated  Patient    Methods  Explanation;Demonstration;Tactile cues;Verbal cues    Comprehension  Verbalized understanding;Returned demonstration;Verbal cues required;Tactile cues required       PT Short Term Goals - 05/28/19 0806      PT SHORT TERM GOAL #1   Title  Patient will be indepdent with relaxation and breathing techniques to reduce actue inflamtion    Baseline  reviewed decompression position    Time  3    Period  Weeks    Status  On-going    Target Date  04/02/19      PT SHORT TERM GOAL  #2   Title  Patient will report no radiating pain down her left arm    Baseline  intermitent depending on activity    Time  3    Period  Weeks    Status  On-going    Target Date  07/10/18      PT SHORT TERM GOAL #3   Title  Patient will report 3/10 pain at worst after work    Time  3    Period  Weeks    Target Date  05/10/19        PT Long Term Goals - 04/19/19 0751      PT LONG TERM GOAL #1   Title  Patient will demonstrate 70 degrees of passive rotation ROM bilateral without pain    Baseline  initial measure 0 degrees    Time  6    Period  Weeks    Status  On-going      PT LONG TERM GOAL #2   Title  Patient will sit at her desk for 4 hours without pain    Time  6    Period  Weeks    Status  New    Target Date  05/31/19      PT LONG TERM GOAL #3   Title  Patient will ambulate without lower back pain in order to perfrom ADL's    Time  6    Period  Weeks    Status  On-going            Plan - 06/04/19 1026    Clinical Impression Statement  Patient had less pain then last visit but she was still limited. Therapy was able to add in light band exercises. She flet better after treatment. She will have her MRI soon then a decision will be made about treatment.    Personal Factors and Comorbidities  Comorbidity 1;Comorbidity 2;Profession    Comorbidities  history of cervical spine pain, TMJ    Examination-Activity Limitations  Lift;Reach Overhead;Locomotion Level    Examination-Participation Restrictions  Shop;Driving;Community Activity;Cleaning;Meal Prep    Stability/Clinical Decision Making  Evolving/Moderate complexity    Clinical Decision Making  Low    Rehab Potential  Good    PT Duration  6 weeks    PT Treatment/Interventions  ADLs/Self Care Home Management;Cryotherapy;Electrical Stimulation;Iontophoresis 4mg /ml Dexamethasone;Moist Heat;Ultrasound;Traction;DME Instruction;Gait training;Stair training;Functional mobility training;Therapeutic activities;Therapeutic  exercise;Neuromuscular re-education;Patient/family education;Manual techniques;Passive range of motion;Dry needling;Taping    PT Next Visit Plan  continue to progress exercises as tolerated. continue with manual therapy. Continue with manual traction. Did not respond well to mechanical traction.    PT Home Exercise Plan  leg lengthener, decompression with shoulder sink, cerivical rotation 3x in pain free range, AAROM cane; piriformis stretch, lower trunk rotation    Consulted and Agree with Plan of Care  Patient       Patient will benefit from skilled therapeutic intervention in order to improve the following deficits and impairments:     Visit Diagnosis: Cervicalgia  Other muscle spasm  Acute pain of left shoulder  Stiffness of left shoulder, not elsewhere classified     Problem List Patient Active Problem List   Diagnosis Date Noted  . Protrusion of cervical intervertebral disc 04/10/2019  . Spinal stenosis of cervical region 04/10/2019  . Overactive bladder 11/19/2018  . Photosensitization due to sun 11/02/2018  . Generalized pain 11/02/2018  . High risk medication use 11/02/2018  . Foot pain, bilateral 05/10/2018  . Heel pain, bilateral 05/10/2018  . Plantar fasciitis 05/10/2018  . Left knee pain 05/10/2018  . Patellofemoral pain syndrome of both knees 05/10/2018  . Generalized abdominal pain 04/17/2018  . Flank pain 04/17/2018  . Acute back pain 04/17/2018  . History of renal stone 04/17/2018  . Urinary frequency 04/17/2018  . Laceration of left hand 01/31/2018  . Decreased range of motion of finger of left hand 01/31/2018  . Localized swelling on left hand 01/31/2018  . Chronic migraine without aura without status migrainosus, not intractable 11/01/2016  . History of migraine 06/10/2016  . Diarrhea 05/20/2016  . Pain in the chest 05/11/2016  . Dizziness and giddiness 05/11/2016  . History of hypertension 05/11/2016  . Hemorrhoidal skin tag 04/07/2015  . IUD  (intrauterine device) in place 05/03/2013  . Allergic rhinitis 01/11/2013  . Current smoker 01/11/2013  . Weight gain 06/26/2012  . Cervical stenosis (uterine cervix) 04/25/2012  . Anticardiolipin antibody positive   . Hyperlipidemia   . Hypertension   . History of trichomoniasis 01/18/2011    Class: History of  . THYROID NODULE, RIGHT 02/05/2008  . GERD 02/05/2008  . NECK PAIN 02/05/2008    Carney Living PT DPT  06/04/2019, 10:47 AM  [facility] [address] [place], [place], [address] Phone: [phone]   Fax:  [phone]  Name: SIVANA KOLENOVIC MRN: [id number] Date of Birth: [birth year]

## 2019-06-05 ENCOUNTER — Encounter: Payer: Self-pay | Admitting: Physical Therapy

## 2019-06-05 ENCOUNTER — Ambulatory Visit: Payer: 59 | Admitting: Physical Therapy

## 2019-06-05 ENCOUNTER — Other Ambulatory Visit: Payer: Self-pay

## 2019-06-05 DIAGNOSIS — M542 Cervicalgia: Secondary | ICD-10-CM | POA: Diagnosis not present

## 2019-06-05 DIAGNOSIS — M25512 Pain in left shoulder: Secondary | ICD-10-CM

## 2019-06-05 DIAGNOSIS — M25612 Stiffness of left shoulder, not elsewhere classified: Secondary | ICD-10-CM

## 2019-06-05 DIAGNOSIS — M62838 Other muscle spasm: Secondary | ICD-10-CM

## 2019-06-05 NOTE — Therapy (Signed)
[place], [place], [address] Phone: [phone]   Fax:  [phone]  Physical Therapy Treatment  Patient Details  Name: Kelly [name] MRN: [id number] Date of Birth: [birth year] Referring Provider (PT): Dr Joanne Chars   Encounter Date: 06/05/2019  PT End of Session - 06/05/19 1738    Visit Number  12    Date for PT Re-Evaluation  06/05/19    Authorization Type  [facility]    PT Start Time  1500    PT Stop Time  1538    PT Time Calculation (min)  38 min    Activity Tolerance  Patient tolerated treatment well    Behavior During Therapy  [facility] for tasks assessed/performed       Past Medical History:  Diagnosis Date  . Anticardiolipin antibody positive   . HSV-2 (herpes simplex virus 2) infection   . Hyperlipidemia   . Hypertension    diet controlled  . Kidney stone   . Migraine   . Renal calculus, right   . Sleep apnea   . TMJ (dislocation of temporomandibular joint)     Past Surgical History:  Procedure Laterality Date  . CESAREAN SECTION     X3   LAST ONE 11-30-2004  . CYSTOSCOPY W/ RETROGRADES Right 10/29/2012   Procedure: CYSTOSCOPY WITH RETROGRADE PYELOGRAM and stent placement;  Surgeon: Reece Packer, MD;  Location: [place];  Service: Urology;  Laterality: Right;  rt stent placement , rt retrograde and cysto   . CYSTOSCOPY W/ URETERAL STENT PLACEMENT Right 11/23/2012   Procedure: CYSTOSCOPY WITH STENT REPLACEMENT;  Surgeon: Alexis Frock, MD;  Location: [facility];  Service: Urology;  Laterality: Right;  . CYSTOSCOPY/RETROGRADE/URETEROSCOPY/STONE EXTRACTION WITH BASKET Right 11/23/2012   Procedure: CYSTOSCOPY/RETROGRADE/URETEROSCOPY/STONE EXTRACTION WITH BASKET;  Surgeon: Alexis Frock, MD;  Location[facility];  Service: Urology;  Laterality: Right;    There were no vitals filed for this visit.  Subjective Assessment - 06/05/19 1737     Subjective  Pain improved from lastvisit but still sore.    Limitations  Sitting    How long can you stand comfortably?  increased lower back when standing    How long can you walk comfortably?  increased pain with ambualtion    Patient Stated Goals  to have less pain    Currently in Pain?  Yes    Pain Score  3     Pain Location  Neck    Pain Orientation  Left    Pain Descriptors / Indicators  Aching    Pain Type  Chronic pain    Pain Onset  More than a month ago    Pain Frequency  Constant    Aggravating Factors   sitting in bad psotrue    Effect of Pain on Daily Activities  difficulty perfroming work tasks                       [facility] Adult PT Treatment/Exercise - 06/05/19 1741      Neck Exercises: Machines for Strengthening   UBE (Upper Arm Bike)  2 mins posterior       Neck Exercises: Standing   Other Standing Exercises  scap retraction 2x10  green  ; shoulder extension 2x10 green      Moist Heat Therapy   Number Minutes Moist Heat  10 Minutes    Moist Heat Location  Cervical  Programme researcher, broadcasting/film/video  [employer]    Electrical Stimulation Parameters  to tolerance     Electrical Stimulation Goals  Pain      Manual Therapy   Manual Therapy  Passive ROM    Soft tissue mobilization  trigger point release to cervical parapinals, upper trap , IASTYM to upper trap and shoulder.     Manual Traction  gentle cervical traction; sub-occipital release              PT Education - 06/05/19 1738    Education Details  postrue at work    [employer]) Educated  Patient    Methods  Explanation;Demonstration    Comprehension  Verbalized understanding;Returned demonstration;Verbal cues required;Tactile cues required       PT Short Term Goals - 05/28/19 0806      PT SHORT TERM GOAL #1   Title  Patient will be indepdent with relaxation and breathing techniques to reduce actue inflamtion     Baseline  reviewed decompression position    Time  3    Period  Weeks    Status  On-going    Target Date  04/02/19      PT SHORT TERM GOAL #2   Title  Patient will report no radiating pain down her left arm    Baseline  intermitent depending on activity    Time  3    Period  Weeks    Status  On-going    Target Date  07/10/18      PT SHORT TERM GOAL #3   Title  Patient will report 3/10 pain at worst after work    Time  3    Period  Weeks    Target Date  05/10/19        PT Long Term Goals - 04/19/19 0751      PT LONG TERM GOAL #1   Title  Patient will demonstrate 70 degrees of passive rotation ROM bilateral without pain    Baseline  initial measure 0 degrees    Time  6    Period  Weeks    Status  On-going      PT LONG TERM GOAL #2   Title  Patient will sit at her desk for 4 hours without pain    Time  6    Period  Weeks    Status  New    Target Date  05/31/19      PT LONG TERM GOAL #3   Title  Patient will ambulate without lower back pain in order to perfrom ADL's    Time  6    Period  Weeks    Status  On-going            Plan - 06/05/19 1739    Clinical Impression Statement  Able to add back in UBE today. Reviewed the improtance of paoture at work. Overall no real change. Patient will have MRI next week. PT is keeping her pain manageable but no significant improvement noted. Re-assess next visit.    Personal Factors and Comorbidities  Comorbidity 1;Comorbidity 2;Profession    Comorbidities  history of cervical spine pain, TMJ    Examination-Activity Limitations  Lift;Reach Overhead;Locomotion Level    Examination-Participation Restrictions  Shop;Driving;Community Activity;Cleaning;Meal Prep    Stability/Clinical Decision Making  Evolving/Moderate complexity    Clinical Decision Making  Low    PT Frequency  2x / week  PT Duration  6 weeks    PT Treatment/Interventions  ADLs/Self Care Home Management;Cryotherapy;Electrical Stimulation;Iontophoresis  4mg /ml Dexamethasone;Moist Heat;Ultrasound;Traction;DME Instruction;Gait training;Stair training;Functional mobility training;Therapeutic activities;Therapeutic exercise;Neuromuscular re-education;Patient/family education;Manual techniques;Passive range of motion;Dry needling;Taping    PT Next Visit Plan  continue to progress exercises as tolerated. continue with manual therapy. Continue with manual traction. Did not respond well to mechanical traction.    PT Home Exercise Plan  leg lengthener, decompression with shoulder sink, cerivical rotation 3x in pain free range, AAROM cane; piriformis stretch, lower trunk rotation       Patient will benefit from skilled therapeutic intervention in order to improve the following deficits and impairments:  Pain, Increased muscle spasms, Decreased activity tolerance, Decreased strength, Decreased range of motion  Visit Diagnosis: Cervicalgia  Other muscle spasm  Acute pain of left shoulder  Stiffness of left shoulder, not elsewhere classified     Problem List Patient Active Problem List   Diagnosis Date Noted  . Protrusion of cervical intervertebral disc 04/10/2019  . Spinal stenosis of cervical region 04/10/2019  . Overactive bladder 11/19/2018  . Photosensitization due to sun 11/02/2018  . Generalized pain 11/02/2018  . High risk medication use 11/02/2018  . Foot pain, bilateral 05/10/2018  . Heel pain, bilateral 05/10/2018  . Plantar fasciitis 05/10/2018  . Left knee pain 05/10/2018  . Patellofemoral pain syndrome of both knees 05/10/2018  . Generalized abdominal pain 04/17/2018  . Flank pain 04/17/2018  . Acute back pain 04/17/2018  . History of renal stone 04/17/2018  . Urinary frequency 04/17/2018  . Laceration of left hand 01/31/2018  . Decreased range of motion of finger of left hand 01/31/2018  . Localized swelling on left hand 01/31/2018  . Chronic migraine without aura without status migrainosus, not intractable 11/01/2016  .  History of migraine 06/10/2016  . Diarrhea 05/20/2016  . Pain in the chest 05/11/2016  . Dizziness and giddiness 05/11/2016  . History of hypertension 05/11/2016  . Hemorrhoidal skin tag 04/07/2015  . IUD (intrauterine device) in place 05/03/2013  . Allergic rhinitis 01/11/2013  . Current smoker 01/11/2013  . Weight gain 06/26/2012  . Cervical stenosis (uterine cervix) 04/25/2012  . Anticardiolipin antibody positive   . Hyperlipidemia   . Hypertension   . History of trichomoniasis 01/18/2011    Class: History of  . THYROID NODULE, RIGHT 02/05/2008  . GERD 02/05/2008  . NECK PAIN 02/05/2008    Carney Living PT DPT  06/05/2019, 5:42 PM  [facility] [address] [place], [place], [address] Phone: [phone]   Fax:  [phone]  Name: Kelly [name] MRN: [id number] Date of Birth: June 13, 1966

## 2019-06-11 ENCOUNTER — Ambulatory Visit: Payer: 59 | Admitting: Physical Therapy

## 2019-06-11 ENCOUNTER — Encounter: Payer: Self-pay | Admitting: Physical Therapy

## 2019-06-11 ENCOUNTER — Other Ambulatory Visit: Payer: Self-pay

## 2019-06-11 ENCOUNTER — Ambulatory Visit
Admission: RE | Admit: 2019-06-11 | Discharge: 2019-06-11 | Disposition: A | Discharge status: Home / Self Care | Payer: 59 | Source: Ambulatory Visit | Attending: Orthopedic Surgery | Admitting: Orthopedic Surgery

## 2019-06-11 DIAGNOSIS — M25512 Pain in left shoulder: Secondary | ICD-10-CM

## 2019-06-11 DIAGNOSIS — M50221 Other cervical disc displacement at C4-C5 level: Secondary | ICD-10-CM | POA: Diagnosis not present

## 2019-06-11 DIAGNOSIS — M50223 Other cervical disc displacement at C6-C7 level: Secondary | ICD-10-CM | POA: Diagnosis not present

## 2019-06-11 DIAGNOSIS — M4802 Spinal stenosis, cervical region: Secondary | ICD-10-CM | POA: Diagnosis not present

## 2019-06-11 DIAGNOSIS — M25612 Stiffness of left shoulder, not elsewhere classified: Secondary | ICD-10-CM | POA: Diagnosis not present

## 2019-06-11 DIAGNOSIS — M542 Cervicalgia: Secondary | ICD-10-CM | POA: Diagnosis not present

## 2019-06-11 DIAGNOSIS — M62838 Other muscle spasm: Secondary | ICD-10-CM

## 2019-06-11 DIAGNOSIS — G959 Disease of spinal cord, unspecified: Secondary | ICD-10-CM

## 2019-06-11 NOTE — Therapy (Signed)
[place], [place], [address] Phone: [phone]   Fax:  [phone]  Physical Therapy Treatment  Patient Details  Name: REVAE [name] MRN: [id number] Date of Birth: [birth year] Referring Provider (PT): Dr Joanne Chars   Encounter Date: 06/11/2019  PT End of Session - 06/11/19 0942    Visit Number  13    Number of Visits  20    Date for PT Re-Evaluation  07/09/19    Authorization Type  [facility] [employer]    PT Start Time  0849    PT Stop Time  0945    PT Time Calculation (min)  56 min    Activity Tolerance  Patient tolerated treatment well    Behavior During Therapy  [facility] for tasks assessed/performed       Past Medical History:  Diagnosis Date  . Anticardiolipin antibody positive   . HSV-2 (herpes simplex virus 2) infection   . Hyperlipidemia   . Hypertension    diet controlled  . Kidney stone   . Migraine   . Renal calculus, right   . Sleep apnea   . TMJ (dislocation of temporomandibular joint)     Past Surgical History:  Procedure Laterality Date  . CESAREAN SECTION     X3   LAST ONE 11-30-2004  . CYSTOSCOPY W/ RETROGRADES Right 10/29/2012   Procedure: CYSTOSCOPY WITH RETROGRADE PYELOGRAM and stent placement;  Surgeon: Reece Packer, MD;  Location: [facility];  Service: Urology;  Laterality: Right;  rt stent placement , rt retrograde and cysto   . CYSTOSCOPY W/ URETERAL STENT PLACEMENT Right 11/23/2012   Procedure: CYSTOSCOPY WITH STENT REPLACEMENT;  Surgeon: Alexis Frock, MD;  Location: [facility];  Service: Urology;  Laterality: Right;  . CYSTOSCOPY/RETROGRADE/URETEROSCOPY/STONE EXTRACTION WITH BASKET Right 11/23/2012   Procedure: CYSTOSCOPY/RETROGRADE/URETEROSCOPY/STONE EXTRACTION WITH BASKET;  Surgeon: Alexis Frock, MD;  Location: [facility];  Service: Urology;  Laterality: Right;    There were no vitals filed for this visit.  Subjective  Assessment - 06/11/19 0913    Subjective  Patient reports it was pretty good poer the weekend. She is just a little sore today on the left side. She has her MRI today.    Pertinent History  TMJ, migranes,    Limitations  Sitting    How long can you stand comfortably?  increased lower back when standing    How long can you walk comfortably?  increased pain with ambualtion    Diagnostic tests  No x-rays in the chart.    Patient Stated Goals  to have less pain    Currently in Pain?  Yes    Pain Score  3     Pain Location  Neck    Pain Orientation  Right    Pain Type  Chronic pain    Pain Onset  More than a month ago    Pain Frequency  Constant    Aggravating Factors   work    Pain Relieving Factors  rest    Effect of Pain on Daily Activities  difficulty perfroming work tasks         [facility] PT Assessment - 06/11/19 0001      AROM   Cervical Flexion  27    Cervical Extension  28    Cervical - Right Rotation  60    Cervical - Left Rotation  60      Strength   Left Shoulder Flexion  4+/5  Left Shoulder Internal Rotation  4+/5    Left Shoulder External Rotation  4+/5                   [facility] Adult PT Treatment/Exercise - 06/11/19 0001      Neck Exercises: Machines for Strengthening   UBE (Upper Arm Bike)  2 mins posterior       Neck Exercises: Standing   Other Standing Exercises  scap retraction 2x10  green  ; shoulder extension 2x10 green      Neck Exercises: Supine   Other Supine Exercise  bilateral ER red 2x10; horizontal abduction 10 red;        Moist Heat Therapy   Moist Heat Location  Cervical      Electrical Stimulation   Electrical Stimulation Location  cervical spine     Electrical Stimulation Action  IFC     Electrical Stimulation Parameters  to tolerance     Electrical Stimulation Goals  Pain      Manual Therapy   Manual Therapy  Passive ROM    Soft tissue mobilization  trigger point release to cervical parapinals, upper trap , IASTYM to upper  trap and shoulder.     Manual Traction  gentle cervical traction; sub-occipital release              PT Education - 06/11/19 0931    Education Details  reviewed HEP and symptom managment    Person(s) Educated  Patient    Methods  Explanation;Demonstration    Comprehension  Verbalized understanding;Returned demonstration;Verbal cues required;Tactile cues required       PT Short Term Goals - 06/11/19 0950      PT SHORT TERM GOAL #1   Title  Patient will be indepdent with relaxation and breathing techniques to reduce actue inflamtion. She had a minor improvement in cervical movement. She continues to have spasming in the upper traps and cervical paraspinals. She has shown improved left UE atrength as well. Therapy will proceed per MD reccomendation following MRI. If she plans on a conservative apporach we will continue to work on posutral correction and manual therapy.    Baseline  reviewed decompression position    Time  3    Period  Weeks    Status  On-going    Target Date  04/02/19      PT SHORT TERM GOAL #2   Title  Patient will report no radiating pain down her left arm    Baseline  intermitent depending on activity    Time  3    Period  Weeks    Status  On-going    Target Date  07/10/18      PT SHORT TERM GOAL #3   Title  Patient will report 3/10 pain at worst after work    Baseline  3/10 today    Time  3    Period  Weeks    Status  On-going        PT Long Term Goals - 06/11/19 1319      PT LONG TERM GOAL #1   Title  Patient will demonstrate 70 degrees of passive rotation ROM bilateral without pain    Baseline  60 bilateral    Time  6    Period  Weeks    Status  On-going      PT LONG TERM GOAL #2   Title  Patient will sit at her desk for 4 hours without pain    Baseline  incfrease pain as she sits    Time  6    Period  Weeks    Status  On-going      PT LONG TERM GOAL #3   Title  Patient will ambulate without lower back pain in order to perfrom ADL's     Baseline  PT has been focusing on her neck    Time  6    Period  Weeks    Status  On-going      PT LONG TERM GOAL #4   Title  increase FOTO score to </= 35% limited to demo improvement in function     Baseline  not taken    Time  6    Period  Weeks    Status  On-going            Plan - 06/11/19 0943    Clinical Impression Statement  Patient continues to have improvement with PT sessions but very little carryover. She hads more pain as the day and week goes on. Her ranges are about the same.    Comorbidities  history of cervical spine pain, TMJ    Examination-Activity Limitations  Lift;Reach Overhead;Locomotion Level    Examination-Participation Restrictions  Shop;Driving;Community Activity;Cleaning;Meal Prep    Stability/Clinical Decision Making  Evolving/Moderate complexity    Clinical Decision Making  Low    Rehab Potential  Good    PT Frequency  2x / week    PT Duration  6 weeks    PT Treatment/Interventions  ADLs/Self Care Home Management;Cryotherapy;Electrical Stimulation;Iontophoresis 4mg /ml Dexamethasone;Moist Heat;Ultrasound;Traction;DME Instruction;Gait training;Stair training;Functional mobility training;Therapeutic activities;Therapeutic exercise;Neuromuscular re-education;Patient/family education;Manual techniques;Passive range of motion;Dry needling;Taping    PT Next Visit Plan  continue to progress exercises as tolerated. continue with manual therapy. Continue with manual traction. Did not respond well to mechanical traction.    PT Home Exercise Plan  leg lengthener, decompression with shoulder sink, cerivical rotation 3x in pain free range, AAROM cane; piriformis stretch, lower trunk rotation    Consulted and Agree with Plan of Care  Patient       Patient will benefit from skilled therapeutic intervention in order to improve the following deficits and impairments:  Pain, Increased muscle spasms, Decreased activity tolerance, Decreased strength, Decreased range of  motion  Visit Diagnosis: Cervicalgia  Other muscle spasm  Acute pain of left shoulder  Stiffness of left shoulder, not elsewhere classified     Problem List Patient Active Problem List   Diagnosis Date Noted  . Protrusion of cervical intervertebral disc 04/10/2019  . Spinal stenosis of cervical region 04/10/2019  . Overactive bladder 11/19/2018  . Photosensitization due to sun 11/02/2018  . Generalized pain 11/02/2018  . High risk medication use 11/02/2018  . Foot pain, bilateral 05/10/2018  . Heel pain, bilateral 05/10/2018  . Plantar fasciitis 05/10/2018  . Left knee pain 05/10/2018  . Patellofemoral pain syndrome of both knees 05/10/2018  . Generalized abdominal pain 04/17/2018  . Flank pain 04/17/2018  . Acute back pain 04/17/2018  . History of renal stone 04/17/2018  . Urinary frequency 04/17/2018  . Laceration of left hand 01/31/2018  . Decreased range of motion of finger of left hand 01/31/2018  . Localized swelling on left hand 01/31/2018  . Chronic migraine without aura without status migrainosus, not intractable 11/01/2016  . History of migraine 06/10/2016  . Diarrhea 05/20/2016  . Pain in the chest 05/11/2016  . Dizziness and giddiness 05/11/2016  . History of hypertension 05/11/2016  . Hemorrhoidal skin tag 04/07/2015  .  IUD (intrauterine device) in place 05/03/2013  . Allergic rhinitis 01/11/2013  . Current smoker 01/11/2013  . Weight gain 06/26/2012  . Cervical stenosis (uterine cervix) 04/25/2012  . Anticardiolipin antibody positive   . Hyperlipidemia   . Hypertension   . History of trichomoniasis 01/18/2011    Class: History of  . THYROID NODULE, RIGHT 02/05/2008  . GERD 02/05/2008  . NECK PAIN 02/05/2008    Carney Living PT DPT  06/11/2019, 1:21 PM  [facility] [address] [place], [place], [address] Phone: [phone]   Fax:  [phone]  Name: Kelly [name] MRN: [id number] Date  of Birth: [birth year]

## 2019-06-11 NOTE — Addendum Note (Signed)
Addended by: Carney Living on: 06/11/2019 01:27 PM   Modules accepted: Orders

## 2019-06-12 ENCOUNTER — Other Ambulatory Visit: Payer: Self-pay | Admitting: Family Medicine

## 2019-06-12 ENCOUNTER — Encounter: Payer: Self-pay | Admitting: Family Medicine

## 2019-06-12 MED ORDER — HYDROCODONE-ACETAMINOPHEN 5-325 MG PO TABS: 1.0000 | ORAL_TABLET | Freq: Every evening | ORAL | 0 refills | Status: DC | PRN

## 2019-06-12 MED FILL — HYDROCODON-APAP 5-325: 5-325 | 10 days supply | Qty: 20 | Fill #0

## 2019-06-12 NOTE — Telephone Encounter (Signed)
Please advise 

## 2019-06-13 ENCOUNTER — Ambulatory Visit: Payer: 59 | Admitting: Physical Therapy

## 2019-06-13 DIAGNOSIS — G4733 Obstructive sleep apnea (adult) (pediatric): Secondary | ICD-10-CM | POA: Diagnosis not present

## 2019-06-17 ENCOUNTER — Ambulatory Visit: Payer: 59 | Admitting: Physical Therapy

## 2019-06-17 DIAGNOSIS — G959 Disease of spinal cord, unspecified: Secondary | ICD-10-CM | POA: Diagnosis not present

## 2019-06-18 MED FILL — CYCLOBENZAPRINE 5 MG TABLET: 5 | 30 days supply | Qty: 60 | Fill #1

## 2019-06-18 MED FILL — MELOXICAM 15 MG TABLET: 15 | 60 days supply | Qty: 60 | Fill #1

## 2019-06-18 MED FILL — ATORVASTATIN 20 MG TABLET: 20 | 90 days supply | Qty: 90 | Fill #1

## 2019-06-18 MED FILL — traMADol HCL 50 MG TABS: 50 | 7 days supply | Qty: 50 | Fill #1

## 2019-06-18 MED FILL — METOPROLOL TARTRATE 50 MG T: 50 | 30 days supply | Qty: 60 | Fill #0

## 2019-06-18 MED FILL — METHOCARBAMOL 500 MG TABS: 500 | 15 days supply | Qty: 60 | Fill #1

## 2019-06-18 MED FILL — TOPIRAMATE 50 MG TABLET: 50 | 30 days supply | Qty: 60 | Fill #0

## 2019-06-20 ENCOUNTER — Ambulatory Visit: Payer: 59 | Admitting: Physical Therapy

## 2019-06-20 DIAGNOSIS — G959 Disease of spinal cord, unspecified: Secondary | ICD-10-CM | POA: Diagnosis not present

## 2019-06-20 MED FILL — LIDOCAINE PATCH 5%: 5 | 10 days supply | Qty: 10 | Fill #0

## 2019-06-21 ENCOUNTER — Other Ambulatory Visit: Payer: Self-pay

## 2019-06-21 ENCOUNTER — Ambulatory Visit: Payer: 59 | Admitting: Physical Therapy

## 2019-06-21 ENCOUNTER — Encounter: Payer: Self-pay | Admitting: Physical Therapy

## 2019-06-21 DIAGNOSIS — M62838 Other muscle spasm: Secondary | ICD-10-CM

## 2019-06-21 DIAGNOSIS — M25612 Stiffness of left shoulder, not elsewhere classified: Secondary | ICD-10-CM | POA: Diagnosis not present

## 2019-06-21 DIAGNOSIS — M542 Cervicalgia: Secondary | ICD-10-CM

## 2019-06-21 DIAGNOSIS — M25512 Pain in left shoulder: Secondary | ICD-10-CM

## 2019-06-21 NOTE — Therapy (Signed)
[place], [place], [address] Phone: [phone]   Fax:  [phone]  Physical Therapy Treatment  Patient Details  Name: Kelly [name] MRN: [id number] Date of Birth: [birth year] Referring Provider (PT): Dr Joanne Chars   Encounter Date: 06/21/2019  PT End of Session - 06/21/19 1238    Visit Number  14    Number of Visits  20    Date for PT Re-Evaluation  07/09/19    Authorization Type  [facility]    PT Start Time  [id number]    PT Stop Time  1239    PT Time Calculation (min)  54 min    Activity Tolerance  Patient tolerated treatment well    Behavior During Therapy  [facility] for tasks assessed/performed       Past Medical History:  Diagnosis Date  . Anticardiolipin antibody positive   . HSV-2 (herpes simplex virus 2) infection   . Hyperlipidemia   . Hypertension    diet controlled  . Kidney stone   . Migraine   . Renal calculus, right   . Sleep apnea   . TMJ (dislocation of temporomandibular joint)     Past Surgical History:  Procedure Laterality Date  . CESAREAN SECTION     X3   LAST ONE 11-30-2004  . CYSTOSCOPY W/ RETROGRADES Right 10/29/2012   Procedure: CYSTOSCOPY WITH RETROGRADE PYELOGRAM and stent placement;  Surgeon: Reece Packer, MD;  Location: [facility];  Service: Urology;  Laterality: Right;  rt stent placement , rt retrograde and cysto   . CYSTOSCOPY W/ URETERAL STENT PLACEMENT Right 11/23/2012   Procedure: CYSTOSCOPY WITH STENT REPLACEMENT;  Surgeon: Alexis Frock, MD;  Location: [facility];  Service: Urology;  Laterality: Right;  . CYSTOSCOPY/RETROGRADE/URETEROSCOPY/STONE EXTRACTION WITH BASKET Right 11/23/2012   Procedure: CYSTOSCOPY/RETROGRADE/URETEROSCOPY/STONE EXTRACTION WITH BASKET;  Surgeon: Alexis Frock, MD;  Location: [facility];  Service: Urology;  Laterality: Right;    There were no vitals filed for this visit.  Subjective  Assessment - 06/21/19 1236    Subjective  Patient has no been seen by PT because PT was out. In that time her pain has increased significantly. She has pain radiating down into her shoulder blades. She has been to See Dr Ronnald Ramp who is going to do an ACDF of her cervical spine. She will schedule the surgery next week. She will continue with PT until her surery is scheduled.    Pertinent History  TMJ, migranes,    Limitations  Sitting    How long can you stand comfortably?  increased lower back when standing    How long can you walk comfortably?  increased pain with ambualtion    Diagnostic tests  No x-rays in the chart.    Patient Stated Goals  to have less pain    Currently in Pain?  Yes    Pain Score  6     Pain Location  Neck    Pain Orientation  Right    Pain Descriptors / Indicators  Aching    Pain Type  Chronic pain    Pain Onset  More than a month ago    Pain Frequency  Constant    Aggravating Factors   work    Pain Relieving Factors  rest    Effect of Pain on Daily Activities  difficulty perfroming work tasks  [facility] Adult PT Treatment/Exercise - 06/21/19 0001      Neck Exercises: Machines for Strengthening   UBE (Upper Arm Bike)  2 mins posterior       Neck Exercises: Standing   Other Standing Exercises  scap retraction 2x10  green  ; shoulder extension 2x10 green      Neck Exercises: Supine   Other Supine Exercise  bilateral ER red 2x10; horizontal abduction 10 red;        Moist Heat Therapy   Moist Heat Location  Cervical      Electrical Stimulation   Electrical Stimulation Location  cervical spine     Electrical Stimulation Goals  Pain      Manual Therapy   Manual Therapy  Passive ROM    Soft tissue mobilization  trigger point release to cervical parapinals, upper trap , IASTYM to upper trap and shoulder.     Manual Traction  gentle cervical traction; sub-occipital release              PT Education - 06/21/19 1238     Education Details  reviewed symptom mangement for home    Person(s) Educated  Patient    Methods  Demonstration;Explanation;Tactile cues;Verbal cues    Comprehension  Verbalized understanding;Returned demonstration;Verbal cues required;Tactile cues required       PT Short Term Goals - 06/11/19 0950      PT SHORT TERM GOAL #1   Title  Patient will be indepdent with relaxation and breathing techniques to reduce actue inflamtion. She had a minor improvement in cervical movement. She continues to have spasming in the upper traps and cervical paraspinals. She has shown improved left UE atrength as well. Therapy will proceed per MD reccomendation following MRI. If she plans on a conservative apporach we will continue to work on posutral correction and manual therapy.    Baseline  reviewed decompression position    Time  3    Period  Weeks    Status  On-going    Target Date  04/02/19      PT SHORT TERM GOAL #2   Title  Patient will report no radiating pain down her left arm    Baseline  intermitent depending on activity    Time  3    Period  Weeks    Status  On-going    Target Date  07/10/18      PT SHORT TERM GOAL #3   Title  Patient will report 3/10 pain at worst after work    Baseline  3/10 today    Time  3    Period  Weeks    Status  On-going        PT Long Term Goals - 06/11/19 1319      PT LONG TERM GOAL #1   Title  Patient will demonstrate 70 degrees of passive rotation ROM bilateral without pain    Baseline  60 bilateral    Time  6    Period  Weeks    Status  On-going      PT LONG TERM GOAL #2   Title  Patient will sit at her desk for 4 hours without pain    Baseline  incfrease pain as she sits    Time  6    Period  Weeks    Status  On-going      PT LONG TERM GOAL #3   Title  Patient will ambulate without lower back pain in order to perfrom ADL's  Baseline  PT has been focusing on her neck    Time  6    Period  Weeks    Status  On-going      PT LONG TERM  GOAL #4   Title  increase FOTO score to </= 35% limited to demo improvement in function     Baseline  not taken    Time  6    Period  Weeks    Status  On-going            Plan - 06/21/19 1240    Clinical Impression Statement  Therapy ill continue to work on manual therapy to maintain her ability to work until she has her surgery. Significant mprovement in pain noted with treatment today.    Personal Factors and Comorbidities  Comorbidity 1;Comorbidity 2;Profession    Comorbidities  history of cervical spine pain, TMJ    Examination-Activity Limitations  Lift;Reach Overhead;Locomotion Level    Examination-Participation Restrictions  [employer]    Stability/Clinical Decision Making  Evolving/Moderate complexity    Rehab Potential  Good    PT Frequency  2x / week    PT Duration  6 weeks    PT Treatment/Interventions  ADLs/Self Care Home Management;Cryotherapy;Electrical Stimulation;Iontophoresis 4mg /ml Dexamethasone;Moist Heat;Ultrasound;Traction;DME Instruction;Gait training;Stair training;Functional mobility training;Therapeutic activities;Therapeutic exercise;Neuromuscular re-education;Patient/family education;Manual techniques;Passive range of motion;Dry needling;Taping    PT Next Visit Plan  continue to progress exercises as tolerated. continue with manual therapy. Continue with manual traction. Did not respond well to mechanical traction.    PT Home Exercise Plan  leg lengthener, decompression with shoulder sink, cerivical rotation 3x in pain free range, AAROM cane; piriformis stretch, lower trunk rotation    Consulted and Agree with Plan of Care  Patient       Patient will benefit from skilled therapeutic intervention in order to improve the following deficits and impairments:  Pain, Increased muscle spasms, Decreased activity tolerance, Decreased strength, Decreased range of motion  Visit Diagnosis: Cervicalgia  Other muscle spasm  Acute pain of left shoulder  Stiffness of  left shoulder, not elsewhere classified     Problem List Patient Active Problem List   Diagnosis Date Noted  . Protrusion of cervical intervertebral disc 04/10/2019  . Spinal stenosis of cervical region 04/10/2019  . Overactive bladder 11/19/2018  . Photosensitization due to sun 11/02/2018  . Generalized pain 11/02/2018  . High risk medication use 11/02/2018  . Foot pain, bilateral 05/10/2018  . Heel pain, bilateral 05/10/2018  . Plantar fasciitis 05/10/2018  . Left knee pain 05/10/2018  . Patellofemoral pain syndrome of both knees 05/10/2018  . Generalized abdominal pain 04/17/2018  . Flank pain 04/17/2018  . Acute back pain 04/17/2018  . History of renal stone 04/17/2018  . Urinary frequency 04/17/2018  . Laceration of left hand 01/31/2018  . Decreased range of motion of finger of left hand 01/31/2018  . Localized swelling on left hand 01/31/2018  . Chronic migraine without aura without status migrainosus, not intractable 11/01/2016  . History of migraine 06/10/2016  . Diarrhea 05/20/2016  . Pain in the chest 05/11/2016  . Dizziness and giddiness 05/11/2016  . History of hypertension 05/11/2016  . Hemorrhoidal skin tag 04/07/2015  . IUD (intrauterine device) in place 05/03/2013  . Allergic rhinitis 01/11/2013  . Current smoker 01/11/2013  . Weight gain 06/26/2012  . Cervical stenosis (uterine cervix) 04/25/2012  . Anticardiolipin antibody positive   . Hyperlipidemia   . Hypertension   . History of trichomoniasis 01/18/2011    Class:  History of  . THYROID NODULE, RIGHT 02/05/2008  . GERD 02/05/2008  . NECK PAIN 02/05/2008    Carney Living PT DPT  06/21/2019, 12:42 PM  [facility] [address] [place], [place], [address] Phone: [phone]   Fax:  [phone]  Name: Kelly [name] MRN: [id number] Date of Birth: 09/19/1965

## 2019-06-25 ENCOUNTER — Other Ambulatory Visit: Payer: Self-pay

## 2019-06-25 ENCOUNTER — Ambulatory Visit: Payer: 59 | Attending: Family Medicine | Admitting: Physical Therapy

## 2019-06-25 ENCOUNTER — Encounter: Payer: Self-pay | Admitting: Physical Therapy

## 2019-06-25 DIAGNOSIS — M25512 Pain in left shoulder: Secondary | ICD-10-CM

## 2019-06-25 DIAGNOSIS — M25612 Stiffness of left shoulder, not elsewhere classified: Secondary | ICD-10-CM

## 2019-06-25 DIAGNOSIS — M542 Cervicalgia: Secondary | ICD-10-CM

## 2019-06-25 DIAGNOSIS — M62838 Other muscle spasm: Secondary | ICD-10-CM | POA: Diagnosis not present

## 2019-06-25 MED FILL — HYDROCODON-APAP 5-325: 5-325 | 7 days supply | Qty: 30 | Fill #0

## 2019-06-26 ENCOUNTER — Encounter: Payer: Self-pay | Admitting: Physical Therapy

## 2019-06-26 NOTE — Therapy (Signed)
[place], [place], [address] Phone: [phone]   Fax:  [phone]  Physical Therapy Treatment  Patient Details  Name: Kelly [name] MRN: [id number] Date of Birth: 1966-05-11 Referring Provider (PT): Dr Joanne Chars   Encounter Date: 06/25/2019  PT End of Session - 06/26/19 0957    Visit Number  15    Number of Visits  20    Date for PT Re-Evaluation  07/09/19    Authorization Type  [facility]    PT Start Time  1500    PT Stop Time  1555    PT Time Calculation (min)  55 min    Activity Tolerance  Patient tolerated treatment well       Past Medical History:  Diagnosis Date  . Anticardiolipin antibody positive   . HSV-2 (herpes simplex virus 2) infection   . Hyperlipidemia   . Hypertension    diet controlled  . Kidney stone   . Migraine   . Renal calculus, right   . Sleep apnea   . TMJ (dislocation of temporomandibular joint)     Past Surgical History:  Procedure Laterality Date  . CESAREAN SECTION     X3   LAST ONE 11-30-2004  . CYSTOSCOPY W/ RETROGRADES Right 10/29/2012   Procedure: CYSTOSCOPY WITH RETROGRADE PYELOGRAM and stent placement;  Surgeon: Reece Packer, MD;  Location: [facility];  Service: Urology;  Laterality: Right;  rt stent placement , rt retrograde and cysto   . CYSTOSCOPY W/ URETERAL STENT PLACEMENT Right 11/23/2012   Procedure: CYSTOSCOPY WITH STENT REPLACEMENT;  Surgeon: Alexis Frock, MD;  Location: [facility];  Service: Urology;  Laterality: Right;  . CYSTOSCOPY/RETROGRADE/URETEROSCOPY/STONE EXTRACTION WITH BASKET Right 11/23/2012   Procedure: CYSTOSCOPY/RETROGRADE/URETEROSCOPY/STONE EXTRACTION WITH BASKET;  Surgeon: Alexis Frock, MD;  Location: [facility];  Service: Urology;  Laterality: Right;    There were no vitals filed for this visit.  Subjective Assessment - 06/25/19 1627    Subjective  Patient reports she felt  better after the last visit. She continues to have burning down into her shoulder blades. It is worse when she sits up at work.    Pertinent History  TMJ, migranes,    Limitations  Sitting    How long can you stand comfortably?  increased lower back when standing    How long can you walk comfortably?  increased pain with ambualtion    Diagnostic tests  No x-rays in the chart.    Patient Stated Goals  to have less pain    Currently in Pain?  Yes    Pain Score  5     Pain Location  Neck    Pain Orientation  Left    Pain Descriptors / Indicators  Burning    Pain Type  Chronic pain    Pain Radiating Towards  into shoulder blades    Pain Onset  More than a month ago    Pain Frequency  Constant    Aggravating Factors   work    Pain Relieving Factors  rest    Effect of Pain on Daily Activities  difficulty perfroming work tasks    Multiple Pain Sites  No                       [facility] Adult PT Treatment/Exercise - 06/26/19 0001      Neck Exercises: Machines for Strengthening   UBE (Upper Arm Bike)  2 mins posterior       Neck Exercises: Standing   Other Standing Exercises  scap retraction 2x10  red ; shoulder extension 2x10 red      Neck Exercises: Supine   Other Supine Exercise  bilateral ER red 2x10; horizontal abduction 10 red;        Moist Heat Therapy   Moist Heat Location  Cervical      Electrical Stimulation   Electrical Stimulation Location  cervical spine     Electrical Stimulation Action  IFC    Electrical Stimulation Parameters  to tolerance     Electrical Stimulation Goals  Pain      Manual Therapy   Manual Therapy  Passive ROM    Soft tissue mobilization  trigger point release to cervical parapinals, upper trap , IASTYM to upper trap and shoulder.     Manual Traction  gentle cervical traction; sub-occipital release              PT Education - 06/26/19 0957    Education Details  posture at work    [employer]) Educated  Patient    Methods   Explanation;Demonstration;Tactile cues;Verbal cues    Comprehension  Returned demonstration;Verbalized understanding;Verbal cues required;Tactile cues required       PT Short Term Goals - 06/11/19 0950      PT SHORT TERM GOAL #1   Title  Patient will be indepdent with relaxation and breathing techniques to reduce actue inflamtion. She had a minor improvement in cervical movement. She continues to have spasming in the upper traps and cervical paraspinals. She has shown improved left UE atrength as well. Therapy will proceed per MD reccomendation following MRI. If she plans on a conservative apporach we will continue to work on posutral correction and manual therapy.    Baseline  reviewed decompression position    Time  3    Period  Weeks    Status  On-going    Target Date  04/02/19      PT SHORT TERM GOAL #2   Title  Patient will report no radiating pain down her left arm    Baseline  intermitent depending on activity    Time  3    Period  Weeks    Status  On-going    Target Date  07/10/18      PT SHORT TERM GOAL #3   Title  Patient will report 3/10 pain at worst after work    Baseline  3/10 today    Time  3    Period  Weeks    Status  On-going        PT Long Term Goals - 06/11/19 1319      PT LONG TERM GOAL #1   Title  Patient will demonstrate 70 degrees of passive rotation ROM bilateral without pain    Baseline  60 bilateral    Time  6    Period  Weeks    Status  On-going      PT LONG TERM GOAL #2   Title  Patient will sit at her desk for 4 hours without pain    Baseline  incfrease pain as she sits    Time  6    Period  Weeks    Status  On-going      PT LONG TERM GOAL #3   Title  Patient will ambulate without lower back pain in order to perfrom ADL's    Baseline  PT has been focusing on  her neck    Time  6    Period  Weeks    Status  On-going      PT LONG TERM GOAL #4   Title  increase FOTO score to </= 35% limited to demo improvement in function      Baseline  not taken    Time  6    Period  Weeks    Status  On-going            Plan - 06/26/19 0958    Clinical Impression Statement  Patient continues to find benefit from naul therapy. She was advised to continue with light postural exercises. She had no increase in pain.    Personal Factors and Comorbidities  Comorbidity 1;Comorbidity 2;Profession    Comorbidities  history of cervical spine pain, TMJ    Examination-Activity Limitations  Lift;Reach Overhead;Locomotion Level    Stability/Clinical Decision Making  Evolving/Moderate complexity    Clinical Decision Making  Low    Rehab Potential  Good    PT Frequency  2x / week    PT Duration  6 weeks    PT Treatment/Interventions  ADLs/Self Care Home Management;Cryotherapy;Electrical Stimulation;Iontophoresis 4mg /ml Dexamethasone;Moist Heat;Ultrasound;Traction;DME Instruction;Gait training;Stair training;Functional mobility training;Therapeutic activities;Therapeutic exercise;Neuromuscular re-education;Patient/family education;Manual techniques;Passive range of motion;Dry needling;Taping    PT Next Visit Plan  continue to progress exercises as tolerated. continue with manual therapy. Continue with manual traction. Did not respond well to mechanical traction.    PT Home Exercise Plan  leg lengthener, decompression with shoulder sink, cerivical rotation 3x in pain free range, AAROM cane; piriformis stretch, lower trunk rotation    Consulted and Agree with Plan of Care  Patient       Patient will benefit from skilled therapeutic intervention in order to improve the following deficits and impairments:  Pain, Increased muscle spasms, Decreased activity tolerance, Decreased strength, Decreased range of motion  Visit Diagnosis: Cervicalgia  Other muscle spasm  Acute pain of left shoulder  Stiffness of left shoulder, not elsewhere classified     Problem List Patient Active Problem List   Diagnosis Date Noted  . Protrusion of  cervical intervertebral disc 04/10/2019  . Spinal stenosis of cervical region 04/10/2019  . Overactive bladder 11/19/2018  . Photosensitization due to sun 11/02/2018  . Generalized pain 11/02/2018  . High risk medication use 11/02/2018  . Foot pain, bilateral 05/10/2018  . Heel pain, bilateral 05/10/2018  . Plantar fasciitis 05/10/2018  . Left knee pain 05/10/2018  . Patellofemoral pain syndrome of both knees 05/10/2018  . Generalized abdominal pain 04/17/2018  . Flank pain 04/17/2018  . Acute back pain 04/17/2018  . History of renal stone 04/17/2018  . Urinary frequency 04/17/2018  . Laceration of left hand 01/31/2018  . Decreased range of motion of finger of left hand 01/31/2018  . Localized swelling on left hand 01/31/2018  . Chronic migraine without aura without status migrainosus, not intractable 11/01/2016  . History of migraine 06/10/2016  . Diarrhea 05/20/2016  . Pain in the chest 05/11/2016  . Dizziness and giddiness 05/11/2016  . History of hypertension 05/11/2016  . Hemorrhoidal skin tag 04/07/2015  . IUD (intrauterine device) in place 05/03/2013  . Allergic rhinitis 01/11/2013  . Current smoker 01/11/2013  . Weight gain 06/26/2012  . Cervical stenosis (uterine cervix) 04/25/2012  . Anticardiolipin antibody positive   . Hyperlipidemia   . Hypertension   . History of trichomoniasis 01/18/2011    Class: History of  . THYROID NODULE, RIGHT 02/05/2008  .  GERD 02/05/2008  . NECK PAIN 02/05/2008    Carney Living PT DPT  06/26/2019, 11:10 AM  [facility] [address] [place], [place], [address] Phone: [phone]   Fax:  [phone]  Name: TENEISHA SHULER MRN: [id number] Date of Birth: 1965-11-15

## 2019-07-02 ENCOUNTER — Ambulatory Visit: Payer: 59 | Admitting: Physical Therapy

## 2019-07-02 ENCOUNTER — Other Ambulatory Visit: Payer: Self-pay

## 2019-07-02 ENCOUNTER — Encounter: Payer: Self-pay | Admitting: Physical Therapy

## 2019-07-02 DIAGNOSIS — M25512 Pain in left shoulder: Secondary | ICD-10-CM

## 2019-07-02 DIAGNOSIS — M25612 Stiffness of left shoulder, not elsewhere classified: Secondary | ICD-10-CM

## 2019-07-02 DIAGNOSIS — M62838 Other muscle spasm: Secondary | ICD-10-CM

## 2019-07-02 DIAGNOSIS — M542 Cervicalgia: Secondary | ICD-10-CM | POA: Diagnosis not present

## 2019-07-03 NOTE — Therapy (Signed)
[place], [place], [address] Phone: [phone]   Fax:  [phone]  Physical Therapy Treatment  Patient Details  Name: Kelly [name] MRN: [id number] Date of Birth: 1966-07-13 Referring Provider (PT): Dr Joanne Chars   Encounter Date: 07/02/2019  PT End of Session - 07/03/19 1013    Visit Number  16    Number of Visits  20    Date for PT Re-Evaluation  07/09/19    Authorization Type  [facility]    PT Start Time  1500    PT Stop Time  1557    PT Time Calculation (min)  57 min    Activity Tolerance  Patient tolerated treatment well    Behavior During Therapy  [facility] for tasks assessed/performed       Past Medical History:  Diagnosis Date  . Anticardiolipin antibody positive   . HSV-2 (herpes simplex virus 2) infection   . Hyperlipidemia   . Hypertension    diet controlled  . Kidney stone   . Migraine   . Renal calculus, right   . Sleep apnea   . TMJ (dislocation of temporomandibular joint)     Past Surgical History:  Procedure Laterality Date  . CESAREAN SECTION     X3   LAST ONE 11-30-2004  . CYSTOSCOPY W/ RETROGRADES Right 10/29/2012   Procedure: CYSTOSCOPY WITH RETROGRADE PYELOGRAM and stent placement;  Surgeon: Reece Packer, MD;  Location: [facility];  Service: Urology;  Laterality: Right;  rt stent placement , rt retrograde and cysto   . CYSTOSCOPY W/ URETERAL STENT PLACEMENT Right 11/23/2012   Procedure: CYSTOSCOPY WITH STENT REPLACEMENT;  Surgeon: Alexis Frock, MD;  Location: [facility];  Service: Urology;  Laterality: Right;  . CYSTOSCOPY/RETROGRADE/URETEROSCOPY/STONE EXTRACTION WITH BASKET Right 11/23/2012   Procedure: CYSTOSCOPY/RETROGRADE/URETEROSCOPY/STONE EXTRACTION WITH BASKET;  Surgeon: Alexis Frock, MD;  Location: [facility];  Service: Urology;  Laterality: Right;    There were no vitals filed for this visit.  Subjective  Assessment - 07/02/19 1607    Subjective  Patient feels good today. Yesterday she had increased pain down into her shoulders and mumbness in her hand    Pertinent History  TMJ, migranes,    Limitations  Sitting    How long can you stand comfortably?  increased lower back when standing    How long can you walk comfortably?  increased pain with ambualtion    Diagnostic tests  No x-rays in the chart.    Patient Stated Goals  to have less pain    Currently in Pain?  Yes    Pain Score  5     Pain Location  Neck    Pain Orientation  Left;Right    Pain Descriptors / Indicators  Aching    Pain Type  Chronic pain    Pain Radiating Towards  into shoulder blades    Pain Onset  More than a month ago    Pain Frequency  Constant    Aggravating Factors   work    Pain Relieving Factors  rest    Multiple Pain Sites  No                               PT Education - 07/02/19 1621    Education Details  technique with exercises    Person(s) Educated  Patient    Methods  Explanation;Demonstration;Tactile cues;Verbal  cues    Comprehension  Verbalized understanding;Returned demonstration;Verbal cues required;Tactile cues required       PT Short Term Goals - 07/03/19 1015      PT SHORT TERM GOAL #1   Title  Patient will be indepdent with relaxation and breathing techniques to reduce actue inflamtion.    Baseline  independent    Time  3    Period  Weeks    Status  Achieved    Target Date  04/02/19      PT SHORT TERM GOAL #2   Title  Patient will report no radiating pain down her left arm    Baseline  intermitent depending on activity    Time  3    Period  Weeks    Status  Achieved    Target Date  07/10/18      PT SHORT TERM GOAL #3   Title  Patient will report 3/10 pain at worst after work    Baseline  3/10 today    Time  3    Period  Weeks    Status  Achieved    Target Date  05/10/19        PT Long Term Goals - 06/11/19 1319      PT LONG TERM GOAL #1    Title  Patient will demonstrate 70 degrees of passive rotation ROM bilateral without pain    Baseline  60 bilateral    Time  6    Period  Weeks    Status  On-going      PT LONG TERM GOAL #2   Title  Patient will sit at her desk for 4 hours without pain    Baseline  incfrease pain as she sits    Time  6    Period  Weeks    Status  On-going      PT LONG TERM GOAL #3   Title  Patient will ambulate without lower back pain in order to perfrom ADL's    Baseline  PT has been focusing on her neck    Time  6    Period  Weeks    Status  On-going      PT LONG TERM GOAL #4   Title  increase FOTO score to </= 35% limited to demo improvement in function     Baseline  not taken    Time  6    Period  Weeks    Status  On-going            Plan - 07/02/19 1623    Clinical Impression Statement  Patient tolerated treatment well. she has significant spasming in her upper traps. She felt better with treatment. therapy continues to emphasize poisture with ther-ex. She feels like therapy is keeping her at a place where she can work without severe pain.    Personal Factors and Comorbidities  Comorbidity 1;Comorbidity 2;Profession    Comorbidities  history of cervical spine pain, TMJ    Examination-Activity Limitations  Lift;Reach Overhead;Locomotion Level    Stability/Clinical Decision Making  Evolving/Moderate complexity    Clinical Decision Making  Low    Rehab Potential  Good    PT Frequency  2x / week    PT Duration  6 weeks    PT Treatment/Interventions  ADLs/Self Care Home Management;Cryotherapy;Electrical Stimulation;Iontophoresis 4mg /ml Dexamethasone;Moist Heat;Ultrasound;Traction;DME Instruction;Gait training;Stair training;Functional mobility training;Therapeutic activities;Therapeutic exercise;Neuromuscular re-education;Patient/family education;Manual techniques;Passive range of motion;Dry needling;Taping    PT Next Visit Plan  continue to progress exercises as  tolerated. continue with  manual therapy. Continue with manual traction. Did not respond well to mechanical traction.    PT Home Exercise Plan  leg lengthener, decompression with shoulder sink, cerivical rotation 3x in pain free range, AAROM cane; piriformis stretch, lower trunk rotation    Consulted and Agree with Plan of Care  Patient       Patient will benefit from skilled therapeutic intervention in order to improve the following deficits and impairments:  Pain, Increased muscle spasms, Decreased activity tolerance, Decreased strength, Decreased range of motion  Visit Diagnosis: Cervicalgia  Other muscle spasm  Acute pain of left shoulder  Stiffness of left shoulder, not elsewhere classified     Problem List Patient Active Problem List   Diagnosis Date Noted  . Protrusion of cervical intervertebral disc 04/10/2019  . Spinal stenosis of cervical region 04/10/2019  . Overactive bladder 11/19/2018  . Photosensitization due to sun 11/02/2018  . Generalized pain 11/02/2018  . High risk medication use 11/02/2018  . Foot pain, bilateral 05/10/2018  . Heel pain, bilateral 05/10/2018  . Plantar fasciitis 05/10/2018  . Left knee pain 05/10/2018  . Patellofemoral pain syndrome of both knees 05/10/2018  . Generalized abdominal pain 04/17/2018  . Flank pain 04/17/2018  . Acute back pain 04/17/2018  . History of renal stone 04/17/2018  . Urinary frequency 04/17/2018  . Laceration of left hand 01/31/2018  . Decreased range of motion of finger of left hand 01/31/2018  . Localized swelling on left hand 01/31/2018  . Chronic migraine without aura without status migrainosus, not intractable 11/01/2016  . History of migraine 06/10/2016  . Diarrhea 05/20/2016  . Pain in the chest 05/11/2016  . Dizziness and giddiness 05/11/2016  . History of hypertension 05/11/2016  . Hemorrhoidal skin tag 04/07/2015  . IUD (intrauterine device) in place 05/03/2013  . Allergic rhinitis 01/11/2013  . Current smoker 01/11/2013   . Weight gain 06/26/2012  . Cervical stenosis (uterine cervix) 04/25/2012  . Anticardiolipin antibody positive   . Hyperlipidemia   . Hypertension   . History of trichomoniasis 01/18/2011    Class: History of  . THYROID NODULE, RIGHT 02/05/2008  . GERD 02/05/2008  . NECK PAIN 02/05/2008    Carney Living PT DPT 07/03/2019, 10:17 AM  [facility] [address] [place], [place], [address] Phone: [phone]   Fax:  [phone]  Name: Kelly [name] MRN: [id number] Date of Birth: 06-23-1966

## 2019-07-04 ENCOUNTER — Ambulatory Visit: Payer: 59 | Admitting: Physical Therapy

## 2019-07-04 ENCOUNTER — Other Ambulatory Visit: Payer: Self-pay

## 2019-07-04 ENCOUNTER — Encounter: Payer: Self-pay | Admitting: Physical Therapy

## 2019-07-04 DIAGNOSIS — M62838 Other muscle spasm: Secondary | ICD-10-CM | POA: Diagnosis not present

## 2019-07-04 DIAGNOSIS — M542 Cervicalgia: Secondary | ICD-10-CM

## 2019-07-04 DIAGNOSIS — M25512 Pain in left shoulder: Secondary | ICD-10-CM | POA: Diagnosis not present

## 2019-07-04 DIAGNOSIS — M25612 Stiffness of left shoulder, not elsewhere classified: Secondary | ICD-10-CM | POA: Diagnosis not present

## 2019-07-05 MED FILL — HYDROCODON-APAP 5-325: 5-325 | 7 days supply | Qty: 30 | Fill #0

## 2019-07-05 NOTE — Therapy (Addendum)
[place], [place], [address] Phone: [phone]   Fax:  [phone]  Physical Therapy Treatment/Discharge   Patient Details  Name: Kelly [name] MRN: [id number] Date of Birth: [birth year] Referring Provider (PT): Dr Joanne Chars   Encounter Date: 07/04/2019  PT End of Session - 07/05/19 1115    Visit Number  17    Number of Visits  20    Date for PT Re-Evaluation  07/09/19    Authorization Type  [facility]    PT Start Time  8938    PT Stop Time  1640    PT Time Calculation (min)  55 min    Activity Tolerance  Patient tolerated treatment well    Behavior During Therapy  [facility] for tasks assessed/performed       Past Medical History:  Diagnosis Date  . Anticardiolipin antibody positive   . HSV-2 (herpes simplex virus 2) infection   . Hyperlipidemia   . Hypertension    diet controlled  . Kidney stone   . Migraine   . Renal calculus, right   . Sleep apnea   . TMJ (dislocation of temporomandibular joint)     Past Surgical History:  Procedure Laterality Date  . CESAREAN SECTION     X3   LAST ONE 11-30-2004  . CYSTOSCOPY W/ RETROGRADES Right 10/29/2012   Procedure: CYSTOSCOPY WITH RETROGRADE PYELOGRAM and stent placement;  Surgeon: Reece Packer, MD;  Location: [facility];  Service: Urology;  Laterality: Right;  rt stent placement , rt retrograde and cysto   . CYSTOSCOPY W/ URETERAL STENT PLACEMENT Right 11/23/2012   Procedure: CYSTOSCOPY WITH STENT REPLACEMENT;  Surgeon: Alexis Frock, MD;  Location: [facility];  Service: Urology;  Laterality: Right;  . CYSTOSCOPY/RETROGRADE/URETEROSCOPY/STONE EXTRACTION WITH BASKET Right 11/23/2012   Procedure: CYSTOSCOPY/RETROGRADE/URETEROSCOPY/STONE EXTRACTION WITH BASKET;  Surgeon: Alexis Frock, MD;  Location: [facility];  Service: Urology;  Laterality: Right;    There were no vitals filed for this  visit.  Subjective Assessment - 07/04/19 1611    Subjective  Patient is feeling better today. She hasn't had any increase in pain or numbness ver the past few days.    Pertinent History  TMJ, migranes,    Limitations  Sitting    How long can you stand comfortably?  increased lower back when standing    How long can you walk comfortably?  increased pain with ambualtion    Diagnostic tests  No x-rays in the chart.    Patient Stated Goals  to have less pain    Currently in Pain?  Yes    Pain Score  4     Pain Location  Neck    Pain Orientation  Right;Left    Pain Descriptors / Indicators  Aching    Pain Type  Chronic pain    Pain Onset  More than a month ago    Pain Frequency  Constant    Aggravating Factors   work    Pain Relieving Factors  rest    Effect of Pain on Daily Activities  difficulty perfroming work tasks    Multiple Pain Sites  No    Pain Score  6    Pain Location  Back    Pain Orientation  Right    Pain Descriptors / Indicators  Aching    Pain Type  Chronic pain    Pain Onset  More than a month ago  Pain Frequency  Constant    Aggravating Factors   use of her arms at work                       [facility] Adult PT Treatment/Exercise - 07/05/19 0001      Neck Exercises: Standing   Other Standing Exercises  scap retraction 2x10  red ; shoulder extension 2x10 red      Neck Exercises: Seated   Other Seated Exercise  bilateral ER 2x10; bilateral horzontal abduction 2x10 yellow       Manual Therapy   Manual Therapy  Passive ROM    Soft tissue mobilization  trigger point release to cervical parapinals, upper trap , IASTYM to upper trap and shoulder.     Manual Traction  gentle cervical traction; sub-occipital release              PT Education - 07/05/19 1115    Education Details  reviewed postural ther-ex    Person(s) Educated  Patient    Methods  Explanation;Demonstration;Verbal cues;Tactile cues    Comprehension  Verbalized  understanding;Returned demonstration;Verbal cues required;Tactile cues required       PT Short Term Goals - 07/03/19 1015      PT SHORT TERM GOAL #1   Title  Patient will be indepdent with relaxation and breathing techniques to reduce actue inflamtion.    Baseline  independent    Time  3    Period  Weeks    Status  Achieved    Target Date  04/02/19      PT SHORT TERM GOAL #2   Title  Patient will report no radiating pain down her left arm    Baseline  intermitent depending on activity    Time  3    Period  Weeks    Status  Achieved    Target Date  07/10/18      PT SHORT TERM GOAL #3   Title  Patient will report 3/10 pain at worst after work    Baseline  3/10 today    Time  3    Period  Weeks    Status  Achieved    Target Date  05/10/19        PT Long Term Goals - 06/11/19 1319      PT LONG TERM GOAL #1   Title  Patient will demonstrate 70 degrees of passive rotation ROM bilateral without pain    Baseline  60 bilateral    Time  6    Period  Weeks    Status  On-going      PT LONG TERM GOAL #2   Title  Patient will sit at her desk for 4 hours without pain    Baseline  incfrease pain as she sits    Time  6    Period  Weeks    Status  On-going      PT LONG TERM GOAL #3   Title  Patient will ambulate without lower back pain in order to perfrom ADL's    Baseline  PT has been focusing on her neck    Time  6    Period  Weeks    Status  On-going      PT LONG TERM GOAL #4   Title  increase FOTO score to </= 35% limited to demo improvement in function     Baseline  not taken    Time  6    Period  Weeks  Status  On-going            Plan - 07/05/19 1117    Clinical Impression Statement  Therapy reviewed postural exercises with the patient tday. She is tolerating the postural strengthening well she was strongly advised to follow symptoms. Therapy will continue over the next 2 weeks in prperation for her surgery.    Personal Factors and Comorbidities   Comorbidity 1;Comorbidity 2;Profession    Comorbidities  history of cervical spine pain, TMJ    Examination-Activity Limitations  Lift;Reach Overhead;Locomotion Level    Examination-Participation Restrictions  [employer]    Stability/Clinical Decision Making  Evolving/Moderate complexity    Clinical Decision Making  Low    Rehab Potential  Good    PT Frequency  2x / week    PT Duration  6 weeks    PT Treatment/Interventions  ADLs/Self Care Home Management;Cryotherapy;Electrical Stimulation;Iontophoresis '4mg'$ /ml Dexamethasone;Moist Heat;Ultrasound;Traction;DME Instruction;Gait training;Stair training;Functional mobility training;Therapeutic activities;Therapeutic exercise;Neuromuscular re-education;Patient/family education;Manual techniques;Passive range of motion;Dry needling;Taping    PT Next Visit Plan  continue to progress exercises as tolerated. continue with manual therapy. Continue with manual traction. Did not respond well to mechanical traction.    Consulted and Agree with Plan of Care  Patient       Patient will benefit from skilled therapeutic intervention in order to improve the following deficits and impairments:  Pain, Increased muscle spasms, Decreased activity tolerance, Decreased strength, Decreased range of motion  Visit Diagnosis: Cervicalgia  Other muscle spasm  Acute pain of left shoulder     Problem List Patient Active Problem List   Diagnosis Date Noted  . Protrusion of cervical intervertebral disc 04/10/2019  . Spinal stenosis of cervical region 04/10/2019  . Overactive bladder 11/19/2018  . Photosensitization due to sun 11/02/2018  . Generalized pain 11/02/2018  . High risk medication use 11/02/2018  . Foot pain, bilateral 05/10/2018  . Heel pain, bilateral 05/10/2018  . Plantar fasciitis 05/10/2018  . Left knee pain 05/10/2018  . Patellofemoral pain syndrome of both knees 05/10/2018  . Generalized abdominal pain 04/17/2018  . Flank pain 04/17/2018  .  Acute back pain 04/17/2018  . History of renal stone 04/17/2018  . Urinary frequency 04/17/2018  . Laceration of left hand 01/31/2018  . Decreased range of motion of finger of left hand 01/31/2018  . Localized swelling on left hand 01/31/2018  . Chronic migraine without aura without status migrainosus, not intractable 11/01/2016  . History of migraine 06/10/2016  . Diarrhea 05/20/2016  . Pain in the chest 05/11/2016  . Dizziness and giddiness 05/11/2016  . History of hypertension 05/11/2016  . Hemorrhoidal skin tag 04/07/2015  . IUD (intrauterine device) in place 05/03/2013  . Allergic rhinitis 01/11/2013  . Current smoker 01/11/2013  . Weight gain 06/26/2012  . Cervical stenosis (uterine cervix) 04/25/2012  . Anticardiolipin antibody positive   . Hyperlipidemia   . Hypertension   . History of trichomoniasis 01/18/2011    Class: History of  . THYROID NODULE, RIGHT 02/05/2008  . GERD 02/05/2008  . NECK PAIN 02/05/2008   PHYSICAL THERAPY DISCHARGE SUMMARY  Visits from Start of Care: 17 Current functional level related to goals / functional outcomes: Continued pain. Patient to have surgery    Remaining deficits: Continued pain    Education / Equipment: HEP and symptom management  Plan: Patient agrees to discharge.  Patient goals were not met. Patient is being discharged due to a change in medical status.  ?????      Carney Living PT  DPT  07/05/2019, 11:23 AM  [facility] [address] [place], [place], [address] Phone: [phone]   Fax:  [phone]  Name: Kelly [name] MRN: [id number] Date of Birth: 12/16/1965

## 2019-07-10 ENCOUNTER — Ambulatory Visit: Payer: 59 | Admitting: Physical Therapy

## 2019-07-12 ENCOUNTER — Ambulatory Visit (INDEPENDENT_AMBULATORY_CARE_PROVIDER_SITE_OTHER): Payer: 59 | Admitting: Medical

## 2019-07-12 ENCOUNTER — Other Ambulatory Visit: Payer: Self-pay

## 2019-07-12 ENCOUNTER — Encounter: Payer: Self-pay | Admitting: Medical

## 2019-07-12 VITALS — BP 132/80 | HR 88 | Temp 99.1°F | Ht 64.0 in | Wt 176.0 lb

## 2019-07-12 DIAGNOSIS — Z6379 Other stressful life events affecting family and household: Secondary | ICD-10-CM | POA: Diagnosis not present

## 2019-07-12 DIAGNOSIS — Z716 Tobacco abuse counseling: Secondary | ICD-10-CM | POA: Diagnosis not present

## 2019-07-12 DIAGNOSIS — M4802 Spinal stenosis, cervical region: Secondary | ICD-10-CM | POA: Diagnosis not present

## 2019-07-12 DIAGNOSIS — Z636 Dependent relative needing care at home: Secondary | ICD-10-CM

## 2019-07-12 DIAGNOSIS — Z79899 Other long term (current) drug therapy: Secondary | ICD-10-CM

## 2019-07-12 DIAGNOSIS — F17200 Nicotine dependence, unspecified, uncomplicated: Secondary | ICD-10-CM

## 2019-07-12 MED ORDER — ESCITALOPRAM OXALATE 10 MG PO TABS: 10.0000 mg | ORAL_TABLET | Freq: Every day | ORAL | 1 refills | Status: DC

## 2019-07-12 MED FILL — ESCITALOPRAM 10 MG TABLET: 10 | 30 days supply | Qty: 30 | Fill #0

## 2019-07-12 NOTE — Progress Notes (Signed)
Subjective: Chief Complaint  Patient presents with  . Consult   Here for consult on several concerns. Under a lot of stress lately. She has major neck surgery coming up on December 12. Currently her 52 year old daughter lives with her and her adult son who is on disability also lives with her.  For over a year now she has been dealing with her ex baby's father Kelly [name] in regards to custody of her daughter.  He has primary custody.  She battled with lawyers and the courts last year over custody.  She notes that Kelly [name] is an alcoholic and regularly drinks and drives with her daughter Kelly [name] in the car.  Despite those issues he was somehow able to gain primary custody.  At one point he tried to get complete custody away from her altogether.  They have had a tumultuous relationship where Kelly [name] does things just for spite.  So to make him happy into ensure she has time with her daughter, she has been cleaning Kelly [name]'s house and been buying him groceries regularly for the past year just to appease him.  She is currently seeing her daughter regularly.  She is already stressed out about her upcoming surgery.  She tends to be the one who takes care of her family including doing most of the cooking and cleaning providing for everybody at home as her adult son Kelly [name] does not work and is on disability.  He has his own mental health issues and history of addiction and drug abuse.  And Kelly [name] who is 52 years old relies on mother for a lot of her basic needs as well.  So with the upcoming surgery she has been particular stressed about the household maintaining this smooth transition  To make matters worse though her neurosurgeon asked her to get off tobacco before he would begin surgery.  So this consult today was initially for smoking cessation counseling and medications to help quit smoking.  However within the last 2 days her son Kelly [name] apparently bought some pills off of someone and overdosed causing a  stroke.  He did survive but did have some left arm neurological deficit and weakness.  It scared him quite a bit.  Kelly [name] notes that he has done a residential treatment program in the past.  He has continued to see psychiatry regularly and still sees Kelly [name] regularly.   Her son Kelly [name] is on disability and has not been able to live alone and maintain his own household.  He stays depressed despite numerous trials of medications in the past despite numerous therapies and is also had his own struggles with addictions for years.   With this new news of overdose this week, her ex Kelly [name] got word of this and has threatened to once again go back to the courts to try to take complete custody of Kelly [name] away from Kelly [name].    Thus she is distraught, very upset about this.  Her neurosurgeon is Dr. Sherley [name]  Her other older daughter Kelly [name] moved out at the first of this past year and is living with her boyfriend doing okay.  ROS as in subjective    Objective BP 132/80   Pulse 88   Temp 99.1 F (37.3 C)   Ht 5\' 4"  (1.626 m)   Wt 176 lb (79.8 kg)   SpO2 98%   BMI 30.21 kg/m   Gen - tearful, upset, but answers questions appropriately    Assessment: Encounter Diagnoses  Name Primary?  . Stressful life  event affecting family Yes  . Current smoker   . Spinal stenosis of cervical region   . High risk medication use   . Caregiver burden   . Tobacco abuse counseling     Plan: I know you are going through a lot right now.   Recommendations: I recommend you call the employee assistance program for counseling right away  Begin back on Lexapro immediately to help with mood  I recommend you contact your neurosurgeon to let them know about your current predicament and that although you really want to quit smoking, there is a lot of issue you are dealing with currently  I recommend you reach out to friends and family and coworkers to help with meals and assistance during the next few weeks  as you have your surgery and post op period  I recommend Kelly [name] work to help clean the house, wash close, wash dishes, help cook over the next few weeks as you recover from your surgery  I strongly recommend you consider finding a way to get your son into either a residential treatment facility again, or find a way to have him in an independent setting.  He has to take some ownership and responsibility for his actions.  You can't carry all of his burdens for him.   That does not mean you do not love him, and that does not mean you do not want to care for him.  However he needs to develop some independence, and there has to be some way in which you can live your life independently in peace in your own household.   Given the legal issues surrounding custody right now, his actions are jeopardizing your situation with your daughter.  Do not blame yourself for him taking some pills this week.  That is not your fault.  I recommend you get another opinion from a different attorney about custody and rights.  A friend gave me the name of this attorney, Kelly [name] [phone].   The following resources or residential treatment facilities for alcohol and drug addiction for your son.  [place], [name], [name] [address] [phone]  [facility]) [place], [place], [place] [address] [phone]  [facility] [address], [place], [place] [address] [phone]  [facility] [place], [place], [place] [address] [phone]   Kelly [name] was seen today for consult.  Diagnoses and all orders for this visit:  Stressful life event affecting family  Current smoker  Spinal stenosis of cervical region  High risk medication use  Caregiver burden  Tobacco abuse counseling  Other orders -     escitalopram (LEXAPRO) 10 MG tablet; Take 1 tablet (10 mg total) by mouth  daily.

## 2019-07-12 NOTE — Patient Instructions (Addendum)
I know you are going through a lot right now.   Recommendations: I recommend you call the employee assistance program for counseling right away  Begin back on Lexapro immediately to help with mood  I recommend you contact your neurosurgeon to let them know about your current predicament and that although you really want to quit smoking, there is a lot of issue you are dealing with currently  I recommend you reach out to friends and family and coworkers to help with meals and assistance during the next few weeks as you have your surgery and post op period  I recommend Annalyse work to help clean the house, wash close, wash dishes, help cook over the next few weeks as you recover from your surgery  I strongly recommend you consider finding a way to get your son into either a residential treatment facility again, or find a way to have him in an independent setting.  He has to take some ownership and responsibility for his actions.  You can't carry all of his burdens for him.   That does not mean you do not love him, and that does not mean you do not want to care for him.  However he needs to develop some independence, and there has to be some way in which you can live your life independently in peace in your own household.   Given the legal issues surrounding custody right now, his actions are jeopardizing your situation with your daughter.  Do not blame yourself for him taking some pills this week.  That is not your fault.  I recommend you get another opinion from a different attorney about custody and rights.  A friend gave me the name of this attorney, [place] [phone].   The following resources or residential treatment facilities for alcohol and drug addiction for your son.  [place], [place], [place] [address] [phone]  [facility]) [place], [place], [place] [address] [phone]  [facility] [address], [place], [place] [address] [phone]  [facility] [address] [place], [place] [address] [phone]

## 2019-07-15 ENCOUNTER — Other Ambulatory Visit: Payer: 59

## 2019-07-15 ENCOUNTER — Other Ambulatory Visit: Payer: Self-pay

## 2019-07-15 DIAGNOSIS — Z01812 Encounter for preprocedural laboratory examination: Secondary | ICD-10-CM

## 2019-07-16 LAB — CBC WITH DIFFERENTIAL/PLATELET
Basophils Absolute: 0.1 10*3/uL (ref 0.0–0.2)
Basos: 1 %
EOS (ABSOLUTE): 0.1 10*3/uL (ref 0.0–0.4)
Eos: 1 %
Hematocrit: 37.5 % (ref 34.0–46.6)
Hemoglobin: 12.9 g/dL (ref 11.1–15.9)
Immature Grans (Abs): 0 10*3/uL (ref 0.0–0.1)
Immature Granulocytes: 0 %
Lymphocytes Absolute: 2.3 10*3/uL (ref 0.7–3.1)
Lymphs: 32 %
MCH: 31.6 pg (ref 26.6–33.0)
MCHC: 34.4 g/dL (ref 31.5–35.7)
MCV: 92 fL (ref 79–97)
Monocytes Absolute: 0.5 10*3/uL (ref 0.1–0.9)
Monocytes: 7 %
Neutrophils Absolute: 4.3 10*3/uL (ref 1.4–7.0)
Neutrophils: 59 %
Platelets: 316 10*3/uL (ref 150–450)
RBC: 4.08 x10E6/uL (ref 3.77–5.28)
RDW: 11.8 % (ref 11.7–15.4)
WBC: 7.2 10*3/uL (ref 3.4–10.8)

## 2019-07-16 LAB — BASIC METABOLIC PANEL
BUN/Creatinine Ratio: 14 (ref 9–23)
BUN: 12 mg/dL (ref 6–24)
CO2: 20 mmol/L (ref 20–29)
Calcium: 9.3 mg/dL (ref 8.7–10.2)
Chloride: 105 mmol/L (ref 96–106)
Creatinine, Ser: 0.87 mg/dL (ref 0.57–1.00)
GFR calc Af Amer: 89 mL/min/{1.73_m2} (ref >59)
GFR calc non Af Amer: 77 mL/min/{1.73_m2} (ref >59)
Glucose: 93 mg/dL (ref 65–99)
Potassium: 4.1 mmol/L (ref 3.5–5.2)
Sodium: 141 mmol/L (ref 134–144)

## 2019-07-16 MED FILL — HYDROCODON-APAP 5-325: 5-325 | 7 days supply | Qty: 30 | Fill #0

## 2019-07-16 MED FILL — CYCLOBENZAPRINE 5 MG TABLET: 5 | 30 days supply | Qty: 60 | Fill #1

## 2019-07-16 MED FILL — METHOCARBAMOL 500 MG TABS: 500 | 15 days supply | Qty: 60 | Fill #2

## 2019-07-16 MED FILL — METOPROLOL TARTRATE 50 MG T: 50 | 30 days supply | Qty: 60 | Fill #1

## 2019-07-17 DIAGNOSIS — Z1159 Encounter for screening for other viral diseases: Secondary | ICD-10-CM | POA: Diagnosis not present

## 2019-07-23 MED FILL — HYDROCODON-APAP 5-325: 5-325 | 8 days supply | Qty: 30 | Fill #0

## 2019-07-24 DIAGNOSIS — M5412 Radiculopathy, cervical region: Secondary | ICD-10-CM | POA: Diagnosis not present

## 2019-07-24 DIAGNOSIS — M50021 Cervical disc disorder at C4-C5 level with myelopathy: Secondary | ICD-10-CM | POA: Diagnosis not present

## 2019-07-24 DIAGNOSIS — M4802 Spinal stenosis, cervical region: Secondary | ICD-10-CM | POA: Diagnosis not present

## 2019-07-24 DIAGNOSIS — G992 Myelopathy in diseases classified elsewhere: Secondary | ICD-10-CM | POA: Diagnosis not present

## 2019-07-26 MED FILL — METHYLPREDNISOLONE 4 MG TAB: 4 | 6 days supply | Qty: 21 | Fill #0

## 2019-07-26 MED FILL — OXYCODONE-ACETAMINOPHEN 5-3: 5-325 | 7 days supply | Qty: 30 | Fill #0

## 2019-07-29 MED FILL — METHOCARBAMOL 500 MG TABS: 500 | 10 days supply | Qty: 30 | Fill #0

## 2019-08-05 MED FILL — OXYCODONE-ACETAMINOPHEN 5-3: 5-325 | 8 days supply | Qty: 30 | Fill #0

## 2019-08-13 ENCOUNTER — Other Ambulatory Visit: Payer: Self-pay | Admitting: Family Medicine

## 2019-08-13 ENCOUNTER — Other Ambulatory Visit: Payer: Self-pay | Admitting: Medical

## 2019-08-13 MED FILL — ESCITALOPRAM 10 MG TABLET: 10 | 30 days supply | Qty: 30 | Fill #1

## 2019-08-13 MED FILL — METOPROLOL TARTRATE 50 MG T: 50 | 30 days supply | Qty: 60 | Fill #0

## 2019-08-13 MED FILL — METHOCARBAMOL 500 MG TABS: 500 | 10 days supply | Qty: 30 | Fill #1

## 2019-08-13 NOTE — Telephone Encounter (Signed)
[facility] is requesting to fill pt topamax please advise [facility]

## 2019-08-14 MED FILL — TOPIRAMATE 50 MG TABLET: 50 | 30 days supply | Qty: 60 | Fill #0

## 2019-08-19 MED FILL — OXYCODONE-ACETAMINOPHEN 5-3: 5-325 | 7 days supply | Qty: 30 | Fill #0

## 2019-08-19 MED FILL — CYCLOBENZAPRINE 5 MG TABLET: 5 | 20 days supply | Qty: 60 | Fill #0

## 2019-08-20 DIAGNOSIS — Z800 Family history of malignant neoplasm of digestive organs: Secondary | ICD-10-CM | POA: Diagnosis not present

## 2019-08-20 DIAGNOSIS — K610 Anal abscess: Secondary | ICD-10-CM | POA: Diagnosis not present

## 2019-08-20 DIAGNOSIS — K5903 Drug induced constipation: Secondary | ICD-10-CM | POA: Diagnosis not present

## 2019-08-20 DIAGNOSIS — Z1211 Encounter for screening for malignant neoplasm of colon: Secondary | ICD-10-CM | POA: Diagnosis not present

## 2019-08-20 MED FILL — SUPREP BOWEL PREP KIT: 17.5-3.13-1 | 1 days supply | Qty: 354 | Fill #0

## 2019-08-22 DIAGNOSIS — L814 Other melanin hyperpigmentation: Secondary | ICD-10-CM | POA: Diagnosis not present

## 2019-08-22 DIAGNOSIS — D225 Melanocytic nevi of trunk: Secondary | ICD-10-CM | POA: Diagnosis not present

## 2019-08-22 DIAGNOSIS — L718 Other rosacea: Secondary | ICD-10-CM | POA: Diagnosis not present

## 2019-08-22 DIAGNOSIS — D1801 Hemangioma of skin and subcutaneous tissue: Secondary | ICD-10-CM | POA: Diagnosis not present

## 2019-08-22 DIAGNOSIS — L821 Other seborrheic keratosis: Secondary | ICD-10-CM | POA: Diagnosis not present

## 2019-08-22 DIAGNOSIS — L570 Actinic keratosis: Secondary | ICD-10-CM | POA: Diagnosis not present

## 2019-08-22 DIAGNOSIS — D235 Other benign neoplasm of skin of trunk: Secondary | ICD-10-CM | POA: Diagnosis not present

## 2019-08-22 DIAGNOSIS — D485 Neoplasm of uncertain behavior of skin: Secondary | ICD-10-CM | POA: Diagnosis not present

## 2019-08-22 DIAGNOSIS — Z872 Personal history of diseases of the skin and subcutaneous tissue: Secondary | ICD-10-CM | POA: Diagnosis not present

## 2019-08-22 DIAGNOSIS — L905 Scar conditions and fibrosis of skin: Secondary | ICD-10-CM | POA: Diagnosis not present

## 2019-08-22 MED FILL — ACYCLOVIR 5% OINTMENT: 5 | 30 days supply | Qty: 30 | Fill #0

## 2019-08-22 MED FILL — LIDOCAINE 5 % CRM: 5 | 15 days supply | Qty: 30 | Fill #1

## 2019-08-22 MED FILL — metroNIDAZOLE 0.75 % CREA: 0.75 | 30 days supply | Qty: 45 | Fill #0

## 2019-09-03 DIAGNOSIS — Z20822 Contact with and (suspected) exposure to covid-19: Secondary | ICD-10-CM | POA: Diagnosis not present

## 2019-09-03 DIAGNOSIS — I10 Essential (primary) hypertension: Secondary | ICD-10-CM | POA: Diagnosis not present

## 2019-09-03 DIAGNOSIS — R200 Anesthesia of skin: Secondary | ICD-10-CM | POA: Diagnosis not present

## 2019-09-03 DIAGNOSIS — Z6829 Body mass index (BMI) 29.0-29.9, adult: Secondary | ICD-10-CM | POA: Diagnosis not present

## 2019-09-03 DIAGNOSIS — G959 Disease of spinal cord, unspecified: Secondary | ICD-10-CM | POA: Diagnosis not present

## 2019-09-03 MED FILL — LIDOCAINE 5 % CRM: 5 | 15 days supply | Qty: 30 | Fill #1

## 2019-09-03 MED FILL — OXYCODONE-ACETAMINOPHEN 5-3: 5-325 | 7 days supply | Qty: 30 | Fill #0

## 2019-09-12 DIAGNOSIS — R200 Anesthesia of skin: Secondary | ICD-10-CM | POA: Diagnosis not present

## 2019-09-17 MED FILL — HYDROCODON-APAP 5-325: 5-325 | 8 days supply | Qty: 30 | Fill #0

## 2019-09-20 MED FILL — OXYCODONE-ACETAMINOPHEN 5-3: 5-325 | 7 days supply | Qty: 20 | Fill #0

## 2019-09-24 ENCOUNTER — Other Ambulatory Visit: Payer: Self-pay | Admitting: Neurological Surgery

## 2019-09-24 DIAGNOSIS — G959 Disease of spinal cord, unspecified: Secondary | ICD-10-CM

## 2019-09-27 ENCOUNTER — Other Ambulatory Visit: Payer: Self-pay | Admitting: Medical

## 2019-09-27 ENCOUNTER — Other Ambulatory Visit: Payer: Self-pay

## 2019-09-27 ENCOUNTER — Encounter: Payer: Self-pay | Admitting: Physical Therapy

## 2019-09-27 ENCOUNTER — Ambulatory Visit: Payer: 59 | Attending: Neurological Surgery | Admitting: Physical Therapy

## 2019-09-27 DIAGNOSIS — M62838 Other muscle spasm: Secondary | ICD-10-CM | POA: Insufficient documentation

## 2019-09-27 DIAGNOSIS — M542 Cervicalgia: Secondary | ICD-10-CM | POA: Diagnosis not present

## 2019-09-27 MED FILL — ESCITALOPRAM 10 MG TABLET: 10 | 30 days supply | Qty: 30 | Fill #0

## 2019-09-30 ENCOUNTER — Encounter: Payer: Self-pay | Admitting: Physical Therapy

## 2019-09-30 NOTE — Addendum Note (Signed)
Addended by: Carney Living on: 09/30/2019 01:16 PM   Modules accepted: Orders

## 2019-09-30 NOTE — Therapy (Signed)
[place], [place], [address] Phone: [phone]   Fax:  [phone]  Physical Therapy Evaluation  Patient Details  Name: Kelly [name] MRN: [id number] Date of Birth: [birth year] Referring Provider (PT): Dr Sherley Bounds    Encounter Date: 09/27/2019  PT End of Session - 09/30/19 1246    Visit Number  1    Number of Visits  12    Date for PT Re-Evaluation  11/11/19    Authorization Type  [facility] [employer]    PT Start Time  [phone]   patient 5 min late   PT Stop Time  0930    PT Time Calculation (min)  40 min    Activity Tolerance  Patient tolerated treatment well    Behavior During Therapy  [facility] for tasks assessed/performed       Past Medical History:  Diagnosis Date  . Anticardiolipin antibody positive   . HSV-2 (herpes simplex virus 2) infection   . Hyperlipidemia   . Hypertension    diet controlled  . Kidney stone   . Migraine   . Renal calculus, right   . Sleep apnea   . TMJ (dislocation of temporomandibular joint)     Past Surgical History:  Procedure Laterality Date  . CESAREAN SECTION     X3   LAST ONE 11-30-2004  . CYSTOSCOPY W/ RETROGRADES Right 10/29/2012   Procedure: CYSTOSCOPY WITH RETROGRADE PYELOGRAM and stent placement;  Surgeon: Reece Packer, MD;  Location: [facility];  Service: Urology;  Laterality: Right;  rt stent placement , rt retrograde and cysto   . CYSTOSCOPY W/ URETERAL STENT PLACEMENT Right 11/23/2012   Procedure: CYSTOSCOPY WITH STENT REPLACEMENT;  Surgeon: Alexis Frock, MD;  Location: [facility];  Service: Urology;  Laterality: Right;  . CYSTOSCOPY/RETROGRADE/URETEROSCOPY/STONE EXTRACTION WITH BASKET Right 11/23/2012   Procedure: CYSTOSCOPY/RETROGRADE/URETEROSCOPY/STONE EXTRACTION WITH BASKET;  Surgeon: Alexis Frock, MD;  Location: [facility];  Service: Urology;  Laterality: Right;    There were no vitals filed for this  visit.   Subjective Assessment - 09/30/19 1244    Subjective  Patient had a cervical fudsion on December 2nd. Since that point she has had increased pain when she has to abductio her arms. She has also had numbness in each hand and has had a cold sensation in each hand. She has an MRI 10/07/2019. She is having difficulty sitting on the computer.    Pertinent History  TMJ, migranes,    Limitations  Sitting    How long can you stand comfortably?  standing is OK    How long can you walk comfortably?  no real diffulty    Diagnostic tests  X-rays showed good fusion    Patient Stated Goals  Less pain/ To be able to go back to work    Currently in Pain?  Yes    Pain Score  7     Pain Location  Neck    Pain Orientation  Right;Left    Pain Descriptors / Indicators  Aching    Pain Type  Surgical pain    Pain Radiating Towards  pain radiates into shoulder blades    Pain Onset  More than a month ago    Pain Frequency  Constant    Aggravating Factors   work    Pain Relieving Factors  rest    Effect of Pain on Daily Activities  unable to work  [facility] PT Assessment - 09/30/19 0001      Assessment   Medical Diagnosis  Neck Pain     Referring Provider (PT)  Dr Sherley Bounds     Onset Date/Surgical Date  07/24/19    Hand Dominance  Right    Next MD Visit  MRI 10/07/2019    Prior Therapy  Had therapy prioer to her surgery       Precautions   Precautions  Cervical    Precaution Comments  no lifting over 8lbs       Restrictions   Weight Bearing Restrictions  No      Balance Screen   Has the patient fallen in the past 6 months  No    Has the patient had a decrease in activity level because of a fear of falling?   No    Is the patient reluctant to leave their home because of a fear of falling?   No      Home Social worker  Private residence    Living Arrangements  Children    Available Help at Discharge  Available PRN/intermittently    Type of [place] to enter    Entrance Stairs-Number of Steps  4      Prior Function   Level of Independence  Independent    Vocation  Full time employment    Vocation Requirements  works for [employer], Holds her phone on the left side       Cognition   Overall Cognitive Status  Within Functional Limits for tasks assessed    Attention  Focused    Focused Attention  Appears intact    Memory  Appears intact    Awareness  Appears intact    Problem Solving  Appears intact      Observation/Other Assessments   Focus on Therapeutic Outcomes (FOTO)   52% limitation       Sensation   Light Touch  Appears Intact    Additional Comments  parathesias into bilateral pinkies several times a day.       Coordination   Gross Motor Movements are Fluid and Coordinated  Yes    Fine Motor Movements are Fluid and Coordinated  Yes      AROM   Overall AROM Comments  bilateral adduction with elbows bent 3/5;     Cervical Flexion  30    Cervical Extension  15    Cervical - Right Rotation  30    Cervical - Left Rotation  50      Strength   Right Shoulder Flexion  3+/5    Right Shoulder Internal Rotation  3+/5    Right Shoulder External Rotation  3+/5    Left Shoulder Flexion  3+/5    Left Shoulder Internal Rotation  3+/5    Left Shoulder External Rotation  3+/5      Palpation   Palpation comment  significant spasming of the upper traps                 Objective measurements completed on examination: See above findings.      [facility] Adult PT Treatment/Exercise - 09/30/19 0001      Neck Exercises: Standing   Other Standing Exercises  scap retraction       Manual Therapy   Manual Therapy  Passive ROM    Soft tissue mobilization  trigger point release to cervical parapinals, upper trap ,  IASTYM to upper trap and shoulder.     Manual Traction  gentle cervical traction; sub-occipital release              PT Education - 09/30/19 1246    Education Details   reviewed HEP and symptom mangement    Person(s) Educated  Patient    Methods  Explanation;Demonstration;Verbal cues;Tactile cues    Comprehension  Verbalized understanding;Verbal cues required;Tactile cues required;Need further instruction;Returned demonstration       PT Short Term Goals - 09/27/19 1448      PT SHORT TERM GOAL #1   Title  Patient will be indepdent with relaxation and breathing techniques to reduce actue inflamtion.    Time  3    Period  Weeks    Status  New    Target Date  11/08/19      PT SHORT TERM GOAL #2   Title  Patient will report no radiating pain down both of her amrs    Baseline  intermitent depending on activity    Time  3    Period  Weeks    Status  New    Target Date  10/21/19      PT SHORT TERM GOAL #3   Title  Patient will increase gross bilateral UE strength to 4/5    Time  3    Period  Weeks    Status  New    Target Date  10/21/19        PT Long Term Goals - 09/27/19 1451      PT LONG TERM GOAL #1   Title  Patient will demonstrate 60 degrees of passive rotation ROM bilateral without pain    Baseline  60 bilateral    Time  6    Period  Weeks    Status  New    Target Date  12/25/20      PT LONG TERM GOAL #2   Title  Patient will sit at her desk for 4 hours without pain    Time  6    Period  Weeks    Status  New    Target Date  11/08/19      PT LONG TERM GOAL #3   Title  Patient will reach overhead to acabinet without pain    Time  6    Period  Weeks    Status  New    Target Date  11/11/19               Patient will benefit from skilled therapeutic intervention in order to improve the following deficits and impairments:  Pain, Increased muscle spasms, Decreased activity tolerance, Decreased strength, Decreased range of motion, Decreased endurance, Increased fascial restricitons  Visit Diagnosis: Cervicalgia  Other muscle spasm     Problem List Patient Active Problem List   Diagnosis Date Noted  . Stressful  life event affecting family 07/12/2019  . Caregiver burden 07/12/2019  . Tobacco abuse counseling 07/12/2019  . Protrusion of cervical intervertebral disc 04/10/2019  . Spinal stenosis of cervical region 04/10/2019  . Overactive bladder 11/19/2018  . Photosensitization due to sun 11/02/2018  . Generalized pain 11/02/2018  . High risk medication use 11/02/2018  . Foot pain, bilateral 05/10/2018  . Heel pain, bilateral 05/10/2018  . Plantar fasciitis 05/10/2018  . Left knee pain 05/10/2018  . Patellofemoral pain syndrome of both knees 05/10/2018  . Generalized abdominal pain 04/17/2018  . Flank pain 04/17/2018  . Acute back pain 04/17/2018  .  History of renal stone 04/17/2018  . Urinary frequency 04/17/2018  . Laceration of left hand 01/31/2018  . Decreased range of motion of finger of left hand 01/31/2018  . Localized swelling on left hand 01/31/2018  . Chronic migraine without aura without status migrainosus, not intractable 11/01/2016  . History of migraine 06/10/2016  . Diarrhea 05/20/2016  . Pain in the chest 05/11/2016  . Dizziness and giddiness 05/11/2016  . History of hypertension 05/11/2016  . Hemorrhoidal skin tag 04/07/2015  . IUD (intrauterine device) in place 05/03/2013  . Allergic rhinitis 01/11/2013  . Current smoker 01/11/2013  . Weight gain 06/26/2012  . Cervical stenosis (uterine cervix) 04/25/2012  . Anticardiolipin antibody positive   . Hyperlipidemia   . Hypertension   . History of trichomoniasis 01/18/2011    Class: History of  . THYROID NODULE, RIGHT 02/05/2008  . GERD 02/05/2008  . NECK PAIN 02/05/2008    Carney Living PT DPT  09/30/2019, 1:12 PM  [facility] [address] [place], [place], [address] Phone: [phone]   Fax:  [phone]  Name: Kelly [name] MRN: [id number] Date of Birth: [birth year]

## 2019-10-01 ENCOUNTER — Ambulatory Visit: Payer: 59 | Admitting: Physical Therapy

## 2019-10-01 ENCOUNTER — Other Ambulatory Visit: Payer: Self-pay

## 2019-10-01 DIAGNOSIS — M62838 Other muscle spasm: Secondary | ICD-10-CM | POA: Diagnosis not present

## 2019-10-01 DIAGNOSIS — M542 Cervicalgia: Secondary | ICD-10-CM

## 2019-10-01 MED FILL — OXYCODONE-ACETAMINOPHEN 5-3: 5-325 | 7 days supply | Qty: 20 | Fill #0

## 2019-10-02 NOTE — Therapy (Signed)
[place], [place], [address] Phone: [phone]   Fax:  [phone]  Physical Therapy Treatment  Patient Details  Name: Kelly [name] MRN: [id number] Date of Birth: [birth year] Referring Provider (PT): Dr Sherley Bounds    Encounter Date: 10/01/2019  PT End of Session - 10/02/19 1722    Visit Number  2    Number of Visits  12    Date for PT Re-Evaluation  11/11/19    Authorization Type  [facility]    PT Start Time  1500    PT Stop Time  1540    PT Time Calculation (min)  40 min    Activity Tolerance  Patient tolerated treatment well    Behavior During Therapy  [facility] for tasks assessed/performed       Past Medical History:  Diagnosis Date  . Anticardiolipin antibody positive   . HSV-2 (herpes simplex virus 2) infection   . Hyperlipidemia   . Hypertension    diet controlled  . Kidney stone   . Migraine   . Renal calculus, right   . Sleep apnea   . TMJ (dislocation of temporomandibular joint)     Past Surgical History:  Procedure Laterality Date  . CESAREAN SECTION     X3   LAST ONE 11-30-2004  . CYSTOSCOPY W/ RETROGRADES Right 10/29/2012   Procedure: CYSTOSCOPY WITH RETROGRADE PYELOGRAM and stent placement;  Surgeon: Reece Packer, MD;  Location: [facility];  Service: Urology;  Laterality: Right;  rt stent placement , rt retrograde and cysto   . CYSTOSCOPY W/ URETERAL STENT PLACEMENT Right 11/23/2012   Procedure: CYSTOSCOPY WITH STENT REPLACEMENT;  Surgeon: Alexis Frock, MD;  Location: [facility];  Service: Urology;  Laterality: Right;  . CYSTOSCOPY/RETROGRADE/URETEROSCOPY/STONE EXTRACTION WITH BASKET Right 11/23/2012   Procedure: CYSTOSCOPY/RETROGRADE/URETEROSCOPY/STONE EXTRACTION WITH BASKET;  Surgeon: Alexis Frock, MD;  Location: [facility];  Service: Urology;  Laterality: Right;    There were no vitals filed for this visit.  Subjective Assessment  - 10/02/19 1622    Subjective  Patient reports she was sore for a day after the last treatment. she had burning down into her shoulder blades    Pertinent History  TMJ, migranes,    Limitations  Sitting    How long can you stand comfortably?  standing is OK    How long can you walk comfortably?  no real diffulty    Diagnostic tests  X-rays showed good fusion    Patient Stated Goals  Less pain/ To be able to go back to work    Currently in Pain?  Yes    Pain Score  7     Pain Location  Neck    Pain Orientation  Right;Left    Pain Descriptors / Indicators  Aching    Pain Radiating Towards  bilateral shoulder blades    Pain Onset  More than a month ago    Pain Frequency  Constant    Aggravating Factors   work    Pain Relieving Factors  rest    Effect of Pain on Daily Activities  unable to work    Multiple Pain Sites  No                       [facility] Adult PT Treatment/Exercise - 10/02/19 0001      Neck Exercises: Seated   Other Seated Exercise  pulleys 2 min flexion i  pain free ranges     Other Seated Exercise  seatedscap retraction x20       Electrical Stimulation   Electrical Stimulation Location  cervical spine     Electrical Stimulation Action  IFC   IFC   Electrical Stimulation Parameters  to tolerance     Electrical Stimulation Goals  Pain      Manual Therapy   Manual Therapy  Passive ROM    Soft tissue mobilization  trigger point release to cervical parapinals, upper trap , IASTYM to upper trap and shoulder.     Manual Traction  sub-occipital release              PT Education - 10/02/19 1722    Education Details  relaxation techniques    Person(s) Educated  Patient    Methods  Explanation;Demonstration;Tactile cues;Verbal cues    Comprehension  Verbalized understanding;Returned demonstration;Verbal cues required;Tactile cues required       PT Short Term Goals - 09/27/19 1448      PT SHORT TERM GOAL #1   Title  Patient will be indepdent with  relaxation and breathing techniques to reduce actue inflamtion.    Time  3    Period  Weeks    Status  New    Target Date  11/08/19      PT SHORT TERM GOAL #2   Title  Patient will report no radiating pain down both of her amrs    Baseline  intermitent depending on activity    Time  3    Period  Weeks    Status  New    Target Date  10/21/19      PT SHORT TERM GOAL #3   Title  Patient will increase gross bilateral UE strength to 4/5    Time  3    Period  Weeks    Status  New    Target Date  10/21/19        PT Long Term Goals - 09/27/19 1451      PT LONG TERM GOAL #1   Title  Patient will demonstrate 60 degrees of passive rotation ROM bilateral without pain    Baseline  60 bilateral    Time  6    Period  Weeks    Status  New    Target Date  12/25/20      PT LONG TERM GOAL #2   Title  Patient will sit at her desk for 4 hours without pain    Time  6    Period  Weeks    Status  New    Target Date  11/08/19      PT LONG TERM GOAL #3   Title  Patient will reach overhead to acabinet without pain    Time  6    Period  Weeks    Status  New    Target Date  11/11/19            Plan - 10/02/19 1723    Clinical Impression Statement  Patientreported improved pain with treatment but she has significant spasming in her neck and upper traps. She likely needs light sttrengthening for posture and UE but we will wait until she has had her MRI and has talked to the MD.    Personal Factors and Comorbidities  Comorbidity 1;Comorbidity 2;Profession    Comorbidities  history of cervical spine pain, TMJ    Examination-Activity Limitations  Lift;Reach Overhead;Locomotion Level    Examination-Participation Restrictions  [employer]  Stability/Clinical Decision Making  Evolving/Moderate complexity    Clinical Decision Making  Moderate    Rehab Potential  Good    PT Frequency  2x / week    PT Duration  6 weeks    PT Treatment/Interventions  ADLs/Self Care Home  Management;Cryotherapy;Electrical Stimulation;Iontophoresis 4mg /ml Dexamethasone;Moist Heat;Ultrasound;Traction;DME Instruction;Gait training;Stair training;Functional mobility training;Therapeutic activities;Therapeutic exercise;Neuromuscular re-education;Patient/family education;Manual techniques;Passive range of motion;Dry needling;Taping    PT Next Visit Plan  begin manual therapy on upper traps and cervical muscles.    PT Home Exercise Plan  scap retraction    Consulted and Agree with Plan of Care  Patient       Patient will benefit from skilled therapeutic intervention in order to improve the following deficits and impairments:  Pain, Increased muscle spasms, Decreased activity tolerance, Decreased strength, Decreased range of motion, Decreased endurance, Increased fascial restricitons  Visit Diagnosis: Cervicalgia  Other muscle spasm     Problem List Patient Active Problem List   Diagnosis Date Noted  . Stressful life event affecting family 07/12/2019  . Caregiver burden 07/12/2019  . Tobacco abuse counseling 07/12/2019  . Protrusion of cervical intervertebral disc 04/10/2019  . Spinal stenosis of cervical region 04/10/2019  . Overactive bladder 11/19/2018  . Photosensitization due to sun 11/02/2018  . Generalized pain 11/02/2018  . High risk medication use 11/02/2018  . Foot pain, bilateral 05/10/2018  . Heel pain, bilateral 05/10/2018  . Plantar fasciitis 05/10/2018  . Left knee pain 05/10/2018  . Patellofemoral pain syndrome of both knees 05/10/2018  . Generalized abdominal pain 04/17/2018  . Flank pain 04/17/2018  . Acute back pain 04/17/2018  . History of renal stone 04/17/2018  . Urinary frequency 04/17/2018  . Laceration of left hand 01/31/2018  . Decreased range of motion of finger of left hand 01/31/2018  . Localized swelling on left hand 01/31/2018  . Chronic migraine without aura without status migrainosus, not intractable 11/01/2016  . History of migraine  06/10/2016  . Diarrhea 05/20/2016  . Pain in the chest 05/11/2016  . Dizziness and giddiness 05/11/2016  . History of hypertension 05/11/2016  . Hemorrhoidal skin tag 04/07/2015  . IUD (intrauterine device) in place 05/03/2013  . Allergic rhinitis 01/11/2013  . Current smoker 01/11/2013  . Weight gain 06/26/2012  . Cervical stenosis (uterine cervix) 04/25/2012  . Anticardiolipin antibody positive   . Hyperlipidemia   . Hypertension   . History of trichomoniasis 01/18/2011    Class: History of  . THYROID NODULE, RIGHT 02/05/2008  . GERD 02/05/2008  . NECK PAIN 02/05/2008    Carney Living PT DPT  10/02/2019, 5:27 PM  [facility] [address] [place], [place], [address] Phone: [phone]   Fax:  [phone]  Name: Kelly [name] MRN: [id number] Date of Birth: 07/09/1966

## 2019-10-03 ENCOUNTER — Encounter: Payer: Self-pay | Admitting: Physical Therapy

## 2019-10-03 ENCOUNTER — Other Ambulatory Visit: Payer: Self-pay

## 2019-10-03 ENCOUNTER — Ambulatory Visit: Payer: 59 | Admitting: Physical Therapy

## 2019-10-03 DIAGNOSIS — M542 Cervicalgia: Secondary | ICD-10-CM | POA: Diagnosis not present

## 2019-10-03 DIAGNOSIS — M62838 Other muscle spasm: Secondary | ICD-10-CM

## 2019-10-04 NOTE — Therapy (Signed)
[place], [place], [address] Phone: [phone]   Fax:  [phone]  Physical Therapy Treatment  Patient Details  Name: Kelly [name] MRN: [id number] Date of Birth: [birth year] Referring Provider (PT): Dr Sherley Bounds    Encounter Date: 10/03/2019  PT End of Session - 10/04/19 1209    Visit Number  3    Number of Visits  12    Date for PT Re-Evaluation  11/11/19    Authorization Type  [facility] [employer]    PT Start Time  1503    PT Stop Time  1600    PT Time Calculation (min)  57 min    Activity Tolerance  Patient tolerated treatment well    Behavior During Therapy  [facility] for tasks assessed/performed       Past Medical History:  Diagnosis Date  . Anticardiolipin antibody positive   . HSV-2 (herpes simplex virus 2) infection   . Hyperlipidemia   . Hypertension    diet controlled  . Kidney stone   . Migraine   . Renal calculus, right   . Sleep apnea   . TMJ (dislocation of temporomandibular joint)     Past Surgical History:  Procedure Laterality Date  . CESAREAN SECTION     X3   LAST ONE 11-30-2004  . CYSTOSCOPY W/ RETROGRADES Right 10/29/2012   Procedure: CYSTOSCOPY WITH RETROGRADE PYELOGRAM and stent placement;  Surgeon: Reece Packer, MD;  Location: [facility];  Service: Urology;  Laterality: Right;  rt stent placement , rt retrograde and cysto   . CYSTOSCOPY W/ URETERAL STENT PLACEMENT Right 11/23/2012   Procedure: CYSTOSCOPY WITH STENT REPLACEMENT;  Surgeon: Alexis Frock, MD;  Location: [facility];  Service: Urology;  Laterality: Right;  . CYSTOSCOPY/RETROGRADE/URETEROSCOPY/STONE EXTRACTION WITH BASKET Right 11/23/2012   Procedure: CYSTOSCOPY/RETROGRADE/URETEROSCOPY/STONE EXTRACTION WITH BASKET;  Surgeon: Alexis Frock, MD;  Location: [facility];  Service: Urology;  Laterality: Right;    There were no vitals filed for this visit.  Subjective  Assessment - 10/03/19 1611    Subjective  Less soreness noted after the last vist. Overal it feels about the same. She tried a little work on the computer    Pertinent History  TMJ, migranes,    Limitations  Sitting    How long can you stand comfortably?  standing is OK    How long can you walk comfortably?  no real diffulty    Diagnostic tests  X-rays showed good fusion    Currently in Pain?  Yes    Pain Score  5     Pain Location  Neck    Pain Orientation  Right    Pain Descriptors / Indicators  Aching    Pain Type  Chronic pain    Pain Onset  More than a month ago    Pain Frequency  Constant    Aggravating Factors   work    Pain Relieving Factors  rest    Effect of Pain on Daily Activities  unable to work                       [facility] Adult PT Treatment/Exercise - 10/04/19 0001      Neck Exercises: Standing   Other Standing Exercises  scap retraction x20 yellow; shoulder extension x20       Neck Exercises: Seated   Other Seated Exercise  pulleys 2 min flexion i pain free ranges  Programme researcher, broadcasting/film/video  IFC    Electrical Stimulation Parameters  to tolerance     Electrical Stimulation Goals  Pain      Manual Therapy   Manual Therapy  Passive ROM    Soft tissue mobilization  trigger point release to cervical parapinals, upper trap , IASTYM to upper trap and shoulder.     Manual Traction  sub-occipital release              PT Education - 10/03/19 1614    Education Details  ture with ther-ex    Person(s) Educated  Patient    Methods  Explanation;Demonstration;Tactile cues;Verbal cues    Comprehension  Verbalized understanding;Returned demonstration;Verbal cues required;Tactile cues required       PT Short Term Goals - 09/27/19 1448      PT SHORT TERM GOAL #1   Title  Patient will be indepdent with relaxation and breathing techniques to reduce actue inflamtion.     Time  3    Period  Weeks    Status  New    Target Date  11/08/19      PT SHORT TERM GOAL #2   Title  Patient will report no radiating pain down both of her amrs    Baseline  intermitent depending on activity    Time  3    Period  Weeks    Status  New    Target Date  10/21/19      PT SHORT TERM GOAL #3   Title  Patient will increase gross bilateral UE strength to 4/5    Time  3    Period  Weeks    Status  New    Target Date  10/21/19        PT Long Term Goals - 09/27/19 1451      PT LONG TERM GOAL #1   Title  Patient will demonstrate 60 degrees of passive rotation ROM bilateral without pain    Baseline  60 bilateral    Time  6    Period  Weeks    Status  New    Target Date  12/25/20      PT LONG TERM GOAL #2   Title  Patient will sit at her desk for 4 hours without pain    Time  6    Period  Weeks    Status  New    Target Date  11/08/19      PT LONG TERM GOAL #3   Title  Patient will reach overhead to acabinet without pain    Time  6    Period  Weeks    Status  New    Target Date  11/11/19            Plan - 10/04/19 1210    Clinical Impression Statement  Patient continues to have signiifcant spasming in her upper traps and peri-scpaular area. She was given light postural exercises today. Therapy will continue to advance as tolerated.    Personal Factors and Comorbidities  Comorbidity 1;Comorbidity 2;Profession    Comorbidities  history of cervical spine pain, TMJ    Examination-Activity Limitations  Lift;Reach Overhead;Locomotion Level    Stability/Clinical Decision Making  Evolving/Moderate complexity    Clinical Decision Making  Moderate    Rehab Potential  Good    PT Frequency  2x / week    PT Treatment/Interventions  ADLs/Self Care Home  Management;Cryotherapy;Electrical Stimulation;Iontophoresis 4mg /ml Dexamethasone;Moist Heat;Ultrasound;Traction;DME Instruction;Gait training;Stair training;Functional mobility training;Therapeutic  activities;Therapeutic exercise;Neuromuscular re-education;Patient/family education;Manual techniques;Passive range of motion;Dry needling;Taping    PT Next Visit Plan  begin manual therapy on upper traps and cervical muscles.    PT Home Exercise Plan  scap retraction    Consulted and Agree with Plan of Care  Patient       Patient will benefit from skilled therapeutic intervention in order to improve the following deficits and impairments:  Pain, Increased muscle spasms, Decreased activity tolerance, Decreased strength, Decreased range of motion, Decreased endurance, Increased fascial restricitons  Visit Diagnosis: Cervicalgia  Other muscle spasm     Problem List Patient Active Problem List   Diagnosis Date Noted  . Stressful life event affecting family 07/12/2019  . Caregiver burden 07/12/2019  . Tobacco abuse counseling 07/12/2019  . Protrusion of cervical intervertebral disc 04/10/2019  . Spinal stenosis of cervical region 04/10/2019  . Overactive bladder 11/19/2018  . Photosensitization due to sun 11/02/2018  . Generalized pain 11/02/2018  . High risk medication use 11/02/2018  . Foot pain, bilateral 05/10/2018  . Heel pain, bilateral 05/10/2018  . Plantar fasciitis 05/10/2018  . Left knee pain 05/10/2018  . Patellofemoral pain syndrome of both knees 05/10/2018  . Generalized abdominal pain 04/17/2018  . Flank pain 04/17/2018  . Acute back pain 04/17/2018  . History of renal stone 04/17/2018  . Urinary frequency 04/17/2018  . Laceration of left hand 01/31/2018  . Decreased range of motion of finger of left hand 01/31/2018  . Localized swelling on left hand 01/31/2018  . Chronic migraine without aura without status migrainosus, not intractable 11/01/2016  . History of migraine 06/10/2016  . Diarrhea 05/20/2016  . Pain in the chest 05/11/2016  . Dizziness and giddiness 05/11/2016  . History of hypertension 05/11/2016  . Hemorrhoidal skin tag 04/07/2015  . IUD  (intrauterine device) in place 05/03/2013  . Allergic rhinitis 01/11/2013  . Current smoker 01/11/2013  . Weight gain 06/26/2012  . Cervical stenosis (uterine cervix) 04/25/2012  . Anticardiolipin antibody positive   . Hyperlipidemia   . Hypertension   . History of trichomoniasis 01/18/2011    Class: History of  . THYROID NODULE, RIGHT 02/05/2008  . GERD 02/05/2008  . NECK PAIN 02/05/2008    Carney Living PT DPT  10/04/2019, 12:12 PM  [facility] [address] [place], [place], [address] Phone: [phone]   Fax:  [phone]  Name: Kelly [name] MRN: [id number] Date of Birth: July 11, 1966

## 2019-10-07 ENCOUNTER — Ambulatory Visit
Admission: RE | Admit: 2019-10-07 | Discharge: 2019-10-07 | Disposition: A | Discharge status: Home / Self Care | Payer: 59 | Source: Ambulatory Visit | Attending: Neurological Surgery | Admitting: Neurological Surgery

## 2019-10-07 ENCOUNTER — Other Ambulatory Visit: Payer: Self-pay

## 2019-10-07 DIAGNOSIS — G959 Disease of spinal cord, unspecified: Secondary | ICD-10-CM

## 2019-10-07 DIAGNOSIS — M50223 Other cervical disc displacement at C6-C7 level: Secondary | ICD-10-CM | POA: Diagnosis not present

## 2019-10-07 DIAGNOSIS — M47812 Spondylosis without myelopathy or radiculopathy, cervical region: Secondary | ICD-10-CM | POA: Diagnosis not present

## 2019-10-07 DIAGNOSIS — M4802 Spinal stenosis, cervical region: Secondary | ICD-10-CM | POA: Diagnosis not present

## 2019-10-08 ENCOUNTER — Encounter: Payer: Self-pay | Admitting: Physical Therapy

## 2019-10-08 ENCOUNTER — Telehealth: Payer: Self-pay

## 2019-10-08 ENCOUNTER — Ambulatory Visit: Payer: 59 | Admitting: Physical Therapy

## 2019-10-08 DIAGNOSIS — M542 Cervicalgia: Secondary | ICD-10-CM

## 2019-10-08 DIAGNOSIS — M62838 Other muscle spasm: Secondary | ICD-10-CM

## 2019-10-08 NOTE — Telephone Encounter (Signed)
Tried calling patient. No answer. Unable to [facility] due to mailbox being full. Was going to let her know her requested statement can be picked up at front desk.

## 2019-10-09 ENCOUNTER — Encounter: Payer: Self-pay | Admitting: Physical Therapy

## 2019-10-09 NOTE — Therapy (Signed)
[place], [place], [address] Phone: [phone]   Fax:  [phone]  Physical Therapy Treatment  Patient Details  Name: Kelly [name] MRN: [id number] Date of Birth: 29-Sep-1965 Referring Provider (PT): Dr Sherley Bounds    Encounter Date: 10/08/2019  PT End of Session - 10/09/19 0930    Visit Number  3    Number of Visits  12    Date for PT Re-Evaluation  11/11/19    Authorization Type  [facility]    PT Start Time  1500    PT Stop Time  1556    PT Time Calculation (min)  56 min    Activity Tolerance  Patient tolerated treatment well    Behavior During Therapy  [facility] for tasks assessed/performed       Past Medical History:  Diagnosis Date  . Anticardiolipin antibody positive   . HSV-2 (herpes simplex virus 2) infection   . Hyperlipidemia   . Hypertension    diet controlled  . Kidney stone   . Migraine   . Renal calculus, right   . Sleep apnea   . TMJ (dislocation of temporomandibular joint)     Past Surgical History:  Procedure Laterality Date  . CESAREAN SECTION     X3   LAST ONE 11-30-2004  . CYSTOSCOPY W/ RETROGRADES Right 10/29/2012   Procedure: CYSTOSCOPY WITH RETROGRADE PYELOGRAM and stent placement;  Surgeon: Reece Packer, MD;  Location: [facility];  Service: Urology;  Laterality: Right;  rt stent placement , rt retrograde and cysto   . CYSTOSCOPY W/ URETERAL STENT PLACEMENT Right 11/23/2012   Procedure: CYSTOSCOPY WITH STENT REPLACEMENT;  Surgeon: Alexis Frock, MD;  Location: [facility];  Service: Urology;  Laterality: Right;  . CYSTOSCOPY/RETROGRADE/URETEROSCOPY/STONE EXTRACTION WITH BASKET Right 11/23/2012   Procedure: CYSTOSCOPY/RETROGRADE/URETEROSCOPY/STONE EXTRACTION WITH BASKET;  Surgeon: Alexis Frock, MD;  Location: [facility];  Service: Urology;  Laterality: Right;    There were no vitals filed for this visit.  Subjective  Assessment - 10/08/19 1532    Subjective  Patient reports her pain has been a little better. She has been to to her MRI. She has not heard from her MD yet.    How long can you stand comfortably?  standing is OK    How long can you walk comfortably?  no real diffulty    Diagnostic tests  X-rays showed good fusion    Currently in Pain?  Yes    Pain Score  3     Pain Location  Neck    Pain Orientation  Left;Right    Pain Descriptors / Indicators  Aching    Pain Type  Chronic pain    Pain Radiating Towards  into shoulder blades    Pain Onset  More than a month ago    Pain Frequency  Constant    Aggravating Factors   wo    Pain Relieving Factors  rest    Effect of Pain on Daily Activities  difficulty working                       [facility] Adult PT Treatment/Exercise - 10/09/19 0001      Neck Exercises: Standing   Other Standing Exercises  scap retraction x20 red; shoulder extension x20 red      Neck Exercises: Supine   Other Supine Exercise  bilateral ER and horizontal abduction yellow 2x10 each with min cuing.  Noincreased pain noted       Lumbar Exercises: Seated   Other Seated Lumbar Exercises  pulleys 2 min       Electrical Stimulation   Electrical Stimulation Location  cervical spine     Electrical Stimulation Action  IFC     Electrical Stimulation Parameters  to Technical sales engineer Goals  Pain      Manual Therapy   Manual Therapy  Passive ROM    Soft tissue mobilization  trigger point release to cervical parapinals, upper trap , IASTYM to upper trap and shoulder.     Manual Traction  sub-occipital release              PT Education - 10/09/19 0929    Education Details  reviewed ther-ex    Person(s) Educated  Patient    Methods  Explanation;Demonstration;Tactile cues;Verbal cues    Comprehension  Verbalized understanding;Returned demonstration;Verbal cues required;Tactile cues required       PT Short Term Goals - 09/27/19 1448      PT  SHORT TERM GOAL #1   Title  Patient will be indepdent with relaxation and breathing techniques to reduce actue inflamtion.    Time  3    Period  Weeks    Status  New    Target Date  11/08/19      PT SHORT TERM GOAL #2   Title  Patient will report no radiating pain down both of her amrs    Baseline  intermitent depending on activity    Time  3    Period  Weeks    Status  New    Target Date  10/21/19      PT SHORT TERM GOAL #3   Title  Patient will increase gross bilateral UE strength to 4/5    Time  3    Period  Weeks    Status  New    Target Date  10/21/19        PT Long Term Goals - 09/27/19 1451      PT LONG TERM GOAL #1   Title  Patient will demonstrate 60 degrees of passive rotation ROM bilateral without pain    Baseline  60 bilateral    Time  6    Period  Weeks    Status  New    Target Date  12/25/20      PT LONG TERM GOAL #2   Title  Patient will sit at her desk for 4 hours without pain    Time  6    Period  Weeks    Status  New    Target Date  11/08/19      PT LONG TERM GOAL #3   Title  Patient will reach overhead to acabinet without pain    Time  6    Period  Weeks    Status  New    Target Date  11/11/19            Plan - 10/09/19 0933    Clinical Impression Statement  Patient continues to have pain and significant spasming. She reports overall it is less painful then it was but still limiting. Sh ewas not sure if she should practice working on the computer. Therapy advised her at this time it was likely best to let her inflammation decrease and build her strength with low level exercises. She will concentrate on her light strengthening instead of trying to build up her time at the  computer. We will re-assess her computer tolerance in a few weeks to see if this strategy is successful. Therapy will continue to work on soft tissue mobilziation and light strengthneing for posture.    Personal Factors and Comorbidities  Comorbidity 1;Comorbidity  2;Profession    Comorbidities  history of cervical spine pain, TMJ    Examination-Activity Limitations  Lift;Reach Overhead;Locomotion Level    Stability/Clinical Decision Making  Evolving/Moderate complexity    Clinical Decision Making  Moderate    Rehab Potential  Good    PT Frequency  2x / week    PT Duration  6 weeks    PT Treatment/Interventions  ADLs/Self Care Home Management;Cryotherapy;Electrical Stimulation;Iontophoresis 4mg /ml Dexamethasone;Moist Heat;Ultrasound;Traction;DME Instruction;Gait training;Stair training;Functional mobility training;Therapeutic activities;Therapeutic exercise;Neuromuscular re-education;Patient/family education;Manual techniques;Passive range of motion;Dry needling;Taping    PT Next Visit Plan  begin manual therapy on upper traps and cervical muscles.    PT Home Exercise Plan  scap retraction    Consulted and Agree with Plan of Care  Patient       Patient will benefit from skilled therapeutic intervention in order to improve the following deficits and impairments:  Pain, Increased muscle spasms, Decreased activity tolerance, Decreased strength, Decreased range of motion, Decreased endurance, Increased fascial restricitons  Visit Diagnosis: Cervicalgia  Other muscle spasm     Problem List Patient Active Problem List   Diagnosis Date Noted  . Stressful life event affecting family 07/12/2019  . Caregiver burden 07/12/2019  . Tobacco abuse counseling 07/12/2019  . Protrusion of cervical intervertebral disc 04/10/2019  . Spinal stenosis of cervical region 04/10/2019  . Overactive bladder 11/19/2018  . Photosensitization due to sun 11/02/2018  . Generalized pain 11/02/2018  . High risk medication use 11/02/2018  . Foot pain, bilateral 05/10/2018  . Heel pain, bilateral 05/10/2018  . Plantar fasciitis 05/10/2018  . Left knee pain 05/10/2018  . Patellofemoral pain syndrome of both knees 05/10/2018  . Generalized abdominal pain 04/17/2018  .  Flank pain 04/17/2018  . Acute back pain 04/17/2018  . History of renal stone 04/17/2018  . Urinary frequency 04/17/2018  . Laceration of left hand 01/31/2018  . Decreased range of motion of finger of left hand 01/31/2018  . Localized swelling on left hand 01/31/2018  . Chronic migraine without aura without status migrainosus, not intractable 11/01/2016  . History of migraine 06/10/2016  . Diarrhea 05/20/2016  . Pain in the chest 05/11/2016  . Dizziness and giddiness 05/11/2016  . History of hypertension 05/11/2016  . Hemorrhoidal skin tag 04/07/2015  . IUD (intrauterine device) in place 05/03/2013  . Allergic rhinitis 01/11/2013  . Current smoker 01/11/2013  . Weight gain 06/26/2012  . Cervical stenosis (uterine cervix) 04/25/2012  . Anticardiolipin antibody positive   . Hyperlipidemia   . Hypertension   . History of trichomoniasis 01/18/2011    Class: History of  . THYROID NODULE, RIGHT 02/05/2008  . GERD 02/05/2008  . NECK PAIN 02/05/2008    Carney Living PT DPT  10/09/2019, 10:04 AM  [facility] [address] [place], [place], [address] Phone: [phone]   Fax:  [phone]  Name: Kelly [name] MRN: [id number] Date of Birth: Mar 02, 1966

## 2019-10-10 ENCOUNTER — Ambulatory Visit: Payer: 59 | Admitting: Physical Therapy

## 2019-10-11 DIAGNOSIS — R41840 Attention and concentration deficit: Secondary | ICD-10-CM | POA: Diagnosis not present

## 2019-10-11 DIAGNOSIS — F4312 Post-traumatic stress disorder, chronic: Secondary | ICD-10-CM | POA: Diagnosis not present

## 2019-10-11 MED FILL — OXYCODONE-ACETAMINOPHEN 5-3: 5-325 | 7 days supply | Qty: 30 | Fill #0

## 2019-10-15 ENCOUNTER — Other Ambulatory Visit: Payer: Self-pay

## 2019-10-15 ENCOUNTER — Ambulatory Visit: Payer: 59 | Admitting: Physical Therapy

## 2019-10-15 ENCOUNTER — Encounter: Payer: Self-pay | Admitting: Physical Therapy

## 2019-10-15 DIAGNOSIS — M542 Cervicalgia: Secondary | ICD-10-CM

## 2019-10-15 DIAGNOSIS — M62838 Other muscle spasm: Secondary | ICD-10-CM | POA: Diagnosis not present

## 2019-10-15 NOTE — Therapy (Signed)
[place], [place], [address] Phone: [phone]   Fax:  [phone]  Physical Therapy Treatment  Patient Details  Name: Kelly [name] MRN: [id number] Date of Birth: May 17, 1966 Referring Provider (PT): Dr Sherley Bounds    Encounter Date: 10/15/2019  PT End of Session - 10/15/19 1549    Visit Number  4    Number of Visits  12    Date for PT Re-Evaluation  11/11/19    Authorization Type  [facility] [employer]    PT Start Time  0930    PT Stop Time  1025    PT Time Calculation (min)  55 min    Activity Tolerance  Patient tolerated treatment well    Behavior During Therapy  [facility] for tasks assessed/performed       Past Medical History:  Diagnosis Date  . Anticardiolipin antibody positive   . HSV-2 (herpes simplex virus 2) infection   . Hyperlipidemia   . Hypertension    diet controlled  . Kidney stone   . Migraine   . Renal calculus, right   . Sleep apnea   . TMJ (dislocation of temporomandibular joint)     Past Surgical History:  Procedure Laterality Date  . CESAREAN SECTION     X3   LAST ONE 11-30-2004  . CYSTOSCOPY W/ RETROGRADES Right 10/29/2012   Procedure: CYSTOSCOPY WITH RETROGRADE PYELOGRAM and stent placement;  Surgeon: Reece Packer, MD;  Location: [facility];  Service: Urology;  Laterality: Right;  rt stent placement , rt retrograde and cysto   . CYSTOSCOPY W/ URETERAL STENT PLACEMENT Right 11/23/2012   Procedure: CYSTOSCOPY WITH STENT REPLACEMENT;  Surgeon: Alexis Frock, MD;  Location: [facility];  Service: Urology;  Laterality: Right;  . CYSTOSCOPY/RETROGRADE/URETEROSCOPY/STONE EXTRACTION WITH BASKET Right 11/23/2012   Procedure: CYSTOSCOPY/RETROGRADE/URETEROSCOPY/STONE EXTRACTION WITH BASKET;  Surgeon: Alexis Frock, MD;  Location: [facility];  Service: Urology;  Laterality: Right;    There were no vitals filed for this visit.  Subjective  Assessment - 10/15/19 1021    Subjective  Patient was sore the second day after she did her exercises the last time. She reports it is about the same as it was last visit.    Pertinent History  TMJ, migranes,    Limitations  Sitting    How long can you stand comfortably?  standing is OK    How long can you walk comfortably?  no real diffulty    Diagnostic tests  X-rays showed good fusion    Patient Stated Goals  Less pain/ To be able to go back to work    Currently in Pain?  Yes    Pain Score  3     Pain Location  Neck    Pain Orientation  Right;Left    Pain Descriptors / Indicators  Aching    Pain Type  Chronic pain    Pain Radiating Towards  into shoulder blades    Pain Onset  Today    Pain Frequency  Constant    Pain Relieving Factors  rest    Effect of Pain on Daily Activities  diffficulty working                       [facility] Adult PT Treatment/Exercise - 10/15/19 0001      Neck Exercises: Standing   Other Standing Exercises  scap retraction x20 red; shoulder extension x20 red  Neck Exercises: Supine   Other Supine Exercise  bilateral ER and horizontal abduction yellow 2x10 each with min cuing. Noincreased pain noted       Lumbar Exercises: Seated   Other Seated Lumbar Exercises  pulleys 2 min       Electrical Stimulation   Electrical Stimulation Location  cervical spine     Electrical Stimulation Action  IFC    Electrical Stimulation Parameters  to tolerance     Electrical Stimulation Goals  Pain      Manual Therapy   Manual Therapy  Passive ROM    Soft tissue mobilization  trigger point release to cervical parapinals, upper trap , IASTYM to upper trap and shoulder.     Manual Traction  sub-occipital release              PT Education - 10/15/19 1024    Education Details  reviewed ther-ex and posture    Person(s) Educated  Patient    Methods  Explanation;Demonstration;Tactile cues;Verbal cues    Comprehension  Verbalized  understanding;Returned demonstration;Verbal cues required;Tactile cues required       PT Short Term Goals - 09/27/19 1448      PT SHORT TERM GOAL #1   Title  Patient will be indepdent with relaxation and breathing techniques to reduce actue inflamtion.    Time  3    Period  Weeks    Status  New    Target Date  11/08/19      PT SHORT TERM GOAL #2   Title  Patient will report no radiating pain down both of her amrs    Baseline  intermitent depending on activity    Time  3    Period  Weeks    Status  New    Target Date  10/21/19      PT SHORT TERM GOAL #3   Title  Patient will increase gross bilateral UE strength to 4/5    Time  3    Period  Weeks    Status  New    Target Date  10/21/19        PT Long Term Goals - 09/27/19 1451      PT LONG TERM GOAL #1   Title  Patient will demonstrate 60 degrees of passive rotation ROM bilateral without pain    Baseline  60 bilateral    Time  6    Period  Weeks    Status  New    Target Date  12/25/20      PT LONG TERM GOAL #2   Title  Patient will sit at her desk for 4 hours without pain    Time  6    Period  Weeks    Status  New    Target Date  11/08/19      PT LONG TERM GOAL #3   Title  Patient will reach overhead to acabinet without pain    Time  6    Period  Weeks    Status  New    Target Date  11/11/19            Plan - 10/15/19 1550    Clinical Impression Statement  Patient continues to tolerate exercises but exercises were kept consitent today 2nd to increased pain after last visit. She continues to have signiifcant spasming in her neck, Therapy continues to work on manual therapy to tolerance. PT will continue to advqnce strengthening as tolerated.    Comorbidities  history of  cervical spine pain, TMJ    Examination-Activity Limitations  Lift;Reach Overhead;Locomotion Level    Examination-Participation Restrictions  Church    Stability/Clinical Decision Making  Evolving/Moderate complexity    Clinical  Decision Making  Moderate    Rehab Potential  Good    PT Frequency  2x / week    PT Duration  6 weeks    PT Treatment/Interventions  ADLs/Self Care Home Management;Cryotherapy;Electrical Stimulation;Iontophoresis 4mg /ml Dexamethasone;Moist Heat;Ultrasound;Traction;DME Instruction;Gait training;Stair training;Functional mobility training;Therapeutic activities;Therapeutic exercise;Neuromuscular re-education;Patient/family education;Manual techniques;Passive range of motion;Dry needling;Taping    PT Next Visit Plan  begin manual therapy on upper traps and cervical muscles.    PT Home Exercise Plan  scap retraction    Consulted and Agree with Plan of Care  Patient       Patient will benefit from skilled therapeutic intervention in order to improve the following deficits and impairments:  Pain, Increased muscle spasms, Decreased activity tolerance, Decreased strength, Decreased range of motion, Decreased endurance, Increased fascial restricitons  Visit Diagnosis: Cervicalgia  Other muscle spasm     Problem List Patient Active Problem List   Diagnosis Date Noted  . Stressful life event affecting family 07/12/2019  . Caregiver burden 07/12/2019  . Tobacco abuse counseling 07/12/2019  . Protrusion of cervical intervertebral disc 04/10/2019  . Spinal stenosis of cervical region 04/10/2019  . Overactive bladder 11/19/2018  . Photosensitization due to sun 11/02/2018  . Generalized pain 11/02/2018  . High risk medication use 11/02/2018  . Foot pain, bilateral 05/10/2018  . Heel pain, bilateral 05/10/2018  . Plantar fasciitis 05/10/2018  . Left knee pain 05/10/2018  . Patellofemoral pain syndrome of both knees 05/10/2018  . Generalized abdominal pain 04/17/2018  . Flank pain 04/17/2018  . Acute back pain 04/17/2018  . History of renal stone 04/17/2018  . Urinary frequency 04/17/2018  . Laceration of left hand 01/31/2018  . Decreased range of motion of finger of left hand 01/31/2018   . Localized swelling on left hand 01/31/2018  . Chronic migraine without aura without status migrainosus, not intractable 11/01/2016  . History of migraine 06/10/2016  . Diarrhea 05/20/2016  . Pain in the chest 05/11/2016  . Dizziness and giddiness 05/11/2016  . History of hypertension 05/11/2016  . Hemorrhoidal skin tag 04/07/2015  . IUD (intrauterine device) in place 05/03/2013  . Allergic rhinitis 01/11/2013  . Current smoker 01/11/2013  . Weight [name] 06/26/2012  . Cervical stenosis (uterine cervix) 04/25/2012  . Anticardiolipin antibody positive   . Hyperlipidemia   . Hypertension   . History of trichomoniasis 01/18/2011    Class: History of  . THYROID NODULE, RIGHT 02/05/2008  . GERD 02/05/2008  . NECK PAIN 02/05/2008    Carney Living PT DPT  10/15/2019, 3:53 PM  [facility] [address] [place], [place], [address] Phone: [phone]   Fax:  [phone]  Name: KARLESHA KARCHER MRN: [id number] Date of Birth: [birth year]

## 2019-10-16 ENCOUNTER — Other Ambulatory Visit: Payer: Self-pay | Admitting: Gastroenterology

## 2019-10-16 ENCOUNTER — Other Ambulatory Visit (HOSPITAL_COMMUNITY): Payer: Self-pay | Admitting: Gastroenterology

## 2019-10-16 DIAGNOSIS — Z1211 Encounter for screening for malignant neoplasm of colon: Secondary | ICD-10-CM | POA: Diagnosis not present

## 2019-10-16 DIAGNOSIS — K621 Rectal polyp: Secondary | ICD-10-CM | POA: Diagnosis not present

## 2019-10-16 DIAGNOSIS — K610 Anal abscess: Secondary | ICD-10-CM | POA: Diagnosis not present

## 2019-10-16 DIAGNOSIS — Z800 Family history of malignant neoplasm of digestive organs: Secondary | ICD-10-CM | POA: Diagnosis not present

## 2019-10-16 DIAGNOSIS — R933 Abnormal findings on diagnostic imaging of other parts of digestive tract: Secondary | ICD-10-CM

## 2019-10-17 ENCOUNTER — Ambulatory Visit: Payer: 59 | Admitting: Physical Therapy

## 2019-10-17 DIAGNOSIS — M542 Cervicalgia: Secondary | ICD-10-CM | POA: Diagnosis not present

## 2019-10-17 DIAGNOSIS — Z6829 Body mass index (BMI) 29.0-29.9, adult: Secondary | ICD-10-CM | POA: Diagnosis not present

## 2019-10-17 MED FILL — GABAPENTIN 100 MG CAPSULE: 100 | 12 days supply | Qty: 60 | Fill #0

## 2019-10-22 ENCOUNTER — Ambulatory Visit: Payer: 59 | Attending: Neurological Surgery | Admitting: Physical Therapy

## 2019-10-22 ENCOUNTER — Encounter: Payer: Self-pay | Admitting: Physical Therapy

## 2019-10-22 ENCOUNTER — Other Ambulatory Visit: Payer: Self-pay

## 2019-10-22 ENCOUNTER — Other Ambulatory Visit: Payer: Self-pay | Admitting: Medical

## 2019-10-22 DIAGNOSIS — M542 Cervicalgia: Secondary | ICD-10-CM | POA: Diagnosis not present

## 2019-10-22 DIAGNOSIS — M62838 Other muscle spasm: Secondary | ICD-10-CM | POA: Diagnosis not present

## 2019-10-22 DIAGNOSIS — M25512 Pain in left shoulder: Secondary | ICD-10-CM | POA: Diagnosis not present

## 2019-10-22 MED FILL — CYCLOBENZAPRINE HCL 5 MG TA: 5 | 20 days supply | Qty: 60 | Fill #1

## 2019-10-22 MED FILL — ESCITALOPRAM 10 MG TABLET: 10 | 30 days supply | Qty: 30 | Fill #1

## 2019-10-22 MED FILL — OXYCODONE-ACETAMINOPHEN 5-3: 5-325 | 7 days supply | Qty: 30 | Fill #0

## 2019-10-22 MED FILL — TOPIRAMATE 50 MG TABLET: 50 | 30 days supply | Qty: 60 | Fill #0

## 2019-10-23 ENCOUNTER — Ambulatory Visit (HOSPITAL_COMMUNITY)
Admission: RE | Admit: 2019-10-23 | Discharge: 2019-10-23 | Disposition: A | Discharge status: Home / Self Care | Payer: 59 | Source: Ambulatory Visit | Attending: Gastroenterology | Admitting: Gastroenterology

## 2019-10-23 ENCOUNTER — Encounter: Payer: Self-pay | Admitting: Physical Therapy

## 2019-10-23 DIAGNOSIS — K603 Anal fistula: Secondary | ICD-10-CM | POA: Diagnosis not present

## 2019-10-23 DIAGNOSIS — R933 Abnormal findings on diagnostic imaging of other parts of digestive tract: Secondary | ICD-10-CM | POA: Insufficient documentation

## 2019-10-23 LAB — POCT I-STAT CREATININE: Creatinine, Ser: 0.8 mg/dL (ref 0.44–1.00)

## 2019-10-23 MED ORDER — GADOBUTROL 1 MMOL/ML IV SOLN
8.0000 mL | Freq: Once | INTRAVENOUS | Status: AC | PRN
  Administered 2019-10-23: 8 mL via INTRAVENOUS

## 2019-10-23 NOTE — Therapy (Signed)
[place], [place], [address] Phone: [phone]   Fax:  [phone]  Physical Therapy Treatment  Patient Details  Name: Kelly [name] MRN: [id number] Date of Birth: May 09, 1966 Referring Provider (PT): Dr Sherley Bounds    Encounter Date: 10/22/2019  PT End of Session - 10/22/19 1052    Visit Number  5    Number of Visits  12    Date for PT Re-Evaluation  11/11/19    Authorization Type  [facility]    PT Start Time  1015    PT Stop Time  1113    PT Time Calculation (min)  58 min    Activity Tolerance  Patient tolerated treatment well    Behavior During Therapy  [facility] for tasks assessed/performed       Past Medical History:  Diagnosis Date  . Anticardiolipin antibody positive   . HSV-2 (herpes simplex virus 2) infection   . Hyperlipidemia   . Hypertension    diet controlled  . Kidney stone   . Migraine   . Renal calculus, right   . Sleep apnea   . TMJ (dislocation of temporomandibular joint)     Past Surgical History:  Procedure Laterality Date  . CESAREAN SECTION     X3   LAST ONE 11-30-2004  . CYSTOSCOPY W/ RETROGRADES Right 10/29/2012   Procedure: CYSTOSCOPY WITH RETROGRADE PYELOGRAM and stent placement;  Surgeon: Reece Packer, MD;  Location: [place];  Service: Urology;  Laterality: Right;  rt stent placement , rt retrograde and cysto   . CYSTOSCOPY W/ URETERAL STENT PLACEMENT Right 11/23/2012   Procedure: CYSTOSCOPY WITH STENT REPLACEMENT;  Surgeon: Alexis Frock, MD;  Location: [facility];  Service: Urology;  Laterality: Right;  . CYSTOSCOPY/RETROGRADE/URETEROSCOPY/STONE EXTRACTION WITH BASKET Right 11/23/2012   Procedure: CYSTOSCOPY/RETROGRADE/URETEROSCOPY/STONE EXTRACTION WITH BASKET;  Surgeon: Alexis Frock, MD;  Location: [facility];  Service: Urology;  Laterality: Right;    There were no vitals filed for this visit.  Subjective Assessment  - 10/22/19 1027    Subjective  Patient has been to the MD. He wants her to continue with her PT. She feels like her pain is getting better. She is still having numbness with activity.    Limitations  Sitting    How long can you stand comfortably?  standing is OK    How long can you walk comfortably?  no real diffulty    Diagnostic tests  X-rays showed good fusion    Patient Stated Goals  Less pain/ To be able to go back to work    Currently in Pain?  Yes    Pain Score  3     Pain Location  Neck    Pain Orientation  Right    Pain Descriptors / Indicators  Aching    Pain Type  Chronic pain    Pain Onset  Today    Pain Frequency  Constant    Multiple Pain Sites  No         [facility] PT Assessment - 10/23/19 0001      Assessment   Medical Diagnosis  --    Referring Provider (PT)  --    Onset Date/Surgical Date  --    Hand Dominance  --    Next MD Visit  --    Prior Therapy  --      Precautions   Precautions  --    Precaution Comments  --  Restrictions   Weight Bearing Restrictions  --      Home Environment   Living Environment  --    Living Arrangements  --    Available Help at Discharge  --    Type of Home  --    Home Access  --    Entrance Stairs-Number of Steps  --      Prior Function   Level of Independence  --    Vocation  --    Vocation Requirements  --      Cognition   Overall Cognitive Status  --    Attention  --    Focused Attention  --    Memory  --    Awareness  --    Problem Solving  --      Observation/Other Assessments   Focus on Therapeutic Outcomes (FOTO)   52% limitation       Sensation   Light Touch  Appears Intact    Additional Comments  parathesias into bilateral pinkies several times a day.       Coordination   Gross Motor Movements are Fluid and Coordinated  Yes    Fine Motor Movements are Fluid and Coordinated  Yes      AROM   Overall AROM Comments  bilateral adduction with elbows bent 3/5;     Cervical Flexion  30    Cervical  Extension  15    Cervical - Right Rotation  30    Cervical - Left Rotation  50      Strength   Right Shoulder Flexion  --    Right Shoulder Internal Rotation  --    Right Shoulder External Rotation  --    Left Shoulder Flexion  --    Left Shoulder Internal Rotation  --    Left Shoulder External Rotation  --      Palpation   Palpation comment  significant spasming of the upper traps                    [facility] Adult PT Treatment/Exercise - 10/23/19 0001      Neck Exercises: Standing   Other Standing Exercises  scap retraction x20 red; shoulder extension x20 red      Neck Exercises: Supine   Other Supine Exercise  bilateral ER and horizontal abduction red 2x10 each with min cuing.       Lumbar Exercises: Seated   Other Seated Lumbar Exercises  pulleys 2 min       Electrical Stimulation   Electrical Stimulation Location  cervical spine     Electrical Stimulation Action  IFC     Electrical Stimulation Parameters  to tolerance     Electrical Stimulation Goals  Pain      Manual Therapy   Manual Therapy  Passive ROM    Soft tissue mobilization  trigger point release to cervical parapinals, upper trap , IASTYM to upper trap and shoulder.     Manual Traction  sub-occipital release              PT Education - 10/23/19 0824    Education Details  reviewed porpper posture    Methods  Explanation;Demonstration;Tactile cues;Verbal cues    Comprehension  Verbalized understanding;Returned demonstration;Verbal cues required;Tactile cues required       PT Short Term Goals - 09/27/19 1448      PT SHORT TERM GOAL #1   Title  Patient will be indepdent with relaxation and breathing techniques to  reduce actue inflamtion.    Time  3    Period  Weeks    Status  New    Target Date  11/08/19      PT SHORT TERM GOAL #2   Title  Patient will report no radiating pain down both of her amrs    Baseline  intermitent depending on activity    Time  3    Period  Weeks    Status   New    Target Date  10/21/19      PT SHORT TERM GOAL #3   Title  Patient will increase gross bilateral UE strength to 4/5    Time  3    Period  Weeks    Status  New    Target Date  10/21/19        PT Long Term Goals - 09/27/19 1451      PT LONG TERM GOAL #1   Title  Patient will demonstrate 60 degrees of passive rotation ROM bilateral without pain    Baseline  60 bilateral    Time  6    Period  Weeks    Status  New    Target Date  12/25/20      PT LONG TERM GOAL #2   Title  Patient will sit at her desk for 4 hours without pain    Time  6    Period  Weeks    Status  New    Target Date  11/08/19      PT LONG TERM GOAL #3   Title  Patient will reach overhead to acabinet without pain    Time  6    Period  Weeks    Status  New    Target Date  11/11/19            Plan - 10/23/19 [id number]    Clinical Impression Statement  Therapy is slowly advanacing activity. She tolerated well. She had no increase in pain with exercises. She was advised to continue to avoid activity that irritates her and to continue to let her spine heal.    Personal Factors and Comorbidities  Comorbidity 1;Comorbidity 2;Profession    Comorbidities  history of cervical spine pain, TMJ    Examination-Activity Limitations  Lift;Reach Overhead;Locomotion Level    Examination-Participation Restrictions  Church;Cleaning;Community Activity;Laundry;Meal Prep    Stability/Clinical Decision Making  Evolving/Moderate complexity    Clinical Decision Making  Moderate    Rehab Potential  Good    PT Frequency  2x / week    PT Treatment/Interventions  ADLs/Self Care Home Management;Cryotherapy;Electrical Stimulation;Iontophoresis 4mg /ml Dexamethasone;Moist Heat;Ultrasound;Traction;DME Instruction;Gait training;Stair training;Functional mobility training;Therapeutic activities;Therapeutic exercise;Neuromuscular re-education;Patient/family education;Manual techniques;Passive range of motion;Dry needling;Taping    PT  Next Visit Plan  begin manual therapy on upper traps and cervical muscles.    PT Home Exercise Plan  scap retraction    Consulted and Agree with Plan of Care  Patient       Patient will benefit from skilled therapeutic intervention in order to improve the following deficits and impairments:  Pain, Increased muscle spasms, Decreased activity tolerance, Decreased strength, Decreased range of motion, Decreased endurance, Increased fascial restricitons  Visit Diagnosis: Cervicalgia  Other muscle spasm     Problem List Patient Active Problem List   Diagnosis Date Noted  . Stressful life event affecting family 07/12/2019  . Caregiver burden 07/12/2019  . Tobacco abuse counseling 07/12/2019  . Protrusion of cervical intervertebral disc 04/10/2019  . Spinal stenosis of cervical  region 04/10/2019  . Overactive bladder 11/19/2018  . Photosensitization due to sun 11/02/2018  . Generalized pain 11/02/2018  . High risk medication use 11/02/2018  . Foot pain, bilateral 05/10/2018  . Heel pain, bilateral 05/10/2018  . Plantar fasciitis 05/10/2018  . Left knee pain 05/10/2018  . Patellofemoral pain syndrome of both knees 05/10/2018  . Generalized abdominal pain 04/17/2018  . Flank pain 04/17/2018  . Acute back pain 04/17/2018  . History of renal stone 04/17/2018  . Urinary frequency 04/17/2018  . Laceration of left hand 01/31/2018  . Decreased range of motion of finger of left hand 01/31/2018  . Localized swelling on left hand 01/31/2018  . Chronic migraine without aura without status migrainosus, not intractable 11/01/2016  . History of migraine 06/10/2016  . Diarrhea 05/20/2016  . Pain in the chest 05/11/2016  . Dizziness and giddiness 05/11/2016  . History of hypertension 05/11/2016  . Hemorrhoidal skin tag 04/07/2015  . IUD (intrauterine device) in place 05/03/2013  . Allergic rhinitis 01/11/2013  . Current smoker 01/11/2013  . Weight gain 06/26/2012  . Cervical stenosis  (uterine cervix) 04/25/2012  . Anticardiolipin antibody positive   . Hyperlipidemia   . Hypertension   . History of trichomoniasis 01/18/2011    Class: History of  . THYROID NODULE, RIGHT 02/05/2008  . GERD 02/05/2008  . NECK PAIN 02/05/2008    Carney Living PT DPT  10/23/2019, 8:34 AM  [facility] [address] [place], [place], [address] Phone: [phone]   Fax:  [phone]  Name: VISTA LEAZER MRN: [id number] Date of Birth: 09-Sep-1965

## 2019-10-24 ENCOUNTER — Other Ambulatory Visit: Payer: Self-pay

## 2019-10-24 ENCOUNTER — Encounter: Payer: Self-pay | Admitting: Physical Therapy

## 2019-10-24 ENCOUNTER — Ambulatory Visit: Payer: 59 | Admitting: Physical Therapy

## 2019-10-24 DIAGNOSIS — M542 Cervicalgia: Secondary | ICD-10-CM | POA: Diagnosis not present

## 2019-10-24 DIAGNOSIS — M25512 Pain in left shoulder: Secondary | ICD-10-CM | POA: Diagnosis not present

## 2019-10-24 DIAGNOSIS — M62838 Other muscle spasm: Secondary | ICD-10-CM | POA: Diagnosis not present

## 2019-10-24 NOTE — Therapy (Signed)
[facility] [place], [place], [address] Phone: [phone]   Fax:  [phone]  Physical Therapy Treatment  Patient Details  Name: Kelly [name] MRN: [id number] Date of Birth: 1966/02/24 Referring Provider (PT): Dr Sherley Bounds    Encounter Date: 10/24/2019  PT End of Session - 10/24/19 1046    Visit Number  5    Number of Visits  12    Date for PT Re-Evaluation  11/11/19    Authorization Type  [facility]    PT Start Time  1018    PT Stop Time  1115    PT Time Calculation (min)  57 min    Activity Tolerance  Patient tolerated treatment well    Behavior During Therapy  [facility] for tasks assessed/performed       Past Medical History:  Diagnosis Date  . Anticardiolipin antibody positive   . HSV-2 (herpes simplex virus 2) infection   . Hyperlipidemia   . Hypertension    diet controlled  . Kidney stone   . Migraine   . Renal calculus, right   . Sleep apnea   . TMJ (dislocation of temporomandibular joint)     Past Surgical History:  Procedure Laterality Date  . CESAREAN SECTION     X3   LAST ONE 11-30-2004  . CYSTOSCOPY W/ RETROGRADES Right 10/29/2012   Procedure: CYSTOSCOPY WITH RETROGRADE PYELOGRAM and stent placement;  Surgeon: Reece Packer, MD;  Location: [place];  Service: Urology;  Laterality: Right;  rt stent placement , rt retrograde and cysto   . CYSTOSCOPY W/ URETERAL STENT PLACEMENT Right 11/23/2012   Procedure: CYSTOSCOPY WITH STENT REPLACEMENT;  Surgeon: Alexis Frock, MD;  Location: [facility];  Service: Urology;  Laterality: Right;  . CYSTOSCOPY/RETROGRADE/URETEROSCOPY/STONE EXTRACTION WITH BASKET Right 11/23/2012   Procedure: CYSTOSCOPY/RETROGRADE/URETEROSCOPY/STONE EXTRACTION WITH BASKET;  Surgeon: Alexis Frock, MD;  Location: [facility];  Service: Urology;  Laterality: Right;    There were no vitals filed for this visit.  Subjective Assessment  - 10/24/19 1043    Subjective  Patient was sore after the last visait but feels like it is getting a little better. She has had less numbness. She is a little irritated htis morning because she has been peeling potatoes last night.    Pertinent History  TMJ, migranes,    Limitations  Sitting    How long can you stand comfortably?  standing is OK    How long can you walk comfortably?  no real diffulty    Diagnostic tests  X-rays showed good fusion    Patient Stated Goals  Less pain/ To be able to go back to work    Currently in Pain?  Yes    Pain Score  2     Pain Location  Neck    Pain Orientation  Right    Pain Descriptors / Indicators  Aching    Pain Type  Chronic pain    Pain Onset  Today    Pain Frequency  Constant    Pain Relieving Factors  rest    Effect of Pain on Daily Activities  difficulty working                       [facility] Adult PT Treatment/Exercise - 10/24/19 0001      Neck Exercises: Standing   Other Standing Exercises  scap retraction x20 red; shoulder extension x20 red  Neck Exercises: Supine   Other Supine Exercise  bilateral ER and horizontal abduction red 2x10 each with min cuing.  wand flexion 2x10 mild numbness in the right hand.       Lumbar Exercises: Seated   Other Seated Lumbar Exercises  pulleys 2 min       Electrical Stimulation   Electrical Stimulation Location  cervical spine     Electrical Stimulation Action  IFC     Electrical Stimulation Parameters  to tolerance     Electrical Stimulation Goals  Pain      Manual Therapy   Manual Therapy  Passive ROM    Soft tissue mobilization  trigger point release to cervical parapinals, upper trap , IASTYM to upper trap and shoulder.     Manual Traction  sub-occipital release              PT Education - 10/24/19 1046    Education Details  HEP and symptom mangement    Person(s) Educated  Patient    Methods  Explanation;Demonstration;Tactile cues;Verbal cues    Comprehension   Verbalized understanding;Returned demonstration;Verbal cues required;Tactile cues required       PT Short Term Goals - 10/24/19 1212      PT SHORT TERM GOAL #1   Title  Patient will be indepdent with relaxation and breathing techniques to reduce actue inflamtion.    Baseline  independent    Time  3    Period  Weeks    Status  On-going    Target Date  11/08/19      PT SHORT TERM GOAL #2   Title  Patient will report no radiating pain down both of her amrs    Baseline  intermitent depending on activity    Time  3    Period  Weeks    Status  On-going    Target Date  10/21/19      PT SHORT TERM GOAL #3   Title  Patient will increase gross bilateral UE strength to 4/5    Baseline  3/10 today    Period  Weeks    Status  On-going    Target Date  10/21/19        PT Long Term Goals - 09/27/19 1451      PT LONG TERM GOAL #1   Title  Patient will demonstrate 60 degrees of passive rotation ROM bilateral without pain    Baseline  60 bilateral    Time  6    Period  Weeks    Status  New    Target Date  12/25/20      PT LONG TERM GOAL #2   Title  Patient will sit at her desk for 4 hours without pain    Time  6    Period  Weeks    Status  New    Target Date  11/08/19      PT LONG TERM GOAL #3   Title  Patient will reach overhead to acabinet without pain    Time  6    Period  Weeks    Status  New    Target Date  11/11/19            Plan - 10/24/19 1211    Clinical Impression Statement  Therapy will continue to advance exercises as tolerated. She had mild numbness with cane flexion. She continues to have muscle tightness in the cervical spine.    Personal Factors and Comorbidities  Comorbidity 1;Comorbidity 2;Profession  Comorbidities  history of cervical spine pain, TMJ    Examination-Activity Limitations  Lift;Reach Overhead;Locomotion Level    Examination-Participation Restrictions  Church;Cleaning;Community Activity;Laundry;Meal Prep    Stability/Clinical  Decision Making  Evolving/Moderate complexity    Clinical Decision Making  Moderate    Rehab Potential  Good    PT Frequency  2x / week    PT Duration  6 weeks    PT Treatment/Interventions  ADLs/Self Care Home Management;Cryotherapy;Electrical Stimulation;Iontophoresis 4mg /ml Dexamethasone;Moist Heat;Ultrasound;Traction;DME Instruction;Gait training;Stair training;Functional mobility training;Therapeutic activities;Therapeutic exercise;Neuromuscular re-education;Patient/family education;Manual techniques;Passive range of motion;Dry needling;Taping    PT Next Visit Plan  begin manual therapy on upper traps and cervical muscles.    PT Home Exercise Plan  scap retraction    Consulted and Agree with Plan of Care  Patient       Patient will benefit from skilled therapeutic intervention in order to improve the following deficits and impairments:  Pain, Increased muscle spasms, Decreased activity tolerance, Decreased strength, Decreased range of motion, Decreased endurance, Increased fascial restricitons  Visit Diagnosis: Cervicalgia  Other muscle spasm  Acute pain of left shoulder     Problem List Patient Active Problem List   Diagnosis Date Noted  . Stressful life event affecting family 07/12/2019  . Caregiver burden 07/12/2019  . Tobacco abuse counseling 07/12/2019  . Protrusion of cervical intervertebral disc 04/10/2019  . Spinal stenosis of cervical region 04/10/2019  . Overactive bladder 11/19/2018  . Photosensitization due to sun 11/02/2018  . Generalized pain 11/02/2018  . High risk medication use 11/02/2018  . Foot pain, bilateral 05/10/2018  . Heel pain, bilateral 05/10/2018  . Plantar fasciitis 05/10/2018  . Left knee pain 05/10/2018  . Patellofemoral pain syndrome of both knees 05/10/2018  . Generalized abdominal pain 04/17/2018  . Flank pain 04/17/2018  . Acute back pain 04/17/2018  . History of renal stone 04/17/2018  . Urinary frequency 04/17/2018  . Laceration  of left hand 01/31/2018  . Decreased range of motion of finger of left hand 01/31/2018  . Localized swelling on left hand 01/31/2018  . Chronic migraine without aura without status migrainosus, not intractable 11/01/2016  . History of migraine 06/10/2016  . Diarrhea 05/20/2016  . Pain in the chest 05/11/2016  . Dizziness and giddiness 05/11/2016  . History of hypertension 05/11/2016  . Hemorrhoidal skin tag 04/07/2015  . IUD (intrauterine device) in place 05/03/2013  . Allergic rhinitis 01/11/2013  . Current smoker 01/11/2013  . Weight gain 06/26/2012  . Cervical stenosis (uterine cervix) 04/25/2012  . Anticardiolipin antibody positive   . Hyperlipidemia   . Hypertension   . History of trichomoniasis 01/18/2011    Class: History of  . THYROID NODULE, RIGHT 02/05/2008  . GERD 02/05/2008  . NECK PAIN 02/05/2008    Carney Living  PT DPT  10/24/2019, 12:14 PM  [facility] [address] [place], [place], [address] Phone: [phone]   Fax:  [phone]  Name: LINAE MURA MRN: [id number] Date of Birth: 1966/02/22

## 2019-10-31 ENCOUNTER — Encounter: Payer: Self-pay | Admitting: Obstetrics & Gynecology

## 2019-10-31 DIAGNOSIS — M542 Cervicalgia: Secondary | ICD-10-CM | POA: Diagnosis not present

## 2019-10-31 DIAGNOSIS — Z6829 Body mass index (BMI) 29.0-29.9, adult: Secondary | ICD-10-CM | POA: Diagnosis not present

## 2019-10-31 DIAGNOSIS — Z1231 Encounter for screening mammogram for malignant neoplasm of breast: Secondary | ICD-10-CM | POA: Diagnosis not present

## 2019-11-01 ENCOUNTER — Encounter: Payer: Self-pay | Admitting: Obstetrics & Gynecology

## 2019-11-01 ENCOUNTER — Ambulatory Visit (INDEPENDENT_AMBULATORY_CARE_PROVIDER_SITE_OTHER): Payer: 59 | Admitting: Obstetrics & Gynecology

## 2019-11-01 ENCOUNTER — Other Ambulatory Visit: Payer: Self-pay

## 2019-11-01 VITALS — BP 126/78 | Ht 63.5 in | Wt 170.0 lb

## 2019-11-01 DIAGNOSIS — Z113 Encounter for screening for infections with a predominantly sexual mode of transmission: Secondary | ICD-10-CM | POA: Diagnosis not present

## 2019-11-01 DIAGNOSIS — E663 Overweight: Secondary | ICD-10-CM | POA: Diagnosis not present

## 2019-11-01 DIAGNOSIS — Z30431 Encounter for routine checking of intrauterine contraceptive device: Secondary | ICD-10-CM

## 2019-11-01 DIAGNOSIS — Z1151 Encounter for screening for human papillomavirus (HPV): Secondary | ICD-10-CM

## 2019-11-01 DIAGNOSIS — Z01419 Encounter for gynecological examination (general) (routine) without abnormal findings: Secondary | ICD-10-CM | POA: Diagnosis not present

## 2019-11-01 DIAGNOSIS — R87618 Other abnormal cytological findings on specimens from cervix uteri: Secondary | ICD-10-CM | POA: Diagnosis not present

## 2019-11-01 DIAGNOSIS — F4312 Post-traumatic stress disorder, chronic: Secondary | ICD-10-CM | POA: Diagnosis not present

## 2019-11-01 MED FILL — PREGABALIN 50 MG CAPS: 50 | 30 days supply | Qty: 60 | Fill #0
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
## 2019-11-02 ENCOUNTER — Encounter: Payer: Self-pay | Admitting: Obstetrics & Gynecology
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
## 2019-11-04 ENCOUNTER — Other Ambulatory Visit: Payer: Self-pay | Admitting: Neurological Surgery

## 2019-11-04 DIAGNOSIS — M542 Cervicalgia: Secondary | ICD-10-CM

## 2019-11-04 LAB — TSH: TSH: 2.9 mIU/L

## 2019-11-04 LAB — COMPREHENSIVE METABOLIC PANEL
AG Ratio: 1.7 (calc) (ref 1.0–2.5)
ALT: 11 U/L (ref 6–29)
AST: 12 U/L (ref 10–35)
Albumin: 3.9 g/dL (ref 3.6–5.1)
Alkaline phosphatase (APISO): 101 U/L (ref 37–153)
BUN: 11 mg/dL (ref 7–25)
CO2: 26 mmol/L (ref 20–32)
Calcium: 9.3 mg/dL (ref 8.6–10.4)
Chloride: 106 mmol/L (ref 98–110)
Creat: 0.76 mg/dL (ref 0.50–1.05)
Globulin: 2.3 g/dL (calc) (ref 1.9–3.7)
Glucose, Bld: 80 mg/dL (ref 65–99)
Potassium: 4 mmol/L (ref 3.5–5.3)
Sodium: 138 mmol/L (ref 135–146)
Total Bilirubin: 0.4 mg/dL (ref 0.2–1.2)
Total Protein: 6.2 g/dL (ref 6.1–8.1)

## 2019-11-04 LAB — CBC
HCT: 37.5 % (ref 35.0–45.0)
Hemoglobin: 12.9 g/dL (ref 11.7–15.5)
MCH: 31.9 pg (ref 27.0–33.0)
MCHC: 34.4 g/dL (ref 32.0–36.0)
MCV: 92.8 fL (ref 80.0–100.0)
MPV: 9.5 fL (ref 7.5–12.5)
Platelets: 288 10*3/uL (ref 140–400)
RBC: 4.04 10*6/uL (ref 3.80–5.10)
RDW: 12.5 % (ref 11.0–15.0)
WBC: 7.4 10*3/uL (ref 3.8–10.8)

## 2019-11-04 LAB — RPR: RPR Ser Ql: NONREACTIVE

## 2019-11-04 LAB — HEPATITIS C ANTIBODY
Hepatitis C Ab: NONREACTIVE
SIGNAL TO CUT-OFF: 0.01 (ref <1.00)

## 2019-11-04 LAB — VITAMIN D 25 HYDROXY (VIT D DEFICIENCY, FRACTURES): Vit D, 25-Hydroxy: 30 ng/mL (ref 30–100)

## 2019-11-04 LAB — LIPID PANEL
Cholesterol: 196 mg/dL (ref <200)
HDL: 47 mg/dL — ABNORMAL LOW (ref >=50)
LDL Cholesterol (Calc): 117 mg/dL (calc) — ABNORMAL HIGH
Non-HDL Cholesterol (Calc): 149 mg/dL (calc) — ABNORMAL HIGH (ref <130)
Total CHOL/HDL Ratio: 4.2 (calc) (ref <5.0)
Triglycerides: 196 mg/dL — ABNORMAL HIGH (ref <150)

## 2019-11-04 LAB — HIV ANTIBODY (ROUTINE TESTING W REFLEX): HIV 1&2 Ab, 4th Generation: NONREACTIVE

## 2019-11-04 LAB — HEPATITIS B SURFACE ANTIGEN: Hepatitis B Surface Ag: NONREACTIVE

## 2019-11-04 MED FILL — OXYCODONE-ACETAMINOPHEN 5-3: 5-325 | 7 days supply | Qty: 30 | Fill #0

## 2019-11-05 LAB — PAP IG, CT-NG NAA, HPV HIGH-RISK
C. trachomatis RNA, TMA: NOT DETECTED
HPV DNA High Risk: NOT DETECTED
N. gonorrhoeae RNA, TMA: NOT DETECTED

## 2019-11-08 ENCOUNTER — Ambulatory Visit: Payer: 59 | Admitting: Physical Therapy

## 2019-11-13 ENCOUNTER — Other Ambulatory Visit: Payer: Self-pay

## 2019-11-13 ENCOUNTER — Ambulatory Visit: Payer: 59 | Admitting: Physical Therapy

## 2019-11-13 ENCOUNTER — Encounter: Payer: Self-pay | Admitting: Physical Therapy

## 2019-11-13 DIAGNOSIS — M25512 Pain in left shoulder: Secondary | ICD-10-CM | POA: Diagnosis not present

## 2019-11-13 DIAGNOSIS — M542 Cervicalgia: Secondary | ICD-10-CM | POA: Diagnosis not present

## 2019-11-13 DIAGNOSIS — M62838 Other muscle spasm: Secondary | ICD-10-CM | POA: Diagnosis not present

## 2019-11-13 NOTE — Therapy (Signed)
[place], [place], [address] Phone: [phone]   Fax:  [phone]  Physical Therapy Treatment  Patient Details  Name: Kelly [name] MRN: [id number] Date of Birth: 1965/09/23 Referring Provider (PT): Dr Sherley Bounds    Encounter Date: 11/13/2019  PT End of Session - 11/13/19 1250    Visit Number  6    Number of Visits  12    Date for PT Re-Evaluation  12/25/19    Authorization Type  [facility]    PT Start Time  [id number]    PT Stop Time  [id number]    PT Time Calculation (min)  56 min    Activity Tolerance  Patient tolerated treatment well    Behavior During Therapy  [facility] for tasks assessed/performed       Past Medical History:  Diagnosis Date  . Anticardiolipin antibody positive   . HSV-2 (herpes simplex virus 2) infection   . Hyperlipidemia   . Hypertension    diet controlled  . Kidney stone   . Migraine   . Renal calculus, right   . Sleep apnea   . TMJ (dislocation of temporomandibular joint)     Past Surgical History:  Procedure Laterality Date  . CERVICAL SPINE SURGERY  07/2019   titanium plate/screws   . CESAREAN SECTION     X3   LAST ONE 11-30-2004  . CYSTOSCOPY W/ RETROGRADES Right 10/29/2012   Procedure: CYSTOSCOPY WITH RETROGRADE PYELOGRAM and stent placement;  Surgeon: Reece Packer, MD;  Location: [place];  Service: Urology;  Laterality: Right;  rt stent placement , rt retrograde and cysto   . CYSTOSCOPY W/ URETERAL STENT PLACEMENT Right 11/23/2012   Procedure: CYSTOSCOPY WITH STENT REPLACEMENT;  Surgeon: Alexis Frock, MD;  Location: [facility];  Service: Urology;  Laterality: Right;  . CYSTOSCOPY/RETROGRADE/URETEROSCOPY/STONE EXTRACTION WITH BASKET Right 11/23/2012   Procedure: CYSTOSCOPY/RETROGRADE/URETEROSCOPY/STONE EXTRACTION WITH BASKET;  Surgeon: Alexis Frock, MD;  Location: [facility];  Service: Urology;  Laterality: Right;     There were no vitals filed for this visit.  Subjective Assessment - 11/13/19 1610    Pertinent History  TMJ, migranes,    Limitations  Sitting    How long can you walk comfortably?  no real diffulty         [facility] PT Assessment - 11/13/19 0001      Sensation   Additional Comments  continues to have cold feeling and numbness into her fingers       AROM   Right Shoulder Flexion  120 Degrees   120 with radicular symptoms    Left Shoulder Flexion  120 Degrees    Cervical Flexion  33    Cervical Extension  20    Cervical - Right Rotation  40    Cervical - Left Rotation  56      Strength   Right Shoulder Flexion  4/5    Right Shoulder Internal Rotation  4/5    Right Shoulder External Rotation  4/5    Left Shoulder Flexion  4/5    Left Shoulder Internal Rotation  4/5    Left Shoulder External Rotation  4/5      Palpation   Palpation comment  mild improvement in spasming and radicular symptoms                    [facility] Adult PT Treatment/Exercise - 11/13/19 0001      Neck Exercises:  Standing   Other Standing Exercises  scap retraction x20 red; shoulder extension x20 red      Neck Exercises: Supine   Other Supine Exercise  bilateral ER and horizontal abduction red 2x10 each with min cuing.  wand flexion 2x10 mild numbness in the right hand.       Lumbar Exercises: Standing   Other Standing Lumbar Exercises  scap retraction cx20 shoulder extension x20 red       Lumbar Exercises: Seated   Other Seated Lumbar Exercises  pulleys 2 min     Other Seated Lumbar Exercises  bilateral er red 2x10; horizontal abduction x10 red       Electrical Stimulation   Electrical Stimulation Location  cervical spine     Electrical Stimulation Action  IFC     Electrical Stimulation Parameters  to tolerance     Electrical Stimulation Goals  Pain      Manual Therapy   Manual Therapy  Passive ROM    Manual therapy comments  1st rib mobilization; no change in symptoms     Soft  tissue mobilization  trigger point release to cervical parapinals, upper trap , IASTYM to upper trap and shoulder.     Manual Traction  sub-occipital release                PT Short Term Goals - 10/24/19 1212      PT SHORT TERM GOAL #1   Title  Patient will be indepdent with relaxation and breathing techniques to reduce actue inflamtion.    Baseline  independent    Time  3    Period  Weeks    Status  On-going    Target Date  11/08/19      PT SHORT TERM GOAL #2   Title  Patient will report no radiating pain down both of her amrs    Baseline  intermitent depending on activity    Time  3    Period  Weeks    Status  On-going    Target Date  10/21/19      PT SHORT TERM GOAL #3   Title  Patient will increase gross bilateral UE strength to 4/5    Baseline  3/10 today    Period  Weeks    Status  On-going    Target Date  10/21/19        PT Long Term Goals - 11/13/19 1615      PT LONG TERM GOAL #1   Title  Patient will demonstrate 60 degrees of passive rotation ROM bilateral without pain    Baseline  improved but not 60    Time  6    Period  Weeks    Status  On-going      PT LONG TERM GOAL #2   Title  Patient will sit at her desk for 4 hours without pain    Baseline  incfrease pain as she sits    Time  6    Period  Weeks    Status  On-going      PT LONG TERM GOAL #3   Title  Patient will reach overhead to acabinet without pain    Baseline  patients hand goes numb    Time  6    Period  Weeks    Status  On-going      PT LONG TERM GOAL #4   Title  increase FOTO score to </= 35% limited to demo improvement in function  Baseline  not taken    Time  6    Period  Weeks    Status  On-going    Target Date  12/25/19      PT LONG TERM GOAL #5   Title  pt to be I with all HEP given as of last visit to maintain and progress current level of function     Baseline  working on her exercises    Time  6    Period  Weeks    Status  On-going            Plan  - 11/13/19 1612    Clinical Impression Statement  Patient is making slow progress but she is making dsome progress. She had less tenderness to palpation today. She continues to have numbness and a cold feeling into her hand. Therapy continues to work on light manual therapy and light functional strengthening. When she wnet without therapy she had increased pain and decreased function. Therapy wqill continue 2W6 to continue to progress patients ability to tolerate ADL's and IADl's    Personal Factors and Comorbidities  Comorbidity 1;Comorbidity 2;Profession    Examination-Activity Limitations  Lift;Reach Overhead;Locomotion Level    Examination-Participation Restrictions  Church;Cleaning;Community Activity;Laundry;Meal Prep    Stability/Clinical Decision Making  Evolving/Moderate complexity    Clinical Decision Making  Moderate    Rehab Potential  Good    PT Frequency  2x / week    PT Duration  6 weeks    PT Treatment/Interventions  ADLs/Self Care Home Management;Cryotherapy;Electrical Stimulation;Iontophoresis 4mg /ml Dexamethasone;Moist Heat;Ultrasound;Traction;DME Instruction;Gait training;Stair training;Functional mobility training;Therapeutic activities;Therapeutic exercise;Neuromuscular re-education;Patient/family education;Manual techniques;Passive range of motion;Dry needling;Taping    PT Next Visit Plan  begin manual therapy on upper traps and cervical muscles. Continue to progress activity as tolerated    PT Home Exercise Plan  scap retraction       Patient will benefit from skilled therapeutic intervention in order to improve the following deficits and impairments:  Pain, Increased muscle spasms, Decreased activity tolerance, Decreased strength, Decreased range of motion, Decreased endurance, Increased fascial restricitons  Visit Diagnosis: Cervicalgia  Other muscle spasm     Problem List Patient Active Problem List   Diagnosis Date Noted  . Stressful life event affecting family  07/12/2019  . Caregiver burden 07/12/2019  . Tobacco abuse counseling 07/12/2019  . Protrusion of cervical intervertebral disc 04/10/2019  . Spinal stenosis of cervical region 04/10/2019  . Overactive bladder 11/19/2018  . Photosensitization due to sun 11/02/2018  . Generalized pain 11/02/2018  . High risk medication use 11/02/2018  . Foot pain, bilateral 05/10/2018  . Heel pain, bilateral 05/10/2018  . Plantar fasciitis 05/10/2018  . Left knee pain 05/10/2018  . Patellofemoral pain syndrome of both knees 05/10/2018  . Generalized abdominal pain 04/17/2018  . Flank pain 04/17/2018  . Acute back pain 04/17/2018  . History of renal stone 04/17/2018  . Urinary frequency 04/17/2018  . Laceration of left hand 01/31/2018  . Decreased range of motion of finger of left hand 01/31/2018  . Localized swelling on left hand 01/31/2018  . Chronic migraine without aura without status migrainosus, not intractable 11/01/2016  . History of migraine 06/10/2016  . Diarrhea 05/20/2016  . Pain in the chest 05/11/2016  . Dizziness and giddiness 05/11/2016  . History of hypertension 05/11/2016  . Hemorrhoidal skin tag 04/07/2015  . IUD (intrauterine device) in place 05/03/2013  . Allergic rhinitis 01/11/2013  . Current smoker 01/11/2013  . Weight gain 06/26/2012  . Cervical stenosis (  uterine cervix) 04/25/2012  . Anticardiolipin antibody positive   . Hyperlipidemia   . Hypertension   . History of trichomoniasis 01/18/2011    Class: History of  . THYROID NODULE, RIGHT 02/05/2008  . GERD 02/05/2008  . NECK PAIN 02/05/2008    Carney Living 11/13/2019, 4:19 PM  [facility] [address] [place], [place], [address] Phone: [phone]   Fax:  [phone]  Name: VANNYA BICKNELL MRN: [id number] Date of Birth: 02-21-1966

## 2019-11-15 ENCOUNTER — Other Ambulatory Visit: Payer: Self-pay

## 2019-11-15 ENCOUNTER — Ambulatory Visit
Admission: RE | Admit: 2019-11-15 | Discharge: 2019-11-15 | Disposition: A | Discharge status: Home / Self Care | Payer: 59 | Source: Ambulatory Visit | Attending: Neurological Surgery | Admitting: Neurological Surgery

## 2019-11-15 DIAGNOSIS — M4322 Fusion of spine, cervical region: Secondary | ICD-10-CM | POA: Diagnosis not present

## 2019-11-15 DIAGNOSIS — M542 Cervicalgia: Secondary | ICD-10-CM

## 2019-11-15 MED FILL — OXYCODONE-ACETAMINOPHEN 5-3: 5-325 | 7 days supply | Qty: 30 | Fill #0

## 2019-11-18 ENCOUNTER — Ambulatory Visit: Payer: Self-pay | Admitting: General Surgery

## 2019-11-18 DIAGNOSIS — K603 Anal fistula: Secondary | ICD-10-CM | POA: Diagnosis not present

## 2019-11-18 NOTE — H&P (Signed)
The patient is a 54 year old female who presents with anal fistula. 54 year old female who presents to the office with complaints of a perianal abscess that started in January. She has had ongoing issues with this recurring over the past few months. She has been having some chronic diarrhea. She underwent a colonoscopy. She then underwent an MRI which showed a posterior intersphincteric abscess. She is here today to discuss surgical options. She complains of some external hemorrhoid symptoms as well.   Past Surgical History Sabino Gasser, CMA; 11/18/2019 1:45 PM) Cesarean Section - Multiple Colon Polyp Removal - Colonoscopy Oral Surgery Spinal Surgery - Neck  Diagnostic Studies History Sabino Gasser, CMA; 11/18/2019 1:45 PM) Colonoscopy within last year Mammogram within last year Pap Smear 1-5 years ago  Allergies Sabino Gasser, CMA; 11/18/2019 1:47 PM) Serotonin HCl *CHEMICALS* Zomig *MIGRAINE PRODUCTS* Avelox *FLUOROQUINOLONES* Morphine Sulfate (Concentrate) *ANALGESICS - OPIOID* Bactrim *ANTI-INFECTIVE AGENTS - MISC.* Allergies Reconciled  Medication History Sabino Gasser, CMA; 11/18/2019 1:48 PM) Topiramate (50MG  Tablet, Oral) Active. Metoprolol Tartrate (50MG  Tablet, Oral) Active. traMADol HCl (50MG  Tablet, Oral) Active. Cyclobenzaprine HCl (5MG  Tablet, Oral) Active. Lyrica (25MG  Capsule, Oral) Active. Medications Reconciled  Social History Sabino Gasser, CMA; 11/18/2019 1:45 PM) Alcohol use Occasional alcohol use. Caffeine use Carbonated beverages, Tea. No drug use Tobacco use Current every day smoker.  Family History Sabino Gasser, [place]; 11/18/2019 1:45 PM) Alcohol Abuse Brother. Cerebrovascular Accident Son. Colon Cancer Father. Depression Daughter, Sister, Son. Diabetes Mellitus Father, Sister. Heart Disease Father, Mother. Heart disease in female family member before age 28 Hypertension Father. Migraine Headache  Daughter. Rectal Cancer Father. Seizure disorder Son.  Pregnancy / Birth History Sabino Gasser, [place]; 11/18/2019 1:45 PM) Age at menarche 71 years. Contraceptive History Intrauterine device. Gravida 4 Irregular periods Length (months) of breastfeeding 12-24 Maternal age 86-25 Para 3  Other Problems Sabino Gasser, CMA; 11/18/2019 1:45 PM) Hemorrhoids High blood pressure Hypercholesterolemia Kidney Stone Migraine Headache Sleep Apnea     Review of Systems Sabino Gasser CMA; 11/18/2019 1:45 PM) General Not Present- Appetite Loss, Chills, Fatigue, Fever, Night Sweats, Weight Gain and Weight Loss. Skin Not Present- Change in Wart/Mole, Dryness, Hives, Jaundice, New Lesions, Non-Healing Wounds, Rash and Ulcer. HEENT Present- Seasonal Allergies. Not Present- Earache, Hearing Loss, Hoarseness, Nose Bleed, Oral Ulcers, Ringing in the Ears, Sinus Pain, Sore Throat, Visual Disturbances, Wears glasses/contact lenses and Yellow Eyes. Respiratory Not Present- Bloody sputum, Chronic Cough, Difficulty Breathing, Snoring and Wheezing. Breast Not Present- Breast Mass, Breast Pain, Nipple Discharge and Skin Changes. Cardiovascular Not Present- Chest Pain, Difficulty Breathing Lying Down, Leg Cramps, Palpitations, Rapid Heart Rate, Shortness of Breath and Swelling of Extremities. Gastrointestinal Present- Chronic diarrhea, Excessive gas, Hemorrhoids and Rectal Pain. Not Present- Abdominal Pain, Bloating, Bloody Stool, Change in Bowel Habits, Constipation, Difficulty Swallowing, Gets full quickly at meals, Indigestion, Nausea and Vomiting. Female Genitourinary Present- Frequency. Not Present- Nocturia, Painful Urination, Pelvic Pain and Urgency. Musculoskeletal Present- Muscle Pain. Not Present- Back Pain, Joint Pain, Joint Stiffness, Muscle Weakness and Swelling of Extremities. Neurological Present- Headaches, Numbness and Tingling. Not Present- Decreased Memory, Fainting, Seizures,  Tremor, Trouble walking and Weakness. Psychiatric Not Present- Anxiety, Bipolar, Change in Sleep Pattern, Depression, Fearful and Frequent crying. Endocrine Not Present- Cold Intolerance, Excessive Hunger, Hair Changes, Heat Intolerance, Hot flashes and New Diabetes. Hematology Not Present- Blood Thinners, Easy Bruising, Excessive bleeding, Gland problems, HIV and Persistent Infections.  Vitals Sabino Gasser CMA; 11/18/2019 1:49 PM) 11/18/2019 1:48 PM Weight: 171.6 lb Height: 64in Body Surface Area: 1.83 m  Body Mass Index: 29.45 kg/m  Temp.: 97.57F(Tympanic)  Pulse: 108 (Regular)  BP: 134/72 (Sitting, Left Arm, Standard)        Physical Exam Leighton Ruff MD; [id number] 2:09 PM)  General Mental Status-Alert. General Appearance-Cooperative.  Rectal Anorectal Exam External - Note: right posterior external opening, 2 small inflammatory tags anteriorly.    Assessment & Plan Leighton Ruff MD; [id number] 2:08 PM)  ANAL FISTULA (K60.3) Impression: 54 year old female with chronically loose stools who presents to the office with a perianal fistula. She has underwent an MRI which shows a right posterior intersphincteric fistula. Her exam seems to be consistent with this. I have recommended an exam under anesthesia with probable fistulotomy. She has 2 anterior inflammatory tags which she inquired about removing as well. We discussed that this can add some pain to her postoperative experience. We discussed that they can also recur quite readily. She will think about this and let me know. On the day of surgery. If she would like to proceed with excision with her surgery. We discussed the risk of recurrence with fistulotomy. We discussed the indications for placing a seton. We discussed the risk of incontinence with this type of fistulotomy is relatively low. We discussed that there are very few good surgical options besides fistulotomy for intersphincteric fistula.

## 2019-11-20 ENCOUNTER — Ambulatory Visit: Payer: 59 | Admitting: Physical Therapy

## 2019-11-20 ENCOUNTER — Other Ambulatory Visit: Payer: Self-pay

## 2019-11-20 ENCOUNTER — Encounter: Payer: Self-pay | Admitting: Physical Therapy

## 2019-11-20 DIAGNOSIS — M542 Cervicalgia: Secondary | ICD-10-CM | POA: Diagnosis not present

## 2019-11-20 DIAGNOSIS — M62838 Other muscle spasm: Secondary | ICD-10-CM

## 2019-11-20 DIAGNOSIS — M25512 Pain in left shoulder: Secondary | ICD-10-CM

## 2019-11-20 NOTE — Therapy (Signed)
[place], [place], [address] Phone: [phone]   Fax:  [phone]  Physical Therapy Treatment  Patient Details  Name: Kelly [name] MRN: [id number] Date of Birth: January 31, 1966 Referring Provider (PT): Dr Sherley Bounds    Encounter Date: 11/20/2019  PT End of Session - 11/20/19 1156    Visit Number  7    Number of Visits  12    Date for PT Re-Evaluation  12/25/19    Authorization Type  [facility]    PT Start Time  1150    PT Stop Time  1257    PT Time Calculation (min)  67 min    Activity Tolerance  Patient tolerated treatment well    Behavior During Therapy  [facility] for tasks assessed/performed       Past Medical History:  Diagnosis Date  . Anticardiolipin antibody positive   . HSV-2 (herpes simplex virus 2) infection   . Hyperlipidemia   . Hypertension    diet controlled  . Kidney stone   . Migraine   . Renal calculus, right   . Sleep apnea   . TMJ (dislocation of temporomandibular joint)     Past Surgical History:  Procedure Laterality Date  . CERVICAL SPINE SURGERY  07/2019   titanium plate/screws   . CESAREAN SECTION     X3   LAST ONE 11-30-2004  . CYSTOSCOPY W/ RETROGRADES Right 10/29/2012   Procedure: CYSTOSCOPY WITH RETROGRADE PYELOGRAM and stent placement;  Surgeon: Reece Packer, MD;  Location: [place];  Service: Urology;  Laterality: Right;  rt stent placement , rt retrograde and cysto   . CYSTOSCOPY W/ URETERAL STENT PLACEMENT Right 11/23/2012   Procedure: CYSTOSCOPY WITH STENT REPLACEMENT;  Surgeon: Alexis Frock, MD;  Location: [facility];  Service: Urology;  Laterality: Right;  . CYSTOSCOPY/RETROGRADE/URETEROSCOPY/STONE EXTRACTION WITH BASKET Right 11/23/2012   Procedure: CYSTOSCOPY/RETROGRADE/URETEROSCOPY/STONE EXTRACTION WITH BASKET;  Surgeon: Alexis Frock, MD;  Location: [facility];  Service: Urology;  Laterality: Right;     There were no vitals filed for this visit.  Subjective Assessment - 11/20/19 1154    Subjective  Patients pain is about the same. Her pain level is about a 3/10 today. She continues to have numbness when she uses her lap top.    Pertinent History  TMJ, migranes,    Limitations  Sitting    How long can you stand comfortably?  standing is OK    How long can you walk comfortably?  no real diffulty    Diagnostic tests  X-rays showed good fusion    Currently in Pain?  Yes    Pain Score  3     Pain Location  Neck    Pain Orientation  Right    Pain Descriptors / Indicators  Aching    Pain Type  Chronic pain    Pain Onset  Today    Pain Frequency  Constant    Pain Relieving Factors  rest    Effect of Pain on Daily Activities  unuable to work    Multiple Pain Sites  No         [facility] PT Assessment - 11/20/19 0001      ROM / Strength   AROM / PROM / Strength  AROM;PROM;Strength      Flexibility   Soft Tissue Assessment /Muscle Length  yes  [facility] PT Treatment/Exercise - 11/20/19 0001      Neck Exercises: Standing   Other Standing Exercises  scap retraction x20 red; shoulder extension x20 red      Neck Exercises: Supine   Other Supine Exercise  bilateral ER and horizontal abduction red 2x10 each with min cuing.  wand flexion 2x10 mild numbness in the right hand.       Lumbar Exercises: Standing   Other Standing Lumbar Exercises  scap retraction cx20 shoulder extension x20 red     Other Standing Lumbar Exercises  standing wall wash cw, ccw, flexion 2x10 bilateral       Lumbar Exercises: Seated   Other Seated Lumbar Exercises  pulleys 2 min     Other Seated Lumbar Exercises  bilateral er red 2x10; horizontal abduction x10 red       Electrical Stimulation   Electrical Stimulation Location  cervical spine     Electrical Stimulation Goals  Pain      Manual Therapy   Manual Therapy  Passive ROM    Manual therapy comments  1st rib  mobilization; no change in symptoms     Soft tissue mobilization  trigger point release to cervical parapinals, upper trap , IASTYM to upper trap and shoulder.     Manual Traction  sub-occipital release                PT Short Term Goals - 10/24/19 1212      PT SHORT TERM GOAL #1   Title  Patient will be indepdent with relaxation and breathing techniques to reduce actue inflamtion.    Baseline  independent    Time  3    Period  Weeks    Status  On-going    Target Date  11/08/19      PT SHORT TERM GOAL #2   Title  Patient will report no radiating pain down both of her amrs    Baseline  intermitent depending on activity    Time  3    Period  Weeks    Status  On-going    Target Date  10/21/19      PT SHORT TERM GOAL #3   Title  Patient will increase gross bilateral UE strength to 4/5    Baseline  3/10 today    Period  Weeks    Status  On-going    Target Date  10/21/19        PT Long Term Goals - 11/13/19 1615      PT LONG TERM GOAL #1   Title  Patient will demonstrate 60 degrees of passive rotation ROM bilateral without pain    Baseline  improved but not 60    Time  6    Period  Weeks    Status  On-going      PT LONG TERM GOAL #2   Title  Patient will sit at her desk for 4 hours without pain    Baseline  incfrease pain as she sits    Time  6    Period  Weeks    Status  On-going      PT LONG TERM GOAL #3   Title  Patient will reach overhead to acabinet without pain    Baseline  patients hand goes numb    Time  6    Period  Weeks    Status  On-going      PT LONG TERM GOAL #4   Title  increase FOTO score  to </= 35% limited to demo improvement in function     Baseline  not taken    Time  6    Period  Weeks    Status  On-going    Target Date  12/25/19      PT LONG TERM GOAL #5   Title  pt to be I with all HEP given as of last visit to maintain and progress current level of function     Baseline  working on her exercises    Time  6    Period  Weeks     Status  On-going            Plan - 11/20/19 1328    Clinical Impression Statement  Therapy added wall washing for endurance. She had no increase in pain or numbness of the arm. Her pinkie was numb to begin with. She has been tolerating more exercises. She continues to have spamsing.    Personal Factors and Comorbidities  Comorbidity 1;Comorbidity 2;Profession    Comorbidities  history of cervical spine pain, TMJ    Examination-Activity Limitations  Lift;Reach Overhead;Locomotion Level    Stability/Clinical Decision Making  Evolving/Moderate complexity    Clinical Decision Making  Moderate    Rehab Potential  Good    PT Frequency  2x / week    PT Duration  6 weeks    PT Treatment/Interventions  ADLs/Self Care Home Management;Cryotherapy;Electrical Stimulation;Iontophoresis 4mg /ml Dexamethasone;Moist Heat;Ultrasound;Traction;DME Instruction;Gait training;Stair training;Functional mobility training;Therapeutic activities;Therapeutic exercise;Neuromuscular re-education;Patient/family education;Manual techniques;Passive range of motion;Dry needling;Taping    PT Next Visit Plan  begin manual therapy on upper traps and cervical muscles. Continue to progress activity as tolerated    PT Home Exercise Plan  scap retraction    Consulted and Agree with Plan of Care  Patient       Patient will benefit from skilled therapeutic intervention in order to improve the following deficits and impairments:  Pain, Increased muscle spasms, Decreased activity tolerance, Decreased strength, Decreased range of motion, Decreased endurance, Increased fascial restricitons  Visit Diagnosis: Cervicalgia  Other muscle spasm  Acute pain of left shoulder     Problem List Patient Active Problem List   Diagnosis Date Noted  . Stressful life event affecting family 07/12/2019  . Caregiver burden 07/12/2019  . Tobacco abuse counseling 07/12/2019  . Protrusion of cervical intervertebral disc 04/10/2019  .  Spinal stenosis of cervical region 04/10/2019  . Overactive bladder 11/19/2018  . Photosensitization due to sun 11/02/2018  . Generalized pain 11/02/2018  . High risk medication use 11/02/2018  . Foot pain, bilateral 05/10/2018  . Heel pain, bilateral 05/10/2018  . Plantar fasciitis 05/10/2018  . Left knee pain 05/10/2018  . Patellofemoral pain syndrome of both knees 05/10/2018  . Generalized abdominal pain 04/17/2018  . Flank pain 04/17/2018  . Acute back pain 04/17/2018  . History of renal stone 04/17/2018  . Urinary frequency 04/17/2018  . Laceration of left hand 01/31/2018  . Decreased range of motion of finger of left hand 01/31/2018  . Localized swelling on left hand 01/31/2018  . Chronic migraine without aura without status migrainosus, not intractable 11/01/2016  . History of migraine 06/10/2016  . Diarrhea 05/20/2016  . Pain in the chest 05/11/2016  . Dizziness and giddiness 05/11/2016  . History of hypertension 05/11/2016  . Hemorrhoidal skin tag 04/07/2015  . IUD (intrauterine device) in place 05/03/2013  . Allergic rhinitis 01/11/2013  . Current smoker 01/11/2013  . Weight gain 06/26/2012  . Cervical stenosis (uterine  cervix) 04/25/2012  . Anticardiolipin antibody positive   . Hyperlipidemia   . Hypertension   . History of trichomoniasis 01/18/2011    Class: History of  . THYROID NODULE, RIGHT 02/05/2008  . GERD 02/05/2008  . NECK PAIN 02/05/2008    Carney Living PT DPT  11/20/2019, 2:00 PM  [facility] [address] [place], [place], [address] Phone: [phone]   Fax:  [phone]  Name: MADELEYN SCHNURBUSCH MRN: [id number] Date of Birth: Dec 07, 1965

## 2019-11-23 ENCOUNTER — Other Ambulatory Visit (HOSPITAL_COMMUNITY)
Admission: RE | Admit: 2019-11-23 | Discharge: 2019-11-23 | Disposition: A | Discharge status: Home / Self Care | Payer: 59 | Source: Ambulatory Visit | Attending: General Surgery | Admitting: General Surgery

## 2019-11-23 ENCOUNTER — Other Ambulatory Visit (HOSPITAL_COMMUNITY): Payer: 59

## 2019-11-23 DIAGNOSIS — Z01812 Encounter for preprocedural laboratory examination: Secondary | ICD-10-CM | POA: Insufficient documentation

## 2019-11-23 DIAGNOSIS — Z20822 Contact with and (suspected) exposure to covid-19: Secondary | ICD-10-CM | POA: Diagnosis not present

## 2019-11-23 LAB — SARS CORONAVIRUS 2 (TAT 6-24 HRS): SARS Coronavirus 2: NEGATIVE

## 2019-11-25 ENCOUNTER — Encounter (HOSPITAL_BASED_OUTPATIENT_CLINIC_OR_DEPARTMENT_OTHER): Payer: Self-pay | Admitting: General Surgery

## 2019-11-25 ENCOUNTER — Other Ambulatory Visit: Payer: Self-pay

## 2019-11-25 MED FILL — LIDOCAINE 5 % CRM: 5 | 15 days supply | Qty: 30 | Fill #2

## 2019-11-25 MED FILL — OXYCODONE-ACETAMINOPHEN 5-3: 5-325 | 7 days supply | Qty: 30 | Fill #0

## 2019-11-25 NOTE — Progress Notes (Signed)
Spoke w/ via phone for pre-op interview--- PT Lab needs dos---  Istat 8 , EKG, urine preg       Lab results------ covid test results negative dated 11-23-2019 in epic COVID test ------ done 11-23-2019 Arrive at ------- 0945 NPO after ------ [place] Medications to take morning of surgery ----- Lopressor, Lyrica, Topamax and if needed may take either oxycodone or tramadol but not both Diabetic medication ----- n/a Patient Special Instructions ----- asked to bring cpap/ mask/ tubing with her dos Pre-Op special Istructions ----- n/a Patient verbalized understanding of instructions that were given at this phone interview. Patient denies shortness of breath, chest pain, fever, cough a this phone interview.   Anesthesia :  Review not needed prior to surgery  PCP:  Chana Bode PA Cardiologist : no Chest x-ray : EKG : 05-11-2016 epic Echo : no Stress test:  Per stated had a stress test done by dr Wynonia Lawman appox. 2018, told normal (not available in epic) Cardiac Cath :  no Sleep Study/ CPAP :   YES/ YES Fasting Blood Sugar :      / Checks Blood Sugar -- times a day:   N/A Blood Thinner/ Instructions /Last Dose: NO ASA / Instructions/ Last Dose :  NO

## 2019-11-27 ENCOUNTER — Ambulatory Visit (HOSPITAL_BASED_OUTPATIENT_CLINIC_OR_DEPARTMENT_OTHER): Admission: RE | Admit: 2019-11-27 | Payer: 59 | Source: Home / Self Care | Admitting: General Surgery

## 2019-11-27 HISTORY — DX: Anal fistula, unspecified: K60.30

## 2019-11-27 HISTORY — DX: Personal history of urinary calculi: Z87.442

## 2019-11-27 HISTORY — DX: Presence of spectacles and contact lenses: Z97.3

## 2019-11-27 HISTORY — DX: Obstructive sleep apnea (adult) (pediatric): G47.33

## 2019-11-27 HISTORY — DX: Migraine, unspecified, not intractable, without status migrainosus: G43.909

## 2019-11-27 HISTORY — DX: Personal history of other infectious and parasitic diseases: Z86.19

## 2019-11-27 HISTORY — DX: Cervicalgia: M54.2

## 2019-11-27 HISTORY — DX: Anal fistula: K60.3

## 2019-11-27 SURGERY — EXAM UNDER ANESTHESIA WITH ANAL FISSUROTOMY
Anesthesia: Monitor Anesthesia Care

## 2019-11-28 ENCOUNTER — Encounter: Payer: Self-pay | Admitting: Physical Therapy

## 2019-11-28 ENCOUNTER — Other Ambulatory Visit: Payer: Self-pay

## 2019-11-28 ENCOUNTER — Ambulatory Visit: Payer: 59 | Attending: Neurological Surgery | Admitting: Physical Therapy

## 2019-11-28 DIAGNOSIS — M542 Cervicalgia: Secondary | ICD-10-CM | POA: Diagnosis not present

## 2019-11-28 DIAGNOSIS — M25562 Pain in left knee: Secondary | ICD-10-CM | POA: Insufficient documentation

## 2019-11-28 DIAGNOSIS — M25612 Stiffness of left shoulder, not elsewhere classified: Secondary | ICD-10-CM | POA: Diagnosis not present

## 2019-11-28 DIAGNOSIS — M62838 Other muscle spasm: Secondary | ICD-10-CM | POA: Diagnosis not present

## 2019-11-28 DIAGNOSIS — M25512 Pain in left shoulder: Secondary | ICD-10-CM | POA: Insufficient documentation

## 2019-11-28 DIAGNOSIS — G8929 Other chronic pain: Secondary | ICD-10-CM | POA: Diagnosis not present

## 2019-11-28 NOTE — Therapy (Signed)
[place], [place], [address] Phone: [phone]   Fax:  [phone]  Physical Therapy Treatment  Patient Details  Name: Kelly [name] MRN: [id number] Date of Birth: [birth year] Referring Provider (PT): Dr Sherley Bounds    Encounter Date: 11/28/2019  PT End of Session - 11/28/19 1539    Visit Number  8    Number of Visits  12    Date for PT Re-Evaluation  12/25/19    Authorization Type  [facility]    PT Start Time  [id number]    PT Stop Time  1110    PT Time Calculation (min)  55 min    Activity Tolerance  Patient tolerated treatment well    Behavior During Therapy  [facility] for tasks assessed/performed       Past Medical History:  Diagnosis Date  . Anal fistula    intersphincteric   . Anticardiolipin antibody positive   . Cervicalgia    followed by dr Sherley Bounds  . History of herpes genitalis    many yrs ago  . History of kidney stones   . Hyperlipidemia   . Hypertension    followed by pcp   (11-25-2019  per pt had stress test approx. 2018 w/ Dr Wynonia Lawman, told normal)  . Migraines    neurologist--- dr Jaynee Eagles  . OSA on CPAP   . TMJ (dislocation of temporomandibular joint)    11-25-2019  jaw pops, no appliance  . Wears glasses     Past Surgical History:  Procedure Laterality Date  . ANTERIOR CERVICAL DECOMP/DISCECTOMY FUSION  12/ 2020   @[facility]   C4 - 6  . CESAREAN SECTION     X3   LAST ONE 11-30-2004  . CYSTOSCOPY W/ RETROGRADES Right 10/29/2012   Procedure: CYSTOSCOPY WITH RETROGRADE PYELOGRAM and stent placement;  Surgeon: Reece Packer, MD;  Location: [facility];  Service: Urology;  Laterality: Right;  rt stent placement , rt retrograde and cysto   . CYSTOSCOPY W/ URETERAL STENT PLACEMENT Right 11/23/2012   Procedure: CYSTOSCOPY WITH STENT REPLACEMENT;  Surgeon: Alexis Frock, MD;  Location: [facility];  Service: Urology;  Laterality: Right;  .  CYSTOSCOPY/RETROGRADE/URETEROSCOPY/STONE EXTRACTION WITH BASKET Right 11/23/2012   Procedure: CYSTOSCOPY/RETROGRADE/URETEROSCOPY/STONE EXTRACTION WITH BASKET;  Surgeon: Alexis Frock, MD;  Location: [facility];  Service: Urology;  Laterality: Right;    There were no vitals filed for this visit.  Subjective Assessment - 11/28/19 1537    Subjective  No significant change. Had minor soreness after the last visit. Contiues tpo have numbenss in her finger when she uses the computer.    Pertinent History  TMJ, migranes,    Limitations  Sitting    How long can you stand comfortably?  standing is OK    How long can you walk comfortably?  no real diffulty    Diagnostic tests  X-rays showed good fusion    Patient Stated Goals  Less pain/ To be able to go back to work    Currently in Pain?  Yes    Pain Score  4     Pain Location  Neck    Pain Orientation  Right    Pain Descriptors / Indicators  Aching    Pain Type  Chronic pain    Pain Radiating Towards  numbness into her pinkie    Pain Onset  Today    Pain Frequency  Constant    Pain  Relieving Factors  rest                       [facility] Adult PT Treatment/Exercise - 11/28/19 0001      Neck Exercises: Machines for Strengthening   Other Machines for Strengthening  shoulder pulley x2 min       Neck Exercises: Standing   Other Standing Exercises  scap retraction x20 green; shoulder extension x20 green     Other Standing Exercises  wall wash up/down x10 cw/ccw x10 each       Neck Exercises: Seated   Other Seated Exercise  setaed bilateral er red x20; horizontal abduction x15       Neck Exercises: Supine   Other Supine Exercise  bilateral ER and horizontal abduction red 2x10 each with min cuing.  wand flexion 2x10 mild numbness in the right hand.       Lumbar Exercises: Standing   Other Standing Lumbar Exercises  scap retraction cx20 shoulder extension x20 red     Other Standing Lumbar Exercises  standing wall  wash cw, ccw, flexion 2x10 bilateral       Lumbar Exercises: Seated   Other Seated Lumbar Exercises  pulleys 2 min     Other Seated Lumbar Exercises  bilateral er red 2x10; horizontal abduction x10 red       Electrical Stimulation   Electrical Stimulation Location  cervical spine     Electrical Stimulation Action  IFC     Electrical Stimulation Parameters  to tolerance     Electrical Stimulation Goals  Pain      Manual Therapy   Manual Therapy  Passive ROM    Manual therapy comments  --    Soft tissue mobilization  trigger point release to cervical parapinals, upper trap , IASTYM to upper trap and shoulder.     Manual Traction  sub-occipital release              PT Education - 11/28/19 1539    Education Details  viewed exercise progression    Person(s) Educated  Patient    Methods  Explanation;Demonstration;Verbal cues;Tactile cues    Comprehension  Verbal cues required;Returned demonstration;Verbalized understanding       PT Short Term Goals - 10/24/19 1212      PT SHORT TERM GOAL #1   Title  Patient will be indepdent with relaxation and breathing techniques to reduce actue inflamtion.    Baseline  independent    Time  3    Period  Weeks    Status  On-going    Target Date  11/08/19      PT SHORT TERM GOAL #2   Title  Patient will report no radiating pain down both of her amrs    Baseline  intermitent depending on activity    Time  3    Period  Weeks    Status  On-going    Target Date  10/21/19      PT SHORT TERM GOAL #3   Title  Patient will increase gross bilateral UE strength to 4/5    Baseline  3/10 today    Period  Weeks    Status  On-going    Target Date  10/21/19        PT Long Term Goals - 11/28/19 1542      PT LONG TERM GOAL #1   Title  Patient will demonstrate 60 degrees of passive rotation ROM bilateral without pain    Baseline  improved but not 60    Time  6    Period  Weeks    Status  On-going      PT LONG TERM GOAL #2   Title   Patient will sit at her desk for 4 hours without pain    Baseline  incfrease pain as she sits    Time  6    Period  Weeks    Status  On-going      PT LONG TERM GOAL #3   Title  Patient will reach overhead to acabinet without pain    Baseline  patients hand goes numb    Time  6    Period  Weeks    Status  On-going      PT LONG TERM GOAL #4   Title  increase FOTO score to </= 35% limited to demo improvement in function     Baseline  not taken    Time  6    Period  Weeks    Status  On-going      PT LONG TERM GOAL #5   Title  pt to be I with all HEP given as of last visit to maintain and progress current level of function     Baseline  working on her exercises    Time  6    Period  Weeks    Status  On-going            Plan - 11/28/19 1540    Clinical Impression Statement  Therapy continues to advance patients exercises. She has some pain and numbness but tolerable. She felt better after manual therapy and heat and  STIM    Personal Factors and Comorbidities  Comorbidity 1;Comorbidity 2;Profession    Comorbidities  history of cervical spine pain, TMJ    Examination-Activity Limitations  Lift;Reach Overhead;Locomotion Level    Examination-Participation Restrictions  Church;Cleaning;Community Activity;Laundry;Meal Prep    Stability/Clinical Decision Making  Evolving/Moderate complexity    Clinical Decision Making  Moderate    Rehab Potential  Good    PT Frequency  2x / week    PT Duration  6 weeks    PT Treatment/Interventions  ADLs/Self Care Home Management;Cryotherapy;Electrical Stimulation;Iontophoresis 4mg /ml Dexamethasone;Moist Heat;Ultrasound;Traction;DME Instruction;Gait training;Stair training;Functional mobility training;Therapeutic activities;Therapeutic exercise;Neuromuscular re-education;Patient/family education;Manual techniques;Passive range of motion;Dry needling;Taping    PT Next Visit Plan  begin manual therapy on upper traps and cervical muscles. Continue to  progress activity as tolerated    PT Home Exercise Plan  scap retraction    Consulted and Agree with Plan of Care  Patient       Patient will benefit from skilled therapeutic intervention in order to improve the following deficits and impairments:  Pain, Increased muscle spasms, Decreased activity tolerance, Decreased strength, Decreased range of motion, Decreased endurance, Increased fascial restricitons  Visit Diagnosis: Cervicalgia  Other muscle spasm  Acute pain of left shoulder  Stiffness of left shoulder, not elsewhere classified     Problem List Patient Active Problem List   Diagnosis Date Noted  . Stressful life event affecting family 07/12/2019  . Caregiver burden 07/12/2019  . Tobacco abuse counseling 07/12/2019  . Protrusion of cervical intervertebral disc 04/10/2019  . Spinal stenosis of cervical region 04/10/2019  . Overactive bladder 11/19/2018  . Photosensitization due to sun 11/02/2018  . Generalized pain 11/02/2018  . High risk medication use 11/02/2018  . Foot pain, bilateral 05/10/2018  . Heel pain, bilateral 05/10/2018  . Plantar fasciitis 05/10/2018  . Left knee pain  05/10/2018  . Patellofemoral pain syndrome of both knees 05/10/2018  . Generalized abdominal pain 04/17/2018  . Flank pain 04/17/2018  . Acute back pain 04/17/2018  . History of renal stone 04/17/2018  . Urinary frequency 04/17/2018  . Laceration of left hand 01/31/2018  . Decreased range of motion of finger of left hand 01/31/2018  . Localized swelling on left hand 01/31/2018  . Chronic migraine without aura without status migrainosus, not intractable 11/01/2016  . History of migraine 06/10/2016  . Diarrhea 05/20/2016  . Pain in the chest 05/11/2016  . Dizziness and giddiness 05/11/2016  . History of hypertension 05/11/2016  . Hemorrhoidal skin tag 04/07/2015  . IUD (intrauterine device) in place 05/03/2013  . Allergic rhinitis 01/11/2013  . Current smoker 01/11/2013  . Weight  gain 06/26/2012  . Cervical stenosis (uterine cervix) 04/25/2012  . Anticardiolipin antibody positive   . Hyperlipidemia   . Hypertension   . History of trichomoniasis 01/18/2011    Class: History of  . THYROID NODULE, RIGHT 02/05/2008  . GERD 02/05/2008  . NECK PAIN 02/05/2008    Carney Living PT DPT  11/28/2019, 3:44 PM  [facility] [address] [place], [place], [address] Phone: [phone]   Fax:  [phone]  Name: Kelly [name] MRN: [id number] Date of Birth: February 11, 1966

## 2019-11-29 DIAGNOSIS — R41840 Attention and concentration deficit: Secondary | ICD-10-CM | POA: Diagnosis not present

## 2019-11-29 DIAGNOSIS — F4312 Post-traumatic stress disorder, chronic: Secondary | ICD-10-CM | POA: Diagnosis not present

## 2019-12-03 ENCOUNTER — Other Ambulatory Visit: Payer: Self-pay

## 2019-12-03 ENCOUNTER — Encounter: Payer: Self-pay | Admitting: Physical Therapy

## 2019-12-03 ENCOUNTER — Ambulatory Visit: Payer: 59 | Admitting: Physical Therapy

## 2019-12-03 DIAGNOSIS — M25512 Pain in left shoulder: Secondary | ICD-10-CM | POA: Diagnosis not present

## 2019-12-03 DIAGNOSIS — M542 Cervicalgia: Secondary | ICD-10-CM | POA: Diagnosis not present

## 2019-12-03 DIAGNOSIS — M25612 Stiffness of left shoulder, not elsewhere classified: Secondary | ICD-10-CM | POA: Diagnosis not present

## 2019-12-03 DIAGNOSIS — M62838 Other muscle spasm: Secondary | ICD-10-CM

## 2019-12-03 DIAGNOSIS — M25562 Pain in left knee: Secondary | ICD-10-CM | POA: Diagnosis not present

## 2019-12-03 DIAGNOSIS — G8929 Other chronic pain: Secondary | ICD-10-CM | POA: Diagnosis not present

## 2019-12-04 ENCOUNTER — Encounter: Payer: Self-pay | Admitting: Physical Therapy

## 2019-12-04 NOTE — Therapy (Signed)
[place], [place], [address] Phone: [phone]   Fax:  [phone]  Physical Therapy Treatment  Patient Details  Name: Kelly [name] MRN: [id number] Date of Birth: Dec 25, 1965 Referring Provider (PT): Dr Sherley Bounds    Encounter Date: 12/03/2019  PT End of Session - 12/03/19 1607    Visit Number  9    Number of Visits  12    Date for PT Re-Evaluation  12/25/19    Authorization Type  [facility]    PT Start Time  [id number]    PT Stop Time  1641    PT Time Calculation (min)  56 min    Activity Tolerance  Patient tolerated treatment well    Behavior During Therapy  [facility] for tasks assessed/performed       Past Medical History:  Diagnosis Date  . Anal fistula    intersphincteric   . Anticardiolipin antibody positive   . Cervicalgia    followed by dr Sherley Bounds  . History of herpes genitalis    many yrs ago  . History of kidney stones   . Hyperlipidemia   . Hypertension    followed by pcp   (11-25-2019  per pt had stress test approx. 2018 w/ Dr Wynonia Lawman, told normal)  . Migraines    neurologist--- dr Jaynee Eagles  . OSA on CPAP   . TMJ (dislocation of temporomandibular joint)    11-25-2019  jaw pops, no appliance  . Wears glasses     Past Surgical History:  Procedure Laterality Date  . ANTERIOR CERVICAL DECOMP/DISCECTOMY FUSION  12/ 2020   @[facility]   C4 - 6  . CESAREAN SECTION     X3   LAST ONE 11-30-2004  . CYSTOSCOPY W/ RETROGRADES Right 10/29/2012   Procedure: CYSTOSCOPY WITH RETROGRADE PYELOGRAM and stent placement;  Surgeon: Reece Packer, MD;  Location: [place];  Service: Urology;  Laterality: Right;  rt stent placement , rt retrograde and cysto   . CYSTOSCOPY W/ URETERAL STENT PLACEMENT Right 11/23/2012   Procedure: CYSTOSCOPY WITH STENT REPLACEMENT;  Surgeon: Alexis Frock, MD;  Location: [facility];  Service: Urology;  Laterality: Right;  .  CYSTOSCOPY/RETROGRADE/URETEROSCOPY/STONE EXTRACTION WITH BASKET Right 11/23/2012   Procedure: CYSTOSCOPY/RETROGRADE/URETEROSCOPY/STONE EXTRACTION WITH BASKET;  Surgeon: Alexis Frock, MD;  Location: [facility];  Service: Urology;  Laterality: Right;    There were no vitals filed for this visit.  Subjective Assessment - 12/03/19 1551    Subjective  Patient cleaned out her jumk room yesterday and lifted boxes. She had trouble sleeping last night and is in pain today.    Pertinent History  TMJ, migranes,    Limitations  Sitting    How long can you stand comfortably?  standing is OK    How long can you walk comfortably?  no real diffulty    Diagnostic tests  X-rays showed good fusion    Patient Stated Goals  Less pain/ To be able to go back to work    Currently in Pain?  Yes    Pain Score  5     Pain Location  Neck    Pain Orientation  Right;Left    Pain Descriptors / Indicators  Aching    Pain Type  Chronic pain    Pain Radiating Towards  numbness into the pinkie    Pain Onset  Today    Pain Frequency  Constant    Aggravating Factors  use of her arms    Pain Relieving Factors  rest    Effect of Pain on Daily Activities  unable to work                       [facility] Adult PT Treatment/Exercise - 12/04/19 0001      Neck Exercises: Machines for Strengthening   UBE (Upper Arm Bike)  2 mins posterior     Other Machines for Strengthening  shoulder pulley x2 min       Neck Exercises: Standing   Other Standing Exercises  scap retraction x20 green; shoulder extension x20 green     Other Standing Exercises  wall wash up/down x10 cw/ccw x10 each       Neck Exercises: Supine   Other Supine Exercise  supine wand flexion 2x10 1lb       Lumbar Exercises: Standing   Other Standing Lumbar Exercises  scap retraction cx20 green shoulder extension x20 green       Lumbar Exercises: Seated   Other Seated Lumbar Exercises  seated shoulder flexion 2x10 1lb       Moist  Heat Therapy   Number Minutes Moist Heat  10 Minutes    Moist Heat Location  Cervical      Electrical Stimulation   Electrical Stimulation Location  cervical spine     Electrical Stimulation Action  IFC     Electrical Stimulation Parameters  to tolerance     Electrical Stimulation Goals  Pain      Manual Therapy   Manual Therapy  Passive ROM    Soft tissue mobilization  trigger point release to cervical parapinals, upper trap , IASTYM to upper trap and shoulder.     Manual Traction  sub-occipital release       Neck Exercises: Stretches   Upper Trapezius Stretch  3 reps;20 seconds    Levator Stretch  3 reps;20 seconds             PT Education - 12/03/19 1607    Education Details  HEP and symptom mangement    Person(s) Educated  Patient    Methods  Tactile cues;Demonstration;Explanation;Verbal cues    Comprehension  Returned demonstration;Verbalized understanding;Verbal cues required;Tactile cues required       PT Short Term Goals - 10/24/19 1212      PT SHORT TERM GOAL #1   Title  Patient will be indepdent with relaxation and breathing techniques to reduce actue inflamtion.    Baseline  independent    Time  3    Period  Weeks    Status  On-going    Target Date  11/08/19      PT SHORT TERM GOAL #2   Title  Patient will report no radiating pain down both of her amrs    Baseline  intermitent depending on activity    Time  3    Period  Weeks    Status  On-going    Target Date  10/21/19      PT SHORT TERM GOAL #3   Title  Patient will increase gross bilateral UE strength to 4/5    Baseline  3/10 today    Period  Weeks    Status  On-going    Target Date  10/21/19        PT Long Term Goals - 11/28/19 1542      PT LONG TERM GOAL #1   Title  Patient will demonstrate 60 degrees of passive  rotation ROM bilateral without pain    Baseline  improved but not 60    Time  6    Period  Weeks    Status  On-going      PT LONG TERM GOAL #2   Title  Patient will sit  at her desk for 4 hours without pain    Baseline  incfrease pain as she sits    Time  6    Period  Weeks    Status  On-going      PT LONG TERM GOAL #3   Title  Patient will reach overhead to acabinet without pain    Baseline  patients hand goes numb    Time  6    Period  Weeks    Status  On-going      PT LONG TERM GOAL #4   Title  increase FOTO score to </= 35% limited to demo improvement in function     Baseline  not taken    Time  6    Period  Weeks    Status  On-going      PT LONG TERM GOAL #5   Title  pt to be I with all HEP given as of last visit to maintain and progress current level of function     Baseline  working on her exercises    Time  6    Period  Weeks    Status  On-going            Plan - 12/04/19 1436    Clinical Impression Statement  Therapy added seated wanfd flewxion qith 1 lb weight and ube 2 min posterior. She had no significant increase in pain or numbness. She continues to tolerated treatment wqell. She appears to have slightly less spasming in her right upper trap. Therapy will continue to progress ther-ex slowly.    Personal Factors and Comorbidities  Comorbidity 1;Comorbidity 2;Profession    Comorbidities  history of cervical spine pain, TMJ    Examination-Activity Limitations  Lift;Reach Overhead;Locomotion Level    Examination-Participation Restrictions  Church;Cleaning;Community Activity;Laundry;Meal Prep    Stability/Clinical Decision Making  Evolving/Moderate complexity    Clinical Decision Making  Moderate    Rehab Potential  Good    PT Frequency  2x / week    PT Duration  6 weeks    PT Treatment/Interventions  ADLs/Self Care Home Management;Cryotherapy;Electrical Stimulation;Iontophoresis 4mg /ml Dexamethasone;Moist Heat;Ultrasound;Traction;DME Instruction;Gait training;Stair training;Functional mobility training;Therapeutic activities;Therapeutic exercise;Neuromuscular re-education;Patient/family education;Manual techniques;Passive range  of motion;Dry needling;Taping    PT Next Visit Plan  begin manual therapy on upper traps and cervical muscles. Continue to progress activity as tolerated    PT Home Exercise Plan  scap retraction    Consulted and Agree with Plan of Care  Patient       Patient will benefit from skilled therapeutic intervention in order to improve the following deficits and impairments:  Pain, Increased muscle spasms, Decreased activity tolerance, Decreased strength, Decreased range of motion, Decreased endurance, Increased fascial restricitons  Visit Diagnosis: Cervicalgia  Other muscle spasm  Acute pain of left shoulder     Problem List Patient Active Problem List   Diagnosis Date Noted  . Stressful life event affecting family 07/12/2019  . Caregiver burden 07/12/2019  . Tobacco abuse counseling 07/12/2019  . Protrusion of cervical intervertebral disc 04/10/2019  . Spinal stenosis of cervical region 04/10/2019  . Overactive bladder 11/19/2018  . Photosensitization due to sun 11/02/2018  . Generalized pain 11/02/2018  . High risk  medication use 11/02/2018  . Foot pain, bilateral 05/10/2018  . Heel pain, bilateral 05/10/2018  . Plantar fasciitis 05/10/2018  . Left knee pain 05/10/2018  . Patellofemoral pain syndrome of both knees 05/10/2018  . Generalized abdominal pain 04/17/2018  . Flank pain 04/17/2018  . Acute back pain 04/17/2018  . History of renal stone 04/17/2018  . Urinary frequency 04/17/2018  . Laceration of left hand 01/31/2018  . Decreased range of motion of finger of left hand 01/31/2018  . Localized swelling on left hand 01/31/2018  . Chronic migraine without aura without status migrainosus, not intractable 11/01/2016  . History of migraine 06/10/2016  . Diarrhea 05/20/2016  . Pain in the chest 05/11/2016  . Dizziness and giddiness 05/11/2016  . History of hypertension 05/11/2016  . Hemorrhoidal skin tag 04/07/2015  . IUD (intrauterine device) in place 05/03/2013  .  Allergic rhinitis 01/11/2013  . Current smoker 01/11/2013  . Weight gain 06/26/2012  . Cervical stenosis (uterine cervix) 04/25/2012  . Anticardiolipin antibody positive   . Hyperlipidemia   . Hypertension   . History of trichomoniasis 01/18/2011    Class: History of  . THYROID NODULE, RIGHT 02/05/2008  . GERD 02/05/2008  . NECK PAIN 02/05/2008    Carney Living PT DPT  12/04/2019, 2:43 PM  [facility] [address] [place], [place], [address] Phone: [phone]   Fax:  [phone]  Name: Kelly [name] MRN: [id number] Date of Birth: 03/09/1966

## 2019-12-05 MED FILL — OXYCODONE-ACETAMINOPHEN 5-3: 5-325 | 7 days supply | Qty: 30 | Fill #0

## 2019-12-05 MED FILL — PREGABALIN 50 MG CAPS: 50 | 30 days supply | Qty: 60 | Fill #1

## 2019-12-05 MED FILL — CYCLOBENZAPRINE HCL 5 MG TA: 5 | 20 days supply | Qty: 60 | Fill #0

## 2019-12-10 ENCOUNTER — Other Ambulatory Visit: Payer: Self-pay

## 2019-12-10 ENCOUNTER — Ambulatory Visit: Payer: 59 | Admitting: Physical Therapy

## 2019-12-10 ENCOUNTER — Encounter: Payer: Self-pay | Admitting: Physical Therapy

## 2019-12-10 DIAGNOSIS — M25512 Pain in left shoulder: Secondary | ICD-10-CM

## 2019-12-10 DIAGNOSIS — M25612 Stiffness of left shoulder, not elsewhere classified: Secondary | ICD-10-CM

## 2019-12-10 DIAGNOSIS — M25562 Pain in left knee: Secondary | ICD-10-CM

## 2019-12-10 DIAGNOSIS — M62838 Other muscle spasm: Secondary | ICD-10-CM | POA: Diagnosis not present

## 2019-12-10 DIAGNOSIS — G8929 Other chronic pain: Secondary | ICD-10-CM

## 2019-12-10 DIAGNOSIS — G959 Disease of spinal cord, unspecified: Secondary | ICD-10-CM | POA: Diagnosis not present

## 2019-12-10 DIAGNOSIS — M542 Cervicalgia: Secondary | ICD-10-CM

## 2019-12-11 ENCOUNTER — Encounter: Payer: Self-pay | Admitting: Physical Therapy

## 2019-12-11 NOTE — Therapy (Signed)
[place], [place], [address] Phone: [phone]   Fax:  [phone]  Physical Therapy Treatment  Patient Details  Name: Kelly [name] MRN: [id number] Date of Birth: Sep 19, 1965 Referring Provider (PT): Dr Sherley Bounds    Encounter Date: 12/10/2019  PT End of Session - 12/11/19 1532    Visit Number  10    Number of Visits  12    Date for PT Re-Evaluation  12/25/19    Authorization Type  New medicaid re-eval next visit    PT Start Time  1500    PT Stop Time  1555    PT Time Calculation (min)  55 min    Activity Tolerance  Patient tolerated treatment well    Behavior During Therapy  [facility] for tasks assessed/performed       Past Medical History:  Diagnosis Date  . Anal fistula    intersphincteric   . Anticardiolipin antibody positive   . Cervicalgia    followed by dr Sherley Bounds  . History of herpes genitalis    many yrs ago  . History of kidney stones   . Hyperlipidemia   . Hypertension    followed by pcp   (11-25-2019  per pt had stress test approx. 2018 w/ Dr Wynonia Lawman, told normal)  . Migraines    neurologist--- dr Jaynee Eagles  . OSA on CPAP   . TMJ (dislocation of temporomandibular joint)    11-25-2019  jaw pops, no appliance  . Wears glasses     Past Surgical History:  Procedure Laterality Date  . ANTERIOR CERVICAL DECOMP/DISCECTOMY FUSION  12/ 2020   @[facility]   C4 - 6  . CESAREAN SECTION     X3   LAST ONE 11-30-2004  . CYSTOSCOPY W/ RETROGRADES Right 10/29/2012   Procedure: CYSTOSCOPY WITH RETROGRADE PYELOGRAM and stent placement;  Surgeon: Reece Packer, MD;  Location: [facility];  Service: Urology;  Laterality: Right;  rt stent placement , rt retrograde and cysto   . CYSTOSCOPY W/ URETERAL STENT PLACEMENT Right 11/23/2012   Procedure: CYSTOSCOPY WITH STENT REPLACEMENT;  Surgeon: Alexis Frock, MD;  Location: [facility];  Service: Urology;   Laterality: Right;  . CYSTOSCOPY/RETROGRADE/URETEROSCOPY/STONE EXTRACTION WITH BASKET Right 11/23/2012   Procedure: CYSTOSCOPY/RETROGRADE/URETEROSCOPY/STONE EXTRACTION WITH BASKET;  Surgeon: Alexis Frock, MD;  Location: [facility];  Service: Urology;  Laterality: Right;    There were no vitals filed for this visit.  Subjective Assessment - 12/10/19 1521    Subjective  Patient reports continued symptoms but she is still working on her exercises at home. She is having less shaking.    Pertinent History  TMJ, migranes,    Limitations  Sitting    How long can you stand comfortably?  standing is OK    How long can you walk comfortably?  no real diffulty    Diagnostic tests  X-rays showed good fusion    Patient Stated Goals  Less pain/ To be able to go back to work    Currently in Pain?  Yes    Pain Score  4     Pain Location  Neck    Pain Orientation  Right    Pain Descriptors / Indicators  Aching    Pain Type  Chronic pain    Pain Onset  Today    Pain Frequency  Constant    Aggravating Factors   use of the arm    Pain  Relieving Factors  rest    Effect of Pain on Daily Activities  unable to work                       [facility] Adult PT Treatment/Exercise - 12/11/19 0001      Neck Exercises: Machines for Strengthening   UBE (Upper Arm Bike)  2 mins posterior     Other Machines for Strengthening  shoulder pulley x2 min       Neck Exercises: Standing   Other Standing Exercises  scap retraction x20 green; shoulder extension x20 green     Other Standing Exercises  UE ranger 2x10       Neck Exercises: Seated   Other Seated Exercise  bilATERAL ER GREEN 2X10       Neck Exercises: Supine   Other Supine Exercise  supine wand flexion 2x10 1lb       Lumbar Exercises: Standing   Other Standing Lumbar Exercises  scap retraction cx20 green shoulder extension x20 green       Lumbar Exercises: Seated   Other Seated Lumbar Exercises  seated shoulder flexion 2x10  1lb       Moist Heat Therapy   Moist Heat Location  Cervical      Electrical Stimulation   Electrical Stimulation Location  cervical spine     Electrical Stimulation Action  ifc     Electrical Stimulation Parameters  TO TOLERANCE     Electrical Stimulation Goals  Pain      Manual Therapy   Manual Therapy  Passive ROM    Soft tissue mobilization  trigger point release to cervical parapinals, upper trap , IASTYM to upper trap and shoulder.     Manual Traction  sub-occipital release       Neck Exercises: Stretches   Upper Trapezius Stretch  3 reps;20 seconds    Levator Stretch  3 reps;20 seconds             PT Education - 12/11/19 1531    Education Details  hep, symptom mangement    Person(s) Educated  Patient    Methods  Explanation;Tactile cues;Verbal cues;Demonstration    Comprehension  Returned demonstration;Verbalized understanding;Verbal cues required;Tactile cues required       PT Short Term Goals - 10/24/19 1212      PT SHORT TERM GOAL #1   Title  Patient will be indepdent with relaxation and breathing techniques to reduce actue inflamtion.    Baseline  independent    Time  3    Period  Weeks    Status  On-going    Target Date  11/08/19      PT SHORT TERM GOAL #2   Title  Patient will report no radiating pain down both of her amrs    Baseline  intermitent depending on activity    Time  3    Period  Weeks    Status  On-going    Target Date  10/21/19      PT SHORT TERM GOAL #3   Title  Patient will increase gross bilateral UE strength to 4/5    Baseline  3/10 today    Period  Weeks    Status  On-going    Target Date  10/21/19        PT Long Term Goals - 12/11/19 1538      PT LONG TERM GOAL #1   Title  Patient will demonstrate 60 degrees of passive rotation ROM bilateral without  pain    Baseline  improved but not 60    Time  6    Period  Weeks    Status  On-going      PT LONG TERM GOAL #2   Title  Patient will sit at her desk for 4 hours  without pain    Baseline  incfrease pain as she sits    Time  6    Period  Weeks    Status  On-going      PT LONG TERM GOAL #3   Title  Patient will reach overhead to acabinet without pain    Baseline  patients hand goes numb    Time  6    Period  Weeks    Status  On-going      PT LONG TERM GOAL #4   Title  increase FOTO score to </= 35% limited to demo improvement in function     Baseline  not taken    Time  6    Period  Weeks    Status  On-going      PT LONG TERM GOAL #5   Title  pt to be I with all HEP given as of last visit to maintain and progress current level of function     Baseline  working on her exercises    Time  6    Period  Weeks    Status  On-going            Plan - 12/11/19 1532    Clinical Impression Statement  Therapy advanced t-band to green for seated exercises. She had no increase in pain. Therapy continues to progress ther-ex. She seems to have similar reactions no matter which exercises are done. She has numbness in her pinkie but oit dosnet last too long. She has soreness the next day but it reloves the day after. Therapy will continue with a slow steady progression of activity.    Personal Factors and Comorbidities  Comorbidity 1;Comorbidity 2;Profession    Comorbidities  history of cervical spine pain, TMJ    Examination-Participation Restrictions  Church;Cleaning;Community Activity;Laundry;Meal Prep    Stability/Clinical Decision Making  Evolving/Moderate complexity    Clinical Decision Making  Moderate    Rehab Potential  Good    PT Frequency  2x / week    PT Duration  6 weeks    PT Treatment/Interventions  ADLs/Self Care Home Management;Cryotherapy;Electrical Stimulation;Iontophoresis 4mg /ml Dexamethasone;Moist Heat;Ultrasound;Traction;DME Instruction;Gait training;Stair training;Functional mobility training;Therapeutic activities;Therapeutic exercise;Neuromuscular re-education;Patient/family education;Manual techniques;Passive range of  motion;Dry needling;Taping    PT Next Visit Plan  re-eval for mediciad    PT Home Exercise Plan  scap retraction    Consulted and Agree with Plan of Care  Patient       Patient will benefit from skilled therapeutic intervention in order to improve the following deficits and impairments:  Pain, Increased muscle spasms, Decreased activity tolerance, Decreased strength, Decreased range of motion, Decreased endurance, Increased fascial restricitons  Visit Diagnosis: Cervicalgia  Other muscle spasm  Acute pain of left shoulder  Stiffness of left shoulder, not elsewhere classified  Chronic pain of left knee     Problem List Patient Active Problem List   Diagnosis Date Noted  . Stressful life event affecting family 07/12/2019  . Caregiver burden 07/12/2019  . Tobacco abuse counseling 07/12/2019  . Protrusion of cervical intervertebral disc 04/10/2019  . Spinal stenosis of cervical region 04/10/2019  . Overactive bladder 11/19/2018  . Photosensitization due to sun 11/02/2018  . Generalized  pain 11/02/2018  . High risk medication use 11/02/2018  . Foot pain, bilateral 05/10/2018  . Heel pain, bilateral 05/10/2018  . Plantar fasciitis 05/10/2018  . Left knee pain 05/10/2018  . Patellofemoral pain syndrome of both knees 05/10/2018  . Generalized abdominal pain 04/17/2018  . Flank pain 04/17/2018  . Acute back pain 04/17/2018  . History of renal stone 04/17/2018  . Urinary frequency 04/17/2018  . Laceration of left hand 01/31/2018  . Decreased range of motion of finger of left hand 01/31/2018  . Localized swelling on left hand 01/31/2018  . Chronic migraine without aura without status migrainosus, not intractable 11/01/2016  . History of migraine 06/10/2016  . Diarrhea 05/20/2016  . Pain in the chest 05/11/2016  . Dizziness and giddiness 05/11/2016  . History of hypertension 05/11/2016  . Hemorrhoidal skin tag 04/07/2015  . IUD (intrauterine device) in place 05/03/2013  .  Allergic rhinitis 01/11/2013  . Current smoker 01/11/2013  . Weight gain 06/26/2012  . Cervical stenosis (uterine cervix) 04/25/2012  . Anticardiolipin antibody positive   . Hyperlipidemia   . Hypertension   . History of trichomoniasis 01/18/2011    Class: History of  . THYROID NODULE, RIGHT 02/05/2008  . GERD 02/05/2008  . NECK PAIN 02/05/2008    Carney Living PT DPT  12/11/2019, 3:53 PM  [facility] [address] [place], [place], [address] Phone: [phone]   Fax:  [phone]  Name: Kelly [name] MRN: [id number] Date of Birth: [birth year]

## 2019-12-17 ENCOUNTER — Ambulatory Visit: Payer: 59 | Admitting: Physical Therapy

## 2019-12-17 ENCOUNTER — Encounter: Payer: Self-pay | Admitting: Physical Therapy

## 2019-12-17 ENCOUNTER — Other Ambulatory Visit: Payer: Self-pay

## 2019-12-17 DIAGNOSIS — M25512 Pain in left shoulder: Secondary | ICD-10-CM | POA: Diagnosis not present

## 2019-12-17 DIAGNOSIS — M62838 Other muscle spasm: Secondary | ICD-10-CM

## 2019-12-17 DIAGNOSIS — M542 Cervicalgia: Secondary | ICD-10-CM

## 2019-12-17 DIAGNOSIS — M25612 Stiffness of left shoulder, not elsewhere classified: Secondary | ICD-10-CM | POA: Diagnosis not present

## 2019-12-17 DIAGNOSIS — G8929 Other chronic pain: Secondary | ICD-10-CM | POA: Diagnosis not present

## 2019-12-17 DIAGNOSIS — M25562 Pain in left knee: Secondary | ICD-10-CM | POA: Diagnosis not present

## 2019-12-17 MED FILL — OXYCODONE-ACETAMINOPHEN 5-3: 5-325 | 10 days supply | Qty: 30 | Fill #0

## 2019-12-18 ENCOUNTER — Encounter: Payer: Self-pay | Admitting: Physical Therapy

## 2019-12-18 NOTE — Therapy (Signed)
[place], [place], [address] Phone: [phone]   Fax:  [phone]  Physical Therapy Treatment  Patient Details  Name: Kelly [name] MRN: [id number] Date of Birth: Apr 16, 1966 Referring Provider (PT): Dr Sherley Bounds    Encounter Date: 12/17/2019  PT End of Session - 12/18/19 0754    Visit Number  10    Number of Visits  13    Date for PT Re-Evaluation  01/15/20    Authorization Type  Medicaid Re-eval    PT Start Time  1630    PT Stop Time  1725    PT Time Calculation (min)  55 min    Activity Tolerance  Patient tolerated treatment well    Behavior During Therapy  [facility] for tasks assessed/performed       Past Medical History:  Diagnosis Date  . Anal fistula    intersphincteric   . Anticardiolipin antibody positive   . Cervicalgia    followed by dr Sherley Bounds  . History of herpes genitalis    many yrs ago  . History of kidney stones   . Hyperlipidemia   . Hypertension    followed by pcp   (11-25-2019  per pt had stress test approx. 2018 w/ Dr Wynonia Lawman, told normal)  . Migraines    neurologist--- dr Jaynee Eagles  . OSA on CPAP   . TMJ (dislocation of temporomandibular joint)    11-25-2019  jaw pops, no appliance  . Wears glasses     Past Surgical History:  Procedure Laterality Date  . ANTERIOR CERVICAL DECOMP[facility]CTOMY [facility]  [facility]   [facility] - [facility]  . CESAREAN SECTION     X3   LAST ONE 11-30-2004  . CYSTOSCOPY W/ RETROGRADES Right 10/29/2012   Procedure: CYSTOSCOPY WITH RETROGRADE PYELOGRAM and stent placement;  Surgeon: Reece Packer, MD;  Location: [facility];  Service: Urology;  Laterality: Right;  rt stent placement , rt retrograde and cysto   . CYSTOSCOPY W/ URETERAL STENT PLACEMENT Right 11/23/2012   Procedure: CYSTOSCOPY WITH STENT REPLACEMENT;  Surgeon: Alexis Frock, MD;  Location: [facility];  Service: Urology;  Laterality:  Right;  . CYSTOSCOPY/RETROGRADE/URETEROSCOPY/STONE EXTRACTION WITH BASKET Right 11/23/2012   Procedure: CYSTOSCOPY/RETROGRADE/URETEROSCOPY/STONE EXTRACTION WITH BASKET;  Surgeon: Alexis Frock, MD;  Location: [facility];  Service: Urology;  Laterality: Right;    There were no vitals filed for this visit.  Subjective Assessment - 12/17/19 1637    Subjective  Patient is stiff today. She can not remember doing anything to make her stiff. She is having burning pain into her shoulderblades.    Pertinent History  TMJ, migranes,    Limitations  Sitting    How long can you stand comfortably?  standing is OK    How long can you walk comfortably?  no real diffulty    Diagnostic tests  X-rays showed good fusion    Patient Stated Goals  Less pain/ To be able to go back to work    Currently in Pain?  Yes    Pain Score  6     Pain Location  Neck    Pain Orientation  Right;Left    Pain Descriptors / Indicators  Aching    Pain Type  Chronic pain    Pain Radiating Towards  burning into her shoulder blades    Pain Onset  Today    Pain Frequency  Constant  Aggravating Factors   use of the arm    Pain Relieving Factors  rest    Effect of Pain on Daily Activities  unable to work    Multiple Pain Sites  No         [facility] PT Assessment - 12/18/19 0001      AROM   Right Shoulder Flexion  140 Degrees    Left Shoulder Flexion  140 Degrees    Cervical - Right Rotation  40    Cervical - Left Rotation  40      Strength   Right Shoulder Flexion  4/5    Right Shoulder Internal Rotation  4/5    Right Shoulder External Rotation  4/5    Left Shoulder Flexion  4/5    Left Shoulder Internal Rotation  4/5    Left Shoulder External Rotation  4/5                   [facility] Adult PT Treatment/Exercise - 12/18/19 0001      Neck Exercises: Standing   Other Standing Exercises  scap retraction x20 yellow; shoulder extension x20 yellow        Neck Exercises: Seated   Other Seated  Exercise  bilATERAL ER yellow  2X10       Neck Exercises: Supine   Other Supine Exercise  supine wand flexion no weight       Lumbar Exercises: Aerobic   Other Aerobic Exercise  pulleys 2 min       Electrical Stimulation   Electrical Stimulation Location  cervical spine     Electrical Stimulation Action  IFC     Electrical Stimulation Parameters  to tolerance     Electrical Stimulation Goals  Pain      Manual Therapy   Manual Therapy  Passive ROM    Soft tissue mobilization  trigger point release to cervical parapinals, upper trap , IASTYM to upper trap and shoulder.     Manual Traction  sub-occipital release              PT Education - 12/18/19 1153    Education Details  reviewed posture    Person(s) Educated  Patient    Methods  Explanation;Demonstration;Tactile cues;Verbal cues    Comprehension  Verbalized understanding;Returned demonstration;Verbal cues required;Tactile cues required       PT Short Term Goals - 12/18/19 0807      PT SHORT TERM GOAL #1   Title  Patient will be indepdent with relaxation and breathing techniques to reduce actue inflamtion.    Baseline  independent    Time  3    Period  Weeks    Status  Achieved    Target Date  11/08/19      PT SHORT TERM GOAL #2   Title  Patient will report no radiating pain down both of her amrs    Baseline  intermitent depending on activity    Time  3    Period  Weeks    Status  On-going    Target Date  01/15/20      PT SHORT TERM GOAL #3   Title  Patient will increase gross bilateral UE strength to 4+/5    Baseline  4/5 gross bilateeral LE    Time  3    Period  Weeks    Status  Revised    Target Date  01/08/20        PT Long Term Goals - 12/18/19 [id number]  PT LONG TERM GOAL #1   Title  Patient will demonstrate 60 degrees of passive rotation ROM bilateral without pain    Baseline  for degrees bilateral today    Time  6    Period  Weeks    Status  On-going    Target Date  01/29/20      PT  LONG TERM GOAL #2   Title  Patient will sit at her desk for 4 hours without pain    Baseline  can perfrom 30-45 mintes of work at computer but then has severe pain    Time  6    Period  Weeks    Status  On-going      PT LONG TERM GOAL #3   Title  Patient will reach overhead to a cabinet without pain    Baseline  pinkie continues to go numb    Time  6    Period  Weeks    Status  On-going    Target Date  01/29/20      PT LONG TERM GOAL #4   Title  increase FOTO score to </= 35% limited to demo improvement in function     Baseline  not taken today    Period  Weeks    Status  On-going      PT LONG TERM GOAL #5   Title  pt to be I with all HEP given as of last visit to maintain and progress current level of function     Baseline  working on her exercises    Time  6    Period  Weeks    Status  On-going    Target Date  01/08/20            Plan - 12/18/19 0758    Clinical Impression Statement  Patient was stiff and sore today. Therapy focused more on manal therapy and light exercises. Her strength was measured at 4/5 gross bilateral shoulder with minor pain. Her neck range is decreased to 40 bilateral from last re-assessment. She is trying to do more at home but continues to have numbness in her pinkie. She recently obtained medicaid so we will continue 1x a week for 3 weeks prer Mediciad guidlines. If her pain levels decrease we will continue with more agressive ther-ex next visit. She ereported improved pain after treatment.    Personal Factors and Comorbidities  Comorbidity 1;Comorbidity 2;Profession    Comorbidities  history of cervical spine pain, TMJ    Examination-Activity Limitations  Lift;Reach Overhead;Locomotion Level    Examination-Participation Restrictions  Church;Cleaning;Community Activity;Laundry;Meal Prep    Stability/Clinical Decision Making  Evolving/Moderate complexity    Clinical Decision Making  Moderate    Rehab Potential  Good    PT Frequency  2x / week     PT Duration  6 weeks    PT Treatment/Interventions  ADLs/Self Care Home Management;Cryotherapy;Electrical Stimulation;Iontophoresis 4mg /ml Dexamethasone;Moist Heat;Ultrasound;Traction;DME Instruction;Gait training;Stair training;Functional mobility training;Therapeutic activities;Therapeutic exercise;Neuromuscular re-education;Patient/family education;Manual techniques;Passive range of motion;Dry needling;Taping    PT Next Visit Plan  re-eval for mediciad    PT Home Exercise Plan  scap retraction    Consulted and Agree with Plan of Care  Patient       Patient will benefit from skilled therapeutic intervention in order to improve the following deficits and impairments:  Pain, Increased muscle spasms, Decreased activity tolerance, Decreased strength, Decreased range of motion, Decreased endurance, Increased fascial restricitons  Visit Diagnosis: Cervicalgia - Plan: PT plan of care cert/re-cert  Other muscle spasm - Plan: PT plan of care cert/re-cert     Problem List Patient Active Problem List   Diagnosis Date Noted  . Stressful life event affecting family 07/12/2019  . Caregiver burden 07/12/2019  . Tobacco abuse counseling 07/12/2019  . Protrusion of cervical intervertebral disc 04/10/2019  . Spinal stenosis of cervical region 04/10/2019  . Overactive bladder 11/19/2018  . Photosensitization due to sun 11/02/2018  . Generalized pain 11/02/2018  . High risk medication use 11/02/2018  . Foot pain, bilateral 05/10/2018  . Heel pain, bilateral 05/10/2018  . Plantar fasciitis 05/10/2018  . Left knee pain 05/10/2018  . Patellofemoral pain syndrome of both knees 05/10/2018  . Generalized abdominal pain 04/17/2018  . Flank pain 04/17/2018  . Acute back pain 04/17/2018  . History of renal stone 04/17/2018  . Urinary frequency 04/17/2018  . Laceration of left hand 01/31/2018  . Decreased range of motion of finger of left hand 01/31/2018  . Localized swelling on left hand 01/31/2018   . Chronic migraine without aura without status migrainosus, not intractable 11/01/2016  . History of migraine 06/10/2016  . Diarrhea 05/20/2016  . Pain in the chest 05/11/2016  . Dizziness and giddiness 05/11/2016  . History of hypertension 05/11/2016  . Hemorrhoidal skin tag 04/07/2015  . IUD (intrauterine device) in place 05/03/2013  . Allergic rhinitis 01/11/2013  . Current smoker 01/11/2013  . Weight gain 06/26/2012  . Cervical stenosis (uterine cervix) 04/25/2012  . Anticardiolipin antibody positive   . Hyperlipidemia   . Hypertension   . History of trichomoniasis 01/18/2011    Class: History of  . THYROID NODULE, RIGHT 02/05/2008  . GERD 02/05/2008  . NECK PAIN 02/05/2008    Carney Living PT DPT  12/18/2019, 11:54 AM  [facility] [address] [place], [place], [address] Phone: [phone]   Fax:  [phone]  Name: Kelly [name] MRN: [id number] Date of Birth: 01/14/1966

## 2019-12-24 ENCOUNTER — Encounter: Payer: 59 | Admitting: Physical Therapy

## 2019-12-31 ENCOUNTER — Encounter: Payer: Self-pay | Admitting: Physical Therapy

## 2019-12-31 ENCOUNTER — Telehealth: Payer: Self-pay | Admitting: Medical

## 2019-12-31 ENCOUNTER — Other Ambulatory Visit: Payer: Self-pay

## 2019-12-31 ENCOUNTER — Ambulatory Visit: Payer: 59 | Attending: Neurological Surgery | Admitting: Physical Therapy

## 2019-12-31 DIAGNOSIS — M25562 Pain in left knee: Secondary | ICD-10-CM | POA: Diagnosis not present

## 2019-12-31 DIAGNOSIS — M25612 Stiffness of left shoulder, not elsewhere classified: Secondary | ICD-10-CM | POA: Diagnosis not present

## 2019-12-31 DIAGNOSIS — G8929 Other chronic pain: Secondary | ICD-10-CM | POA: Diagnosis not present

## 2019-12-31 DIAGNOSIS — R2689 Other abnormalities of gait and mobility: Secondary | ICD-10-CM | POA: Diagnosis not present

## 2019-12-31 DIAGNOSIS — M542 Cervicalgia: Secondary | ICD-10-CM | POA: Diagnosis not present

## 2019-12-31 DIAGNOSIS — M25512 Pain in left shoulder: Secondary | ICD-10-CM | POA: Insufficient documentation

## 2019-12-31 DIAGNOSIS — M62838 Other muscle spasm: Secondary | ICD-10-CM | POA: Insufficient documentation

## 2019-12-31 DIAGNOSIS — M25572 Pain in left ankle and joints of left foot: Secondary | ICD-10-CM | POA: Diagnosis not present

## 2019-12-31 DIAGNOSIS — R600 Localized edema: Secondary | ICD-10-CM | POA: Diagnosis not present

## 2019-12-31 NOTE — Telephone Encounter (Signed)
Please process referral

## 2019-12-31 NOTE — Therapy (Signed)
[place], [place], [address] Phone: [phone]   Fax:  [phone]  Physical Therapy Treatment  Patient Details  Name: Kelly [name] MRN: [id number] Date of Birth: 01-20-1966 Referring Provider (PT): Dr Sherley Bounds    Encounter Date: 12/31/2019  PT End of Session - 12/31/19 1210    Visit Number  11    Number of Visits  13    Date for PT Re-Evaluation  01/15/20    Authorization Type  Medicaid Re-eval    PT Start Time  [id number]    PT Stop Time  1243    PT Time Calculation (min)  58 min    Activity Tolerance  Patient tolerated treatment well    Behavior During Therapy  [facility] for tasks assessed/performed       Past Medical History:  Diagnosis Date  . Anal fistula    intersphincteric   . Anticardiolipin antibody positive   . Cervicalgia    followed by dr Sherley Bounds  . History of herpes genitalis    many yrs ago  . History of kidney stones   . Hyperlipidemia   . Hypertension    followed by pcp   (11-25-2019  per pt had stress test approx. 2018 w/ Dr Wynonia Lawman, told normal)  . Migraines    neurologist--- dr Jaynee Eagles  . OSA on CPAP   . TMJ (dislocation of temporomandibular joint)    11-25-2019  jaw pops, no appliance  . Wears glasses     Past Surgical History:  Procedure Laterality Date  . ANTERIOR CERVICAL DECOMP/DISCECTOMY FUSION  12/ 2020   @[facility]   C4 - 6  . CESAREAN SECTION     X3   LAST ONE 11-30-2004  . CYSTOSCOPY W/ RETROGRADES Right 10/29/2012   Procedure: CYSTOSCOPY WITH RETROGRADE PYELOGRAM and stent placement;  Surgeon: Reece Packer, MD;  Location: [facility];  Service: Urology;  Laterality: Right;  rt stent placement , rt retrograde and cysto   . CYSTOSCOPY W/ URETERAL STENT PLACEMENT Right 11/23/2012   Procedure: CYSTOSCOPY WITH STENT REPLACEMENT;  Surgeon: Alexis Frock, MD;  Location: [facility];  Service: Urology;  Laterality:  Right;  . CYSTOSCOPY/RETROGRADE/URETEROSCOPY/STONE EXTRACTION WITH BASKET Right 11/23/2012   Procedure: CYSTOSCOPY/RETROGRADE/URETEROSCOPY/STONE EXTRACTION WITH BASKET;  Surgeon: Alexis Frock, MD;  Location: [facility];  Service: Urology;  Laterality: Right;    There were no vitals filed for this visit.  Subjective Assessment - 12/31/19 1152    Subjective  Patient reports her pain today is about a 3/10. She drove to the beach today and did not have much pain.    Pertinent History  TMJ, migranes,    Limitations  Sitting    How long can you stand comfortably?  standing is OK    How long can you walk comfortably?  no real diffulty    Diagnostic tests  X-rays showed good fusion    Patient Stated Goals  Less pain/ To be able to go back to work    Currently in Pain?  Yes    Pain Score  4     Pain Location  Neck    Pain Orientation  Right;Left    Pain Descriptors / Indicators  Aching    Pain Type  Chronic pain    Aggravating Factors   use of the arm    Pain Relieving Factors  rest    Effect of Pain on Daily Activities  unable  to work    Pain Radiating Towards  pain and numbness into the right hand. right pinkie goes numb    Pain Onset  More than a month ago    Pain Relieving Factors  rest    Effect of Pain on Daily Activities  difficulty using her arms                       [facility] Adult PT Treatment/Exercise - 12/31/19 0001      Neck Exercises: Standing   Other Standing Exercises  scap retraction x20 green; shoulder extension x20 green     Other Standing Exercises  standing wand flexion 2x10 2lb       Neck Exercises: Seated   Other Seated Exercise  bilateral er 2x10 red       Neck Exercises: Supine   Other Supine Exercise  supine wand flexion no weight       Lumbar Exercises: Aerobic   Other Aerobic Exercise  pulleys 2 min       Electrical Stimulation   Electrical Stimulation Location  cervical spine     Electrical Stimulation Action  IFC      Electrical Stimulation Parameters  to tolerance     Electrical Stimulation Goals  Pain      Manual Therapy   Manual Therapy  Passive ROM    Soft tissue mobilization  trigger point release to cervical parapinals, upper trap , IASTYM to upper trap and shoulder.     Manual Traction  sub-occipital release              PT Education - 12/31/19 1210    Education Details  reviewed technique with HEP    Person(s) Educated  Patient    Methods  Explanation;Demonstration;Tactile cues;Verbal cues    Comprehension  Verbalized understanding;Returned demonstration;Verbal cues required;Tactile cues required       PT Short Term Goals - 12/18/19 0807      PT SHORT TERM GOAL #1   Title  Patient will be indepdent with relaxation and breathing techniques to reduce actue inflamtion.    Baseline  independent    Time  3    Period  Weeks    Status  Achieved    Target Date  11/08/19      PT SHORT TERM GOAL #2   Title  Patient will report no radiating pain down both of her amrs    Baseline  intermitent depending on activity    Time  3    Period  Weeks    Status  On-going    Target Date  01/15/20      PT SHORT TERM GOAL #3   Title  Patient will increase gross bilateral UE strength to 4+/5    Baseline  4/5 gross bilateeral LE    Time  3    Period  Weeks    Status  Revised    Target Date  01/08/20        PT Long Term Goals - 12/18/19 0809      PT LONG TERM GOAL #1   Title  Patient will demonstrate 60 degrees of passive rotation ROM bilateral without pain    Baseline  for degrees bilateral today    Time  6    Period  Weeks    Status  On-going    Target Date  01/29/20      PT LONG TERM GOAL #2   Title  Patient will sit at her desk  for 4 hours without pain    Baseline  can perfrom 30-45 mintes of work at computer but then has severe pain    Time  6    Period  Weeks    Status  On-going      PT LONG TERM GOAL #3   Title  Patient will reach overhead to a cabinet without pain     Baseline  pinkie continues to go numb    Time  6    Period  Weeks    Status  On-going    Target Date  01/29/20      PT LONG TERM GOAL #4   Title  increase FOTO score to </= 35% limited to demo improvement in function     Baseline  not taken today    Period  Weeks    Status  On-going      PT LONG TERM GOAL #5   Title  pt to be I with all HEP given as of last visit to maintain and progress current level of function     Baseline  working on her exercises    Time  6    Period  Weeks    Status  On-going    Target Date  01/08/20            Plan - 12/31/19 1330    Clinical Impression Statement  Patient tolerated treatment well. She has pain with ther-ex but can complete ther-ex. She continues to have significant spasming of her upper traps. This dose not appear to be better or worse despite driving.    Personal Factors and Comorbidities  Comorbidity 1;Comorbidity 2;Profession    Comorbidities  history of cervical spine pain, TMJ    Examination-Activity Limitations  Lift;Reach Overhead;Locomotion Level    Examination-Participation Restrictions  Church;Cleaning;Community Activity;Laundry;Meal Prep    Stability/Clinical Decision Making  Evolving/Moderate complexity    Clinical Decision Making  Moderate    Rehab Potential  Good    PT Frequency  2x / week    PT Duration  6 weeks    PT Treatment/Interventions  ADLs/Self Care Home Management;Cryotherapy;Electrical Stimulation;Iontophoresis 4mg /ml Dexamethasone;Moist Heat;Ultrasound;Traction;DME Instruction;Gait training;Stair training;Functional mobility training;Therapeutic activities;Therapeutic exercise;Neuromuscular re-education;Patient/family education;Manual techniques;Passive range of motion;Dry needling;Taping    PT Next Visit Plan  re-eval for mediciad    PT Home Exercise Plan  t-band extensin , scpation , bilateral er,    Consulted and Agree with Plan of Care  Patient       Patient will benefit from skilled therapeutic  intervention in order to improve the following deficits and impairments:  Pain, Increased muscle spasms, Decreased activity tolerance, Decreased strength, Decreased range of motion, Decreased endurance, Increased fascial restricitons  Visit Diagnosis: Cervicalgia  Other muscle spasm  Acute pain of left shoulder  Stiffness of left shoulder, not elsewhere classified  Chronic pain of left knee  Other abnormalities of gait and mobility  Pain in left ankle and joints of left foot  Localized edema     Problem List Patient Active Problem List   Diagnosis Date Noted  . Stressful life event affecting family 07/12/2019  . Caregiver burden 07/12/2019  . Tobacco abuse counseling 07/12/2019  . Protrusion of cervical intervertebral disc 04/10/2019  . Spinal stenosis of cervical region 04/10/2019  . Overactive bladder 11/19/2018  . Photosensitization due to sun 11/02/2018  . Generalized pain 11/02/2018  . High risk medication use 11/02/2018  . Foot pain, bilateral 05/10/2018  . Heel pain, bilateral 05/10/2018  . Plantar fasciitis 05/10/2018  .  Left knee pain 05/10/2018  . Patellofemoral pain syndrome of both knees 05/10/2018  . Generalized abdominal pain 04/17/2018  . Flank pain 04/17/2018  . Acute back pain 04/17/2018  . History of renal stone 04/17/2018  . Urinary frequency 04/17/2018  . Laceration of left hand 01/31/2018  . Decreased range of motion of finger of left hand 01/31/2018  . Localized swelling on left hand 01/31/2018  . Chronic migraine without aura without status migrainosus, not intractable 11/01/2016  . History of migraine 06/10/2016  . Diarrhea 05/20/2016  . Pain in the chest 05/11/2016  . Dizziness and giddiness 05/11/2016  . History of hypertension 05/11/2016  . Hemorrhoidal skin tag 04/07/2015  . IUD (intrauterine device) in place 05/03/2013  . Allergic rhinitis 01/11/2013  . Current smoker 01/11/2013  . Weight gain 06/26/2012  . Cervical stenosis  (uterine cervix) 04/25/2012  . Anticardiolipin antibody positive   . Hyperlipidemia   . Hypertension   . History of trichomoniasis 01/18/2011    Class: History of  . THYROID NODULE, RIGHT 02/05/2008  . GERD 02/05/2008  . NECK PAIN 02/05/2008    Carney Living PT DPT  12/31/2019, 1:54 PM  [facility] [address] [place], [place], [address] Phone: [phone]   Fax:  [phone]  Name: MYKERIA GUARDIOLA MRN: [id number] Date of Birth: [birth year]

## 2019-12-31 NOTE — Telephone Encounter (Signed)
Pt called and states that she now has medicaid and a referral is required for all appts. She has a reoccurring sun allergy and has made an appt with [place] at [facility]. She is requesting a referral be sent over. They can be reached at [phone] and fax number is [phone]. Pt can be reached at [phone].

## 2020-01-01 ENCOUNTER — Telehealth: Payer: Self-pay | Admitting: Medical

## 2020-01-01 NOTE — Telephone Encounter (Signed)
Please refer to dermatology for photosensitive rash/shaw type rash

## 2020-01-01 NOTE — Telephone Encounter (Signed)
Referral notes has been faxed.

## 2020-01-02 NOTE — Telephone Encounter (Signed)
Is this a separate referral? Patient asked to be sent to skin surgery center. Please advise

## 2020-01-02 NOTE — Telephone Encounter (Signed)
Dermatology was my understanding.  For example, many dermatology offices are skin surgery centers  She sent me an email, but it was regarding unusual rash

## 2020-01-02 NOTE — Telephone Encounter (Signed)
Referral was sent.

## 2020-01-03 DIAGNOSIS — F4312 Post-traumatic stress disorder, chronic: Secondary | ICD-10-CM | POA: Diagnosis not present

## 2020-01-07 ENCOUNTER — Ambulatory Visit: Payer: 59 | Admitting: Physical Therapy

## 2020-01-07 ENCOUNTER — Other Ambulatory Visit: Payer: Self-pay

## 2020-01-07 ENCOUNTER — Encounter: Payer: Self-pay | Admitting: Physical Therapy

## 2020-01-07 DIAGNOSIS — M25562 Pain in left knee: Secondary | ICD-10-CM | POA: Diagnosis not present

## 2020-01-07 DIAGNOSIS — M62838 Other muscle spasm: Secondary | ICD-10-CM

## 2020-01-07 DIAGNOSIS — M542 Cervicalgia: Secondary | ICD-10-CM

## 2020-01-07 DIAGNOSIS — R600 Localized edema: Secondary | ICD-10-CM | POA: Diagnosis not present

## 2020-01-07 DIAGNOSIS — R2689 Other abnormalities of gait and mobility: Secondary | ICD-10-CM | POA: Diagnosis not present

## 2020-01-07 DIAGNOSIS — M25512 Pain in left shoulder: Secondary | ICD-10-CM

## 2020-01-07 DIAGNOSIS — M25572 Pain in left ankle and joints of left foot: Secondary | ICD-10-CM | POA: Diagnosis not present

## 2020-01-07 DIAGNOSIS — M25612 Stiffness of left shoulder, not elsewhere classified: Secondary | ICD-10-CM | POA: Diagnosis not present

## 2020-01-07 DIAGNOSIS — G8929 Other chronic pain: Secondary | ICD-10-CM | POA: Diagnosis not present

## 2020-01-07 NOTE — Therapy (Signed)
[place], [place], [address] Phone: [phone]   Fax:  [phone]  Physical Therapy Treatment  Patient Details  Name: Kelly [name] MRN: [id number] Date of Birth: 1965/12/12 Referring Provider (PT): Dr Sherley Bounds    Encounter Date: 01/07/2020  PT End of Session - 01/07/20 1340    Visit Number  11    Number of Visits  13    Date for PT Re-Evaluation  01/15/20    Authorization Type  Medicaid Re-eval next visit    PT Start Time  1333    PT Stop Time  1431    PT Time Calculation (min)  58 min    Activity Tolerance  Patient tolerated treatment well    Behavior During Therapy  [facility] for tasks assessed/performed       Past Medical History:  Diagnosis Date  . Anal fistula    intersphincteric   . Anticardiolipin antibody positive   . Cervicalgia    followed by dr Sherley Bounds  . History of herpes genitalis    many yrs ago  . History of kidney stones   . Hyperlipidemia   . Hypertension    followed by pcp   (11-25-2019  per pt had stress test approx. 2018 w/ Dr Wynonia Lawman, told normal)  . Migraines    neurologist--- dr Jaynee Eagles  . OSA on CPAP   . TMJ (dislocation of temporomandibular joint)    11-25-2019  jaw pops, no appliance  . Wears glasses     Past Surgical History:  Procedure Laterality Date  . ANTERIOR CERVICAL DECOMP/DISCECTOMY FUSION  12/ 2020   @[email or web address]   C4 - 6  . CESAREAN SECTION     X3   LAST ONE 11-30-2004  . CYSTOSCOPY W/ RETROGRADES Right 10/29/2012   Procedure: CYSTOSCOPY WITH RETROGRADE PYELOGRAM and stent placement;  Surgeon: Reece Packer, MD;  Location: [place];  Service: Urology;  Laterality: Right;  rt stent placement , rt retrograde and cysto   . CYSTOSCOPY W/ URETERAL STENT PLACEMENT Right 11/23/2012   Procedure: CYSTOSCOPY WITH STENT REPLACEMENT;  Surgeon: Alexis Frock, MD;  Location: [facility];  Service: Urology;   Laterality: Right;  . CYSTOSCOPY/RETROGRADE/URETEROSCOPY/STONE EXTRACTION WITH BASKET Right 11/23/2012   Procedure: CYSTOSCOPY/RETROGRADE/URETEROSCOPY/STONE EXTRACTION WITH BASKET;  Surgeon: Alexis Frock, MD;  Location: [facility];  Service: Urology;  Laterality: Right;    There were no vitals filed for this visit.  Subjective Assessment - 01/07/20 2120    Subjective  Patient feels like the pain is getting better. She continues to have numbness and increased pain when she uses her computer.    Pertinent History  TMJ, migranes,    Limitations  Sitting    How long can you stand comfortably?  standing is OK    How long can you walk comfortably?  no real diffulty    Diagnostic tests  X-rays showed good fusion    Patient Stated Goals  Less pain/ To be able to go back to work    Currently in Pain?  Yes    Pain Score  2     Pain Location  Neck    Pain Orientation  Right;Left    Pain Descriptors / Indicators  Aching    Pain Type  Chronic pain    Pain Onset  Today    Pain Frequency  Constant    Aggravating Factors   use of the arm  Pain Relieving Factors  rest    Effect of Pain on Daily Activities  unable to work                        [facility] Adult PT Treatment/Exercise - 01/07/20 0001      Neck Exercises: Machines for Strengthening   UBE (Upper Arm Bike)  2 mins posterior       Neck Exercises: Standing   Other Standing Exercises  scap retraction x20 green; shoulder extension x20 green     Other Standing Exercises  standing wand flexion 2x10 2lb ; wall walk 2x10       Neck Exercises: Seated   Other Seated Exercise  bilateral er 2x10 red       Neck Exercises: Supine   Other Supine Exercise  supine wand flexion no weight       Lumbar Exercises: Aerobic   UBE (Upper Arm Bike)  2 min posterior     Other Aerobic Exercise  pulleys 2 min       Electrical Stimulation   Electrical Stimulation Location  cervical spine     Electrical Stimulation Action   IC     Electrical Stimulation Parameters  to tolerance    Electrical Stimulation Goals  Pain      Manual Therapy   Manual Therapy  Passive ROM    Soft tissue mobilization  trigger point release to cervical parapinals, upper trap ,    Manual Traction  sub-occipital release              PT Education - 01/07/20 1346    Education Details  reviewed HEp and symptom management    Person(s) Educated  Patient    Methods  Explanation;Demonstration;Tactile cues;Verbal cues    Comprehension  Verbalized understanding;Returned demonstration;Verbal cues required;Tactile cues required       PT Short Term Goals - 12/18/19 0807      PT SHORT TERM GOAL #1   Title  Patient will be indepdent with relaxation and breathing techniques to reduce actue inflamtion.    Baseline  independent    Time  3    Period  Weeks    Status  Achieved    Target Date  11/08/19      PT SHORT TERM GOAL #2   Title  Patient will report no radiating pain down both of her amrs    Baseline  intermitent depending on activity    Time  3    Period  Weeks    Status  On-going    Target Date  01/15/20      PT SHORT TERM GOAL #3   Title  Patient will increase gross bilateral UE strength to 4+/5    Baseline  4/5 gross bilateeral LE    Time  3    Period  Weeks    Status  Revised    Target Date  01/08/20        PT Long Term Goals - 12/18/19 0809      PT LONG TERM GOAL #1   Title  Patient will demonstrate 60 degrees of passive rotation ROM bilateral without pain    Baseline  for degrees bilateral today    Time  6    Period  Weeks    Status  On-going    Target Date  01/29/20      PT LONG TERM GOAL #2   Title  Patient will sit at her desk for 4 hours without  pain    Baseline  can perfrom 30-45 mintes of work at computer but then has severe pain    Time  6    Period  Weeks    Status  On-going      PT LONG TERM GOAL #3   Title  Patient will reach overhead to a cabinet without pain    Baseline  pinkie  continues to go numb    Time  6    Period  Weeks    Status  On-going    Target Date  01/29/20      PT LONG TERM GOAL #4   Title  increase FOTO score to </= 35% limited to demo improvement in function     Baseline  not taken today    Period  Weeks    Status  On-going      PT LONG TERM GOAL #5   Title  pt to be I with all HEP given as of last visit to maintain and progress current level of function     Baseline  working on her exercises    Time  6    Period  Weeks    Status  On-going    Target Date  01/08/20            Plan - 01/07/20 1348    Clinical Impression Statement  Therapy continues to work on shoulder endurance to prepare her for work. Therapy added the finger ladder today. She tolerated well but had decreased endurance with the left UE. Therapy continues to perfrom manual therapy to reduce muslce spasm. Therapy will continue to progress ther-ex as tolerated.    Personal Factors and Comorbidities  Comorbidity 1;Comorbidity 2;Profession    Comorbidities  history of cervical spine pain, TMJ    Examination-Activity Limitations  Lift;Reach Overhead;Locomotion Level    Examination-Participation Restrictions  Church;Cleaning;Community Activity;Laundry;Meal Prep    Stability/Clinical Decision Making  Evolving/Moderate complexity    Clinical Decision Making  Moderate    Rehab Potential  Good    PT Frequency  2x / week    PT Duration  6 weeks    PT Treatment/Interventions  ADLs/Self Care Home Management;Cryotherapy;Electrical Stimulation;Iontophoresis 4mg /ml Dexamethasone;Moist Heat;Ultrasound;Traction;DME Instruction;Gait training;Stair training;Functional mobility training;Therapeutic activities;Therapeutic exercise;Neuromuscular re-education;Patient/family education;Manual techniques;Passive range of motion;Dry needling;Taping    PT Next Visit Plan  re-eval for mediciad    PT Home Exercise Plan  t-band extensin , scpation , bilateral er,    Consulted and Agree with Plan of  Care  Patient       Patient will benefit from skilled therapeutic intervention in order to improve the following deficits and impairments:  Pain, Increased muscle spasms, Decreased activity tolerance, Decreased strength, Decreased range of motion, Decreased endurance, Increased fascial restricitons  Visit Diagnosis: Cervicalgia  Other muscle spasm  Acute pain of left shoulder     Problem List Patient Active Problem List   Diagnosis Date Noted  . Stressful life event affecting family 07/12/2019  . Caregiver burden 07/12/2019  . Tobacco abuse counseling 07/12/2019  . Protrusion of cervical intervertebral disc 04/10/2019  . Spinal stenosis of cervical region 04/10/2019  . Overactive bladder 11/19/2018  . Photosensitization due to sun 11/02/2018  . Generalized pain 11/02/2018  . High risk medication use 11/02/2018  . Foot pain, bilateral 05/10/2018  . Heel pain, bilateral 05/10/2018  . Plantar fasciitis 05/10/2018  . Left knee pain 05/10/2018  . Patellofemoral pain syndrome of both knees 05/10/2018  . Generalized abdominal pain 04/17/2018  . Flank pain  04/17/2018  . Acute back pain 04/17/2018  . History of renal stone 04/17/2018  . Urinary frequency 04/17/2018  . Laceration of left hand 01/31/2018  . Decreased range of motion of finger of left hand 01/31/2018  . Localized swelling on left hand 01/31/2018  . Chronic migraine without aura without status migrainosus, not intractable 11/01/2016  . History of migraine 06/10/2016  . Diarrhea 05/20/2016  . Pain in the chest 05/11/2016  . Dizziness and giddiness 05/11/2016  . History of hypertension 05/11/2016  . Hemorrhoidal skin tag 04/07/2015  . IUD (intrauterine device) in place 05/03/2013  . Allergic rhinitis 01/11/2013  . Current smoker 01/11/2013  . Weight gain 06/26/2012  . Cervical stenosis (uterine cervix) 04/25/2012  . Anticardiolipin antibody positive   . Hyperlipidemia   . Hypertension   . History of  trichomoniasis 01/18/2011    Class: History of  . THYROID NODULE, RIGHT 02/05/2008  . GERD 02/05/2008  . NECK PAIN 02/05/2008    Carney Living PT DPT  01/07/2020, 9:35 PM  [facility] [address] [place], [place], [address] Phone: [phone]   Fax:  [phone]  Name: Kelly [name] MRN: [id number] Date of Birth: May 12, 1966

## 2020-01-09 DIAGNOSIS — L309 Dermatitis, unspecified: Secondary | ICD-10-CM | POA: Diagnosis not present

## 2020-01-14 DIAGNOSIS — Z79891 Long term (current) use of opiate analgesic: Secondary | ICD-10-CM | POA: Diagnosis not present

## 2020-01-14 DIAGNOSIS — K603 Anal fistula: Secondary | ICD-10-CM | POA: Diagnosis not present

## 2020-01-14 DIAGNOSIS — Z79899 Other long term (current) drug therapy: Secondary | ICD-10-CM | POA: Diagnosis not present

## 2020-01-14 DIAGNOSIS — Z800 Family history of malignant neoplasm of digestive organs: Secondary | ICD-10-CM | POA: Diagnosis not present

## 2020-01-14 DIAGNOSIS — K5903 Drug induced constipation: Secondary | ICD-10-CM | POA: Diagnosis not present

## 2020-01-14 DIAGNOSIS — R194 Change in bowel habit: Secondary | ICD-10-CM | POA: Diagnosis not present

## 2020-01-14 DIAGNOSIS — Q761 Klippel-Feil syndrome: Secondary | ICD-10-CM | POA: Diagnosis not present

## 2020-01-14 DIAGNOSIS — Z5181 Encounter for therapeutic drug level monitoring: Secondary | ICD-10-CM | POA: Diagnosis not present

## 2020-01-14 MED FILL — OXYCODONE-ACETAMINOPHEN 5-3: 5-325 | 23 days supply | Qty: 90 | Fill #0

## 2020-01-14 MED FILL — PREGABALIN 100 MG CAPS: 100 | 30 days supply | Qty: 60 | Fill #0

## 2020-01-15 ENCOUNTER — Other Ambulatory Visit: Payer: Self-pay

## 2020-01-15 ENCOUNTER — Ambulatory Visit: Payer: 59 | Admitting: Physical Therapy

## 2020-01-15 DIAGNOSIS — M542 Cervicalgia: Secondary | ICD-10-CM | POA: Diagnosis not present

## 2020-01-15 DIAGNOSIS — M25572 Pain in left ankle and joints of left foot: Secondary | ICD-10-CM | POA: Diagnosis not present

## 2020-01-15 DIAGNOSIS — G8929 Other chronic pain: Secondary | ICD-10-CM | POA: Diagnosis not present

## 2020-01-15 DIAGNOSIS — M25562 Pain in left knee: Secondary | ICD-10-CM | POA: Diagnosis not present

## 2020-01-15 DIAGNOSIS — M62838 Other muscle spasm: Secondary | ICD-10-CM

## 2020-01-15 DIAGNOSIS — M25512 Pain in left shoulder: Secondary | ICD-10-CM | POA: Diagnosis not present

## 2020-01-15 DIAGNOSIS — R600 Localized edema: Secondary | ICD-10-CM | POA: Diagnosis not present

## 2020-01-15 DIAGNOSIS — R2689 Other abnormalities of gait and mobility: Secondary | ICD-10-CM | POA: Diagnosis not present

## 2020-01-15 DIAGNOSIS — M25612 Stiffness of left shoulder, not elsewhere classified: Secondary | ICD-10-CM | POA: Diagnosis not present

## 2020-01-15 NOTE — Patient Instructions (Signed)
DUE TO COVID-19 ONLY ONE VISITOR IS ALLOWED TO COME WITH YOU AND STAY IN THE WAITING ROOM ONLY DURING PRE OP AND PROCEDURE DAY OF SURGERY. THE 1 VISITOR MAY VISIT WITH YOU AFTER SURGERY IN YOUR PRIVATE ROOM DURING VISITING HOURS ONLY!  YOU NEED TO HAVE A COVID 19 TEST ON: 01/18/20 @ 12:30 pm, THIS TEST MUST BE DONE BEFORE SURGERY, COME  [place] , [address].  ([place]) ONCE YOUR COVID TEST IS COMPLETED, PLEASE BEGIN THE QUARANTINE INSTRUCTIONS AS OUTLINED IN YOUR HANDOUT.                [place]    Your procedure is scheduled on: 01/23/20   Report to [facility] Main  Entrance   Report to admitting at: 1:00 PM     Call this number if you have problems the morning of surgery [phone]    Remember: Do not eat solid food  :After Midnight. Clear liquid diet from midnight until 12:00 pm   CLEAR LIQUID DIET   Foods Allowed                                                                     Foods Excluded  Coffee and tea, regular and decaf                             liquids that you cannot  Plain Jell-O any favor except red or purple                                           see through such as: Fruit ices (not with fruit pulp)                                     milk, soups, orange juice  Iced Popsicles                                    All solid food Carbonated beverages, regular and diet                                    Cranberry, grape and apple juices Sports drinks like Gatorade Lightly seasoned clear broth or consume(fat free) Sugar, honey syrup  Sample Menu Breakfast                                Lunch                                     Supper Cranberry juice                    Beef broth  Chicken broth Jell-O                                     Grape juice                           Apple juice Coffee or tea                        Jell-O                                      Popsicle                                                 Coffee or tea                        Coffee or tea  _____________________________________________________________________  BRUSH YOUR TEETH MORNING OF SURGERY AND RINSE YOUR MOUTH OUT, NO CHEWING GUM CANDY OR MINTS.     Take these medicines the morning of surgery with A SIP OF WATER: metoprolol,pregabalin,topamax.                                 You may not have any metal on your body including hair pins and              piercings  Do not wear jewelry, make-up, lotions, powders or perfumes, deodorant             Do not wear nail polish on your fingernails.  Do not shave  48 hours prior to surgery.                Do not bring valuables to the hospital. [place].  Contacts, dentures or bridgework may not be worn into surgery.  Leave suitcase in the car. After surgery it may be brought to your room.     Patients discharged the day of surgery will not be allowed to drive home. IF YOU ARE HAVING SURGERY AND GOING HOME THE SAME DAY, YOU MUST HAVE AN ADULT TO DRIVE YOU HOME AND BE WITH YOU FOR 24 HOURS. YOU MAY GO HOME BY TAXI OR UBER OR ORTHERWISE, BUT AN ADULT MUST ACCOMPANY YOU HOME AND STAY WITH YOU FOR 24 HOURS.  Name and phone number of your driver:  Special Instructions: N/A              Please read over the following fact sheets you were given: _____________________________________________________________________  [facility] - Preparing for Surgery Before surgery, you can play an important role.  Because skin is not sterile, your skin needs to be as free of germs as possible.  You can reduce the number of germs on your skin by washing with CHG (chlorahexidine gluconate) soap before surgery.  CHG is an antiseptic cleaner which kills germs and bonds with the skin to continue killing germs even after washing. Please DO NOT use if you  have an allergy to CHG or antibacterial soaps.  If your skin becomes  reddened/irritated stop using the CHG and inform your nurse when you arrive at Short Stay. Do not shave (including legs and underarms) for at least 48 hours prior to the first CHG shower.  You may shave your face/neck. Please follow these instructions carefully:  1.  Shower with CHG Soap the night before surgery and the  morning of Surgery.  2.  If you choose to wash your hair, wash your hair first as usual with your  normal  shampoo.  3.  After you shampoo, rinse your hair and body thoroughly to remove the  shampoo.                           4.  Use CHG as you would any other liquid soap.  You can apply chg directly  to the skin and wash                       Gently with a scrungie or clean washcloth.  5.  Apply the CHG Soap to your body ONLY FROM THE NECK DOWN.   Do not use on face/ open                           Wound or open sores. Avoid contact with eyes, ears mouth and genitals (private parts).                       Wash face,  Genitals (private parts) with your normal soap.             6.  Wash thoroughly, paying special attention to the area where your surgery  will be performed.  7.  Thoroughly rinse your body with warm water from the neck down.  8.  DO NOT shower/wash with your normal soap after using and rinsing off  the CHG Soap.                9.  Pat yourself dry with a clean towel.            10.  Wear clean pajamas.            11.  Place clean sheets on your bed the night of your first shower and do not  sleep with pets. Day of Surgery : Do not apply any lotions/deodorants the morning of surgery.  Please wear clean clothes to the hospital/surgery center.  FAILURE TO FOLLOW THESE INSTRUCTIONS MAY RESULT IN THE CANCELLATION OF YOUR SURGERY PATIENT SIGNATURE_________________________________  NURSE SIGNATURE__________________________________  ________________________________________________________________________

## 2020-01-16 ENCOUNTER — Encounter (HOSPITAL_COMMUNITY): Payer: Self-pay

## 2020-01-16 ENCOUNTER — Encounter (HOSPITAL_COMMUNITY)
Admission: RE | Admit: 2020-01-16 | Discharge: 2020-01-16 | Disposition: A | Discharge status: Home / Self Care | Payer: 59 | Source: Ambulatory Visit | Attending: General Surgery | Admitting: General Surgery

## 2020-01-16 ENCOUNTER — Encounter: Payer: Self-pay | Admitting: Physical Therapy

## 2020-01-16 ENCOUNTER — Other Ambulatory Visit: Payer: Self-pay

## 2020-01-16 DIAGNOSIS — Z01818 Encounter for other preprocedural examination: Secondary | ICD-10-CM | POA: Insufficient documentation

## 2020-01-16 HISTORY — DX: Depression, unspecified: F32.A

## 2020-01-16 HISTORY — DX: Anemia, unspecified: D64.9

## 2020-01-16 LAB — BASIC METABOLIC PANEL
Anion gap: 9 (ref 5–15)
BUN: 10 mg/dL (ref 6–20)
CO2: 22 mmol/L (ref 22–32)
Calcium: 8.8 mg/dL — ABNORMAL LOW (ref 8.9–10.3)
Chloride: 109 mmol/L (ref 98–111)
Creatinine, Ser: 0.69 mg/dL (ref 0.44–1.00)
GFR calc Af Amer: >60 mL/min (ref >60)
GFR calc non Af Amer: >60 mL/min (ref >60)
Glucose, Bld: 90 mg/dL (ref 70–99)
Potassium: 3.9 mmol/L (ref 3.5–5.1)
Sodium: 140 mmol/L (ref 135–145)

## 2020-01-16 LAB — CBC
HCT: 37.7 % (ref 36.0–46.0)
Hemoglobin: 12.7 g/dL (ref 12.0–15.0)
MCH: 31.6 pg (ref 26.0–34.0)
MCHC: 33.7 g/dL (ref 30.0–36.0)
MCV: 93.8 fL (ref 80.0–100.0)
Platelets: 278 10*3/uL (ref 150–400)
RBC: 4.02 MIL/uL (ref 3.87–5.11)
RDW: 12.2 % (ref 11.5–15.5)
WBC: 9.3 10*3/uL (ref 4.0–10.5)
nRBC: 0 % (ref 0.0–0.2)

## 2020-01-16 NOTE — Therapy (Signed)
[place], [place], [address] Phone: [phone]   Fax:  [phone]  Physical Therapy Treatment/Progress Note  Patient Details  Name: Kelly [name] MRN: [id number] Date of Birth: July 24, 1966 Referring Provider (PT): Dr Sherley Bounds    Encounter Date: 01/15/2020  PT End of Session - 01/15/20 1339    Visit Number  12    Number of Visits  13    Date for PT Re-Evaluation  02/26/2020   Authorization Type  Medicaid Re-eval next visit    PT Start Time  1330    PT Stop Time  1427    PT Time Calculation (min)  57 min    Activity Tolerance  Patient tolerated treatment well    Behavior During Therapy  [facility] for tasks assessed/performed       Past Medical History:  Diagnosis Date  . Anal fistula    intersphincteric   . Anticardiolipin antibody positive   . Cervicalgia    followed by dr Sherley Bounds  . History of herpes genitalis    many yrs ago  . History of kidney stones   . Hyperlipidemia   . Hypertension    followed by pcp   (11-25-2019  per pt had stress test approx. 2018 w/ Dr Wynonia Lawman, told normal)  . Migraines    neurologist--- dr Jaynee Eagles  . OSA on CPAP   . TMJ (dislocation of temporomandibular joint)    11-25-2019  jaw pops, no appliance  . Wears glasses     Past Surgical History:  Procedure Laterality Date  . ANTERIOR CERVICAL DECOMP/DISCECTOMY FUSION  12/ 2020   [facility]   C4 - 6  . CESAREAN SECTION     X3   LAST ONE 11-30-2004  . CYSTOSCOPY W/ RETROGRADES Right 10/29/2012   Procedure: CYSTOSCOPY WITH RETROGRADE PYELOGRAM and stent placement;  Surgeon: Reece Packer, MD;  Location: [facility];  Service: Urology;  Laterality: Right;  rt stent placement , rt retrograde and cysto   . CYSTOSCOPY W/ URETERAL STENT PLACEMENT Right 11/23/2012   Procedure: CYSTOSCOPY WITH STENT REPLACEMENT;  Surgeon: Alexis Frock, MD;  Location: [facility];  Service:  Urology;  Laterality: Right;  . CYSTOSCOPY/RETROGRADE/URETEROSCOPY/STONE EXTRACTION WITH BASKET Right 11/23/2012   Procedure: CYSTOSCOPY/RETROGRADE/URETEROSCOPY/STONE EXTRACTION WITH BASKET;  Surgeon: Alexis Frock, MD;  Location: [facility];  Service: Urology;  Laterality: Right;    There were no vitals filed for this visit.      [facility] PT Assessment - 01/16/20 0001      AROM   Right Shoulder Flexion  160 Degrees    Left Shoulder Flexion  145 Degrees    Cervical Flexion  20    Cervical Extension  16    Cervical - Right Rotation  55    Cervical - Left Rotation  53      Strength   Right Shoulder Flexion  4+/5    Right Shoulder Internal Rotation  4+/5    Right Shoulder External Rotation  4+/5    Left Shoulder Flexion  4/5    Left Shoulder Internal Rotation  4/5    Left Shoulder External Rotation  4/5      Palpation   Palpation comment  Patient continues to have spasming in her upper traps                     [facility] Adult PT Treatment/Exercise - 01/16/20 1426  Neck Exercises: Machines for Strengthening   UBE (Upper Arm Bike)  2 mins posterior     Other Machines for Strengthening  shoulder pulley x2 min       Neck Exercises: Standing   Other Standing Exercises  scap retraction x20 green; shoulder extension x20 green ; wall wash x10 up down x10 ccw cw each direction     Other Standing Exercises  standing wand flexion 2x10 2lb ; wall walk 2x10       Neck Exercises: Seated   Other Seated Exercise  bilateral er 2x10 green  howirzontal abduction 2x10 red      Neck Exercises: Supine   Other Supine Exercise  supine wand flexion no weight       Lumbar Exercises: Aerobic   UBE (Upper Arm Bike)  2 min posterior     Other Aerobic Exercise  pulleys 2 min       Lumbar Exercises: Standing   Other Standing Lumbar Exercises  scap retraction cx20 green shoulder extension x20 green       Electrical Stimulation   Electrical Stimulation Location  cervical  spine     Electrical Stimulation Action  IFC    Electrical Stimulation Parameters  to tolerance     Electrical Stimulation Goals  Pain      Manual Therapy   Manual Therapy  Passive ROM    Soft tissue mobilization  trigger point release to cervical parapinals, upper trap ,    Manual Traction  sub-occipital release                PT Short Term Goals - 01/16/20 1433      PT SHORT TERM GOAL #1   Title  Patient will be indepdent with relaxation and breathing techniques to reduce actue inflamtion.    Baseline  independent    Period  Weeks    Status  Achieved    Target Date  11/08/19      PT SHORT TERM GOAL #2   Title  Patient will report no radiating pain down both of her amrs    Baseline  continues to have nbumbness, pain has improved    Time  3    Period  Weeks    Status  On-going    Target Date  02/06/20      PT SHORT TERM GOAL #3   Title  Patient will increase gross bilateral UE strength to 4+/5    Baseline  right 4+/5 left still 4/5    Time  3    Period  Weeks    Status  Partially Met    Target Date  02/06/20        PT Long Term Goals - 01/16/20 1434      PT LONG TERM GOAL #1   Title  Patient will demonstrate 60 degrees of passive rotation ROM bilateral without pain    Baseline  55 right 52 left    Time  6    Period  Weeks    Status  On-going    Target Date  02/27/20      PT LONG TERM GOAL #2   Title  Patient will sit at her desk for 4 hours without pain    Baseline  up to two hours with increased pain    Time  6    Period  Weeks    Status  On-going    Target Date  02/27/20      PT LONG TERM GOAL #3  Title  Patient will reach overhead to a cabinet without pain    Baseline  left arm goes numb. Can perfrom with left    Time  6    Period  Weeks    Status  Partially Met    Target Date  02/27/20            Plan - 01/16/20 1429    Clinical Impression Statement  Patient is making some progress. she continues to have tingling in her hand with  activity but she has been able to cook and perfrom daily activity without as much pain. At this time she can sit at a computer for about 2 hours. sh eneeds to be able to sit at work for 8 hours and do work. Her right UE strength has improved to 4+/5 gross. her left UE is still weak and she is lacking control with activity over 90 degrees of flexion. She ihas been perfroming progressive exercises. She would benefit from further skilled therapy 1W6 to continue to progress functional strengthening for her return to work. She will have a unrelated procudre and will likley be out 2 weeks.    Personal Factors and Comorbidities  Comorbidity 1;Comorbidity 2;Profession    Examination-Activity Limitations  Lift;Reach Overhead;Locomotion Level    Examination-Participation Restrictions  Church;Cleaning;Community Activity;Laundry;Meal Prep    Stability/Clinical Decision Making  Evolving/Moderate complexity    Clinical Decision Making  Moderate    Rehab Potential  Good    PT Frequency  2x / week    PT Duration  6 weeks    PT Treatment/Interventions  ADLs/Self Care Home Management;Cryotherapy;Electrical Stimulation;Iontophoresis '4mg'$ /ml Dexamethasone;Moist Heat;Ultrasound;Traction;DME Instruction;Gait training;Stair training;Functional mobility training;Therapeutic activities;Therapeutic exercise;Neuromuscular re-education;Patient/family education;Manual techniques;Passive range of motion;Dry needling;Taping    PT Next Visit Plan  re-eval for mediciad    PT Home Exercise Plan  t-band extensin , scpation , bilateral er,    Consulted and Agree with Plan of Care  Patient       Patient will benefit from skilled therapeutic intervention in order to improve the following deficits and impairments:  Pain, Increased muscle spasms, Decreased activity tolerance, Decreased strength, Decreased range of motion, Decreased endurance, Increased fascial restricitons  Visit Diagnosis: Cervicalgia  Other muscle  spasm     Problem List Patient Active Problem List   Diagnosis Date Noted  . Stressful life event affecting family 07/12/2019  . Caregiver burden 07/12/2019  . Tobacco abuse counseling 07/12/2019  . Protrusion of cervical intervertebral disc 04/10/2019  . Spinal stenosis of cervical region 04/10/2019  . Overactive bladder 11/19/2018  . Photosensitization due to sun 11/02/2018  . Generalized pain 11/02/2018  . High risk medication use 11/02/2018  . Foot pain, bilateral 05/10/2018  . Heel pain, bilateral 05/10/2018  . Plantar fasciitis 05/10/2018  . Left knee pain 05/10/2018  . Patellofemoral pain syndrome of both knees 05/10/2018  . Generalized abdominal pain 04/17/2018  . Flank pain 04/17/2018  . Acute back pain 04/17/2018  . History of renal stone 04/17/2018  . Urinary frequency 04/17/2018  . Laceration of left hand 01/31/2018  . Decreased range of motion of finger of left hand 01/31/2018  . Localized swelling on left hand 01/31/2018  . Chronic migraine without aura without status migrainosus, not intractable 11/01/2016  . History of migraine 06/10/2016  . Diarrhea 05/20/2016  . Pain in the chest 05/11/2016  . Dizziness and giddiness 05/11/2016  . History of hypertension 05/11/2016  . Hemorrhoidal skin tag 04/07/2015  . IUD (intrauterine device) in place 05/03/2013  .  Allergic rhinitis 01/11/2013  . Current smoker 01/11/2013  . Weight gain 06/26/2012  . Cervical stenosis (uterine cervix) 04/25/2012  . Anticardiolipin antibody positive   . Hyperlipidemia   . Hypertension   . History of trichomoniasis 01/18/2011    Class: History of  . THYROID NODULE, RIGHT 02/05/2008  . GERD 02/05/2008  . NECK PAIN 02/05/2008    Carney Living 01/16/2020, 2:36 PM  [facility] [address] [place], [place], [address] Phone: [phone]   Fax:  [phone]  Name: Kelly [name] MRN: [id number] Date of Birth: 04/21/1966

## 2020-01-16 NOTE — Addendum Note (Signed)
Addended by: Carney Living on: 01/16/2020 02:39 PM   Modules accepted: Orders

## 2020-01-17 NOTE — Progress Notes (Signed)
LVM for pt not show for her covid on Sat 5/29, as she was scheduled too early for her procedure on Thurs 6/3. Pt's appt moved to Tues 6/1 at 12:30pm. Pt may arrive earlier, as we are there from 8a-3:15p M-F.

## 2020-01-18 ENCOUNTER — Other Ambulatory Visit (HOSPITAL_COMMUNITY): Payer: 59

## 2020-01-21 ENCOUNTER — Other Ambulatory Visit (HOSPITAL_COMMUNITY)
Admission: RE | Admit: 2020-01-21 | Discharge: 2020-01-21 | Disposition: A | Discharge status: Home / Self Care | Payer: 59 | Source: Ambulatory Visit | Attending: General Surgery | Admitting: General Surgery

## 2020-01-21 DIAGNOSIS — Z01812 Encounter for preprocedural laboratory examination: Secondary | ICD-10-CM | POA: Insufficient documentation

## 2020-01-21 DIAGNOSIS — Z20822 Contact with and (suspected) exposure to covid-19: Secondary | ICD-10-CM | POA: Diagnosis not present

## 2020-01-22 LAB — SARS CORONAVIRUS 2 (TAT 6-24 HRS): SARS Coronavirus 2: NEGATIVE

## 2020-01-23 ENCOUNTER — Ambulatory Visit (HOSPITAL_COMMUNITY)
Admission: RE | Admit: 2020-01-23 | Discharge: 2020-01-23 | Disposition: A | Discharge status: Home / Self Care | Payer: 59 | Attending: General Surgery | Admitting: General Surgery

## 2020-01-23 ENCOUNTER — Encounter (HOSPITAL_COMMUNITY): Payer: Self-pay | Admitting: General Surgery

## 2020-01-23 ENCOUNTER — Ambulatory Visit (HOSPITAL_COMMUNITY): Payer: 59

## 2020-01-23 ENCOUNTER — Encounter (HOSPITAL_COMMUNITY)
Admission: RE | Disposition: A | Discharge status: Home / Self Care | Payer: Self-pay | Source: Home / Self Care | Attending: General Surgery

## 2020-01-23 ENCOUNTER — Telehealth (HOSPITAL_COMMUNITY): Payer: Self-pay | Admitting: *Deleted

## 2020-01-23 DIAGNOSIS — F17200 Nicotine dependence, unspecified, uncomplicated: Secondary | ICD-10-CM | POA: Insufficient documentation

## 2020-01-23 DIAGNOSIS — L918 Other hypertrophic disorders of the skin: Secondary | ICD-10-CM | POA: Diagnosis not present

## 2020-01-23 DIAGNOSIS — Z86010 Personal history of colonic polyps: Secondary | ICD-10-CM | POA: Insufficient documentation

## 2020-01-23 DIAGNOSIS — K644 Residual hemorrhoidal skin tags: Secondary | ICD-10-CM | POA: Diagnosis not present

## 2020-01-23 DIAGNOSIS — G4730 Sleep apnea, unspecified: Secondary | ICD-10-CM | POA: Diagnosis not present

## 2020-01-23 DIAGNOSIS — E7800 Pure hypercholesterolemia, unspecified: Secondary | ICD-10-CM | POA: Diagnosis not present

## 2020-01-23 DIAGNOSIS — K603 Anal fistula: Secondary | ICD-10-CM | POA: Diagnosis not present

## 2020-01-23 DIAGNOSIS — G43909 Migraine, unspecified, not intractable, without status migrainosus: Secondary | ICD-10-CM | POA: Insufficient documentation

## 2020-01-23 DIAGNOSIS — Z881 Allergy status to other antibiotic agents status: Secondary | ICD-10-CM | POA: Diagnosis not present

## 2020-01-23 DIAGNOSIS — Z888 Allergy status to other drugs, medicaments and biological substances status: Secondary | ICD-10-CM | POA: Diagnosis not present

## 2020-01-23 DIAGNOSIS — K5140 Inflammatory polyps of colon without complications: Secondary | ICD-10-CM | POA: Diagnosis not present

## 2020-01-23 DIAGNOSIS — Z885 Allergy status to narcotic agent status: Secondary | ICD-10-CM | POA: Diagnosis not present

## 2020-01-23 DIAGNOSIS — I10 Essential (primary) hypertension: Secondary | ICD-10-CM | POA: Diagnosis not present

## 2020-01-23 HISTORY — PX: EVALUATION UNDER ANESTHESIA WITH ANAL FISSUROTOMY: SHX5622

## 2020-01-23 LAB — PREGNANCY, URINE: Preg Test, Ur: NEGATIVE

## 2020-01-23 SURGERY — EXAM UNDER ANESTHESIA WITH ANAL FISSUROTOMY
Anesthesia: Monitor Anesthesia Care

## 2020-01-23 MED ORDER — ONDANSETRON HCL 4 MG/2ML IJ SOLN
INTRAMUSCULAR | Status: AC
  Filled 2020-01-23: qty 2

## 2020-01-23 MED ORDER — CHLORHEXIDINE GLUCONATE 0.12 % MT SOLN
15.0000 mL | Freq: Once | OROMUCOSAL | Status: AC
  Administered 2020-01-23: 15 mL via OROMUCOSAL

## 2020-01-23 MED ORDER — BUPIVACAINE-EPINEPHRINE 0.5% -1:200000 IJ SOLN
INTRAMUSCULAR | Status: DC | PRN
  Administered 2020-01-23: 30 mL

## 2020-01-23 MED ORDER — MEPERIDINE HCL 50 MG/ML IJ SOLN: 6.2500 mg | INTRAMUSCULAR | Status: DC | PRN

## 2020-01-23 MED ORDER — HYDROMORPHONE HCL 1 MG/ML IJ SOLN: 0.2500 mg | INTRAMUSCULAR | Status: DC | PRN

## 2020-01-23 MED ORDER — ACETAMINOPHEN 500 MG PO TABS
1000.0000 mg | ORAL_TABLET | ORAL | Status: AC
  Administered 2020-01-23: 1000 mg via ORAL
  Filled 2020-01-23: qty 2

## 2020-01-23 MED ORDER — ONDANSETRON HCL 4 MG/2ML IJ SOLN
INTRAMUSCULAR | Status: DC | PRN
  Administered 2020-01-23: 4 mg via INTRAVENOUS

## 2020-01-23 MED ORDER — PROPOFOL 10 MG/ML IV BOLUS
INTRAVENOUS | Status: DC | PRN
  Administered 2020-01-23: 10 mg via INTRAVENOUS

## 2020-01-23 MED ORDER — BUPIVACAINE-EPINEPHRINE 0.5% -1:200000 IJ SOLN
INTRAMUSCULAR | Status: AC
  Filled 2020-01-23: qty 1

## 2020-01-23 MED ORDER — SODIUM CHLORIDE 0.9% FLUSH: 3.0000 mL | Freq: Two times a day (BID) | INTRAVENOUS | Status: DC

## 2020-01-23 MED ORDER — OXYCODONE HCL 5 MG PO TABS: 5.0000 mg | ORAL_TABLET | ORAL | Status: DC | PRN

## 2020-01-23 MED ORDER — FENTANYL CITRATE (PF) 100 MCG/2ML IJ SOLN
INTRAMUSCULAR | Status: DC | PRN
  Administered 2020-01-23: 50 ug via INTRAVENOUS

## 2020-01-23 MED ORDER — ACETAMINOPHEN 650 MG RE SUPP
650.0000 mg | RECTAL | Status: DC | PRN
  Filled 2020-01-23: qty 1

## 2020-01-23 MED ORDER — FENTANYL CITRATE (PF) 100 MCG/2ML IJ SOLN
INTRAMUSCULAR | Status: AC
  Filled 2020-01-23: qty 2

## 2020-01-23 MED ORDER — ACETAMINOPHEN 325 MG PO TABS: 650.0000 mg | ORAL_TABLET | ORAL | Status: DC | PRN

## 2020-01-23 MED ORDER — MIDAZOLAM HCL 2 MG/2ML IJ SOLN
INTRAMUSCULAR | Status: AC
  Filled 2020-01-23: qty 2

## 2020-01-23 MED ORDER — SODIUM CHLORIDE 0.9 % IV SOLN: 250.0000 mL | INTRAVENOUS | Status: DC | PRN

## 2020-01-23 MED ORDER — PROPOFOL 500 MG/50ML IV EMUL
INTRAVENOUS | Status: DC | PRN
  Administered 2020-01-23: 75 ug/kg/min via INTRAVENOUS

## 2020-01-23 MED ORDER — PROPOFOL 500 MG/50ML IV EMUL
INTRAVENOUS | Status: AC
  Filled 2020-01-23: qty 50

## 2020-01-23 MED ORDER — SODIUM CHLORIDE 0.9% FLUSH: 3.0000 mL | INTRAVENOUS | Status: DC | PRN

## 2020-01-23 MED ORDER — PROPOFOL 10 MG/ML IV BOLUS
INTRAVENOUS | Status: AC
  Filled 2020-01-23: qty 20

## 2020-01-23 MED ORDER — MIDAZOLAM HCL 5 MG/5ML IJ SOLN
INTRAMUSCULAR | Status: DC | PRN
  Administered 2020-01-23: 2 mg via INTRAVENOUS

## 2020-01-23 MED ORDER — LACTATED RINGERS IV SOLN: INTRAVENOUS | Status: DC

## 2020-01-23 MED ORDER — ONDANSETRON HCL 4 MG/2ML IJ SOLN: 4.0000 mg | Freq: Once | INTRAMUSCULAR | Status: DC | PRN

## 2020-01-23 MED ORDER — ORAL CARE MOUTH RINSE: 15.0000 mL | Freq: Once | OROMUCOSAL | Status: AC

## 2020-01-23 SURGICAL SUPPLY — 47 items
APL SKNCLS STERI-STRIP NONHPOA (GAUZE/BANDAGES/DRESSINGS) ×2
BENZOIN TINCTURE PRP APPL 2/3 (GAUZE/BANDAGES/DRESSINGS) ×4 IMPLANT
BLADE EXTENDED COATED 6.5IN (ELECTRODE) IMPLANT
BLADE HEX COATED 2.75 (ELECTRODE) ×2 IMPLANT
BLADE SURG SZ10 CARB STEEL (BLADE) ×2 IMPLANT
BRIEF STRETCH FOR OB PAD LRG (UNDERPADS AND DIAPERS) ×2 IMPLANT
COVER BACK TABLE 60X90IN (DRAPES) ×2 IMPLANT
COVER MAYO STAND STRL (DRAPES) ×2 IMPLANT
COVER WAND RF STERILE (DRAPES) ×2 IMPLANT
DECANTER SPIKE VIAL GLASS SM (MISCELLANEOUS) ×2 IMPLANT
DRAPE LAPAROTOMY T 98X78 PEDS (DRAPES) ×2 IMPLANT
DRAPE UTILITY XL STRL (DRAPES) ×2 IMPLANT
DRSG PAD ABDOMINAL 8X10 ST (GAUZE/BANDAGES/DRESSINGS) ×2 IMPLANT
ELECT REM PT RETURN 15FT ADLT (MISCELLANEOUS) ×2 IMPLANT
GAUZE SPONGE 4X4 12PLY STRL (GAUZE/BANDAGES/DRESSINGS) ×2 IMPLANT
GLOVE BIO SURGEON STRL SZ 6.5 (GLOVE) ×6 IMPLANT
GLOVE BIOGEL PI IND STRL 7.0 (GLOVE) ×1 IMPLANT
GLOVE BIOGEL PI INDICATOR 7.0 (GLOVE) ×1
GOWN STRL REUS W/TWL XL LVL3 (GOWN DISPOSABLE) ×2 IMPLANT
HYDROGEN PEROXIDE 16OZ (MISCELLANEOUS) ×2 IMPLANT
IV CATH 14GX2 1/4 (CATHETERS) ×2 IMPLANT
KIT BASIN (CUSTOM PROCEDURE TRAY) ×2 IMPLANT
KIT BASIN OR (CUSTOM PROCEDURE TRAY) ×2 IMPLANT
KIT SIGMOIDOSCOPE (SET/KITS/TRAYS/PACK) IMPLANT
KIT TURNOVER KIT A (KITS) ×2 IMPLANT
LOOP VESSEL MAXI BLUE (MISCELLANEOUS) ×2 IMPLANT
NEEDLE HYPO 22GX1.5 SAFETY (NEEDLE) ×2 IMPLANT
NS IRRIG 1000ML POUR BTL (IV SOLUTION) ×2 IMPLANT
PAD ARMBOARD 7.5X6 YLW CONV (MISCELLANEOUS) IMPLANT
PENCIL SMOKE EVACUATOR (MISCELLANEOUS) ×2 IMPLANT
SPONGE HEMORRHOID 8X3CM (HEMOSTASIS) IMPLANT
SPONGE SURGIFOAM ABS GEL 12-7 (HEMOSTASIS) IMPLANT
SUCTION FRAZIER HANDLE 10FR (MISCELLANEOUS)
SUCTION TUBE FRAZIER 10FR DISP (MISCELLANEOUS) IMPLANT
SUT CHROMIC 2 0 SH (SUTURE) ×1 IMPLANT
SUT CHROMIC 3 0 SH 27 (SUTURE) ×1 IMPLANT
SUT ETHIBOND 0 (SUTURE) IMPLANT
SUT VIC AB 2-0 SH 27 (SUTURE)
SUT VIC AB 2-0 SH 27XBRD (SUTURE) IMPLANT
SUT VIC AB 3-0 SH 18 (SUTURE) IMPLANT
SUT VIC AB 3-0 SH 27 (SUTURE)
SUT VIC AB 3-0 SH 27XBRD (SUTURE) IMPLANT
SYR CONTROL 10ML LL (SYRINGE) ×2 IMPLANT
TOWEL OR 17X26 10 PK STRL BLUE (TOWEL DISPOSABLE) ×2 IMPLANT
TRAY PREP A LATEX SAFE STRL (SET/KITS/TRAYS/PACK) ×2 IMPLANT
TUBING CONNECTING 10 (TUBING) ×2 IMPLANT
YANKAUER SUCT BULB TIP NO VENT (SUCTIONS) ×2 IMPLANT

## 2020-01-23 NOTE — Anesthesia Postprocedure Evaluation (Signed)
Anesthesia Post Note  Patient: Kelly [name]  Procedure(s) Performed: ANAL EXAM UNDER ANESTHESIA, FISTULOTOMY, SKIN TAG EXCISION (N/A )     Patient location during evaluation: PACU Anesthesia Type: MAC Level of consciousness: awake and alert Pain management: pain level controlled Vital Signs Assessment: post-procedure vital signs reviewed and stable Respiratory status: spontaneous breathing, nonlabored ventilation, respiratory function stable and patient connected to nasal cannula oxygen Cardiovascular status: stable and blood pressure returned to baseline Postop Assessment: no apparent nausea or vomiting Anesthetic complications: no    Last Vitals:  Vitals:   01/23/20 1600 01/23/20 1625  BP: 102/66 125/83  Pulse: 75 77  Resp: 19 12  Temp:  (!) 36.3 C  SpO2: 99% 100%    Last Pain:  Vitals:   01/23/20 1625  TempSrc:   PainSc: 0-No pain                 Andrej Spagnoli DAVID

## 2020-01-23 NOTE — Op Note (Signed)
01/23/2020  3:18 PM  PATIENT:  Kelly [name]  54 y.o. female  Patient Care Team: Tysinger, Camelia Eng, PA-C as PCP - General (Family Medicine) Eustace Moore, MD as Consulting Physician (Neurosurgery)  PRE-OPERATIVE DIAGNOSIS:  ANAL FISTULA, SKIN TAG   POST-OPERATIVE DIAGNOSIS:  skin tag, fistula  PROCEDURE:  ANAL EXAM UNDER ANESTHESIA, FISTULOTOMY, SKIN TAG EXCISION   Surgeon(s): Leighton Ruff, MD  ASSISTANT: none   ANESTHESIA:   local and MAC  SPECIMEN:  Source of Specimen:  anal skin tag  DISPOSITION OF SPECIMEN:  PATHOLOGY  COUNTS:  YES  PLAN OF CARE: Discharge to home after PACU  PATIENT DISPOSITION:  PACU - hemodynamically stable.  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  She also has some bothersome skin tags anteriorly that she would like removed.   OR FINDINGS: Right posterior intersphincteric fistula involving less than 5% of the sphincter complex.  It appears that this is due to a previous anal fissure.  DESCRIPTION: the patient was identified in the preoperative holding area and taken to the OR where they were laid on the operating room table.  MAC anesthesia was induced without difficulty. The patient was then positioned in prone jackknife position with buttocks gently taped apart.  The patient was then prepped and draped in usual sterile fashion.  SCDs were noted to be in place prior to the initiation of anesthesia. A surgical timeout was performed indicating the correct patient, procedure, positioning and need for preoperative antibiotics.  A rectal block was performed using Marcaine with epinephrine.    I began with a digital rectal exam.  Patient sphincter tone was good.  There were no masses noted.  I then placed a Hill-Ferguson anoscope into the anal canal and evaluated this completely.  There was minimal internal hemorrhoid disease.  A fistula probe was  inserted into the external opening in the right posterior perianal space.  This was traversed into the anal canal.  It appeared to be involving a small percentage of the internal anal sphincter (less than 5%).  I performed a fistulotomy using Bovie electrocautery.  The edges were marsupialized using a running 2-0 chromic suture.  Hemostasis was good.  The patient was noted to have a hypertrophied anal papilla at posterior midline.  This was excised and closed with an interrupted 2-0 chromic suture.  I then turned my attention to the anterior anal canal.  The skin tags were excised using Metzenbaum scissors.  The incision was closed using a running 3-0 chromic suture.

## 2020-01-23 NOTE — Anesthesia Preprocedure Evaluation (Signed)
Anesthesia Evaluation  Patient identified by MRN, date of birth, ID band Patient awake    Reviewed: Allergy & Precautions, NPO status , Patient's Chart, lab work & pertinent test results  Airway Mallampati: I  TM Distance: >3 FB Neck ROM: Full    Dental   Pulmonary sleep apnea and Continuous Positive Airway Pressure Ventilation , Current Smoker,    Pulmonary exam normal        Cardiovascular hypertension, Pt. on medications Normal cardiovascular exam     Neuro/Psych Depression    GI/Hepatic GERD  Medicated and Controlled,  Endo/Other    Renal/GU      Musculoskeletal   Abdominal   Peds  Hematology   Anesthesia Other Findings   Reproductive/Obstetrics                             Anesthesia Physical Anesthesia Plan  ASA: II  Anesthesia Plan: MAC   Post-op Pain Management:    Induction: Intravenous  PONV Risk Score and Plan: 1 and Treatment may vary due to age or medical condition  Airway Management Planned: Nasal Cannula  Additional Equipment:   Intra-op Plan:   Post-operative Plan:   Informed Consent: I have reviewed the patients History and Physical, chart, labs and discussed the procedure including the risks, benefits and alternatives for the proposed anesthesia with the patient or authorized representative who has indicated his/her understanding and acceptance.       Plan Discussed with: CRNA and Surgeon  Anesthesia Plan Comments:         Anesthesia Quick Evaluation

## 2020-01-23 NOTE — Discharge Instructions (Addendum)
ANORECTAL SURGERY: POST OP INSTRUCTIONS °1. Take your usually prescribed home medications unless otherwise directed. °2. DIET: During the first few hours after surgery sip on some liquids until you are able to urinate.  It is normal to not urinate for several hours after this surgery.  If you feel uncomfortable, please contact the office for instructions.  After you are able to urinate,you may eat, if you feel like it.  Follow a light bland diet the first 24 hours after arrival home, such as soup, liquids, crackers, etc.  Be sure to include lots of fluids daily (6-8 glasses).  Avoid fast food or heavy meals, as your are more likely to get nauseated.  Eat a low fat diet the next few days after surgery.  Limit caffeine intake to 1-2 servings a day. °3. PAIN CONTROL: °a. Pain is best controlled by a usual combination of several different methods TOGETHER: °i. Muscle relaxation: Soak in a warm bath (or Sitz bath) three times a day and after bowel movements.  Continue to do this until all pain is resolved. °ii. Over the counter pain medication °iii. Prescription pain medication °b. Most patients will experience some swelling and discomfort in the anus/rectal area and incisions.  Heat such as warm towels, sitz baths, warm baths, etc to help relax tight/sore spots and speed recovery.  Some people prefer to use ice, especially in the first couple days after surgery, as it may decrease the pain and swelling, or alternate between ice & heat.  Experiment to what works for you.  Swelling and bruising can take several weeks to resolve.  Pain can take even longer to completely resolve. °c. It is helpful to take an over-the-counter pain medication regularly for the first few weeks.  Choose one of the following that works best for you: °i. Naproxen (Aleve, etc)  Two 220mg tabs twice a day °ii. Ibuprofen (Advil, etc) Three 200mg tabs four times a day (every meal & bedtime) °d. A  prescription for pain medication (such as percocet,  oxycodone, hydrocodone, etc) should be given to you upon discharge.  Take your pain medication as prescribed.  °i. If you are having problems/concerns with the prescription medicine (does not control pain, nausea, vomiting, rash, itching, etc), please call us [phone] to see if we need to switch you to a different pain medicine that will work better for you and/or control your side effect better. °ii. If you need a refill on your pain medication, please contact your pharmacy.  They will contact our office to request authorization. Prescriptions will not be filled after 5 pm or on week-ends. °4. KEEP YOUR BOWELS REGULAR and AVOID CONSTIPATION °a. The goal is one to two soft bowel movements a day.  You should at least have a bowel movement every other day. °b. Avoid getting constipated.  Between the surgery and the pain medications, it is common to experience some constipation. This can be very painful after rectal surgery.  Increasing fluid intake and taking a fiber supplement (such as Metamucil, Citrucel, FiberCon, etc) 1-2 times a day regularly will usually help prevent this problem from occurring.  A stool softener like colace is also recommended.  This can be purchased over the counter at your pharmacy.  You can take it up to 3 times a day.  If you do not have a bowel movement after 24 hrs since your surgery, take one does of milk of magnesia.  If you still haven't had a bowel movement 8-12 hours after   that dose, take another dose.  If you don't have a bowel movement 48 hrs after surgery, purchase a Fleets enema from the drug store and administer gently per package instructions.  If you still are having trouble with your bowel movements after that, please call the office for further instructions. °c. If you develop diarrhea or have many loose bowel movements, simplify your diet to bland foods & liquids for a few days.  Stop any stool softeners and decrease your fiber supplement.  Switching to mild  anti-diarrheal medications (Kayopectate, Pepto Bismol) can help.  If this worsens or does not improve, please call us. ° °5. Wound Care °a. Remove your bandages before your first bowel movement or 8 hours after surgery.     °b. Remove any wound packing material at this tim,e as well.  You do not need to repack the wound unless instructed otherwise.  Wear an absorbent pad or soft cotton gauze in your underwear to catch any drainage and help keep the area clean. You should change this every 2-3 hours while awake. °c. Keep the area clean and dry.  Bathe / shower every day, especially after bowel movements.  Keep the area clean by showering / bathing over the incision / wound.   It is okay to soak an open wound to help wash it.  Wet wipes or showers / gentle washing after bowel movements is often less traumatic than regular toilet paper. °d. You may have some styrofoam-like soft packing in the rectum which will come out with the first bowel movement.  °e. You will often notice bleeding with bowel movements.  This should slow down by the end of the first week of surgery °f. Expect some drainage.  This should slow down, too, by the end of the first week of surgery.  Wear an absorbent pad or soft cotton gauze in your underwear until the drainage stops. °g. Do Not sit on a rubber or pillow ring.  This can make you symptoms worse.  You may sit on a soft pillow if needed.  °6. ACTIVITIES as tolerated:   °a. You may resume regular (light) daily activities beginning the next day--such as daily self-care, walking, climbing stairs--gradually increasing activities as tolerated.  If you can walk 30 minutes without difficulty, it is safe to try more intense activity such as jogging, treadmill, bicycling, low-impact aerobics, swimming, etc. °b. Save the most intensive and strenuous activity for last such as sit-ups, heavy lifting, contact sports, etc  Refrain from any heavy lifting or straining until you are off narcotics for pain  control.   °c. You may drive when you are no longer taking prescription pain medication, you can comfortably sit for long periods of time, and you can safely maneuver your car and apply brakes. °d. You may have sexual intercourse when it is comfortable.  °7. FOLLOW UP in our office °a. Please call [facility] at [phone] to set up an appointment to see your surgeon in the office for a follow-up appointment approximately 3-4 weeks after your surgery. °b. Make sure that you call for this appointment the day you arrive home to insure a convenient appointment time. °10. IF YOU HAVE DISABILITY OR FAMILY LEAVE FORMS, BRING THEM TO THE OFFICE FOR PROCESSING.  DO NOT GIVE THEM TO YOUR DOCTOR. ° ° ° ° °WHEN TO CALL US [phone]: °1. Poor pain control °2. Reactions / problems with new medications (rash/itching, nausea, etc)  °3. Fever over 101.5 F (38.5 C) °4.   Inability to urinate °5. Nausea and/or vomiting °6. Worsening swelling or bruising °7. Continued bleeding from incision. °8. Increased pain, redness, or drainage from the incision ° °The clinic staff is available to answer your questions during regular business hours (8:30am-5pm).  Please don’t hesitate to call and ask to speak to one of our nurses for clinical concerns.   A surgeon from [facility] is always on call at the hospitals °  °If you have a medical emergency, go to the nearest emergency room or call 911. °  ° °[facility] °[address], [address], [place], [place]  [address] ? °MAIN: [phone] ? TOLL FREE: [phone] ? °FAX [phone] °[email or web address] ° ° ° °

## 2020-01-23 NOTE — H&P (Signed)
The patient is a 54 year old female who presents with anal fistula. 55 year old female who presents to the office with complaints of a perianal abscess that started in January. She has had ongoing issues with this recurring over the past few months. She has been having some chronic diarrhea. She underwent a colonoscopy. She then underwent an MRI which showed a posterior intersphincteric abscess. She is here today to discuss surgical options. She complains of some external hemorrhoid symptoms as well.   Past Surgical History Sabino Gasser, CMA; 11/18/2019 1:45 PM) Cesarean Section - Multiple  Colon Polyp Removal - Colonoscopy  Oral Surgery  Spinal Surgery - Neck   Diagnostic Studies History Sabino Gasser, CMA; 11/18/2019 1:45 PM) Colonoscopy  within last year Mammogram  within last year Pap Smear  1-5 years ago  Allergies Sabino Gasser, CMA; 11/18/2019 1:47 PM) Serotonin HCl *CHEMICALS*  Zomig *MIGRAINE PRODUCTS*  Avelox *FLUOROQUINOLONES*  Morphine Sulfate (Concentrate) *ANALGESICS - OPIOID*  Bactrim *ANTI-INFECTIVE AGENTS - MISC.*  Allergies Reconciled   Medication History Sabino Gasser, CMA; 11/18/2019 1:48 PM) Topiramate (50MG  Tablet, Oral) Active. Metoprolol Tartrate (50MG  Tablet, Oral) Active. traMADol HCl (50MG  Tablet, Oral) Active. Cyclobenzaprine HCl (5MG  Tablet, Oral) Active. Lyrica (25MG  Capsule, Oral) Active. Medications Reconciled  Social History Sabino Gasser, CMA; 11/18/2019 1:45 PM) Alcohol use  Occasional alcohol use. Caffeine use  Carbonated beverages, Tea. No drug use  Tobacco use  Current every day smoker.  Family History Sabino Gasser, [place]; 11/18/2019 1:45 PM) Alcohol Abuse  Brother. Cerebrovascular Accident  Son. Colon Cancer  Father. Depression  Daughter, Sister, Son. Diabetes Mellitus  Father, Sister. Heart Disease  Father, Mother. Heart disease in female family member before age 60  Hypertension   Father. Migraine Headache  Daughter. Rectal Cancer  Father. Seizure disorder  Son.  Pregnancy / Birth History Sabino Gasser, [place]; 11/18/2019 1:45 PM) Age at menarche  27 years. Contraceptive History  Intrauterine device. Gravida  4 Irregular periods  Length (months) of breastfeeding  12-24 Maternal age  41-25 Para  3  Other Problems Sabino Gasser, CMA; 11/18/2019 1:45 PM) Hemorrhoids  High blood pressure  Hypercholesterolemia  Kidney Stone  Migraine Headache  Sleep Apnea     Review of Systems  General Not Present- Appetite Loss, Chills, Fatigue, Fever, Night Sweats, Weight Gain and Weight Loss. Skin Not Present- Change in Wart/Mole, Dryness, Hives, Jaundice, New Lesions, Non-Healing Wounds, Rash and Ulcer. HEENT Present- Seasonal Allergies. Not Present- Earache, Hearing Loss, Hoarseness, Nose Bleed, Oral Ulcers, Ringing in the Ears, Sinus Pain, Sore Throat, Visual Disturbances, Wears glasses/contact lenses and Yellow Eyes. Respiratory Not Present- Bloody sputum, Chronic Cough, Difficulty Breathing, Snoring and Wheezing. Breast Not Present- Breast Mass, Breast Pain, Nipple Discharge and Skin Changes. Cardiovascular Not Present- Chest Pain, Difficulty Breathing Lying Down, Leg Cramps, Palpitations, Rapid Heart Rate, Shortness of Breath and Swelling of Extremities. Gastrointestinal Present- Chronic diarrhea, Excessive gas, Hemorrhoids and Rectal Pain. Not Present- Abdominal Pain, Bloating, Bloody Stool, Change in Bowel Habits, Constipation, Difficulty Swallowing, Gets full quickly at meals, Indigestion, Nausea and Vomiting. Female Genitourinary Present- Frequency. Not Present- Nocturia, Painful Urination, Pelvic Pain and Urgency. Musculoskeletal Present- Muscle Pain. Not Present- Back Pain, Joint Pain, Joint Stiffness, Muscle Weakness and Swelling of Extremities. Neurological Present- Headaches, Numbness and Tingling. Not Present- Decreased Memory, Fainting, Seizures,  Tremor, Trouble walking and Weakness. Psychiatric Not Present- Anxiety, Bipolar, Change in Sleep Pattern, Depression, Fearful and Frequent crying. Endocrine Not Present- Cold Intolerance, Excessive Hunger, Hair Changes, Heat Intolerance, Hot flashes and New Diabetes.  Hematology Not Present- Blood Thinners, Easy Bruising, Excessive bleeding, Gland problems, HIV and Persistent Infections.  BP 116/80 (BP Location: Right Arm)   Pulse 81   Temp 98.6 F (37 C) (Oral)   Resp 17   SpO2 100%     Physical Exam Leighton Ruff MD; [id number] 2:09 PM) General Mental Status-Alert. General Appearance-Cooperative. CV: RRR Lungs: CTA Rectal Anorectal Exam External - Note: right posterior external opening, 2 small inflammatory tags anteriorly.    Assessment & Plan  ANAL FISTULA (K60.3) Impression: 54 year old female with chronically loose stools who presents to the office with a perianal fistula. She has underwent an MRI which shows a right posterior intersphincteric fistula. Her exam seems to be consistent with this. I have recommended an exam under anesthesia with probable fistulotomy. She has 2 anterior inflammatory tags which she inquired about removing as well. We discussed that this can add some pain to her postoperative experience. We discussed that they can also recur quite readily.  She understands this and would like to have them removed as well.  We discussed the risk of recurrence with fistulotomy. We discussed the indications for placing a seton. We discussed the risk of incontinence with this type of fistulotomy is relatively low. We discussed that there are very few good surgical options besides fistulotomy for intersphincteric fistula.

## 2020-01-23 NOTE — Transfer of Care (Signed)
Immediate Anesthesia Transfer of Care Note  Patient: Kelly [name]  Procedure(s) Performed: Procedure(s): ANAL EXAM UNDER ANESTHESIA, FISTULOTOMY, SKIN TAG EXCISION (N/A)  Patient Location: PACU  Anesthesia Type:MAC  Level of Consciousness:  sedated, patient cooperative and responds to stimulation  Airway & Oxygen Therapy:Patient Spontanous Breathing and Patient connected to face mask oxgen  Post-op Assessment:  Report given to PACU RN and Post -op Vital signs reviewed and stable  Post vital signs:  Reviewed and stable  Last Vitals:  Vitals:   01/23/20 1225  BP: 116/80  Pulse: 81  Resp: 17  Temp: 37 C  SpO2: 503%    Complications: No apparent anesthesia complications

## 2020-01-24 LAB — SURGICAL PATHOLOGY

## 2020-02-07 MED FILL — OXYCODONE-APAP 5-325MG: 5-325 | 30 days supply | Qty: 90 | Fill #0

## 2020-02-11 DIAGNOSIS — G959 Disease of spinal cord, unspecified: Secondary | ICD-10-CM | POA: Diagnosis not present

## 2020-02-11 DIAGNOSIS — Q761 Klippel-Feil syndrome: Secondary | ICD-10-CM | POA: Diagnosis not present

## 2020-02-11 DIAGNOSIS — R200 Anesthesia of skin: Secondary | ICD-10-CM | POA: Diagnosis not present

## 2020-02-11 MED FILL — PREGABALIN 100 MG CAPS: 100 | 30 days supply | Qty: 60 | Fill #0

## 2020-03-09 MED FILL — OXYCODONE-APAP 5-325MG: 5-325 | 30 days supply | Qty: 90 | Fill #0

## 2020-03-13 DIAGNOSIS — R200 Anesthesia of skin: Secondary | ICD-10-CM | POA: Diagnosis not present

## 2020-03-13 DIAGNOSIS — R29898 Other symptoms and signs involving the musculoskeletal system: Secondary | ICD-10-CM | POA: Diagnosis not present

## 2020-03-16 ENCOUNTER — Ambulatory Visit: Payer: 59 | Attending: Neurological Surgery | Admitting: Physical Therapy

## 2020-03-16 ENCOUNTER — Encounter: Payer: Self-pay | Admitting: Physical Therapy

## 2020-03-16 ENCOUNTER — Other Ambulatory Visit: Payer: Self-pay

## 2020-03-16 DIAGNOSIS — M62838 Other muscle spasm: Secondary | ICD-10-CM | POA: Insufficient documentation

## 2020-03-16 DIAGNOSIS — M25612 Stiffness of left shoulder, not elsewhere classified: Secondary | ICD-10-CM | POA: Insufficient documentation

## 2020-03-16 DIAGNOSIS — M542 Cervicalgia: Secondary | ICD-10-CM | POA: Insufficient documentation

## 2020-03-16 DIAGNOSIS — M25512 Pain in left shoulder: Secondary | ICD-10-CM | POA: Diagnosis not present

## 2020-03-17 ENCOUNTER — Encounter: Payer: Self-pay | Admitting: Physical Therapy

## 2020-03-17 NOTE — Therapy (Signed)
[place], [place], [address] Phone: [phone]   Fax:  [phone]  Physical Therapy Re-Evaluation  Patient Details  Name: Kelly [name] MRN: [id number] Date of Birth: [birth year] Referring Provider (PT): Dr Sherley Bounds    Encounter Date: 03/16/2020   PT End of Session - 03/16/20 1302    Visit Number 1    Number of Visits 7    Date for PT Re-Evaluation 04/27/20    Authorization Type [employer]    PT Start Time 1146    PT Stop Time 1228    PT Time Calculation (min) 42 min    Activity Tolerance Patient tolerated treatment well    Behavior During Therapy [facility] for tasks assessed/performed           Past Medical History:  Diagnosis Date  . Anal fistula    intersphincteric   . Anemia   . Anticardiolipin antibody positive   . Cervicalgia    followed by dr Sherley Bounds  . Depression   . History of herpes genitalis    many yrs ago  . History of kidney stones   . Hyperlipidemia   . Hypertension    followed by pcp   (11-25-2019  per pt had stress test approx. 2018 w/ Dr Wynonia Lawman, told normal)  . Migraines    neurologist--- dr Jaynee Eagles  . OSA on CPAP   . TMJ (dislocation of temporomandibular joint)    11-25-2019  jaw pops, no appliance  . Wears glasses     Past Surgical History:  Procedure Laterality Date  . ANTERIOR CERVICAL DECOMP/DISCECTOMY FUSION  12/ 2020   [facility]   C4 - 6  . CESAREAN SECTION     X3   LAST ONE 11-30-2004  . CYSTOSCOPY W/ RETROGRADES Right 10/29/2012   Procedure: CYSTOSCOPY WITH RETROGRADE PYELOGRAM and stent placement;  Surgeon: Reece Packer, MD;  Location: [facility];  Service: Urology;  Laterality: Right;  rt stent placement , rt retrograde and cysto   . CYSTOSCOPY W/ URETERAL STENT PLACEMENT Right 11/23/2012   Procedure: CYSTOSCOPY WITH STENT REPLACEMENT;  Surgeon: Alexis Frock, MD;  [facility];   Service: Urology;  Laterality: Right;  . CYSTOSCOPY/RETROGRADE/URETEROSCOPY/STONE EXTRACTION WITH BASKET Right 11/23/2012   Procedure: CYSTOSCOPY/RETROGRADE/URETEROSCOPY/STONE EXTRACTION WITH BASKET;  Surgeon: Alexis Frock, MD;  Location: [facility];  Service: Urology;  Laterality: Right;  . EVALUATION UNDER ANESTHESIA WITH ANAL FISSUROTOMY N/A 01/23/2020   Procedure: ANAL EXAM UNDER ANESTHESIA, FISTULOTOMY, SKIN TAG EXCISION;  Surgeon: Leighton Ruff, MD;  Location: [facility];  Service: General;  Laterality: N/A;    There were no vitals filed for this visit.    Subjective Assessment - 03/16/20 1257    Subjective Patient reports her soreness has increased the last few weeks. She is not sure if it is from not coming to therapy or going back to work full time. She went back to work at the beginning of July. Pt continues to have numbnes into her L hand and is going to see a neurologist.    Pertinent History TMJ, migranes,    Limitations Sitting    How long can you stand comfortably? standing is OK    How long can you walk comfortably? no real diffulty    Diagnostic tests X-rays showed good fusion    Patient Stated Goals Less pain/tightness    Pain Location Neck    Pain Orientation Right;Left  Pain Descriptors / Indicators Aching    Pain Type Chronic pain                          Objective measurements completed on examination: See above findings.       [place] [place] Adult PT Treatment/Exercise - 03/17/20 0001      Neck Exercises: Standing   Other Standing Exercises rows x15 red band    Other Standing Exercises shoulder extension x15 red band      Neck Exercises: Supine   Shoulder ABduction 15 reps   red band   Other Supine Exercise bilateral ER x15 red band      Lumbar Exercises: Supine   Other Supine Lumbar Exercises bilateral horizpontal abduction 2x10 red     Other Supine Lumbar Exercises bilateral ER 2x10 red       Moist Heat Therapy   Moist Heat  Location Cervical      Electrical Stimulation   Electrical Stimulation Location cervical spine     Electrical Stimulation Goals Pain      Manual Therapy   Soft tissue mobilization trigger point release to upper traps; IASTM to upper traps    Manual Traction sub-occipital release                     PT Short Term Goals - 01/16/20 1433      PT SHORT TERM GOAL #1   Title Patient will be indepdent with relaxation and breathing techniques to reduce actue inflamtion.    Baseline independent    Period Weeks    Status Achieved    Target Date 11/08/19      PT SHORT TERM GOAL #2   Title Patient will report no radiating pain down both of her amrs    Baseline continues to have nbumbness, pain has improved    Time 3    Period Weeks    Status On-going    Target Date 02/06/20      PT SHORT TERM GOAL #3   Title Patient will increase gross bilateral UE strength to 4+/5    Baseline right 4+/5 left still 4/5    Time 3    Period Weeks    Status Partially Met    Target Date 02/06/20             PT Long Term Goals - 03/17/20 0911      PT LONG TERM GOAL #1   Title Patient will demonstrate 60 degrees of passive rotation ROM bilateral without pain    Baseline has had reduced motion to the left to 35 degrees but has not attened therapy for 2  months    Time 6    Period Weeks    Status On-going      PT LONG TERM GOAL #2   Title Patient will sit at her desk for 4 hours without pain    Baseline can sit for about 2-3 hours before she has pain    Time 6    Period Weeks    Status On-going      PT LONG TERM GOAL #3   Title Patient will reach overhead to a cabinet without pain    Baseline can perfrom with her right hand but continues to have numbness and decreased motor control on the left    Time 6    Period Weeks    Status Partially Met    Target Date 04/28/20  PT LONG TERM GOAL #4   Title increase FOTO score to </= 35% limited to demo improvement in function      Baseline FOTO deffered 2nd to Medicaid    Time 6    Period Weeks    Status Deferred      PT LONG TERM GOAL #5   Title pt to be I with all HEP given as of last visit to maintain and progress current level of function     Baseline continueing to progress exercies    Time 6    Period Weeks    Target Date 04/28/20                  Plan - 03/16/20 1304    Clinical Impression Statement Patient returns to outpatient PT with increased upper trap tightness and pain. She has not been to therapy for 2 months 2nd to a non-related surgery. Her cervical ROM has decreased in all directions, except extension which increased slightly. Patient continues to have numbness into her left arm. She retuned to work 4 weeks ago. Depsite her numbness and itngling she has been ble to tolerate work. She would benefit from skilled therapy to continue with manual therapy to ddecrease pain and functional strengthening to be able to complete work tasks.    Personal Factors and Comorbidities Comorbidity 1;Comorbidity 2;Profession    Comorbidities history of cervical spine pain, TMJ    Examination-Activity Limitations Lift;Reach Overhead;Locomotion Level    Examination-Participation Restrictions Church;Cleaning;Community Activity;Laundry;Meal Prep    Stability/Clinical Decision Making Evolving/Moderate complexity    Clinical Decision Making Moderate    Rehab Potential Good    PT Frequency 1x / week    PT Duration 6 weeks    PT Treatment/Interventions ADLs/Self Care Home Management;Cryotherapy;Electrical Stimulation;Iontophoresis 32m/ml Dexamethasone;Moist Heat;Ultrasound;Traction;DME Instruction;Gait training;Stair training;Functional mobility training;Therapeutic activities;Therapeutic exercise;Neuromuscular re-education;Patient/family education;Manual techniques;Passive range of motion;Dry needling;Taping    PT Next Visit Plan shoulder and posterior chain strengthening, continue manual on upper traps    PT Home  Exercise Plan t-band extensin , scpation , bilateral er,    Consulted and Agree with Plan of Care Patient           Patient will benefit from skilled therapeutic intervention in order to improve the following deficits and impairments:  Pain, Increased muscle spasms, Decreased activity tolerance, Decreased strength, Decreased range of motion, Decreased endurance, Increased fascial restricitons  Visit Diagnosis: Cervicalgia  Other muscle spasm  Acute pain of left shoulder  Stiffness of left shoulder, not elsewhere classified     Problem List Patient Active Problem List   Diagnosis Date Noted  . Stressful life event affecting family 07/12/2019  . Caregiver burden 07/12/2019  . Tobacco abuse counseling 07/12/2019  . Protrusion of cervical intervertebral disc 04/10/2019  . Spinal stenosis of cervical region 04/10/2019  . Overactive bladder 11/19/2018  . Photosensitization due to sun 11/02/2018  . Generalized pain 11/02/2018  . High risk medication use 11/02/2018  . Foot pain, bilateral 05/10/2018  . Heel pain, bilateral 05/10/2018  . Plantar fasciitis 05/10/2018  . Left knee pain 05/10/2018  . Patellofemoral pain syndrome of both knees 05/10/2018  . Generalized abdominal pain 04/17/2018  . Flank pain 04/17/2018  . Acute back pain 04/17/2018  . History of renal stone 04/17/2018  . Urinary frequency 04/17/2018  . Laceration of left hand 01/31/2018  . Decreased range of motion of finger of left hand 01/31/2018  . Localized swelling on left hand 01/31/2018  . Chronic migraine without aura without  status migrainosus, not intractable 11/01/2016  . History of migraine 06/10/2016  . Diarrhea 05/20/2016  . Pain in the chest 05/11/2016  . Dizziness and giddiness 05/11/2016  . History of hypertension 05/11/2016  . Hemorrhoidal skin tag 04/07/2015  . IUD (intrauterine device) in place 05/03/2013  . Allergic rhinitis 01/11/2013  . Current smoker 01/11/2013  . Weight gain  06/26/2012  . Cervical stenosis (uterine cervix) 04/25/2012  . Anticardiolipin antibody positive   . Hyperlipidemia   . Hypertension   . History of trichomoniasis 01/18/2011    Class: History of  . THYROID NODULE, RIGHT 02/05/2008  . GERD 02/05/2008  . NECK PAIN 02/05/2008   Check all possible CPT codes:      _0  97110 (Therapeutic Exercise)  _1  92507 (SLP Treatment)  _2  18867 (Neuro Re-ed)   _3  73736 (Swallowing Treatment)   _4  68159 (Gait Training)   _5  47076 (Cognitive Training, 1st 15 minutes) _6  97140 (Manual Therapy)   _7  97130 (Cognitive Training, each add'l 15 minutes)  _8  97530 (Therapeutic Activities)  _9  Other, List CPT Code ____________    _10  15183 (Self Care)       _11  All codes above (97110 - 97535)  _12  97012 (Mechanical Traction)  _13  97014 (E-stim Unattended)  _14  97032 (E-stim manual)  _15  97033 (Ionto)  _16  97035 (Ultrasound)  _17  97016 (Vaso)  _18  97760 (Orthotic Fit) _19  [id number] (Prosthetic Training) _20  [id number] (Physical Performance Training) _21  [id number] (Aquatic Therapy) _22  [id number] (Canalith Repositioning) _23  43735 (Contrast Bath) _24  [id number] (Paraffin) _25  97597 (Wound Care 1st 20 sq cm) _26  78978 (Wound Care each add'l 20 sq cm)     Carney Living PT DPT  03/17/2020, 9:16 AM  [facility] [address] [place], [place], [address] Phone: [phone]   Fax:  [phone]  Name: Kelly [name] MRN: [id number] Date of Birth: May 03, 1966

## 2020-03-27 ENCOUNTER — Ambulatory Visit: Payer: 59 | Admitting: Physical Therapy

## 2020-04-01 ENCOUNTER — Other Ambulatory Visit: Payer: Self-pay

## 2020-04-01 ENCOUNTER — Encounter: Payer: Self-pay | Admitting: Physical Therapy

## 2020-04-01 ENCOUNTER — Ambulatory Visit: Payer: 59 | Attending: Neurological Surgery | Admitting: Physical Therapy

## 2020-04-01 DIAGNOSIS — M62838 Other muscle spasm: Secondary | ICD-10-CM | POA: Insufficient documentation

## 2020-04-01 DIAGNOSIS — M542 Cervicalgia: Secondary | ICD-10-CM | POA: Diagnosis not present

## 2020-04-01 DIAGNOSIS — M25612 Stiffness of left shoulder, not elsewhere classified: Secondary | ICD-10-CM | POA: Insufficient documentation

## 2020-04-01 DIAGNOSIS — M25512 Pain in left shoulder: Secondary | ICD-10-CM | POA: Diagnosis not present

## 2020-04-01 NOTE — Therapy (Signed)
[place], [place], [address] Phone: [phone]   Fax:  [phone]  Physical Therapy Treatment  Patient Details  Name: Kelly [name] MRN: [id number] Date of Birth: 02/12/1966 Referring Provider (PT): Dr Sherley Bounds    Encounter Date: 04/01/2020   PT End of Session - 04/01/20 1618    Visit Number 2    Number of Visits 7    Date for PT Re-Evaluation 04/27/20    Authorization Type [employer]    PT Start Time 0932    PT Stop Time 1640    PT Time Calculation (min) 55 min    Activity Tolerance Patient tolerated treatment well    Behavior During Therapy [facility] for tasks assessed/performed           Past Medical History:  Diagnosis Date  . Anal fistula    intersphincteric   . Anemia   . Anticardiolipin antibody positive   . Cervicalgia    followed by dr Sherley Bounds  . Depression   . History of herpes genitalis    many yrs ago  . History of kidney stones   . Hyperlipidemia   . Hypertension    followed by pcp   (11-25-2019  per pt had stress test approx. 2018 w/ Dr Wynonia Lawman, told normal)  . Migraines    neurologist--- dr Jaynee Eagles  . OSA on CPAP   . TMJ (dislocation of temporomandibular joint)    11-25-2019  jaw pops, no appliance  . Wears glasses     Past Surgical History:  Procedure Laterality Date  . ANTERIOR CERVICAL DECOMP/DISCECTOMY FUSION  12/ 2020   [facility]  [facility]   C4 - 6  . CESAREAN SECTION     X3   LAST ONE 11-30-2004  . CYSTOSCOPY W/ RETROGRADES Right 10/29/2012   Procedure: CYSTOSCOPY WITH RETROGRADE PYELOGRAM and stent placement;  Surgeon: Reece Packer, MD;  Location: [place];  Service: Urology;  Laterality: Right;  rt stent placement , rt retrograde and cysto   . CYSTOSCOPY W/ URETERAL STENT PLACEMENT Right 11/23/2012   Procedure: CYSTOSCOPY WITH STENT REPLACEMENT;  Surgeon: Alexis Frock, MD;  Location: [facility];  Service:  Urology;  Laterality: Right;  . CYSTOSCOPY/RETROGRADE/URETEROSCOPY/STONE EXTRACTION WITH BASKET Right 11/23/2012   Procedure: CYSTOSCOPY/RETROGRADE/URETEROSCOPY/STONE EXTRACTION WITH BASKET;  Surgeon: Alexis Frock, MD;  Location: [facility];  Service: Urology;  Laterality: Right;  . EVALUATION UNDER ANESTHESIA WITH ANAL FISSUROTOMY N/A 01/23/2020   Procedure: ANAL EXAM UNDER ANESTHESIA, FISTULOTOMY, SKIN TAG EXCISION;  Surgeon: Leighton Ruff, MD;  Location: [facility];  Service: General;  Laterality: N/A;    There were no vitals filed for this visit.   Subjective Assessment - 04/01/20 1549    Subjective Pt reports she feels okay today. She continues to have tightness in her neck.    Pertinent History TMJ, migranes,    Limitations Sitting    How long can you stand comfortably? standing is OK    How long can you walk comfortably? no real diffulty    Diagnostic tests X-rays showed good fusion    Patient Stated Goals Less pain/tightness    Currently in Pain? Yes    Pain Score 3     Pain Location Neck    Pain Orientation Right;Left    Pain Descriptors / Indicators Aching    Pain Type Chronic pain    Pain Radiating Towards burning into her shoulder blades    Pain Onset  Today    Pain Frequency Constant    Aggravating Factors  use of the arm    Pain Relieving Factors rest    Effect of Pain on Daily Activities unable to work              [facility] PT Assessment - 04/01/20 0001      Assessment   Medical Diagnosis Neck Pain     Referring Provider (PT) Dr Sherley Bounds                          [facility] Adult PT Treatment/Exercise - 04/01/20 0001      Neck Exercises: Standing   Other Standing Exercises Rows 2x10 green band    Other Standing Exercises Shoulder Extension 2x10 green band      Neck Exercises: Seated   Other Seated Exercise bilateral ER 2x15 red band    Other Seated Exercise Horizontal abduction 2x15 red band      Lumbar Exercises: Aerobic   UBE  (Upper Arm Bike) 2 min posterior       Lumbar Exercises: Standing   Other Standing Lumbar Exercises scap retraction cx20 green shoulder extension x20 green     Other Standing Lumbar Exercises standing wall wash cw, ccw, flexion 2x10 bilateral       Lumbar Exercises: Supine   Other Supine Lumbar Exercises bilateral horizpontal abduction 2x10 red     Other Supine Lumbar Exercises bilateral ER 2x10 red       Moist Heat Therapy   Number Minutes Moist Heat 15 Minutes    Moist Heat Location Cervical      Electrical Stimulation   Electrical Stimulation Location cervical spine     Electrical Stimulation Goals Pain      Manual Therapy   Manual therapy comments IASTM to L upper trap    Soft tissue mobilization trigger point release to upper traps; IASTM to upper traps    Manual Traction sub-occipital release                   PT Education - 04/01/20 1618    Education Details HEP, discussed dry needling    Person(s) Educated Patient    Methods Explanation;Demonstration    Comprehension Verbalized understanding;Returned demonstration            PT Short Term Goals - 01/16/20 1433      PT SHORT TERM GOAL #1   Title Patient will be indepdent with relaxation and breathing techniques to reduce actue inflamtion.    Baseline independent    Period Weeks    Status Achieved    Target Date 11/08/19      PT SHORT TERM GOAL #2   Title Patient will report no radiating pain down both of her amrs    Baseline continues to have nbumbness, pain has improved    Time 3    Period Weeks    Status On-going    Target Date 02/06/20      PT SHORT TERM GOAL #3   Title Patient will increase gross bilateral UE strength to 4+/5    Baseline right 4+/5 left still 4/5    Time 3    Period Weeks    Status Partially Met    Target Date 02/06/20             PT Long Term Goals - 03/17/20 0911      PT LONG TERM GOAL #1   Title Patient will demonstrate  60 degrees of passive rotation ROM  bilateral without pain    Baseline has had reduced motion to the left to 35 degrees but has not attened therapy for 2  months    Time 6    Period Weeks    Status On-going      PT LONG TERM GOAL #2   Title Patient will sit at her desk for 4 hours without pain    Baseline can sit for about 2-3 hours before she has pain    Time 6    Period Weeks    Status On-going      PT LONG TERM GOAL #3   Title Patient will reach overhead to a cabinet without pain    Baseline can perfrom with her right hand but continues to have numbness and decreased motor control on the left    Time 6    Period Weeks    Status Partially Met    Target Date 04/28/20      PT LONG TERM GOAL #4   Title increase FOTO score to </= 35% limited to demo improvement in function     Baseline FOTO deffered 2nd to Medicaid    Time 6    Period Weeks    Status Deferred      PT LONG TERM GOAL #5   Title pt to be I with all HEP given as of last visit to maintain and progress current level of function     Baseline continueing to progress exercies    Time 6    Period Weeks    Target Date 04/28/20                 Plan - 04/01/20 1620    Clinical Impression Statement Pt continues to have upper trap tightness and numbness into her left hand. Therapy advised pt to talk to MD about hand numbness and hand's feeling cold. STW was done in bilateral upper traps to release muscle tightness and trigger points. 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 Pt tolerated treatment well.    Personal Factors and Comorbidities Comorbidity 1;Comorbidity 2;Profession    Comorbidities history of cervical spine pain, TMJ    Examination-Activity Limitations Lift;Reach Overhead;Locomotion Level    Examination-Participation Restrictions Church;Cleaning;Community Activity;Laundry;Meal Prep    Stability/Clinical Decision Making Evolving/Moderate complexity    Rehab Potential Good    PT Frequency 1x / week    PT Duration 6  weeks    PT Treatment/Interventions ADLs/Self Care Home Management;Cryotherapy;Electrical Stimulation;Iontophoresis '4mg'$ /ml Dexamethasone;Moist Heat;Ultrasound;Traction;DME Instruction;Gait training;Stair training;Functional mobility training;Therapeutic activities;Therapeutic exercise;Neuromuscular re-education;Patient/family education;Manual techniques;Passive range of motion;Dry needling;Taping    PT Next Visit Plan shoulder and posterior chain strengthening, continue manual on upper traps    PT Home Exercise Plan t-band extensin , scpation , bilateral er,    Consulted and Agree with Plan of Care Patient           Patient will benefit from skilled therapeutic intervention in order to improve the following deficits and impairments:  Pain, Increased muscle spasms, Decreased activity tolerance, Decreased strength, Decreased range of motion, Decreased endurance, Increased fascial restricitons  Visit Diagnosis: Cervicalgia  Other muscle spasm  Acute pain of left shoulder  Stiffness of left shoulder, not elsewhere classified     Problem List Patient Active Problem List   Diagnosis Date Noted  . Stressful life event affecting family 07/12/2019  . Caregiver burden 07/12/2019  . Tobacco abuse counseling 07/12/2019  . Protrusion of cervical intervertebral disc 04/10/2019  .  Spinal stenosis of cervical region 04/10/2019  . Overactive bladder 11/19/2018  . Photosensitization due to sun 11/02/2018  . Generalized pain 11/02/2018  . High risk medication use 11/02/2018  . Foot pain, bilateral 05/10/2018  . Heel pain, bilateral 05/10/2018  . Plantar fasciitis 05/10/2018  . Left knee pain 05/10/2018  . Patellofemoral pain syndrome of both knees 05/10/2018  . Generalized abdominal pain 04/17/2018  . Flank pain 04/17/2018  . Acute back pain 04/17/2018  . History of renal stone 04/17/2018  . Urinary frequency 04/17/2018  . Laceration of left hand 01/31/2018  . Decreased range of motion of  finger of left hand 01/31/2018  . Localized swelling on left hand 01/31/2018  . Chronic migraine without aura without status migrainosus, not intractable 11/01/2016  . History of migraine 06/10/2016  . Diarrhea 05/20/2016  . Pain in the chest 05/11/2016  . Dizziness and giddiness 05/11/2016  . History of hypertension 05/11/2016  . Hemorrhoidal skin tag 04/07/2015  . IUD (intrauterine device) in place 05/03/2013  . Allergic rhinitis 01/11/2013  . Current smoker 01/11/2013  . Weight gain 06/26/2012  . Cervical stenosis (uterine cervix) 04/25/2012  . Anticardiolipin antibody positive   . Hyperlipidemia   . Hypertension   . History of trichomoniasis 01/18/2011    Class: History of  . THYROID NODULE, RIGHT 02/05/2008  . GERD 02/05/2008  . NECK PAIN 02/05/2008    Carney Living PT DPT  04/01/2020, 4:54 PM   [facility]  SPT   During this treatment session, the therapist was present, participating in and directing the treatment.   [place], [place], [address] Phone: [phone]   Fax:  [phone]  Name: Kelly [name] MRN: [id number] Date of Birth: March 20, 1966  [facility]

## 2020-04-07 MED FILL — METOPROLOL TARTRATE 50 MG T: 50 | 30 days supply | Qty: 60 | Fill #1

## 2020-04-07 MED FILL — OXYCODONE-APAP 5-325MG: 5-325 | 30 days supply | Qty: 90 | Fill #0

## 2020-04-08 ENCOUNTER — Other Ambulatory Visit: Payer: Self-pay

## 2020-04-08 ENCOUNTER — Ambulatory Visit: Payer: 59 | Admitting: Physical Therapy

## 2020-04-08 ENCOUNTER — Encounter: Payer: Self-pay | Admitting: Physical Therapy

## 2020-04-08 DIAGNOSIS — M25512 Pain in left shoulder: Secondary | ICD-10-CM | POA: Diagnosis not present

## 2020-04-08 DIAGNOSIS — M542 Cervicalgia: Secondary | ICD-10-CM

## 2020-04-08 DIAGNOSIS — M62838 Other muscle spasm: Secondary | ICD-10-CM | POA: Diagnosis not present

## 2020-04-08 DIAGNOSIS — M25612 Stiffness of left shoulder, not elsewhere classified: Secondary | ICD-10-CM | POA: Diagnosis not present

## 2020-04-09 NOTE — Therapy (Signed)
[place], [place], [address] Phone: [phone]   Fax:  [phone]  Physical Therapy Treatment  Patient Details  Name: Kelly [name] MRN: [id number] Date of Birth: 1965/08/25 Referring Provider (PT): Dr Sherley Bounds    Encounter Date: 04/08/2020   PT End of Session - 04/09/20 0726    Visit Number 3    Number of Visits 7    Date for PT Re-Evaluation 04/27/20    Authorization Type [employer]    PT Start Time 1630    PT Stop Time 1725    PT Time Calculation (min) 55 min    Activity Tolerance Patient tolerated treatment well    Behavior During Therapy [facility] for tasks assessed/performed           Past Medical History:  Diagnosis Date  . Anal fistula    intersphincteric   . Anemia   . Anticardiolipin antibody positive   . Cervicalgia    followed by dr Sherley Bounds  . Depression   . History of herpes genitalis    many yrs ago  . History of kidney stones   . Hyperlipidemia   . Hypertension    followed by pcp   (11-25-2019  per pt had stress test approx. 2018 w/ Dr Wynonia Lawman, told normal)  . Migraines    neurologist--- dr Jaynee Eagles  . OSA on CPAP   . TMJ (dislocation of temporomandibular joint)    11-25-2019  jaw pops, no appliance  . Wears glasses     Past Surgical History:  Procedure Laterality Date  . ANTERIOR CERVICAL DECOMP/DISCECTOMY FUSION  12/ 2020   [facility]   C4 - 6  . CESAREAN SECTION     X3   LAST ONE 11-30-2004  . CYSTOSCOPY W/ RETROGRADES Right 10/29/2012   Procedure: CYSTOSCOPY WITH RETROGRADE PYELOGRAM and stent placement;  Surgeon: Reece Packer, MD;  Location: [place];  Service: Urology;  Laterality: Right;  rt stent placement , rt retrograde and cysto   . CYSTOSCOPY W/ URETERAL STENT PLACEMENT Right 11/23/2012   Procedure: CYSTOSCOPY WITH STENT REPLACEMENT;  Surgeon: Alexis Frock, MD;  Location: [facility];  Service:  Urology;  Laterality: Right;  . CYSTOSCOPY/RETROGRADE/URETEROSCOPY/STONE EXTRACTION WITH BASKET Right 11/23/2012   Procedure: CYSTOSCOPY/RETROGRADE/URETEROSCOPY/STONE EXTRACTION WITH BASKET;  Surgeon: Alexis Frock, MD;  Location: [facility];  Service: Urology;  Laterality: Right;  . EVALUATION UNDER ANESTHESIA WITH ANAL FISSUROTOMY N/A 01/23/2020   Procedure: ANAL EXAM UNDER ANESTHESIA, FISTULOTOMY, SKIN TAG EXCISION;  Surgeon: Leighton Ruff, MD;  Location: [facility];  Service: General;  Laterality: N/A;    There were no vitals filed for this visit.   Subjective Assessment - 04/09/20 0725    Subjective Patient continues to report new soreness after PT. She has been able to work but continues to have the same probelms    Pertinent History TMJ, migranes,    Limitations Sitting    How long can you stand comfortably? standing is OK    How long can you walk comfortably? no real diffulty    Diagnostic tests X-rays showed good fusion    Patient Stated Goals Less pain/tightness    Currently in Pain? Yes    Pain Score 5     Pain Location Neck    Pain Orientation Right;Left    Pain Descriptors / Indicators Aching    Pain Type Chronic pain    Pain Onset Today  Pain Frequency Constant    Aggravating Factors  use of the arm    Pain Relieving Factors rest    Effect of Pain on Daily Activities difficulty doing work                             [facility] Adult PT Treatment/Exercise - 04/09/20 0001      Neck Exercises: Machines for Strengthening   UBE (Upper Arm Bike) 2 min forward 2 min back       Neck Exercises: Standing   Other Standing Exercises Rows 2x10 green band      Neck Exercises: Seated   Other Seated Exercise bilateral ER 2x15 red band    Other Seated Exercise Horizontal abduction 2x15 red band      Moist Heat Therapy   Number Minutes Moist Heat 15 Minutes    Moist Heat Location Cervical      Manual Therapy   Manual therapy comments IASTM to L  upper trap    Soft tissue mobilization trigger point release to upper traps; IASTM to upper traps    Manual Traction sub-occipital release                   PT Education - 04/09/20 0726    Education Details reviewed HEP and sytmpom managment    Person(s) Educated Patient    Methods Explanation;Demonstration;Tactile cues;Verbal cues    Comprehension Verbalized understanding;Returned demonstration;Verbal cues required;Tactile cues required            PT Short Term Goals - 01/16/20 1433      PT SHORT TERM GOAL #1   Title Patient will be indepdent with relaxation and breathing techniques to reduce actue inflamtion.    Baseline independent    Period Weeks    Status Achieved    Target Date 11/08/19      PT SHORT TERM GOAL #2   Title Patient will report no radiating pain down both of her amrs    Baseline continues to have nbumbness, pain has improved    Time 3    Period Weeks    Status On-going    Target Date 02/06/20      PT SHORT TERM GOAL #3   Title Patient will increase gross bilateral UE strength to 4+/5    Baseline right 4+/5 left still 4/5    Time 3    Period Weeks    Status Partially Met    Target Date 02/06/20             PT Long Term Goals - 04/09/20 0730      PT LONG TERM GOAL #1   Title Patient will demonstrate 60 degrees of passive rotation ROM bilateral without pain    Baseline has had reduced motion to the left to 35 degrees but has not attened therapy for 2  months    Time 6    Period Weeks    Status On-going      PT LONG TERM GOAL #2   Title Patient will sit at her desk for 4 hours without pain    Baseline can sit for about 2-3 hours before she has pain    Time 6    Period Weeks    Status On-going      PT LONG TERM GOAL #3   Title Patient will reach overhead to a cabinet without pain    Baseline can perfrom with her right hand but continues  to have numbness and decreased motor control on the left    Time 6    Period Weeks    Status  On-going      PT LONG TERM GOAL #4   Title increase FOTO score to </= 35% limited to demo improvement in function     Baseline FOTO deffered 2nd to Medicaid      PT LONG TERM GOAL #5   Title pt to be I with all HEP given as of last visit to maintain and progress current level of function     Baseline continueing to progress exercies    Time 6    Period Weeks    Status On-going                 Plan - 04/09/20 0727    Clinical Impression Statement Therapy focused more on manual therapy today but improvements in the past have been trnasient. If we are unable to progress exercises 2nd to increased soreness then the patient has reached maxc potential for physical therapy. We have not perfromed needling yet but in the past she has not had a positive reactipon to needling. Patienbt will have a follow up with Dr Jaynee Eagles this week. We will likely D/C to HEP and review self soft tissue mobilization next visit.    Personal Factors and Comorbidities Comorbidity 1;Comorbidity 2;Profession    Comorbidities history of cervical spine pain, TMJ           Patient will benefit from skilled therapeutic intervention in order to improve the following deficits and impairments:  Pain, Increased muscle spasms, Decreased activity tolerance, Decreased strength, Decreased range of motion, Decreased endurance, Increased fascial restricitons  Visit Diagnosis: Cervicalgia  Other muscle spasm     Problem List Patient Active Problem List   Diagnosis Date Noted  . Stressful life event affecting family 07/12/2019  . Caregiver burden 07/12/2019  . Tobacco abuse counseling 07/12/2019  . Protrusion of cervical intervertebral disc 04/10/2019  . Spinal stenosis of cervical region 04/10/2019  . Overactive bladder 11/19/2018  . Photosensitization due to sun 11/02/2018  . Generalized pain 11/02/2018  . High risk medication use 11/02/2018  . Foot pain, bilateral 05/10/2018  . Heel pain, bilateral 05/10/2018   . Plantar fasciitis 05/10/2018  . Left knee pain 05/10/2018  . Patellofemoral pain syndrome of both knees 05/10/2018  . Generalized abdominal pain 04/17/2018  . Flank pain 04/17/2018  . Acute back pain 04/17/2018  . History of renal stone 04/17/2018  . Urinary frequency 04/17/2018  . Laceration of left hand 01/31/2018  . Decreased range of motion of finger of left hand 01/31/2018  . Localized swelling on left hand 01/31/2018  . Chronic migraine without aura without status migrainosus, not intractable 11/01/2016  . History of migraine 06/10/2016  . Diarrhea 05/20/2016  . Pain in the chest 05/11/2016  . Dizziness and giddiness 05/11/2016  . History of hypertension 05/11/2016  . Hemorrhoidal skin tag 04/07/2015  . IUD (intrauterine device) in place 05/03/2013  . Allergic rhinitis 01/11/2013  . Current smoker 01/11/2013  . Weight gain 06/26/2012  . Cervical stenosis (uterine cervix) 04/25/2012  . Anticardiolipin antibody positive   . Hyperlipidemia   . Hypertension   . History of trichomoniasis 01/18/2011    Class: History of  . THYROID NODULE, RIGHT 02/05/2008  . GERD 02/05/2008  . NECK PAIN 02/05/2008    Carney Living PT DPT  04/09/2020, 7:50 AM  [place]  [place], [place], [address] Phone: [phone]   Fax:  [phone]  Name: ESTEE YOHE MRN: [id number] Date of Birth: 29-Oct-1965

## 2020-04-13 ENCOUNTER — Encounter: Payer: Self-pay | Admitting: *Deleted
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
## 2020-04-14 ENCOUNTER — Ambulatory Visit (INDEPENDENT_AMBULATORY_CARE_PROVIDER_SITE_OTHER): Payer: 59 | Admitting: Neurology

## 2020-04-14 ENCOUNTER — Encounter: Payer: Self-pay | Admitting: Neurology

## 2020-04-14 ENCOUNTER — Telehealth: Payer: Self-pay | Admitting: Neurology

## 2020-04-14 VITALS — BP 128/84 | HR 73 | Ht 64.0 in | Wt 174.0 lb

## 2020-04-14 DIAGNOSIS — E538 Deficiency of other specified B group vitamins: Secondary | ICD-10-CM | POA: Diagnosis not present

## 2020-04-14 DIAGNOSIS — R29898 Other symptoms and signs involving the musculoskeletal system: Secondary | ICD-10-CM

## 2020-04-14 DIAGNOSIS — R278 Other lack of coordination: Secondary | ICD-10-CM | POA: Diagnosis not present

## 2020-04-14 DIAGNOSIS — R200 Anesthesia of skin: Secondary | ICD-10-CM

## 2020-04-14 DIAGNOSIS — R29818 Other symptoms and signs involving the nervous system: Secondary | ICD-10-CM | POA: Diagnosis not present

## 2020-04-14 DIAGNOSIS — R251 Tremor, unspecified: Secondary | ICD-10-CM

## 2020-04-14 DIAGNOSIS — R202 Paresthesia of skin: Secondary | ICD-10-CM | POA: Diagnosis not present

## 2020-04-14 IMAGING — CT CT CERVICAL SPINE W/O CM
2 series · 10 of 14 positions shown, 12 images · non-contrast
Comparison: CT scan dated 05/03/2009 and cervical MRI dated
10/07/2019

CLINICAL DATA: Neck and bilateral shoulder blade pain. Bilateral
hand numbness. Previous anterior cervical fusion at C4-5 and C5-6.

EXAM:
CT CERVICAL SPINE WITHOUT CONTRAST
TECHNIQUE: Multidetector CT imaging of the cervical spine was performed without
intravenous contrast. Multiplanar CT image reconstructions were also
generated.

[Series 3: cspine soft · axial · 0.31mm/px · z∈[-203,-99]mm · 5 of 79 slices shown]
[im 14/79  soft-tissue]
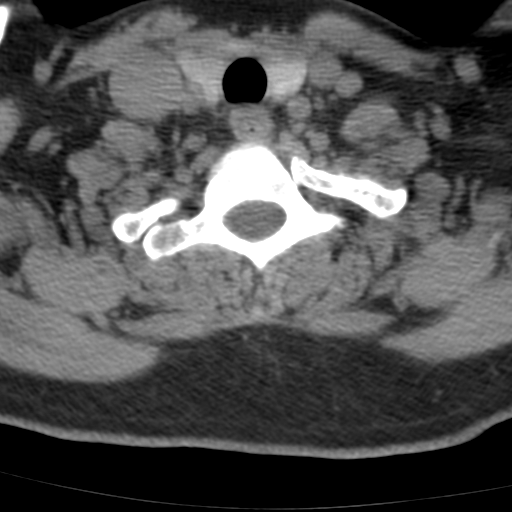
[im 27/79  soft-tissue]
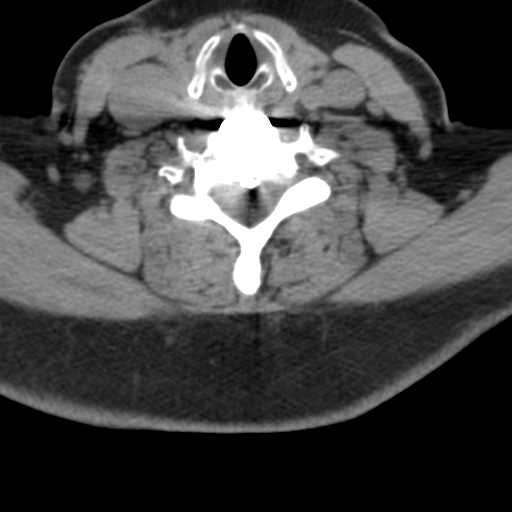
[im 40/79  soft-tissue]
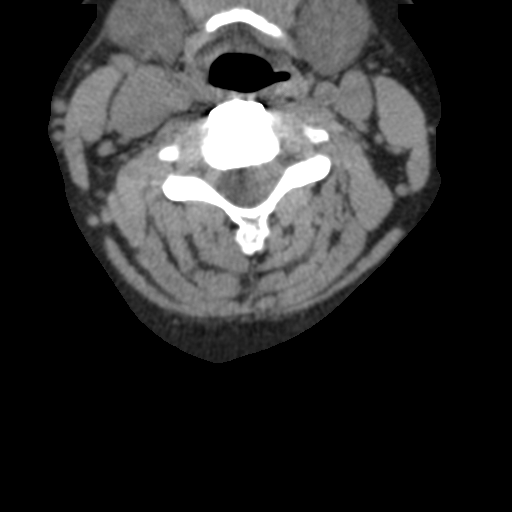
[im 53/79  soft-tissue]
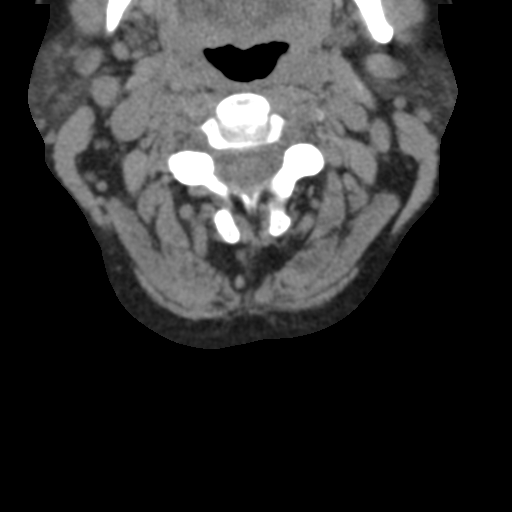
[im 66/79  soft-tissue]
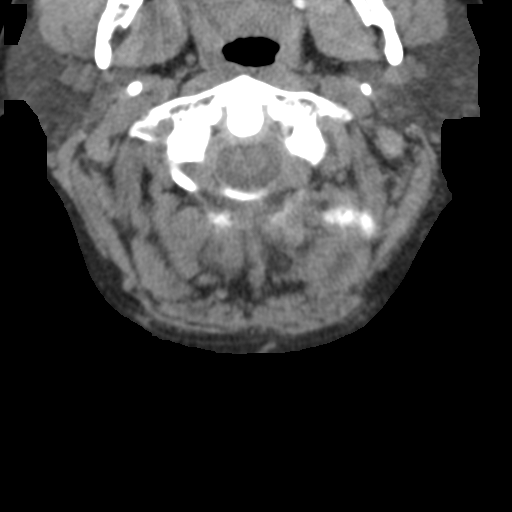

[Series 9: angled axial · axial · 0.29mm/px · z∈[-206,-103]mm · 5 of 80 slices shown, 7 images]
[im 14/80  soft-tissue]
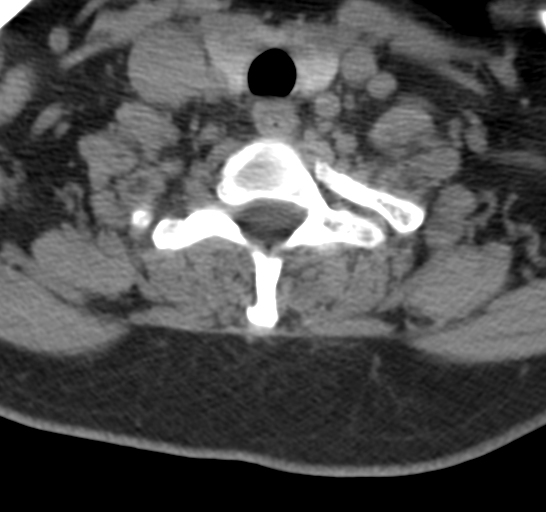
[im 14/80  bone]
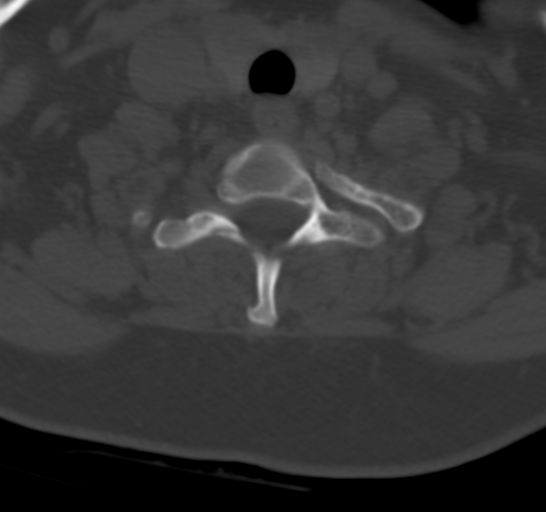
[im 27/80  bone]
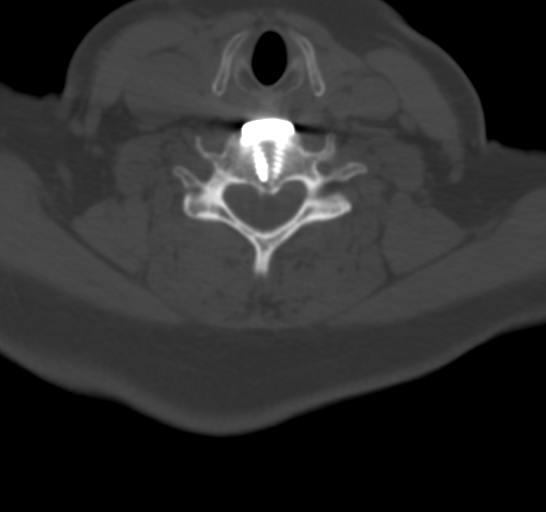
[im 40/80  bone]
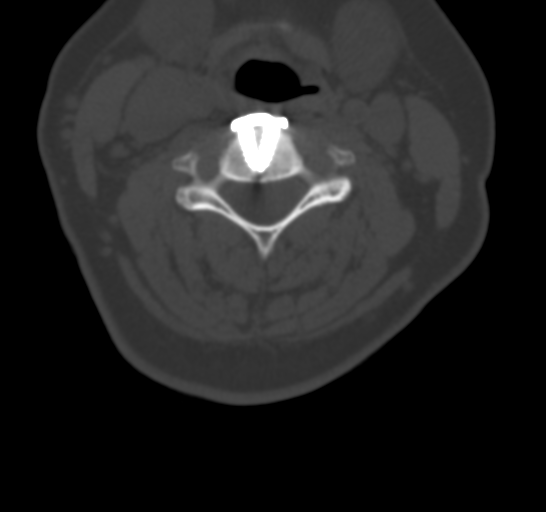
[im 53/80  bone]
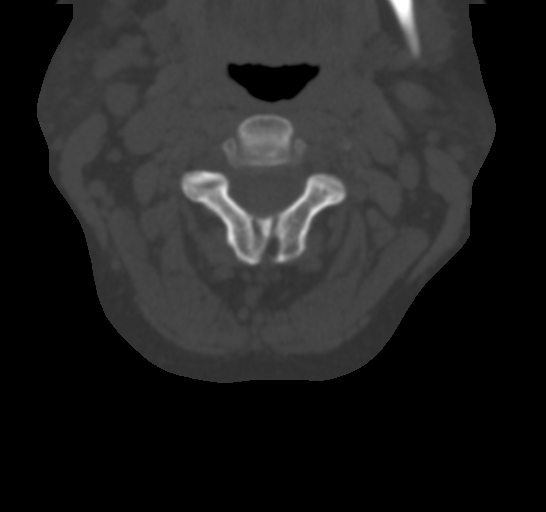
[im 66/80  soft-tissue]
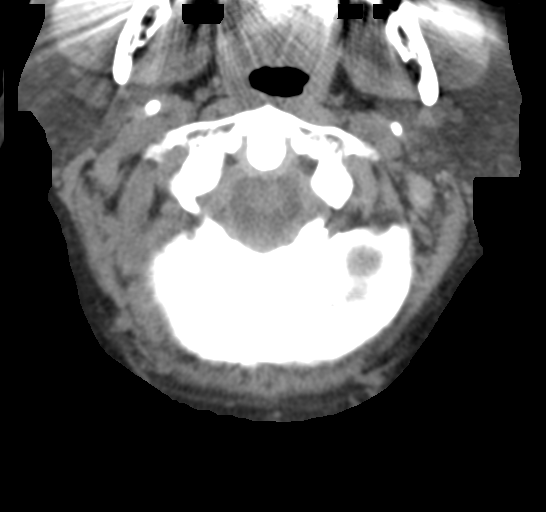
[im 66/80  bone]
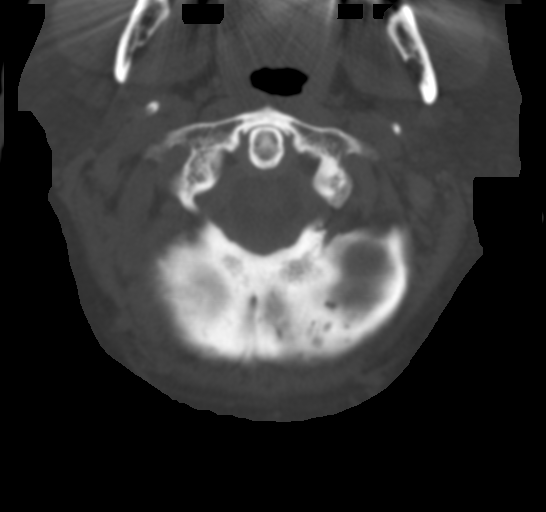

[10 of 14 positions shown; findings below may reference images not displayed]

FINDINGS: Alignment: Normal.

Skull base and vertebrae: Solid anterior fusions at C4-5 and C5-6.
No fractures or bone destruction.

Soft tissues and spinal canal: No prevertebral fluid or swelling. No
visible canal hematoma.

Disc levels:  C2-3: Normal.

C3-4: Normal.

C4-5 and C5-6: Solid anterior fusions. No residual neural
impingement. Widely patent neural foramina.

C6-7: Normal. Widely patent neural foramina.

C7-T1 and T1-2: Normal.

Upper chest: Lung apices are clear.

Other: None
IMPRESSION: 1. Solid anterior fusions at C4-5 and C5-6 with no residual neural
impingement.
2. Otherwise, normal CT scan of the cervical spine.

## 2020-04-14 NOTE — Patient Instructions (Addendum)
MRI brain Blood work   Tremor A tremor is trembling or shaking that you cannot control. Most tremors affect the hands or arms. Tremors can also affect the head, vocal cords, face, and other parts of the body. There are many types of tremors. Common types include:  Essential tremor. These usually occur in people older than 40. It may run in families and can happen in otherwise healthy people.  Resting tremor. These occur when the muscles are at rest, such as when your hands are resting in your lap. People with Parkinson's disease often have resting tremors.  Postural tremor. These occur when you try to hold a pose, such as keeping your hands outstretched.  Kinetic tremor. These occur during purposeful movement, such as trying to touch a finger to your nose.  Task-specific tremor. These may occur when you perform certain tasks such as writing, speaking, or standing.  Psychogenic tremor. These dramatically lessen or disappear when you are distracted. They can happen in people of all ages. Some types of tremors have no known cause. Tremors can also be a symptom of nervous system problems (neurological disorders) that may occur with aging. Some tremors go away with treatment, while others do not. Follow these instructions at home: Lifestyle      Limit alcohol intake to no more than 1 drink a day for nonpregnant women and 2 drinks a day for men. One drink equals 12 oz of beer, 5 oz of wine, or 1 oz of hard liquor.  Do not use any products that contain nicotine or tobacco, such as cigarettes and e-cigarettes. If you need help quitting, ask your health care provider.  Avoid extreme heat and extreme cold.  Limit your caffeine intake, as told by your health care provider.  Try to get 8 hours of sleep each night.  Find ways to manage your stress, such as meditation or yoga. General instructions  Take over-the-counter and prescription medicines only as told by your health care  provider.  Keep all follow-up visits as told by your health care provider. This is important. Contact a health care provider if you:  Develop a tremor after starting a new medicine.  Have a tremor along with other symptoms such as: ? Numbness. ? Tingling. ? Pain. ? Weakness.  Notice that your tremor gets worse.  Notice that your tremor interferes with your day-to-day life. Summary  A tremor is trembling or shaking that you cannot control.  Most tremors affect the hands or arms.  Some types of tremors have no known cause. Others may be a symptom of nervous system problems (neurological disorders).  Make sure you discuss any tremors you have with your health care provider. This information is not intended to replace advice given to you by your health care provider. Make sure you discuss any questions you have with your health care provider. Document Revised: 07/21/2017 Document Reviewed: 06/08/2017 Elsevier Patient Education  2020 [employer].

## 2020-04-14 NOTE — Telephone Encounter (Signed)
[facility] pending.

## 2020-04-15 ENCOUNTER — Ambulatory Visit: Payer: 59 | Admitting: Physical Therapy

## 2020-04-15 LAB — CBC
Hematocrit: 37.8 % (ref 34.0–46.6)
Hemoglobin: 12.8 g/dL (ref 11.1–15.9)
MCH: 31.8 pg (ref 26.6–33.0)
MCHC: 33.9 g/dL (ref 31.5–35.7)
MCV: 94 fL (ref 79–97)
Platelets: 268 10*3/uL (ref 150–450)
RBC: 4.03 x10E6/uL (ref 3.77–5.28)
RDW: 12.6 % (ref 11.7–15.4)
WBC: 7.5 10*3/uL (ref 3.4–10.8)

## 2020-04-15 LAB — COMPREHENSIVE METABOLIC PANEL
ALT: 12 IU/L (ref 0–32)
AST: 15 IU/L (ref 0–40)
Albumin/Globulin Ratio: 1.8 (ref 1.2–2.2)
Albumin: 4.2 g/dL (ref 3.8–4.9)
Alkaline Phosphatase: 112 IU/L (ref 48–121)
BUN/Creatinine Ratio: 14 (ref 9–23)
BUN: 11 mg/dL (ref 6–24)
Bilirubin Total: 0.3 mg/dL (ref 0.0–1.2)
CO2: 21 mmol/L (ref 20–29)
Calcium: 9.1 mg/dL (ref 8.7–10.2)
Chloride: 111 mmol/L — ABNORMAL HIGH (ref 96–106)
Creatinine, Ser: 0.76 mg/dL (ref 0.57–1.00)
GFR calc Af Amer: 104 mL/min/{1.73_m2} (ref >59)
GFR calc non Af Amer: 90 mL/min/{1.73_m2} (ref >59)
Globulin, Total: 2.3 g/dL (ref 1.5–4.5)
Glucose: 79 mg/dL (ref 65–99)
Potassium: 4.5 mmol/L (ref 3.5–5.2)
Sodium: 146 mmol/L — ABNORMAL HIGH (ref 134–144)
Total Protein: 6.5 g/dL (ref 6.0–8.5)

## 2020-04-15 LAB — T4, FREE: Free T4: 1.26 ng/dL (ref 0.82–1.77)

## 2020-04-15 LAB — TSH: TSH: 1.92 u[IU]/mL (ref 0.450–4.500)

## 2020-04-15 NOTE — Telephone Encounter (Signed)
[facility] Kelly [name]: [id number] (exp. 04/14/20 to 06/12/20) order sent to GI . They will reach out to the patient to schedule.

## 2020-04-21 ENCOUNTER — Ambulatory Visit
Admission: RE | Admit: 2020-04-21 | Discharge: 2020-04-21 | Disposition: A | Discharge status: Home / Self Care | Payer: 59 | Source: Ambulatory Visit | Attending: Neurology | Admitting: Neurology

## 2020-04-21 DIAGNOSIS — R202 Paresthesia of skin: Secondary | ICD-10-CM

## 2020-04-21 DIAGNOSIS — R200 Anesthesia of skin: Secondary | ICD-10-CM | POA: Diagnosis not present

## 2020-04-21 DIAGNOSIS — R29818 Other symptoms and signs involving the nervous system: Secondary | ICD-10-CM

## 2020-04-21 DIAGNOSIS — R29898 Other symptoms and signs involving the musculoskeletal system: Secondary | ICD-10-CM

## 2020-04-21 DIAGNOSIS — R278 Other lack of coordination: Secondary | ICD-10-CM

## 2020-04-21 DIAGNOSIS — R251 Tremor, unspecified: Secondary | ICD-10-CM | POA: Diagnosis not present

## 2020-04-21 MED ORDER — GADOBENATE DIMEGLUMINE 529 MG/ML IV SOLN
16.0000 mL | Freq: Once | INTRAVENOUS | Status: AC | PRN
  Administered 2020-04-21: 16 mL via INTRAVENOUS

## 2020-04-22 ENCOUNTER — Encounter: Payer: Self-pay | Admitting: Physical Therapy

## 2020-04-22 ENCOUNTER — Ambulatory Visit: Payer: 59 | Attending: Neurological Surgery | Admitting: Physical Therapy

## 2020-04-22 ENCOUNTER — Other Ambulatory Visit: Payer: Self-pay

## 2020-04-22 DIAGNOSIS — M25612 Stiffness of left shoulder, not elsewhere classified: Secondary | ICD-10-CM | POA: Insufficient documentation

## 2020-04-22 DIAGNOSIS — M542 Cervicalgia: Secondary | ICD-10-CM | POA: Insufficient documentation

## 2020-04-22 DIAGNOSIS — M25512 Pain in left shoulder: Secondary | ICD-10-CM | POA: Insufficient documentation

## 2020-04-22 DIAGNOSIS — M62838 Other muscle spasm: Secondary | ICD-10-CM | POA: Insufficient documentation

## 2020-04-23 NOTE — Therapy (Signed)
[place], [place], [address] Phone: [phone]   Fax:  [phone]  Physical Therapy Treatment  Patient Details  Name: Kelly [name] MRN: [id number] Date of Birth: Aug 27, 1965 Referring Provider (PT): Dr Sherley Bounds    Encounter Date: 04/22/2020   PT End of Session - 04/22/20 1656    Visit Number 4    Number of Visits 7    Date for PT Re-Evaluation 04/27/20    Authorization Type [facility]    PT Start Time 1630    PT Stop Time 1723    PT Time Calculation (min) 53 min    Activity Tolerance Patient tolerated treatment well    Behavior During Therapy [facility] for tasks assessed/performed           Past Medical History:  Diagnosis Date  . Anal fistula    intersphincteric   . Anemia   . Anticardiolipin antibody positive   . Cervicalgia    followed by dr Sherley Bounds  . Depression   . History of herpes genitalis    many yrs ago  . History of kidney stones   . Hyperlipidemia   . Hypertension    followed by pcp   (11-25-2019  per pt had stress test approx. 2018 w/ Dr Wynonia Lawman, told normal)  . Migraines    neurologist--- dr Jaynee Eagles  . OSA on CPAP   . TMJ (dislocation of temporomandibular joint)    11-25-2019  jaw pops, no appliance  . Wears glasses     Past Surgical History:  Procedure Laterality Date  . ANTERIOR CERVICAL DECOMP/DISCECTOMY FUSION  12/ 2020   [facility]   C4 - 6  . CESAREAN SECTION     X3   LAST ONE 11-30-2004  . CYSTOSCOPY W/ RETROGRADES Right 10/29/2012   Procedure: CYSTOSCOPY WITH RETROGRADE PYELOGRAM and stent placement;  Surgeon: Reece Packer, MD;  Location: [facility];  Service: Urology;  Laterality: Right;  rt stent placement , rt retrograde and cysto   . CYSTOSCOPY W/ URETERAL STENT PLACEMENT Right 11/23/2012   Procedure: CYSTOSCOPY WITH STENT REPLACEMENT;  Surgeon: Alexis Frock, MD;  Location: [facility];  Service:  Urology;  Laterality: Right;  . CYSTOSCOPY/RETROGRADE/URETEROSCOPY/STONE EXTRACTION WITH BASKET Right 11/23/2012   Procedure: CYSTOSCOPY/RETROGRADE/URETEROSCOPY/STONE EXTRACTION WITH BASKET;  Surgeon: Alexis Frock, MD;  Location: [facility];  Service: Urology;  Laterality: Right;  . EVALUATION UNDER ANESTHESIA WITH ANAL FISSUROTOMY N/A 01/23/2020   Procedure: ANAL EXAM UNDER ANESTHESIA, FISTULOTOMY, SKIN TAG EXCISION;  Surgeon: Leighton Ruff, MD;  Location: [facility];  Service: General;  Laterality: N/A;    There were no vitals filed for this visit.   Subjective Assessment - 04/22/20 1651    Subjective Patient went to a new doctor who is checking several things. Overall she feels like it is about the same. She had some pain after the last treatment.    Pertinent History TMJ, migranes,    Limitations Sitting    How long can you stand comfortably? standing is OK    How long can you walk comfortably? no real diffulty    Diagnostic tests X-rays showed good fusion    Patient Stated Goals Less pain/tightness    Currently in Pain? No/denies    Pain Score 5     Pain Location Neck    Pain Orientation Right;Left    Pain Descriptors / Indicators Aching    Pain Type Chronic pain  Pain Radiating Towards burning in the shoulder blades    Pain Onset Today    Pain Frequency Constant    Aggravating Factors  sue of the arm    Pain Relieving Factors rest    Effect of Pain on Daily Activities difficulty working                             [facility] Adult PT Treatment/Exercise - 04/23/20 0001      Neck Exercises: Machines for Strengthening   UBE (Upper Arm Bike) 2 min forward 2 min back     Cybex Row 15 lbs 2x10     Lat Pull standing 15lbs 2x10       Neck Exercises: Standing   Other Standing Exercises Rows 2x10 green band; standing flexion to 90 1lb x15; scaption to 90 1lb x15     Other Standing Exercises Shoulder Extension 2x10 green band      Moist Heat Therapy    Number Minutes Moist Heat 15 Minutes    Moist Heat Location Cervical      Electrical Stimulation   Electrical Stimulation Location cervical spine     Electrical Stimulation Action IFC     Electrical Stimulation Parameters to tolerance     Electrical Stimulation Goals Pain      Manual Therapy   Manual therapy comments IASTM to L upper trap    Soft tissue mobilization trigger point release to upper traps; IASTM to upper traps    Manual Traction sub-occipital release                   PT Education - 04/22/20 1654    Education Details reviewed exerciss for home    Person(s) Educated Patient    Methods Explanation;Demonstration;Tactile cues;Verbal cues    Comprehension Verbalized understanding;Returned demonstration;Tactile cues required;Verbal cues required            PT Short Term Goals - 01/16/20 1433      PT SHORT TERM GOAL #1   Title Patient will be indepdent with relaxation and breathing techniques to reduce actue inflamtion.    Baseline independent    Period Weeks    Status Achieved    Target Date 11/08/19      PT SHORT TERM GOAL #2   Title Patient will report no radiating pain down both of her amrs    Baseline continues to have nbumbness, pain has improved    Time 3    Period Weeks    Status On-going    Target Date 02/06/20      PT SHORT TERM GOAL #3   Title Patient will increase gross bilateral UE strength to 4+/5    Baseline right 4+/5 left still 4/5    Time 3    Period Weeks    Status Partially Met    Target Date 02/06/20             PT Long Term Goals - 04/09/20 0730      PT LONG TERM GOAL #1   Title Patient will demonstrate 60 degrees of passive rotation ROM bilateral without pain    Baseline has had reduced motion to the left to 35 degrees but has not attened therapy for 2  months    Time 6    Period Weeks    Status On-going      PT LONG TERM GOAL #2   Title Patient will sit at her desk for 4  hours without pain    Baseline can sit  for about 2-3 hours before she has pain    Time 6    Period Weeks    Status On-going      PT LONG TERM GOAL #3   Title Patient will reach overhead to a cabinet without pain    Baseline can perfrom with her right hand but continues to have numbness and decreased motor control on the left    Time 6    Period Weeks    Status On-going      PT LONG TERM GOAL #4   Title increase FOTO score to </= 35% limited to demo improvement in function     Baseline FOTO deffered 2nd to Medicaid      PT LONG TERM GOAL #5   Title pt to be I with all HEP given as of last visit to maintain and progress current level of function     Baseline continueing to progress exercies    Time 6    Period Weeks    Status On-going                 Plan - 04/23/20 1446    Clinical Impression Statement Patient continues to tolerate ther-ex while she is here but overall she is showing very little carryover. She will continue to see her MD to try to figureout what is going on. Therapy had her try light gym equipment today and perfrom light dumbbell exercises. she found them challenging but did not have dsignificant pain. Therapy will review final HEP next visit then likley D/C to HEP.    Personal Factors and Comorbidities Comorbidity 1;Comorbidity 2;Profession    Comorbidities history of cervical spine pain, TMJ    Examination-Activity Limitations Lift;Reach Overhead;Locomotion Level    Examination-Participation Restrictions Church;Cleaning;Community Activity;Laundry;Meal Prep    Stability/Clinical Decision Making Evolving/Moderate complexity    Rehab Potential Good    PT Frequency 1x / week    PT Duration 6 weeks    PT Treatment/Interventions ADLs/Self Care Home Management;Cryotherapy;Electrical Stimulation;Iontophoresis 55m/ml Dexamethasone;Moist Heat;Ultrasound;Traction;DME Instruction;Gait training;Stair training;Functional mobility training;Therapeutic activities;Therapeutic exercise;Neuromuscular  re-education;Patient/family education;Manual techniques;Passive range of motion;Dry needling;Taping    PT Next Visit Plan shoulder and posterior chain strengthening, continue manual on upper traps    PT Home Exercise Plan t-band extensin , scpation , bilateral er,           Patient will benefit from skilled therapeutic intervention in order to improve the following deficits and impairments:  Pain, Increased muscle spasms, Decreased activity tolerance, Decreased strength, Decreased range of motion, Decreased endurance, Increased fascial restricitons  Visit Diagnosis: Cervicalgia  Other muscle spasm  Acute pain of left shoulder  Stiffness of left shoulder, not elsewhere classified     Problem List Patient Active Problem List   Diagnosis Date Noted  . Stressful life event affecting family 07/12/2019  . Caregiver burden 07/12/2019  . Tobacco abuse counseling 07/12/2019  . Protrusion of cervical intervertebral disc 04/10/2019  . Spinal stenosis of cervical region 04/10/2019  . Overactive bladder 11/19/2018  . Photosensitization due to sun 11/02/2018  . Generalized pain 11/02/2018  . High risk medication use 11/02/2018  . Foot pain, bilateral 05/10/2018  . Heel pain, bilateral 05/10/2018  . Plantar fasciitis 05/10/2018  . Left knee pain 05/10/2018  . Patellofemoral pain syndrome of both knees 05/10/2018  . Generalized abdominal pain 04/17/2018  . Flank pain 04/17/2018  . Acute back pain 04/17/2018  . History of renal stone 04/17/2018  .  Urinary frequency 04/17/2018  . Laceration of left hand 01/31/2018  . Decreased range of motion of finger of left hand 01/31/2018  . Localized swelling on left hand 01/31/2018  . Chronic migraine without aura without status migrainosus, not intractable 11/01/2016  . History of migraine 06/10/2016  . Diarrhea 05/20/2016  . Pain in the chest 05/11/2016  . Dizziness and giddiness 05/11/2016  . History of hypertension 05/11/2016  .  Hemorrhoidal skin tag 04/07/2015  . IUD (intrauterine device) in place 05/03/2013  . Allergic rhinitis 01/11/2013  . Current smoker 01/11/2013  . Weight gain 06/26/2012  . Cervical stenosis (uterine cervix) 04/25/2012  . Anticardiolipin antibody positive   . Hyperlipidemia   . Hypertension   . History of trichomoniasis 01/18/2011    Class: History of  . THYROID NODULE, RIGHT 02/05/2008  . GERD 02/05/2008  . NECK PAIN 02/05/2008    Carney Living PT DPT  04/23/2020, 2:53 PM  [facility] [address] [place], [place], [address] Phone: [phone]   Fax:  [phone]  Name: Kelly [name] MRN: [id number] Date of Birth: 1966-07-29

## 2020-04-29 ENCOUNTER — Other Ambulatory Visit: Payer: Self-pay

## 2020-04-29 ENCOUNTER — Encounter: Payer: Self-pay | Admitting: Physical Therapy

## 2020-04-29 ENCOUNTER — Ambulatory Visit: Payer: 59 | Admitting: Physical Therapy

## 2020-04-29 DIAGNOSIS — M25612 Stiffness of left shoulder, not elsewhere classified: Secondary | ICD-10-CM

## 2020-04-29 DIAGNOSIS — M25512 Pain in left shoulder: Secondary | ICD-10-CM

## 2020-04-29 DIAGNOSIS — M62838 Other muscle spasm: Secondary | ICD-10-CM

## 2020-04-29 DIAGNOSIS — I10 Essential (primary) hypertension: Secondary | ICD-10-CM | POA: Diagnosis not present

## 2020-04-29 DIAGNOSIS — M542 Cervicalgia: Secondary | ICD-10-CM

## 2020-04-29 DIAGNOSIS — Q761 Klippel-Feil syndrome: Secondary | ICD-10-CM | POA: Diagnosis not present

## 2020-04-29 DIAGNOSIS — Z6829 Body mass index (BMI) 29.0-29.9, adult: Secondary | ICD-10-CM | POA: Diagnosis not present

## 2020-04-29 MED FILL — PREGABALIN 100 MG CAPS: 100 | 30 days supply | Qty: 60 | Fill #0

## 2020-04-30 ENCOUNTER — Encounter: Payer: Self-pay | Admitting: Physical Therapy

## 2020-04-30 NOTE — Therapy (Signed)
[place], [place], [address] Phone: [phone]   Fax:  [phone]  Physical Therapy Treatment/Discharge   Patient Details  Name: Kelly [name] MRN: [id number] Date of Birth: [birth year] Referring Provider (PT): Dr Sherley Bounds    Encounter Date: 04/29/2020   PT End of Session - 04/30/20 1333    Visit Number 5    Number of Visits 7    Date for PT Re-Evaluation 04/27/20    Authorization Type [employer]    PT Start Time 0430    PT Stop Time 0510    PT Time Calculation (min) 40 min    Activity Tolerance Patient tolerated treatment well    Behavior During Therapy [facility] for tasks assessed/performed           Past Medical History:  Diagnosis Date  . Anal fistula    intersphincteric   . Anemia   . Anticardiolipin antibody positive   . Cervicalgia    followed by dr Sherley Bounds  . Depression   . History of herpes genitalis    many yrs ago  . History of kidney stones   . Hyperlipidemia   . Hypertension    followed by pcp   (11-25-2019  per pt had stress test approx. 2018 w/ Dr Wynonia Lawman, told normal)  . Migraines    neurologist--- dr Jaynee Eagles  . OSA on CPAP   . TMJ (dislocation of temporomandibular joint)    11-25-2019  jaw pops, no appliance  . Wears glasses     Past Surgical History:  Procedure Laterality Date  . ANTERIOR CERVICAL DECOMP/DISCECTOMY FUSION  12/ 2020   [facility]   C4 - 6  . CESAREAN SECTION     X3   LAST ONE 11-30-2004  . CYSTOSCOPY W/ RETROGRADES Right 10/29/2012   Procedure: CYSTOSCOPY WITH RETROGRADE PYELOGRAM and stent placement;  Surgeon: Reece Packer, MD;  Location: [facility];  Service: Urology;  Laterality: Right;  rt stent placement , rt retrograde and cysto   . CYSTOSCOPY W/ URETERAL STENT PLACEMENT Right 11/23/2012   Procedure: CYSTOSCOPY WITH STENT REPLACEMENT;  Surgeon: Alexis Frock, MD;  Location[facility];   Service: Urology;  Laterality: Right;  . CYSTOSCOPY/RETROGRADE/URETEROSCOPY/STONE EXTRACTION WITH BASKET Right 11/23/2012   Procedure: CYSTOSCOPY/RETROGRADE/URETEROSCOPY/STONE EXTRACTION WITH BASKET;  Surgeon: Alexis Frock, MD;  Location: [facility]  Service: Urology;  Laterality: Right;  . EVALUATION UNDER ANESTHESIA WITH ANAL FISSUROTOMY N/A 01/23/2020   Procedure: ANAL EXAM UNDER ANESTHESIA, FISTULOTOMY, SKIN TAG EXCISION;  Surgeon: Leighton Ruff, MD;  Location: [facility];  Service: General;  Laterality: N/A;    There were no vitals filed for this visit.   Subjective Assessment - 04/30/20 1330    Subjective Patient went tubing and had minimal pain. She continues to have pain when performing work tasks for long periods of time.    Pertinent History TMJ, migranes,    Limitations Sitting    How long can you stand comfortably? standing is OK    How long can you walk comfortably? no real diffulty    Diagnostic tests X-rays showed good fusion    Patient Stated Goals Less pain/tightness    Currently in Pain? No/denies    Multiple Pain Sites No              [facility] PT Assessment - 04/30/20 0001      Assessment   Medical Diagnosis Neck Pain  Referring Provider (PT) Dr Sherley Bounds       AROM   Cervical Flexion 18    Cervical Extension 30    Cervical - Right Rotation 50    Cervical - Left Rotation 50      Strength   Right Shoulder Flexion 4+/5    Right Shoulder Internal Rotation 4+/5    Right Shoulder External Rotation 4+/5    Left Shoulder Flexion 4/5    Left Shoulder Internal Rotation 4/5    Left Shoulder External Rotation 4/5                         [facility] Adult PT Treatment/Exercise - 04/30/20 0001      Self-Care   Other Self-Care Comments  reviewed progreession of exercises at home. Reviewed self soft tissue mobilization; reviewed tens and where to find       Neck Exercises: Machines for Strengthening   UBE (Upper Arm Bike) 2 min forward 2  min back     Cybex Row 15 lbs 2x10     Lat Pull standing 15lbs 2x10       Neck Exercises: Standing   Other Standing Exercises Rows 2x10 green band; standing flexion to 90 1lb x15; scaption to 90 1lb x15     Other Standing Exercises Shoulder Extension 2x10 green band      Neck Exercises: Supine   Shoulder ABduction 15 reps    Other Supine Exercise bilateral ER red 2x10; horizontal abduction 10 red;        Lumbar Exercises: Aerobic   Nustep L4 UE/LE 5 min      Lumbar Exercises: Standing   Other Standing Lumbar Exercises stamding T and standing Y 2x10 1lb weight       Manual Therapy   Manual therapy comments IASTM to L upper trap    Soft tissue mobilization trigger point release to upper traps; IASTM to upper traps    Manual Traction sub-occipital release                   PT Education - 04/30/20 1331    Education Details HEP and symptom    Person(s) Educated Patient    Methods Explanation;Demonstration;Tactile cues;Verbal cues    Comprehension Verbalized understanding;Returned demonstration;Verbal cues required;Tactile cues required            PT Short Term Goals - 04/30/20 1340      PT SHORT TERM GOAL #1   Title Patient will be indepdent with relaxation and breathing techniques to reduce actue inflamtion.    Baseline independent    Time 3    Period Weeks    Status Achieved    Target Date 11/08/19      PT SHORT TERM GOAL #2   Title Patient will report no radiating pain down both of her amrs    Baseline continues to have nbumbness, pain has improved    Time 3    Period Weeks    Status Not Met    Target Date 02/06/20      PT SHORT TERM GOAL #3   Title Patient will increase gross bilateral UE strength to 4+/5    Baseline right 4+/5 left still 4/5    Time 3    Period Weeks    Status Achieved    Target Date 02/06/20             PT Long Term Goals - 04/30/20 1340  PT LONG TERM GOAL #1   Title Patient will demonstrate 60 degrees of passive  rotation ROM bilateral without pain    Baseline 50 degrees bilateral    Time 6    Period Weeks    Status Not Met      PT LONG TERM GOAL #2   Title Patient will sit at her desk for 4 hours without pain    Baseline can perfrom work but has pain    Time 6    Period Weeks    Status Not Met      PT LONG TERM GOAL #3   Title Patient will reach overhead to a cabinet without pain    Baseline can do with right but not left    Time 6    Period Weeks    Status Not Met      PT LONG TERM GOAL #4   Title increase FOTO score to </= 35% limited to demo improvement in function     Baseline not given    Time 6    Period Weeks    Status Achieved      PT LONG TERM GOAL #5   Title pt to be I with all HEP given as of last visit to maintain and progress current level of function     Baseline continueing to progress exercies    Time 6    Period Weeks    Status On-going                 Plan - 04/30/20 1334    Clinical Impression Statement Patient has platued in progress with PT. She is able to complete alle exercises on her own at home. She continues to have significant control issues in her left UE with overhead activity. She has been perfroming her daily tasks but frequently has numbness and a cold feeling in her hand. She is seeing a neurologist. Throughout her treatment she has not had any significant negative reactions to exercises butshe hasn't had any benefit either. Therapy has slowly progressed her activity. She will continue on her own. Therapy reviewed where to get a tens unit.    Personal Factors and Comorbidities Comorbidity 1;Comorbidity 2;Profession    Comorbidities history of cervical spine pain, TMJ    Examination-Activity Limitations Lift;Reach Overhead;Locomotion Level    Examination-Participation Restrictions Church;Cleaning;Community Activity;Laundry;Meal Prep    Stability/Clinical Decision Making Evolving/Moderate complexity    Clinical Decision Making Moderate     Rehab Potential Good    PT Frequency 1x / week    PT Duration 6 weeks    PT Treatment/Interventions ADLs/Self Care Home Management;Cryotherapy;Electrical Stimulation;Iontophoresis 4mg /ml Dexamethasone;Moist Heat;Ultrasound;Traction;DME Instruction;Gait training;Stair training;Functional mobility training;Therapeutic activities;Therapeutic exercise;Neuromuscular re-education;Patient/family education;Manual techniques;Passive range of motion;Dry needling;Taping    PT Next Visit Plan shoulder and posterior chain strengthening, continue manual on upper traps    PT Home Exercise Plan t-band extensin , scpation , bilateral er,    Consulted and Agree with Plan of Care Patient           Patient will benefit from skilled therapeutic intervention in order to improve the following deficits and impairments:  Pain, Increased muscle spasms, Decreased activity tolerance, Decreased strength, Decreased range of motion, Decreased endurance, Increased fascial restricitons  Visit Diagnosis: Cervicalgia  Other muscle spasm  Acute pain of left shoulder  Stiffness of left shoulder, not elsewhere classified  PHYSICAL THERAPY DISCHARGE SUMMARY  Visits from Start of Care: 5   Current functional level related to goals / functional outcomes:  Patient is back to work.   Remaining deficits: Pain, numbness, and coldness in her hand with all functional activity   Education / Equipment:  HEP  Plan: Patient agrees to discharge.  Patient goals were partially met. Patient is being discharged due to lack of progress.  ?????       Problem List Patient Active Problem List   Diagnosis Date Noted  . Stressful life event affecting family 07/12/2019  . Caregiver burden 07/12/2019  . Tobacco abuse counseling 07/12/2019  . Protrusion of cervical intervertebral disc 04/10/2019  . Spinal stenosis of cervical region 04/10/2019  . Overactive bladder 11/19/2018  . Photosensitization due to sun 11/02/2018  .  Generalized pain 11/02/2018  . High risk medication use 11/02/2018  . Foot pain, bilateral 05/10/2018  . Heel pain, bilateral 05/10/2018  . Plantar fasciitis 05/10/2018  . Left knee pain 05/10/2018  . Patellofemoral pain syndrome of both knees 05/10/2018  . Generalized abdominal pain 04/17/2018  . Flank pain 04/17/2018  . Acute back pain 04/17/2018  . History of renal stone 04/17/2018  . Urinary frequency 04/17/2018  . Laceration of left hand 01/31/2018  . Decreased range of motion of finger of left hand 01/31/2018  . Localized swelling on left hand 01/31/2018  . Chronic migraine without aura without status migrainosus, not intractable 11/01/2016  . History of migraine 06/10/2016  . Diarrhea 05/20/2016  . Pain in the chest 05/11/2016  . Dizziness and giddiness 05/11/2016  . History of hypertension 05/11/2016  . Hemorrhoidal skin tag 04/07/2015  . IUD (intrauterine device) in place 05/03/2013  . Allergic rhinitis 01/11/2013  . Current smoker 01/11/2013  . Weight gain 06/26/2012  . Cervical stenosis (uterine cervix) 04/25/2012  . Anticardiolipin antibody positive   . Hyperlipidemia   . Hypertension   . History of trichomoniasis 01/18/2011    Class: History of  . THYROID NODULE, RIGHT 02/05/2008  . GERD 02/05/2008  . NECK PAIN 02/05/2008    Carney Living PT DPT  04/30/2020, 1:52 PM  [facility] [address] [place], [place], [address] Phone: [phone]   Fax:  [phone]  Name: Kelly [name] MRN: [id number] Date of Birth: 12/14/1965

## 2020-05-06 MED FILL — OXYCODONE-APAP 5-325MG: 5-325 | 30 days supply | Qty: 90 | Fill #0

## 2020-05-07 ENCOUNTER — Ambulatory Visit: Payer: 59 | Admitting: Family Medicine

## 2020-05-07 LAB — VITAMIN B12: Vitamin B-12: 408 pg/mL (ref 232–1245)

## 2020-05-07 LAB — SPECIMEN STATUS REPORT

## 2020-05-08 MED FILL — CYCLOBENZAPRINE HCL 5 MG TA: 5 | 20 days supply | Qty: 60 | Fill #1

## 2020-05-12 ENCOUNTER — Other Ambulatory Visit: Payer: Self-pay | Admitting: Family Medicine

## 2020-05-12 ENCOUNTER — Ambulatory Visit (INDEPENDENT_AMBULATORY_CARE_PROVIDER_SITE_OTHER): Payer: 59 | Admitting: Family Medicine

## 2020-05-12 ENCOUNTER — Encounter: Payer: Self-pay | Admitting: Family Medicine

## 2020-05-12 VITALS — BP 136/89 | HR 89 | Ht 64.0 in | Wt 173.0 lb

## 2020-05-12 DIAGNOSIS — G43709 Chronic migraine without aura, not intractable, without status migrainosus: Secondary | ICD-10-CM | POA: Diagnosis not present

## 2020-05-12 MED ORDER — AJOVY 225 MG/1.5ML ~~LOC~~ SOAJ: 225.0000 mg | SUBCUTANEOUS | 11 refills | Status: DC

## 2020-05-12 MED ORDER — NURTEC 75 MG PO TBDP: 75.0000 mg | ORAL_TABLET | Freq: Every day | ORAL | 11 refills | Status: DC | PRN

## 2020-05-12 MED FILL — NURTEC 75 MG TBDP: 75 | 30 days supply | Qty: 8 | Fill #0

## 2020-05-12 MED FILL — AJOVY 225 MG/1.5ML SOAJ: 225 | 30 days supply | Qty: 2 | Fill #0

## 2020-05-12 NOTE — Patient Instructions (Signed)
We will start Ajovy injections every 30 days as directed. I will aslo start Nurtec for abortive therapy.   Continue follow up with PCP for concerns of tremor and speech. Consider topiramate as possible culprit in word finding difficulty, although, you are on a really low dose.   Please make sure you are drinking plenty of water, getting 6-8 hours of sleep and exercising regularly.   Follow up in 6 months, sooner if needed    Rimegepant oral dissolving tablet What is this medicine? RIMEGEPANT (ri ME je pant) is used to treat migraine headaches with or without aura. An aura is a strange feeling or visual disturbance that warns you of an attack. It is not used to prevent migraines. This medicine may be used for other purposes; ask your health care provider or pharmacist if you have questions. COMMON BRAND NAME(S): NURTEC ODT What should I tell my health care provider before I take this medicine? They need to know if you have any of these conditions:  kidney disease  liver disease  an unusual or allergic reaction to rimegepant, other medicines, foods, dyes, or preservatives  pregnant or trying to get pregnant  breast-feeding How should I use this medicine? Take the medicine by mouth. Follow the directions on the prescription label. Leave the tablet in the sealed blister pack until you are ready to take it. With dry hands, open the blister and gently remove the tablet. If the tablet breaks or crumbles, throw it away and take a new tablet out of the blister pack. Place the tablet in the mouth and allow it to dissolve, and then swallow. Do not cut, crush, or chew this medicine. You do not need water to take this medicine. Talk to your pediatrician about the use of this medicine in children. Special care may be needed. Overdosage: If you think you have taken too much of this medicine contact a poison control center or emergency room at once. NOTE: This medicine is only for you. Do not share  this medicine with others. What if I miss a dose? This does not apply. This medicine is not for regular use. What may interact with this medicine? This medicine may interact with the following medications:  certain medicines for fungal infections like fluconazole, itraconazole  rifampin This list may not describe all possible interactions. Give your health care provider a list of all the medicines, herbs, non-prescription drugs, or dietary supplements you use. Also tell them if you smoke, drink alcohol, or use illegal drugs. Some items may interact with your medicine. What should I watch for while using this medicine? Visit your health care professional for regular checks on your progress. Tell your health care professional if your symptoms do not start to get better or if they get worse. What side effects may I notice from receiving this medicine? Side effects that you should report to your doctor or health care professional as soon as possible:  allergic reactions like skin rash, itching or hives; swelling of the face, lips, or tongue Side effects that usually do not require medical attention (report these to your doctor or health care professional if they continue or are bothersome):  nausea This list may not describe all possible side effects. Call your doctor for medical advice about side effects. You may report side effects to FDA at 1-800-FDA-1088. Where should I keep my medicine? Keep out of the reach of children. Store at room temperature between 15 and 30 degrees C (59 and 86 degrees  F). Throw away any unused medicine after the expiration date. NOTE: This sheet is a summary. It may not cover all possible information. If you have questions about this medicine, talk to your doctor, pharmacist, or health care provider.  2020 Elsevier/Gold Standard (2018-10-22 00:21:31)   Rolanda Lundborg injection What is this medicine? FREMANEZUMAB (fre ma NEZ ue mab) is used to prevent migraine  headaches. This medicine may be used for other purposes; ask your health care provider or pharmacist if you have questions. COMMON BRAND NAME(S): AJOVY What should I tell my health care provider before I take this medicine? They need to know if you have any of these conditions:  an unusual or allergic reaction to fremanezumab, other medicines, foods, dyes, or preservatives  pregnant or trying to get pregnant  breast-feeding How should I use this medicine? This medicine is for injection under the skin. You will be taught how to prepare and give this medicine. Use exactly as directed. Take your medicine at regular intervals. Do not take your medicine more often than directed. It is important that you put your used needles and syringes in a special sharps container. Do not put them in a trash can. If you do not have a sharps container, call your pharmacist or healthcare provider to get one. Talk to your pediatrician regarding the use of this medicine in children. Special care may be needed. Overdosage: If you think you have taken too much of this medicine contact a poison control center or emergency room at once. NOTE: This medicine is only for you. Do not share this medicine with others. What if I miss a dose? If you miss a dose, take it as soon as you can. If it is almost time for your next dose, take only that dose. Do not take double or extra doses. What may interact with this medicine? Interactions are not expected. This list may not describe all possible interactions. Give your health care provider a list of all the medicines, herbs, non-prescription drugs, or dietary supplements you use. Also tell them if you smoke, drink alcohol, or use illegal drugs. Some items may interact with your medicine. What should I watch for while using this medicine? Tell your doctor or healthcare professional if your symptoms do not start to get better or if they get worse. What side effects may I notice from  receiving this medicine? Side effects that you should report to your doctor or health care professional as soon as possible:  allergic reactions like skin rash, itching or hives, swelling of the face, lips, or tongue Side effects that usually do not require medical attention (report these to your doctor or health care professional if they continue or are bothersome):  pain, redness, or irritation at site where injected This list may not describe all possible side effects. Call your doctor for medical advice about side effects. You may report side effects to FDA at 1-800-FDA-1088. Where should I keep my medicine? Keep out of the reach of children. You will be instructed on how to store this medicine. Throw away any unused medicine after the expiration date on the label. NOTE: This sheet is a summary. It may not cover all possible information. If you have questions about this medicine, talk to your doctor, pharmacist, or health care provider.  2020 Elsevier/Gold Standard (2017-05-08 17:22:56)   Migraine Headache A migraine headache is a very strong throbbing pain on one side or both sides of your head. This type of headache can also cause  other symptoms. It can last from 4 hours to 3 days. Talk with your doctor about what things may bring on (trigger) this condition. What are the causes? The exact cause of this condition is not known. This condition may be triggered or caused by:  Drinking alcohol.  Smoking.  Taking medicines, such as: ? Medicine used to treat chest pain (nitroglycerin). ? Birth control pills. ? Estrogen. ? Some blood pressure medicines.  Eating or drinking certain products.  Doing physical activity. Other things that may trigger a migraine headache include:  Having a menstrual period.  Pregnancy.  Hunger.  Stress.  Not getting enough sleep or getting too much sleep.  Weather changes.  Tiredness (fatigue). What increases the risk?  Being 12-55 years  old.  Being female.  Having a family history of migraine headaches.  Being Caucasian.  Having depression or anxiety.  Being very overweight. What are the signs or symptoms?  A throbbing pain. This pain may: ? Happen in any area of the head, such as on one side or both sides. ? Make it hard to do daily activities. ? Get worse with physical activity. ? Get worse around bright lights or loud noises.  Other symptoms may include: ? Feeling sick to your stomach (nauseous). ? Vomiting. ? Dizziness. ? Being sensitive to bright lights, loud noises, or smells.  Before you get a migraine headache, you may get warning signs (an aura). An aura may include: ? Seeing flashing lights or having blind spots. ? Seeing bright spots, halos, or zigzag lines. ? Having tunnel vision or blurred vision. ? Having numbness or a tingling feeling. ? Having trouble talking. ? Having weak muscles.  Some people have symptoms after a migraine headache (postdromal phase), such as: ? Tiredness. ? Trouble thinking (concentrating). How is this treated?  Taking medicines that: ? Relieve pain. ? Relieve the feeling of being sick to your stomach. ? Prevent migraine headaches.  Treatment may also include: ? Having acupuncture. ? Avoiding foods that bring on migraine headaches. ? Learning ways to control your body functions (biofeedback). ? Therapy to help you know and deal with negative thoughts (cognitive behavioral therapy). Follow these instructions at home: Medicines  Take over-the-counter and prescription medicines only as told by your doctor.  Ask your doctor if the medicine prescribed to you: ? Requires you to avoid driving or using heavy machinery. ? Can cause trouble pooping (constipation). You may need to take these steps to prevent or treat trouble pooping:  Drink enough fluid to keep your pee (urine) pale yellow.  Take over-the-counter or prescription medicines.  Eat foods that are  high in fiber. These include beans, whole grains, and fresh fruits and vegetables.  Limit foods that are high in fat and sugar. These include fried or sweet foods. Lifestyle  Do not drink alcohol.  Do not use any products that contain nicotine or tobacco, such as cigarettes, e-cigarettes, and chewing tobacco. If you need help quitting, ask your doctor.  Get at least 8 hours of sleep every night.  Limit and deal with stress. General instructions      Keep a journal to find out what may bring on your migraine headaches. For example, write down: ? What you eat and drink. ? How much sleep you get. ? Any change in what you eat or drink. ? Any change in your medicines.  If you have a migraine headache: ? Avoid things that make your symptoms worse, such as bright lights. ? It may  help to lie down in a dark, quiet room. ? Do not drive or use heavy machinery. ? Ask your doctor what activities are safe for you.  Keep all follow-up visits as told by your doctor. This is important. Contact a doctor if:  You get a migraine headache that is different or worse than others you have had.  You have more than 15 headache days in one month. Get help right away if:  Your migraine headache gets very bad.  Your migraine headache lasts longer than 72 hours.  You have a fever.  You have a stiff neck.  You have trouble seeing.  Your muscles feel weak or like you cannot control them.  You start to lose your balance a lot.  You start to have trouble walking.  You pass out (faint).  You have a seizure. Summary  A migraine headache is a very strong throbbing pain on one side or both sides of your head. These headaches can also cause other symptoms.  This condition may be treated with medicines and changes to your lifestyle.  Keep a journal to find out what may bring on your migraine headaches.  Contact a doctor if you get a migraine headache that is different or worse than others  you have had.  Contact your doctor if you have more than 15 headache days in a month. This information is not intended to replace advice given to you by your health care provider. Make sure you discuss any questions you have with your health care provider. Document Revised: 11/30/2018 Document Reviewed: 09/20/2018 Elsevier Patient Education  [place].
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
## 2020-05-13 ENCOUNTER — Telehealth: Payer: Self-pay | Admitting: Family Medicine

## 2020-05-13 DIAGNOSIS — G4733 Obstructive sleep apnea (adult) (pediatric): Secondary | ICD-10-CM | POA: Diagnosis not present

## 2020-05-13 NOTE — Telephone Encounter (Signed)
PA request for Nurtec was approved 05/13/20 to 11/09/20.   PA was also received for Ajovy via [email or web address]. Key is [id number]. Will check back for a determination.

## 2020-05-13 NOTE — Telephone Encounter (Signed)
Received a PA request for Nurtec. PA was started on [email or web address]. Key is [id number]. Will check back later for a determination.

## 2020-05-21 ENCOUNTER — Other Ambulatory Visit: Payer: Self-pay | Admitting: Obstetrics & Gynecology

## 2020-05-26 ENCOUNTER — Other Ambulatory Visit: Payer: Self-pay | Admitting: Obstetrics & Gynecology

## 2020-05-26 ENCOUNTER — Encounter: Payer: Self-pay | Admitting: Family Medicine

## 2020-05-26 ENCOUNTER — Other Ambulatory Visit: Payer: Self-pay | Admitting: Medical

## 2020-05-26 MED ORDER — ACYCLOVIR 5 % EX OINT: 1.0000 "application " | TOPICAL_OINTMENT | CUTANEOUS | 3 refills | Status: DC

## 2020-05-26 MED ORDER — ACYCLOVIR 5 % EX CREA: 1.0000 "application " | TOPICAL_CREAM | CUTANEOUS | 3 refills | Status: DC

## 2020-05-26 MED FILL — ACYCLOVIR 5% OINTMENT: 5 | 10 days supply | Qty: 15 | Fill #0

## 2020-05-26 MED FILL — METOPROLOL TARTRATE 50 MG T: 50 | 90 days supply | Qty: 180 | Fill #0

## 2020-05-26 MED FILL — CYCLOBENZAPRINE HCL 5 MG TA: 5 | 20 days supply | Qty: 60 | Fill #0

## 2020-05-26 NOTE — Addendum Note (Signed)
Addended by: Ramond Craver on: 05/26/2020 04:42 PM   Modules accepted: Orders

## 2020-05-27 ENCOUNTER — Other Ambulatory Visit: Payer: Self-pay | Admitting: Family Medicine

## 2020-05-27 MED ORDER — TOPIRAMATE 50 MG PO TABS: 50.0000 mg | ORAL_TABLET | Freq: Two times a day (BID) | ORAL | 3 refills | Status: DC

## 2020-05-27 MED FILL — TOPIRAMATE 50 MG TABLET: 50 | 90 days supply | Qty: 180 | Fill #0

## 2020-06-01 MED FILL — PREGABALIN 100 MG CAPS: 100 | 30 days supply | Qty: 60 | Fill #1

## 2020-06-01 MED FILL — NURTEC 75 MG TBDP: 75 | 30 days supply | Qty: 8 | Fill #1

## 2020-06-04 MED FILL — OXYCODONE-APAP 5-325MG: 5-325 | 30 days supply | Qty: 90 | Fill #0

## 2020-06-18 ENCOUNTER — Telehealth: Payer: Self-pay | Admitting: Family Medicine

## 2020-06-18 NOTE — Telephone Encounter (Signed)
Phone rep worked Nurse, mental health @ [place] for [employer] is asking to be called at [phone] xt [phone] for assistance in keeping pt in the program as it relates to the cost being covered for Nurtec.  Please call

## 2020-06-19 DIAGNOSIS — R0789 Other chest pain: Secondary | ICD-10-CM
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
## 2020-06-22 NOTE — Telephone Encounter (Signed)
I called and spoke to [facility], she was asking about PA determination.  I relayed PA request for Nurtec was approved 05/13/20 to 11/09/20..  She stated understanding and appreciation and will close case.

## 2020-06-23 ENCOUNTER — Encounter: Payer: Self-pay | Admitting: Cardiovascular Disease

## 2020-06-23 ENCOUNTER — Other Ambulatory Visit: Payer: Self-pay

## 2020-06-23 ENCOUNTER — Ambulatory Visit (INDEPENDENT_AMBULATORY_CARE_PROVIDER_SITE_OTHER): Payer: 59 | Admitting: Cardiovascular Disease

## 2020-06-23 VITALS — BP 130/82 | HR 73 | Ht 64.0 in | Wt 175.8 lb

## 2020-06-23 DIAGNOSIS — E782 Mixed hyperlipidemia: Secondary | ICD-10-CM | POA: Diagnosis not present

## 2020-06-23 DIAGNOSIS — I10 Essential (primary) hypertension: Secondary | ICD-10-CM | POA: Diagnosis not present

## 2020-06-23 DIAGNOSIS — R0789 Other chest pain: Secondary | ICD-10-CM

## 2020-06-23 DIAGNOSIS — R072 Precordial pain: Secondary | ICD-10-CM

## 2020-06-23 DIAGNOSIS — Z720 Tobacco use: Secondary | ICD-10-CM | POA: Diagnosis not present

## 2020-06-23 NOTE — Patient Instructions (Addendum)
Medication Instructions:  Continue same medications *If you need a refill on your cardiac medications before your next appointment, please call your pharmacy*   Lab Work: None ordered   Testing/Procedures: Coronary CT  Will be scheduled after approved by insurance   Follow instructions below   Follow-Up: At [facility], you and your health needs are our priority.  As part of our continuing mission to provide you with exceptional heart care, we have created designated Provider Care Teams.  These Care Teams include your primary Cardiologist (physician) and Advanced Practice Providers (APPs -  Physician Assistants and Nurse Practitioners) who all work together to provide you with the care you need, when you need it.  We recommend signing up for the patient portal called "MyChart".  Sign up information is provided on this After Visit Summary.  MyChart is used to connect with patients for Virtual Visits (Telemedicine).  Patients are able to view lab/test results, encounter notes, upcoming appointments, etc.  Non-urgent messages can be sent to your provider as well.   To learn more about what you can do with MyChart, go to [email or web address].    Your next appointment:  Tuesday  09/23/19 at 4:00 pm   The format for your next appointment: Office   Provider:  Dr.O'Neal      Your cardiac CT will be scheduled at one of the below locations:   [facility] [address] [place], [place] [address] [phone]  [place] [address] [place], [place] [address] [phone]  If scheduled at [facility], please arrive at the [facility] main entrance of [facility] 30 minutes prior to test start time. Proceed to the [facility] Radiology Department (first floor) to check-in and test prep.  If scheduled at [facility], please arrive 15 mins early for check-in and test  prep.  Please follow these instructions carefully (unless otherwise directed):   On the Night Before the Test: . Be sure to Drink plenty of water. . Do not consume any caffeinated/decaffeinated beverages or chocolate 12 hours prior to your test. . Do not take any antihistamines 12 hours prior to your test.   On the Day of the Test: . Drink plenty of water. Do not drink any water within one hour of the test. . Do not eat any food 4 hours prior to the test. . You may take your regular medications prior to the test.  . Take metoprolol 100 mg two hours prior to test.        After the Test: . Drink plenty of water. . After receiving IV contrast, you may experience a mild flushed feeling. This is normal. . On occasion, you may experience a mild rash up to 24 hours after the test. This is not dangerous. If this occurs, you can take Benadryl 25 mg and increase your fluid intake. . If you experience trouble breathing, this can be serious. If it is severe call 911 IMMEDIATELY. If it is mild, please call our office.    Once we have confirmed authorization from your insurance company, we will call you to set up a date and time for your test. Based on how quickly your insurance processes prior authorizations requests, please allow up to 4 weeks to be contacted for scheduling your Cardiac CT appointment. Be advised that routine Cardiac CT appointments could be scheduled as many as 8 weeks after your provider has ordered it.  For  non-scheduling related questions, please contact the cardiac imaging nurse navigator should you have any questions/concerns: Marchia Bond, Cardiac Imaging Nurse Navigator Burley Saver, Interim Cardiac Imaging Nurse [place] and Vascular Services Direct Office Dial: [phone]   For scheduling needs, including cancellations and rescheduling, please call Vivien Rota at [phone], option 3.

## 2020-06-25 DIAGNOSIS — G4733 Obstructive sleep apnea (adult) (pediatric): Secondary | ICD-10-CM | POA: Diagnosis not present

## 2020-06-26 ENCOUNTER — Other Ambulatory Visit: Payer: 59

## 2020-06-26 DIAGNOSIS — Z720 Tobacco use: Secondary | ICD-10-CM | POA: Diagnosis not present

## 2020-06-26 DIAGNOSIS — E782 Mixed hyperlipidemia: Secondary | ICD-10-CM | POA: Diagnosis not present

## 2020-06-26 DIAGNOSIS — R079 Chest pain, unspecified: Secondary | ICD-10-CM

## 2020-06-26 DIAGNOSIS — I10 Essential (primary) hypertension: Secondary | ICD-10-CM

## 2020-06-27 LAB — BASIC METABOLIC PANEL
BUN/Creatinine Ratio: 19 (ref 9–23)
BUN: 14 mg/dL (ref 6–24)
CO2: 24 mmol/L (ref 20–29)
Calcium: 9.9 mg/dL (ref 8.7–10.2)
Chloride: 103 mmol/L (ref 96–106)
Creatinine, Ser: 0.75 mg/dL (ref 0.57–1.00)
GFR calc Af Amer: 105 mL/min/{1.73_m2} (ref >59)
GFR calc non Af Amer: 91 mL/min/{1.73_m2} (ref >59)
Glucose: 100 mg/dL — ABNORMAL HIGH (ref 65–99)
Potassium: 3.9 mmol/L (ref 3.5–5.2)
Sodium: 139 mmol/L (ref 134–144)

## 2020-06-29 NOTE — Telephone Encounter (Signed)
Can I have a charge sheet for this patient please? Patient has [facility] which does not require PA. Her secondary is [facility], I will need to check with them but I am sure patient will be approved.

## 2020-07-02 ENCOUNTER — Telehealth: Payer: Self-pay | Admitting: Family Medicine

## 2020-07-02 ENCOUNTER — Telehealth (HOSPITAL_COMMUNITY): Payer: Self-pay | Admitting: Emergency Medicine

## 2020-07-02 NOTE — Telephone Encounter (Signed)
Calling patient to review CCTA instructions however patient informed me that her appt was moved to another date. Will reach out to her closer to her new appt date.  Marchia Bond RN Navigator Cardiac Imaging [facility] Heart and Vascular Services [phone] Office  [phone] Cell

## 2020-07-02 NOTE — Telephone Encounter (Signed)
I called [facility] and spoke with Eliezer Lofts to see if codes [id number] and [phone] will require PA. Eliezer Lofts states they do not. Reference [phone].   I completed PA form for [facility] and faxed it with clinical notes for requested approval of Botox.

## 2020-07-03 ENCOUNTER — Ambulatory Visit (HOSPITAL_COMMUNITY): Payer: 59

## 2020-07-03 MED FILL — OXYCODONE-APAP 5-325MG: 5-325 | 30 days supply | Qty: 90 | Fill #0

## 2020-07-07 ENCOUNTER — Telehealth (HOSPITAL_COMMUNITY): Payer: Self-pay | Admitting: Emergency Medicine

## 2020-07-07 NOTE — Telephone Encounter (Signed)
Reaching out to patient to offer assistance regarding upcoming cardiac imaging study; pt verbalizes understanding of appt date/time, parking situation and where to check in, pre-test NPO status and medications ordered, and verified current allergies; name and call back number provided for further questions should they arise Marchia Bond RN [place] and Vascular [phone] office [phone] cell   Pt verbalized understanding to take 100mg  metoprolol 2 hr prior to scan

## 2020-07-08 ENCOUNTER — Ambulatory Visit (HOSPITAL_COMMUNITY)
Admission: RE | Admit: 2020-07-08 | Discharge: 2020-07-08 | Disposition: A | Discharge status: Home / Self Care | Payer: 59 | Source: Ambulatory Visit | Attending: Cardiovascular Disease | Admitting: Cardiovascular Disease

## 2020-07-08 ENCOUNTER — Other Ambulatory Visit: Payer: Self-pay

## 2020-07-08 ENCOUNTER — Encounter (HOSPITAL_COMMUNITY): Payer: Self-pay

## 2020-07-08 ENCOUNTER — Encounter: Payer: Self-pay | Admitting: *Deleted

## 2020-07-08 DIAGNOSIS — Z006 Encounter for examination for normal comparison and control in clinical research program: Secondary | ICD-10-CM

## 2020-07-08 DIAGNOSIS — R072 Precordial pain: Secondary | ICD-10-CM | POA: Insufficient documentation

## 2020-07-08 MED ORDER — NITROGLYCERIN 0.4 MG SL SUBL: 0.8000 mg | SUBLINGUAL_TABLET | Freq: Once | SUBLINGUAL | Status: AC

## 2020-07-08 MED ORDER — IOHEXOL 350 MG/ML SOLN
80.0000 mL | Freq: Once | INTRAVENOUS | Status: AC | PRN
  Administered 2020-07-08: 80 mL via INTRAVENOUS

## 2020-07-08 MED ORDER — NITROGLYCERIN 0.4 MG SL SUBL
SUBLINGUAL_TABLET | SUBLINGUAL | Status: AC
  Administered 2020-07-08: 0.8 mg via SUBLINGUAL
  Filled 2020-07-08: qty 2

## 2020-07-08 NOTE — Research (Signed)
IDENTIFY Informed Consent                  Subject Name: Kelly [name]     Subject met inclusion and exclusion criteria.  The informed consent form, study requirements and expectations were reviewed with the subject and questions and concerns were addressed prior to the signing of the consent form.  The subject verbalized understanding of the trial requirements.  The subject agreed to participate in the IDENTIFY trial and signed the informed consent.  The informed consent was obtained prior to performance of any protocol-specific procedures for the subject.  A copy of the signed informed consent was given to the subject and a copy was placed in the subject's medical record.   [place] Charlie Seda, Research Assistant  07/08/2020  15:55 p.m.

## 2020-07-08 NOTE — Discharge Instructions (Signed)
Cardiac CT Angiogram A cardiac CT angiogram is a procedure to look at the heart and the area around the heart. It may be done to help find the cause of chest pains or other symptoms of heart disease. During this procedure, a substance called contrast dye is injected into the blood vessels in the area to be checked. A large X-ray machine, called a CT scanner, then takes detailed pictures of the heart and the surrounding area. The procedure is also sometimes called a coronary CT angiogram, coronary artery scanning, or CTA. A cardiac CT angiogram allows the health care provider to see how well blood is flowing to and from the heart. The health care provider will be able to see if there are any problems, such as:  Blockage or narrowing of the coronary arteries in the heart.  Fluid around the heart.  Signs of weakness or disease in the muscles, valves, and tissues of the heart. Tell a health care provider about:  Any allergies you have. This is especially important if you have had a previous allergic reaction to contrast dye.  All medicines you are taking, including vitamins, herbs, eye drops, creams, and over-the-counter medicines.  Any blood disorders you have.  Any surgeries you have had.  Any medical conditions you have.  Whether you are pregnant or may be pregnant.  Any anxiety disorders, chronic pain, or other conditions you have that may increase your stress or prevent you from lying still. What are the risks? Generally, this is a safe procedure. However, problems may occur, including:  Bleeding.  Infection.  Allergic reactions to medicines or dyes.  Damage to other structures or organs.  Kidney damage from the contrast dye that is used.  Increased risk of cancer from radiation exposure. This risk is low. Talk with your health care provider about: ? The risks and benefits of testing. ? How you can receive the lowest dose of radiation. What happens before the  procedure?  Wear comfortable clothing and remove any jewelry, glasses, dentures, and hearing aids.  Follow instructions from your health care provider about eating and drinking. This may include: ? For 12 hours before the procedure -- avoid caffeine. This includes tea, coffee, soda, energy drinks, and diet pills. Drink plenty of water or other fluids that do not have caffeine in them. Being well hydrated can prevent complications. ? For 4-6 hours before the procedure -- stop eating and drinking. The contrast dye can cause nausea, but this is less likely if your stomach is empty.  Ask your health care provider about changing or stopping your regular medicines. This is especially important if you are taking diabetes medicines, blood thinners, or medicines to treat problems with erections (erectile dysfunction). What happens during the procedure?   Hair on your chest may need to be removed so that small sticky patches called electrodes can be placed on your chest. These will transmit information that helps to monitor your heart during the procedure.  An IV will be inserted into one of your veins.  You might be given a medicine to control your heart rate during the procedure. This will help to ensure that good images are obtained.  You will be asked to lie on an exam table. This table will slide in and out of the CT machine during the procedure.  Contrast dye will be injected into the IV. You might feel warm, or you may get a metallic taste in your mouth.  You will be given a medicine called   nitroglycerin. This will relax or dilate the arteries in your heart.  The table that you are lying on will move into the CT machine tunnel for the scan.  The person running the machine will give you instructions while the scans are being done. You may be asked to: ? Keep your arms above your head. ? Hold your breath. ? Stay very still, even if the table is moving.  When the scanning is complete, you  will be moved out of the machine.  The IV will be removed. The procedure may vary among health care providers and hospitals. What can I expect after the procedure? After your procedure, it is common to have:  A metallic taste in your mouth from the contrast dye.  A feeling of warmth.  A headache from the nitroglycerin. Follow these instructions at home:  Take over-the-counter and prescription medicines only as told by your health care provider.  If you are told, drink enough fluid to keep your urine pale yellow. This will help to flush the contrast dye out of your body.  Most people can return to their normal activities right after the procedure. Ask your health care provider what activities are safe for you.  It is up to you to get the results of your procedure. Ask your health care provider, or the department that is doing the procedure, when your results will be ready.  Keep all follow-up visits as told by your health care provider. This is important. Contact a health care provider if:  You have any symptoms of allergy to the contrast dye. These include: ? Shortness of breath. ? Rash or hives. ? A racing heartbeat. Summary  A cardiac CT angiogram is a procedure to look at the heart and the area around the heart. It may be done to help find the cause of chest pains or other symptoms of heart disease.  During this procedure, a large X-ray machine, called a CT scanner, takes detailed pictures of the heart and the surrounding area after a contrast dye has been injected into blood vessels in the area.  Ask your health care provider about changing or stopping your regular medicines before the procedure. This is especially important if you are taking diabetes medicines, blood thinners, or medicines to treat erectile dysfunction.  If you are told, drink enough fluid to keep your urine pale yellow. This will help to flush the contrast dye out of your body. This information is not  intended to replace advice given to you by your health care provider. Make sure you discuss any questions you have with your health care provider. Document Revised: 04/03/2019 Document Reviewed: 04/03/2019 Elsevier Patient Education  2020 Elsevier Inc. Testing With IV Contrast Material IV contrast material is a fluid that is used with some imaging tests. It is injected into your body through a vein. Contrast material is used when your health care providers need a detailed look at organs, tissues, or blood vessels that may not show up with the standard test. The material may be used when an X-ray, an MRI, a CT scan, or an ultrasound is done. IV contrast material may be used for imaging tests that check:  Muscles, skin, and fat.  Breasts.  Brain.  Digestive tract.  Heart.  Organs such as the liver, kidneys, lungs, bladder, and many others.  Arteries and veins. Tell a health care provider about:  Any allergies you have, especially an allergy to contrast material.  All medicines you are taking, including   metformin, beta blockers, NSAIDs (such as ibuprofen), interleukin-2, vitamins, herbs, eye drops, creams, and over-the-counter medicines.  Any problems you or family members have had with the use of contrast material.  Any blood disorders you have, such as sickle cell anemia.  Any surgeries you have had.  Any medical conditions you have or have had, especially alcohol abuse, dehydration, asthma, or kidney, liver, or heart problems.  Whether you are pregnant or may be pregnant.  Whether you are breastfeeding. Most contrast materials are safe for use in breastfeeding women. What are the risks? Generally, this is a safe procedure. However, problems may occur, including:  Headache.  Itching, skin rash, and hives.  Nausea and vomiting.  Allergic reactions.  Wheezing or difficulty breathing.  Abnormal heart rate.  Changes in blood pressure.  Throat swelling.  Kidney  damage. What happens before the procedure? Medicines Ask your health care provider about:  Changing or stopping your regular medicines. This is especially important if you are taking diabetes medicines or blood thinners.  Taking medicines such as aspirin and ibuprofen. These medicines can thin your blood. Do not take these medicines unless your health care provider tells you to take them.  Taking over-the-counter medicines, vitamins, herbs, and supplements. If you are at risk of having a reaction to the IV contrast material, you may be asked to take medicine before the procedure to prevent a reaction. General instructions  Follow instructions from your health care provider about eating or drinking restrictions.  You may have an exam or lab tests to make sure that you can safely get IV contrast material.  Ask if you will be given a medicine to help you relax (sedative) during the procedure. If so, plan to have someone take you home from the hospital or clinic. What happens during the procedure?  You may be given a sedative to help you relax.  An IV will be inserted into one of your veins.  Contrast material will be injected into your IV.  You may feel warmth or flushing as the contrast material enters your bloodstream.  You may have a metallic taste in your mouth for a few minutes.  The needle may cause some discomfort and bruising.  After the contrast material is in your body, the imaging test will be done. The procedure may vary among health care providers and hospitals. What can I expect after the procedure?  The IV will be removed.  You may be taken to a recovery area if sedation medicines were used. Your blood pressure, heart rate, breathing rate, and blood oxygen level will be monitored until you leave the hospital or clinic. Follow these instructions at home:   Take over-the-counter and prescription medicines only as told by your health care provider. ? Your health  care provider may tell you to not take certain medicines for a couple of days after the procedure. This is especially important if you are taking diabetes medicines.  If you are told, drink enough fluid to keep your urine pale yellow. This will help to remove the contrast material out of your body.  Do not drive for 24 hours if you were given a sedative during your procedure.  It is up to you to get the results of your procedure. Ask your health care provider, or the department that is doing the procedure, when your results will be ready.  Keep all follow-up visits as told by your health care provider. This is important. Contact a health care provider if:    You have redness, swelling, or pain near your IV site. Get help right away if:  You have an abnormal heart rhythm.  You have trouble breathing.  You have: ? Chest pain. ? Pain in your back, neck, arm, jaw, or stomach. ? Nausea or sweating. ? Hives or a rash.  You start shaking and cannot stop. These symptoms may represent a serious problem that is an emergency. Do not wait to see if the symptoms will go away. Get medical help right away. Call your local emergency services (911 in the [place] Do not drive yourself to the hospital. Summary  IV contrast material may be used for imaging tests to help your health care providers see your organs and tissues more clearly.  Tell your health care provider if you are pregnant or may be pregnant.  During the procedure, you may feel warmth or flushing as the contrast material enters your bloodstream.  After the procedure, drink enough fluid to keep your urine pale yellow. This information is not intended to replace advice given to you by your health care provider. Make sure you discuss any questions you have with your health care provider. Document Revised: 10/25/2018 Document Reviewed: 10/25/2018 Elsevier Patient Education  2020 Elsevier Inc.  

## 2020-07-09 ENCOUNTER — Other Ambulatory Visit: Payer: Self-pay | Admitting: *Deleted

## 2020-07-09 ENCOUNTER — Telehealth: Payer: Self-pay | Admitting: Family Medicine

## 2020-07-09 MED ORDER — ROSUVASTATIN CALCIUM 20 MG PO TABS: 20.0000 mg | ORAL_TABLET | Freq: Every day | ORAL | 3 refills | Status: DC

## 2020-07-09 MED ORDER — ASPIRIN EC 81 MG PO TBEC: 81.0000 mg | DELAYED_RELEASE_TABLET | Freq: Every day | ORAL | 3 refills | Status: AC

## 2020-07-09 MED FILL — ROSUVASTATIN CALCIUM 20 MG: 20 | 90 days supply | Qty: 90 | Fill #0

## 2020-07-09 NOTE — Telephone Encounter (Signed)
Kelly [name] from [facility] called to request medication that is supposed to be sent. She stated no list of medication was given nor a rejection.  Best contact # [phone]

## 2020-07-10 MED FILL — NURTEC 75 MG TBDP: 75 | 30 days supply | Qty: 8 | Fill #2

## 2020-07-21 NOTE — Telephone Encounter (Signed)
I called [employer] and spoke with Jafona. She states that patient's medical benefits are through [facility], so PA will be through [facility]

## 2020-07-24 ENCOUNTER — Other Ambulatory Visit: Payer: Self-pay

## 2020-07-24 ENCOUNTER — Encounter: Payer: Self-pay | Admitting: Family Medicine

## 2020-07-24 ENCOUNTER — Ambulatory Visit (INDEPENDENT_AMBULATORY_CARE_PROVIDER_SITE_OTHER): Payer: 59 | Admitting: Family Medicine

## 2020-07-24 VITALS — BP 130/78 | HR 80 | Temp 98.1°F | Ht 64.5 in | Wt 175.0 lb

## 2020-07-24 DIAGNOSIS — R200 Anesthesia of skin: Secondary | ICD-10-CM | POA: Diagnosis not present

## 2020-07-24 DIAGNOSIS — R202 Paresthesia of skin: Secondary | ICD-10-CM

## 2020-07-24 DIAGNOSIS — Z720 Tobacco use: Secondary | ICD-10-CM

## 2020-07-24 DIAGNOSIS — Z7689 Persons encountering health services in other specified circumstances: Secondary | ICD-10-CM

## 2020-07-24 DIAGNOSIS — E663 Overweight: Secondary | ICD-10-CM

## 2020-07-24 DIAGNOSIS — Z1331 Encounter for screening for depression: Secondary | ICD-10-CM | POA: Diagnosis not present

## 2020-07-24 DIAGNOSIS — Z636 Dependent relative needing care at home: Secondary | ICD-10-CM
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
## 2020-07-24 NOTE — Patient Instructions (Signed)
  There is not one right eating plan for everyone.  It may take trial and error to find what will work for you.  It is important to get adequate protein and fiber with your meals.  It is okay to not eat breakfast or to skip meals if you are not hungry.  Avoid snacking between meals.  Unless you are on a fluid restriction, drink 80 to 90 ounces of water a day.  Suggested resources- www.dietdoctor.com/diabetes/diet www.adaptyourlifeacademy.com-there is a quiz to help you determine how many carbohydrates you should eat a day  www.thefastingmethod.com  Here are some guidelines to help you with meal planning -  Avoid all processed and packaged foods (bread, pasta, crackers, chips, etc) and beverages containing calories.  Avoid added sugars and excessive natural sugars.  Pay attention to how you feel if you consume artificial sweeteners.  Do they make you more hungry or raise your blood sugar?  With every meal and snack, aim to get 20 g of protein (3 ounces of meat, 4 ounces of fish, 3 eggs, protein powder, 1 cup [place] yogurt, 1 cup cottage cheese, etc.)  Increase fiber in the form of non-starchy vegetables.  These help you feel full with very little carbohydrates and are good for gut health.  Nonstarchy vegetables include summer squash, onions, peppers, tomatoes, eggplant, broccoli, cauliflower, cabbage, lettuce, spinach.  Have small amounts of good fats such as avocado, nuts, olive oil, nut butters, olives.  Add a little cheese to your meals to make them tasty.   Try to plan your meals for the week and do some meal preparation when able.  If possible, make lunches for the week ahead of time.  Plan a couple of dinners and make enough so you can have leftovers.  Build in a treat once a week.

## 2020-07-25 DIAGNOSIS — E663 Overweight: Secondary | ICD-10-CM | POA: Insufficient documentation

## 2020-07-25 DIAGNOSIS — R202 Paresthesia of skin: Secondary | ICD-10-CM | POA: Insufficient documentation

## 2020-07-25 DIAGNOSIS — Z1331 Encounter for screening for depression: Secondary | ICD-10-CM | POA: Insufficient documentation

## 2020-07-25 DIAGNOSIS — R200 Anesthesia of skin: Secondary | ICD-10-CM | POA: Insufficient documentation

## 2020-07-29 ENCOUNTER — Other Ambulatory Visit (HOSPITAL_COMMUNITY): Payer: Self-pay | Admitting: Neurosurgery

## 2020-07-29 DIAGNOSIS — F1120 Opioid dependence, uncomplicated: Secondary | ICD-10-CM | POA: Diagnosis not present

## 2020-07-29 DIAGNOSIS — Q761 Klippel-Feil syndrome: Secondary | ICD-10-CM | POA: Diagnosis not present

## 2020-07-31 MED FILL — PREGABALIN 100 MG CAPS: 100 | 30 days supply | Qty: 60 | Fill #1

## 2020-08-03 MED FILL — OXYCODONE-APAP 5-325MG: 5-325 | 30 days supply | Qty: 90 | Fill #0

## 2020-08-05 NOTE — Progress Notes (Addendum)
This is her first Botox procedure since 08/2018. She has near daily headaches with > 15 migrainous days. She is taking topiramate and Ajovy. Nurtec helps some with abortive therapy. She does have trouble with clenching.   Consent Form Botulism Toxin Injection For Chronic Migraine    Reviewed orally with patient, additionally signature is on file:  Botulism toxin has been approved by the Federal drug administration for treatment of chronic migraine. Botulism toxin does not cure chronic migraine and it may not be effective in some patients.  The administration of botulism toxin is accomplished by injecting a small amount of toxin into the muscles of the neck and head. Dosage must be titrated for each individual. Any benefits resulting from botulism toxin tend to wear off after 3 months with a repeat injection required if benefit is to be maintained. Injections are usually done every 3-4 months with maximum effect peak achieved by about 2 or 3 weeks. Botulism toxin is expensive and you should be sure of what costs you will incur resulting from the injection.  The side effects of botulism toxin use for chronic migraine may include:   -Transient, and usually mild, facial weakness with facial injections  -Transient, and usually mild, head or neck weakness with head/neck injections  -Reduction or loss of forehead facial animation due to forehead muscle weakness  -Eyelid drooping  -Dry eye  -Pain at the site of injection or bruising at the site of injection  -Double vision  -Potential unknown long term risks   Contraindications: You should not have Botox if you are pregnant, nursing, allergic to albumin, have an infection, skin condition, or muscle weakness at the site of the injection, or have myasthenia gravis, Lambert-Eaton syndrome, or ALS.  It is also possible that as with any injection, there may be an allergic reaction or no effect from the medication. Reduced effectiveness after  repeated injections is sometimes seen and rarely infection at the injection site may occur. All care will be taken to prevent these side effects. If therapy is given over a long time, atrophy and wasting in the muscle injected may occur. Occasionally the patient's become refractory to treatment because they develop antibodies to the toxin. In this event, therapy needs to be modified.  I have read the above information and consent to the administration of botulism toxin.    BOTOX PROCEDURE NOTE FOR MIGRAINE HEADACHE  Contraindications and precautions discussed with patient(above). Aseptic procedure was observed and patient tolerated procedure. Procedure performed by Debbora Presto, FNP-C.   The condition has existed for more than 6 months, and pt does not have a diagnosis of ALS, Myasthenia Gravis or Lambert-Eaton Syndrome.  Risks and benefits of injections discussed and pt agrees to proceed with the procedure.  Written consent obtained  These injections are medically necessary. Pt  receives good benefits from these injections. These injections do not cause sedations or hallucinations which the oral therapies may cause.   Description of procedure:  The patient was placed in a sitting position. The standard protocol was used for Botox as follows, with 5 units of Botox injected at each site:  -Procerus muscle, midline injection  -Corrugator muscle, bilateral injection  -Frontalis muscle, bilateral injection, with 2 sites each side, medial injection was performed in the upper one third of the frontalis muscle, in the region vertical from the medial inferior edge of the superior orbital rim. The lateral injection was again in the upper one third of the forehead vertically above the lateral  limbus of the cornea, 1.5 cm lateral to the medial injection site.  -Temporalis muscle injection, 4 sites, bilaterally. The first injection was 3 cm above the tragus of the ear, second injection site was 1.5 cm to 3  cm up from the first injection site in line with the tragus of the ear. The third injection site was 1.5-3 cm forward between the first 2 injection sites. The fourth injection site was 1.5 cm posterior to the second injection site. 5th site laterally in the temporalis  muscleat the level of the outer canthus.  -Occipitalis muscle injection, 3 sites, bilaterally. The first injection was done one half way between the occipital protuberance and the tip of the mastoid process behind the ear. The second injection site was done lateral and superior to the first, 1 fingerbreadth from the first injection. The third injection site was 1 fingerbreadth superiorly and medially from the first injection site.  -Cervical paraspinal muscle injection, 2 sites, bilaterally. The first injection site was 1 cm from the midline of the cervical spine, 3 cm inferior to the lower border of the occipital protuberance. The second injection site was 1.5 cm superiorly and laterally to the first injection site.  -Trapezius muscle injection was performed at 3 sites, bilaterally. The first injection site was in the upper trapezius muscle halfway between the inflection point of the neck, and the acromion. The second injection site was one half way between the acromion and the first injection site. The third injection was done between the first injection site and the inflection point of the neck.  -no masseters but 5 units bilaterally in temporal muscles    Will return for repeat injection in 3 months.   A total of 200 units of Botox was prepared, 165 units of Botox was injected as documented above, any Botox not injected was wasted. The patient tolerated the procedure well, there were no complications of the above procedure.  Made any corrections needed, and agree with procedure  Sarina Ill, MD [facility]

## 2020-08-06 ENCOUNTER — Other Ambulatory Visit: Payer: Self-pay

## 2020-08-06 ENCOUNTER — Ambulatory Visit (INDEPENDENT_AMBULATORY_CARE_PROVIDER_SITE_OTHER): Payer: 59 | Admitting: Family Medicine

## 2020-08-06 DIAGNOSIS — G43719 Chronic migraine without aura, intractable, without status migrainosus: Secondary | ICD-10-CM | POA: Diagnosis not present

## 2020-08-06 NOTE — Progress Notes (Signed)
Botox-100unitsx vials Lot: [id number] Expiration: 02/24 NDC: 8338-2505-39   0.9% Sodium Chloride- 74mL total Lot: [id number] Expiration: 05/2022 NDC: 37902-409-73  Dx: Chronic Migraine B/B   Consent signed

## 2020-08-14 DIAGNOSIS — G4733 Obstructive sleep apnea (adult) (pediatric): Secondary | ICD-10-CM | POA: Diagnosis not present

## 2020-08-20 ENCOUNTER — Other Ambulatory Visit (HOSPITAL_COMMUNITY): Payer: Self-pay | Admitting: Physician Assistant

## 2020-08-20 DIAGNOSIS — L570 Actinic keratosis: Secondary | ICD-10-CM | POA: Diagnosis not present

## 2020-08-20 DIAGNOSIS — L301 Dyshidrosis [pompholyx]: Secondary | ICD-10-CM | POA: Diagnosis not present

## 2020-08-20 DIAGNOSIS — L918 Other hypertrophic disorders of the skin: Secondary | ICD-10-CM | POA: Diagnosis not present

## 2020-08-20 DIAGNOSIS — L718 Other rosacea: Secondary | ICD-10-CM | POA: Diagnosis not present

## 2020-08-20 DIAGNOSIS — D485 Neoplasm of uncertain behavior of skin: Secondary | ICD-10-CM | POA: Diagnosis not present

## 2020-08-20 DIAGNOSIS — D225 Melanocytic nevi of trunk: Secondary | ICD-10-CM | POA: Diagnosis not present

## 2020-08-20 DIAGNOSIS — L814 Other melanin hyperpigmentation: Secondary | ICD-10-CM | POA: Diagnosis not present

## 2020-08-20 MED FILL — metroNIDAZOLE 0.75 % CREA: 0.75 | 30 days supply | Qty: 45 | Fill #0

## 2020-08-20 MED FILL — TRIAMCINOLONE 0.1% CREAM: 0.1 | 30 days supply | Qty: 45 | Fill #0

## 2020-08-27 ENCOUNTER — Other Ambulatory Visit (HOSPITAL_COMMUNITY): Payer: Self-pay | Admitting: Neurosurgery

## 2020-08-27 MED FILL — CYCLOBENZAPRINE HCL 5 MG TA: 5 | 30 days supply | Qty: 90 | Fill #0

## 2020-09-02 MED FILL — OXYCODONE-APAP 5-325MG: 5-325 | 30 days supply | Qty: 90 | Fill #0

## 2020-09-21 ENCOUNTER — Other Ambulatory Visit: Payer: Self-pay | Admitting: Family Medicine

## 2020-09-21 MED ORDER — UBRELVY 100 MG PO TABS: 100.0000 mg | ORAL_TABLET | Freq: Every day | ORAL | 11 refills | Status: DC | PRN

## 2020-09-21 MED FILL — UBRELVY 100 MG TABS: 100 | 5 days supply | Qty: 10 | Fill #0

## 2020-09-22 ENCOUNTER — Ambulatory Visit: Payer: 59 | Admitting: Cardiovascular Disease

## 2020-09-23 ENCOUNTER — Telehealth: Payer: Self-pay

## 2020-09-23 NOTE — Telephone Encounter (Signed)
I submitted a PA request for [place] on [facility], Key: [id number].   Received approval through [place]. The request has been approved. The authorization is effective for a maximum of 6 fills from 09/23/2020 to 03/22/2021, as long as the member is enrolled in their current health plan. This has been approved for a quantity limit of 16.0 with a day supply limit of 30.0. A written notification letter will follow with additional details.

## 2020-09-24 ENCOUNTER — Telehealth (INDEPENDENT_AMBULATORY_CARE_PROVIDER_SITE_OTHER): Payer: 59 | Admitting: Medical

## 2020-09-24 ENCOUNTER — Encounter: Payer: Self-pay | Admitting: Medical

## 2020-09-24 ENCOUNTER — Other Ambulatory Visit: Payer: Self-pay | Admitting: Medical

## 2020-09-24 ENCOUNTER — Other Ambulatory Visit: Payer: Self-pay

## 2020-09-24 VITALS — Ht 63.0 in | Wt 175.0 lb

## 2020-09-24 DIAGNOSIS — J3489 Other specified disorders of nose and nasal sinuses: Secondary | ICD-10-CM

## 2020-09-24 DIAGNOSIS — R059 Cough, unspecified: Secondary | ICD-10-CM | POA: Diagnosis not present

## 2020-09-24 DIAGNOSIS — R110 Nausea: Secondary | ICD-10-CM

## 2020-09-24 DIAGNOSIS — J0110 Acute frontal sinusitis, unspecified: Secondary | ICD-10-CM

## 2020-09-24 MED ORDER — AZITHROMYCIN 250 MG PO TABS: ORAL_TABLET | ORAL | 0 refills | Status: DC

## 2020-09-24 MED ORDER — EMERGEN-C IMMUNE PLUS PO PACK: 1.0000 | PACK | Freq: Two times a day (BID) | ORAL | 0 refills | Status: DC

## 2020-09-24 MED ORDER — ONDANSETRON HCL 4 MG PO TABS: 4.0000 mg | ORAL_TABLET | Freq: Three times a day (TID) | ORAL | 0 refills | Status: DC | PRN

## 2020-09-24 MED FILL — AZITHROMYCIN 250 MG TABLET: 250 | 5 days supply | Qty: 6 | Fill #0

## 2020-09-24 MED FILL — ONDANSETRON HCL 4 MG TABLET: 4 | 10 days supply | Qty: 30 | Fill #0

## 2020-09-24 NOTE — Progress Notes (Signed)
Subjective:     Patient ID: Kelly [name], female   DOB: 1966-02-20, 55 y.o.   MRN: [id number]  This visit type was conducted due to national recommendations for restrictions regarding the COVID-19 Pandemic (e.g. social distancing) in an effort to limit this patient's exposure and mitigate transmission in our community.  Due to their co-morbid illnesses, this patient is at least at moderate risk for complications without adequate follow up.  This format is felt to be most appropriate for this patient at this time.    Documentation for virtual audio and video telecommunications through [place] encounter:  The patient was located at home. The provider was located in the office. The patient did consent to this visit and is aware of possible charges through their insurance for this visit.  The other persons participating in this telemedicine service were none. Time spent on call was 20 minutes and in review of previous records 20 minutes total.  This virtual service is not related to other E/M service within previous 7 days.   HPI Chief Complaint  Patient presents with  . Sinus Problem    Nasal congestion, sinus pressure, headache, cough, diarrhea. Symptoms started 09/21/20   Virtual consult for illness.  Symptoms began 09/21/20, and have included sinus pressure, headache, teeth ache, blood-tinged mucus coming from the sinuses, headache so bad she is having to use ice pack on face all night, some sneezing, some loose stool, some nausea. she notes some cough.  Eyelids even feel swollen.  No eye drainage, no vomiting, no sore throat, no ear pain, no loss of taste or smell.  Using Mucinex sinus over-the-counter.  She has an appointment this afternoon through health at work to have a PCR rapid test that will come back within 24 hours.  Past Medical History:  Diagnosis Date  . Anal fistula    intersphincteric   . Anemia   . Anticardiolipin antibody positive   . Cervicalgia    followed by  dr Sherley Bounds  . Depression   . History of herpes genitalis    many yrs ago  . History of kidney stones   . Hyperlipidemia   . Hypertension    followed by pcp   (11-25-2019  per pt had stress test approx. 2018 w/ Dr Wynonia Lawman, told normal)  . Migraines    neurologist--- dr Jaynee Eagles  . OSA on CPAP   . TMJ (dislocation of temporomandibular joint)    11-25-2019  jaw pops, no appliance  . Wears glasses     Review of Systems As in subjective    Objective:   Physical Exam Due to coronavirus pandemic stay at home measures, patient visit was virtual and they were not examined in person.   Ht 5\' 3"  (1.6 m)   Wt 175 lb (79.4 kg)   BMI 31.00 kg/m   General: Well-developed well-nourished no acute distress No obvious shortness of breath or wheezing Answers questions in complete sentences without labored breathing      Assessment:     Encounter Diagnoses  Name Primary?  . Sinus pressure Yes  . Cough   . Nausea        Plan:     We discussed symptoms and concerns.   Symptoms suggestive of sinusitis, but also discussed that COVID symptoms could overlap with other respiratory symptoms.  She has a PCR Covid test today scheduled with health at work.  In the meantime she will begin medication as below, which includes a azithromycin which can also  provide anti-inflammatory effects for COVID and sinusitis, rest, hydrate well.  I advised nasal saline flush.  She will continue the over-the-counter Mucinex medication she is taking for congestion.  Continue Tylenol as needed for fever or malaise.  If she tests positive for COVID she will call back and let me know  If worse or not much improved within the next several days then call back or recheck  Amber was seen today for sinus problem.  Diagnoses and all orders for this visit:  Sinus pressure  Cough  Nausea  Other orders -     Multiple Vitamins-Minerals (EMERGEN-C IMMUNE PLUS) PACK; Take 1 tablet by mouth 2 (two) times daily. -      ondansetron (ZOFRAN) 4 MG tablet; Take 1 tablet (4 mg total) by mouth every 8 (eight) hours as needed for nausea or vomiting. -     azithromycin (ZITHROMAX) 250 MG tablet; 2 tablets day 1, then 1 tablet days 2-4  f/u pending covid test

## 2020-10-01 MED FILL — UBRELVY 100 MG TABS: 100 | 5 days supply | Qty: 10 | Fill #1

## 2020-10-02 MED FILL — OXYCODONE-APAP 5-325MG: 5-325 | 30 days supply | Qty: 90 | Fill #0

## 2020-10-13 ENCOUNTER — Other Ambulatory Visit: Payer: Self-pay

## 2020-10-13 ENCOUNTER — Other Ambulatory Visit: Payer: Self-pay | Admitting: Medical

## 2020-10-13 ENCOUNTER — Other Ambulatory Visit (INDEPENDENT_AMBULATORY_CARE_PROVIDER_SITE_OTHER): Payer: 59

## 2020-10-13 DIAGNOSIS — Z20822 Contact with and (suspected) exposure to covid-19: Secondary | ICD-10-CM

## 2020-10-13 LAB — POC COVID19 BINAXNOW: SARS Coronavirus 2 Ag: NEGATIVE

## 2020-10-14 LAB — NOVEL CORONAVIRUS, NAA: SARS-CoV-2, NAA: NOT DETECTED

## 2020-10-14 LAB — SARS-COV-2, NAA 2 DAY TAT

## 2020-10-15 ENCOUNTER — Encounter: Payer: Self-pay | Admitting: Cardiovascular Disease

## 2020-10-15 ENCOUNTER — Other Ambulatory Visit: Payer: Self-pay

## 2020-10-15 ENCOUNTER — Ambulatory Visit (INDEPENDENT_AMBULATORY_CARE_PROVIDER_SITE_OTHER): Payer: 59 | Admitting: Cardiovascular Disease

## 2020-10-15 VITALS — BP 102/72 | HR 75 | Ht 64.5 in | Wt 177.0 lb

## 2020-10-15 DIAGNOSIS — R931 Abnormal findings on diagnostic imaging of heart and coronary circulation: Secondary | ICD-10-CM | POA: Diagnosis not present

## 2020-10-15 DIAGNOSIS — I2510 Atherosclerotic heart disease of native coronary artery without angina pectoris: Secondary | ICD-10-CM | POA: Diagnosis not present

## 2020-10-15 MED FILL — ROSUVASTATIN CALCIUM 20 MG: 20 | 90 days supply | Qty: 90 | Fill #1

## 2020-10-15 NOTE — Patient Instructions (Signed)
Medication Instructions:  The current medical regimen is effective;  continue present plan and medications.  *If you need a refill on your cardiac medications before your next appointment, please call your pharmacy*  Testing/Procedures: Echocardiogram - Your physician has requested that you have an echocardiogram. Echocardiography is a painless test that uses sound waves to create images of your heart. It provides your doctor with information about the size and shape of your heart and how well your heart's chambers and valves are working. This procedure takes approximately one hour. There are no restrictions for this procedure. This will be performed at our [facility] location - [address], [address].    Follow-Up: At [facility], you and your health needs are our priority.  As part of our continuing mission to provide you with exceptional heart care, we have created designated Provider Care Teams.  These Care Teams include your primary Cardiologist (physician) and Advanced Practice Providers (APPs -  Physician Assistants and Nurse Practitioners) who all work together to provide you with the care you need, when you need it.  We recommend signing up for the patient portal called "MyChart".  Sign up information is provided on this After Visit Summary.  MyChart is used to connect with patients for Virtual Visits (Telemedicine).  Patients are able to view lab/test results, encounter notes, upcoming appointments, etc.  Non-urgent messages can be sent to your provider as well.   To learn more about what you can do with MyChart, go to [email or web address].    Your next appointment:   12 month(s)  The format for your next appointment:   In Person  Provider:   Eleonore Chiquito, MD   Other Instructions Have PCP send [place] LIPID levels when they are completed. Thank you!

## 2020-10-15 NOTE — Progress Notes (Signed)
Cardiology Office Note:   Date:  10/15/2020  NAME:  Kelly [name]    MRN: [id number] DOB:  06/21/1966   PCP:  Elby Beck, FNP (Inactive)  Cardiologist:  No primary care provider on file.   Referring MD: Carlena Hurl, PA-C   Chief Complaint  Patient presents with  . Follow-up    3 months.   History of Present Illness:   Kelly [name] is a 55 y.o. female with a hx of obesity, nonobstructive CAD, hyperlipidemia who presents for follow-up.  She was evaluated for atypical chest pain.  Found to have nonobstructive CAD.  Started on aspirin and Crestor.  She is doing well on Crestor.  She will have repeat labs checked by her primary care physician next month.  She reports she still gets sharp intermittent chest pains.  Occur at any time.  Largely driven by stress.  They are atypical for cardiac pain.  We did go over the fact that this is not related to her heart.  Mainstay of treatment is aspirin and Crestor.  She seems to be doing well on Crestor.  She does have some aches and pains and possibly fibromyalgia.  She reports it is okay for now.  We discussed diet and exercise.  She has plans to lose weight.  Overall she seems to be doing well.  BP in office 102/72.  She works for [employer].  Problem list 1.  Nonobstructive CAD -Coronary calcium score 41 (93rd percentile) -25-49% LAD -Less than 25% left circumflex 2.  Hyperlipidemia -Total cholesterol 196, HDL 47, LDL 117, triglycerides 196 3. PFO 4.  Hypertension  Past Medical History: Past Medical History:  Diagnosis Date  . Anal fistula    intersphincteric   . Anemia   . Anticardiolipin antibody positive   . Cervicalgia    followed by dr Sherley Bounds  . Depression   . History of herpes genitalis    many yrs ago  . History of kidney stones   . Hyperlipidemia   . Hypertension    followed by pcp   (11-25-2019  per pt had stress test approx. 2018 w/ Dr Wynonia Lawman, told normal)  . Migraines    neurologist---  dr Jaynee Eagles  . OSA on CPAP   . TMJ (dislocation of temporomandibular joint)    11-25-2019  jaw pops, no appliance  . Wears glasses     Past Surgical History: Past Surgical History:  Procedure Laterality Date  . ANTERIOR CERVICAL DECOMP/DISCECTOMY FUSION  12[facility]   C[facility] - 6  . CESAREAN SECTION     X3   LAST ONE 11-30-2004  . CYSTOSCOPY W/ RETROGRADES Right 10/29/2012   Procedure: CYSTOSCOPY WITH RETROGRADE PYELOGRAM and stent placement;  Surgeon: Reece Packer, MD;  Location: [facility];  Service: Urology;  Laterality: Right;  rt stent placement , rt retrograde and cysto   . CYSTOSCOPY W/ URETERAL STENT PLACEMENT Right 11/23/2012   Procedure: CYSTOSCOPY WITH STENT REPLACEMENT;  Surgeon: Alexis Frock, MD;  Location[facility];  Service: Urology;  Laterality: Right;  . CYSTOSCOPY/RETROGRADE/URETEROSCOPY/STONE EXTRACTION WITH BASKET Right 11/23/2012   Procedure: CYSTOSCOPY/RETROGRADE/URETEROSCOPY/STONE EXTRACTION WITH BASKET;  Surgeon: Alexis Frock, MD;  Location: [facility];  Service: Urology;  Laterality: Right;  . EVALUATION UNDER ANESTHESIA WITH ANAL FISSUROTOMY N/A 01/23/2020   Procedure: ANAL EXAM UNDER ANESTHESIA, FISTULOTOMY, SKIN TAG EXCISION;  Surgeon: Leighton Ruff, MD;  Location: [facility];  Service: General;  Laterality: N/A;    Current Medications: Current Meds  Medication Sig  . acyclovir ointment (ZOVIRAX) 5 % APPLY 1 APPLICATION TOPICALLY EVERY 3 (THREE) HOURS AS DIRECTED.  Marland Kitchen aspirin EC 81 MG tablet Take 1 tablet (81 mg total) by mouth daily. Swallow whole.  . cyclobenzaprine (FLEXERIL) 5 MG tablet TAKE 1 TABLET BY MOUTH 3 TIMES A DAY AS NEEDED FOR MUSCLE SPASMS  . levonorgestrel (MIRENA) 20 MCG/24HR IUD 1 each by Intrauterine route once.  . Lidocaine, Anorectal, 5 % CREA APPLY SMALL AMOUNT AS NEEDED TO RECTAL AREA  . meloxicam (MOBIC) 15 MG tablet Take 15 mg by mouth daily.  . metoprolol  tartrate (LOPRESSOR) 50 MG tablet TAKE 1 TABLET BY MOUTH TWICE DAILY  . metroNIDAZOLE (METROCREAM) 0.75 % cream APPLY TO FACE 2 TIMES DAILY  . Multiple Vitamin (MULTIVITAMIN WITH MINERALS) TABS Take 1 tablet by mouth daily.  . ondansetron (ZOFRAN) 4 MG tablet Take 1 tablet (4 mg total) by mouth every 8 (eight) hours as needed for nausea or vomiting.  Marland Kitchen oxyCODONE-acetaminophen (PERCOCET/ROXICET) 5-325 MG tablet Take 1 tablet by mouth every 8 (eight) hours as needed for moderate pain or severe pain.  . pregabalin (LYRICA) 100 MG capsule Take 100 mg by mouth in the morning and at bedtime. Patient reports taking BID  . topiramate (TOPAMAX) 50 MG tablet Take 1 tablet (50 mg total) by mouth 2 (two) times daily.  . traMADol (ULTRAM) 50 MG tablet Take 50 mg by mouth every 6 (six) hours as needed for moderate pain.   Marland Kitchen triamcinolone (KENALOG) 0.1 % APPLY TOPICALLY TO AFFECTED AREA ON HANDS AND FINGERS FOR TWO WEEKS ON AND OFF AS NEEDED FOR FLARES.  Marland Kitchen Ubrogepant (UBRELVY) 100 MG TABS Take 100 mg by mouth daily as needed. Take one tablet at onset of headache, may repeat 1 tablet in 2 hours, no more than 2 tablets in 24 hours  . [DISCONTINUED] Fremanezumab-vfrm (AJOVY) 225 MG/1.5ML SOAJ Inject 225 mg into the skin every 30 (thirty) days.  . [DISCONTINUED] Multiple Vitamins-Minerals (EMERGEN-C IMMUNE PLUS) PACK Take 1 tablet by mouth 2 (two) times daily.     Allergies:    Imitrex [sumatriptan base], Zomig, Avelox [moxifloxacin hcl in nacl], Morphine, Septra [bactrim], and Sumatriptan   Social History: Social History   Socioeconomic History  . Marital status: Single    Spouse name: Not on file  . Number of children: 3  . Years of education: 37  . Highest education level: Not on file  Occupational History  . Occupation: patient advocate     Comment: [employer], pt advocate  Tobacco Use  . Smoking status: Current Every Day Smoker    Packs/day: 0.50    Years: 15.00    Pack years: 7.50    Types:  Cigarettes  . Smokeless tobacco: Never Used  Vaping Use  . Vaping Use: Never used  Substance and Sexual Activity  . Alcohol use: No    Alcohol/week: 0.0 standard drinks  . Drug use: Never  . Sexual activity: Yes    Partners: Male    Birth control/protection: I.U.D.    Comment: 1ST intercourse- 18, partners- 7 current partner- 1 year   Other Topics Concern  . Not on file  Social History Narrative   Lives at home w/ her children   Right-handed   Caffeine: 1 soda per day   Social Determinants of Health   Financial Resource Strain: Not on file  Food Insecurity: Not on file  Transportation Needs: Not on  file  Physical Activity: Not on file  Stress: Not on file  Social Connections: Not on file     Family History: The patient's family history includes Breast cancer in her maternal grandmother; Cancer in her father; Diabetes in her father and sister; Heart attack in her mother; Heart disease in her maternal uncle; Heart disease (age of onset: 81) in her maternal uncle; Heart disease (age of onset: 5) in her mother; Hypertension in her father; Kidney Stones in her father.  ROS:   All other ROS reviewed and negative. Pertinent positives noted in the HPI.     EKGs/Labs/Other Studies Reviewed:   The following studies were personally reviewed by me today:  CCTA 07/09/2020 IMPRESSION: 1. Coronary calcium score of 41. This was 93rd percentile for age and sex matched controls.  2. Normal coronary origin with right dominance.  3. Mild non-obstructive CAD (25-49%) in the LAD.  4. Minimal CAD in the LCX/RCA (<25%).  5. A PFO is present.  RECOMMENDATIONS: 1. Mild non-obstructive CAD (25-49%). Consider non-atherosclerotic causes of chest pain. Consider preventive therapy and risk factor modification.  Recent Labs: 04/14/2020: ALT 12; Hemoglobin 12.8; Platelets 268; TSH 1.920 06/26/2020: BUN 14; Creatinine, Ser 0.75; Potassium 3.9; Sodium 139   Recent Lipid Panel     Component Value Date/Time   CHOL 196 11/01/2019 0957   CHOL 127 10/06/2017 0905   TRIG 196 (H) 11/01/2019 0957   HDL 47 (L) 11/01/2019 0957   HDL 50 10/06/2017 0905   CHOLHDL 4.2 11/01/2019 0957   VLDL NOT CALC 05/17/2016 1442   LDLCALC 117 (H) 11/01/2019 0957    Physical Exam:   VS:  BP 102/72 (BP Location: Left Arm, Patient Position: Sitting, Cuff Size: Normal)   Pulse 75   Ht 5' 4.5" (1.638 m)   Wt 177 lb (80.3 kg)   BMI 29.91 kg/m    Wt Readings from Last 3 Encounters:  10/15/20 177 lb (80.3 kg)  09/24/20 175 lb (79.4 kg)  07/24/20 175 lb (79.4 kg)    General: Well nourished, well developed, in no acute distress Head: Atraumatic, normal size  Eyes: PEERLA, EOMI  Neck: Supple, no JVD Endocrine: No thryomegaly Cardiac: Normal S1, S2; RRR; no murmurs, rubs, or gallops Lungs: Clear to auscultation bilaterally, no wheezing, rhonchi or rales  Abd: Soft, nontender, no hepatomegaly  Ext: No edema, pulses 2+ Musculoskeletal: No deformities, BUE and BLE strength normal and equal Skin: Warm and dry, no rashes   Neuro: Alert and oriented to person, place, time, and situation, CNII-XII grossly intact, no focal deficits  Psych: Normal mood and affect   ASSESSMENT:   Kelly [name] is a 55 y.o. female who presents for the following: 1. Coronary artery disease involving native coronary artery of native heart without angina pectoris   2. Agatston coronary artery calcium score less than 100     PLAN:   1. Coronary artery disease involving native coronary artery of native heart without angina pectoris 2. Agatston coronary artery calcium score less than 100 -Coronary calcium score in the 93rd percentile.  Nonobstructive CAD.  Her symptoms of chest pain are atypical and likely stress related.  She should continue aspirin 81 mg daily.  Have also recommended to continue Crestor 20 mg daily.  She will need repeat lipid profile fasting in the next month.  She will do this at her primary  care physician.  Goal LDL cholesterol less than 70.  If her value is really low we can consider cutting  her Crestor.  Apparently she has concerns about being on such a high dose.  We may need to drop it and add Zetia.  We will discuss this once we see the results of her lipid panel. -She also needs an updated echocardiogram.  We will set this up.  Disposition: Return in about 1 year (around 10/15/2021).  Medication Adjustments/Labs and Tests Ordered: Current medicines are reviewed at length with the patient today.  Concerns regarding medicines are outlined above.  Orders Placed This Encounter  Procedures  . ECHOCARDIOGRAM COMPLETE   No orders of the defined types were placed in this encounter.   Patient Instructions  Medication Instructions:  The current medical regimen is effective;  continue present plan and medications.  *If you need a refill on your cardiac medications before your next appointment, please call your pharmacy*  Testing/Procedures: Echocardiogram - Your physician has requested that you have an echocardiogram. Echocardiography is a painless test that uses sound waves to create images of your heart. It provides your doctor with information about the size and shape of your heart and how well your heart's chambers and valves are working. This procedure takes approximately one hour. There are no restrictions for this procedure. This will be performed at our [facility] location - [address].    Follow-Up: At [facility], you and your health needs are our priority.  As part of our continuing mission to provide you with exceptional heart care, we have created designated Provider Care Teams.  These Care Teams include your primary Cardiologist (physician) and Advanced Practice Providers (APPs -  Physician Assistants and Nurse Practitioners) who all work together to provide you with the care you need, when you need it.  We recommend signing up for the patient  portal called "MyChart".  Sign up information is provided on this After Visit Summary.  MyChart is used to connect with patients for Virtual Visits (Telemedicine).  Patients are able to view lab/test results, encounter notes, upcoming appointments, etc.  Non-urgent messages can be sent to your provider as well.   To learn more about what you can do with MyChart, go to [email or web address].    Your next appointment:   12 month(s)  The format for your next appointment:   In Person  Provider:   Eleonore Chiquito, MD   Other Instructions Have PCP send [place] LIPID levels when they are completed. Thank you!     Time Spent with Patient: I have spent a total of 25 minutes with patient reviewing hospital notes, telemetry, EKGs, labs and examining the patient as well as establishing an assessment and plan that was discussed with the patient.  > 50% of time was spent in direct patient care.  Signed, Addison Naegeli. Audie Box, [place]  [address] [place], [place] [address] [phone]  10/15/2020 5:02 PM

## 2020-10-16 ENCOUNTER — Other Ambulatory Visit: Payer: 59

## 2020-10-16 DIAGNOSIS — Z20822 Contact with and (suspected) exposure to covid-19: Secondary | ICD-10-CM

## 2020-10-17 LAB — SARS-COV-2, NAA 2 DAY TAT

## 2020-10-17 LAB — NOVEL CORONAVIRUS, NAA: SARS-CoV-2, NAA: NOT DETECTED

## 2020-10-21 ENCOUNTER — Other Ambulatory Visit (HOSPITAL_COMMUNITY): Payer: Self-pay | Admitting: Neurosurgery

## 2020-10-21 DIAGNOSIS — F1120 Opioid dependence, uncomplicated: Secondary | ICD-10-CM | POA: Diagnosis not present

## 2020-10-21 DIAGNOSIS — M79641 Pain in right hand: Secondary | ICD-10-CM | POA: Diagnosis not present

## 2020-10-21 DIAGNOSIS — Q761 Klippel-Feil syndrome: Secondary | ICD-10-CM | POA: Diagnosis not present

## 2020-10-21 DIAGNOSIS — G5623 Lesion of ulnar nerve, bilateral upper limbs: Secondary | ICD-10-CM | POA: Diagnosis not present

## 2020-10-21 DIAGNOSIS — M79642 Pain in left hand: Secondary | ICD-10-CM | POA: Diagnosis not present

## 2020-10-23 ENCOUNTER — Other Ambulatory Visit (HOSPITAL_COMMUNITY): Payer: Self-pay | Admitting: Pharmacist

## 2020-10-23 MED FILL — UBRELVY 100 MG TABS: 100 | 5 days supply | Qty: 10 | Fill #2

## 2020-10-23 MED FILL — CARESTART COVID-19 HOME TES: 4 days supply | Qty: 4 | Fill #0

## 2020-10-29 MED FILL — CYCLOBENZAPRINE HCL 5 MG TA: 5 | 30 days supply | Qty: 90 | Fill #1

## 2020-10-30 MED FILL — OXYCODONE-APAP 5-325MG: 5-325 | 30 days supply | Qty: 90 | Fill #0

## 2020-11-05 ENCOUNTER — Encounter: Payer: Self-pay | Admitting: Obstetrics & Gynecology

## 2020-11-05 ENCOUNTER — Other Ambulatory Visit (HOSPITAL_COMMUNITY): Payer: Self-pay | Admitting: Physician Assistant

## 2020-11-05 DIAGNOSIS — Z1231 Encounter for screening mammogram for malignant neoplasm of breast: Secondary | ICD-10-CM | POA: Diagnosis not present

## 2020-11-05 DIAGNOSIS — L300 Nummular dermatitis: Secondary | ICD-10-CM | POA: Diagnosis not present

## 2020-11-05 DIAGNOSIS — L718 Other rosacea: Secondary | ICD-10-CM | POA: Diagnosis not present

## 2020-11-06 ENCOUNTER — Other Ambulatory Visit (INDEPENDENT_AMBULATORY_CARE_PROVIDER_SITE_OTHER): Payer: 59

## 2020-11-06 ENCOUNTER — Other Ambulatory Visit: Payer: Self-pay

## 2020-11-06 DIAGNOSIS — Z23 Encounter for immunization: Secondary | ICD-10-CM

## 2020-11-09 ENCOUNTER — Other Ambulatory Visit: Payer: Self-pay | Admitting: Medical

## 2020-11-09 DIAGNOSIS — G4733 Obstructive sleep apnea (adult) (pediatric): Secondary | ICD-10-CM | POA: Diagnosis not present

## 2020-11-09 MED FILL — PREGABALIN 100 MG CAPS: 100 | 90 days supply | Qty: 180 | Fill #0

## 2020-11-09 MED FILL — METOPROLOL TARTRATE 50 MG T: 50 | 90 days supply | Qty: 180 | Fill #0

## 2020-11-10 ENCOUNTER — Ambulatory Visit (HOSPITAL_COMMUNITY): Payer: 59 | Attending: Cardiovascular Disease

## 2020-11-10 ENCOUNTER — Ambulatory Visit (INDEPENDENT_AMBULATORY_CARE_PROVIDER_SITE_OTHER): Payer: 59 | Admitting: Family Medicine

## 2020-11-10 ENCOUNTER — Other Ambulatory Visit: Payer: Self-pay

## 2020-11-10 DIAGNOSIS — I2510 Atherosclerotic heart disease of native coronary artery without angina pectoris: Secondary | ICD-10-CM

## 2020-11-10 DIAGNOSIS — G43709 Chronic migraine without aura, not intractable, without status migrainosus: Secondary | ICD-10-CM

## 2020-11-10 LAB — ECHOCARDIOGRAM COMPLETE
Area-P 1/2: 4.6 cm2
S' Lateral: 3.2 cm

## 2020-11-10 NOTE — Progress Notes (Signed)
Botox- 100 units x 2 vial Lot: [id number] Expiration: 12/2022 NDC: 1505-6979-48  Bacteriostatic 0.9% Sodium Chloride- 38mL total Lot: [id number] Expiration: 2/23 NDC: 48270-786-75  Dx: Q49.201 BB

## 2020-11-10 NOTE — Progress Notes (Addendum)
11/10/2020 ALL: She returns for 2nd Botox procedure. She does note some improvement in the intensity of migraines. She feels that frequency is unchanged. She is using a full prescription of Nurtec every month. She continues Ajovy and topiramate.   08/06/2020 ALL: This is her first Botox procedure since 08/2018. She has near daily headaches with > 15 migrainous days. She is taking topiramate and Ajovy. Nurtec helps some with abortive therapy. She does have trouble with clenching.   Consent Form Botulism Toxin Injection For Chronic Migraine    Reviewed orally with patient, additionally signature is on file:  Botulism toxin has been approved by the Federal drug administration for treatment of chronic migraine. Botulism toxin does not cure chronic migraine and it may not be effective in some patients.  The administration of botulism toxin is accomplished by injecting a small amount of toxin into the muscles of the neck and head. Dosage must be titrated for each individual. Any benefits resulting from botulism toxin tend to wear off after 3 months with a repeat injection required if benefit is to be maintained. Injections are usually done every 3-4 months with maximum effect peak achieved by about 2 or 3 weeks. Botulism toxin is expensive and you should be sure of what costs you will incur resulting from the injection.  The side effects of botulism toxin use for chronic migraine may include:   -Transient, and usually mild, facial weakness with facial injections  -Transient, and usually mild, head or neck weakness with head/neck injections  -Reduction or loss of forehead facial animation due to forehead muscle weakness  -Eyelid drooping  -Dry eye  -Pain at the site of injection or bruising at the site of injection  -Double vision  -Potential unknown long term risks   Contraindications: You should not have Botox if you are pregnant, nursing, allergic to albumin, have an infection, skin  condition, or muscle weakness at the site of the injection, or have myasthenia gravis, Lambert-Eaton syndrome, or ALS.  It is also possible that as with any injection, there may be an allergic reaction or no effect from the medication. Reduced effectiveness after repeated injections is sometimes seen and rarely infection at the injection site may occur. All care will be taken to prevent these side effects. If therapy is given over a long time, atrophy and wasting in the muscle injected may occur. Occasionally the patient's become refractory to treatment because they develop antibodies to the toxin. In this event, therapy needs to be modified.  I have read the above information and consent to the administration of botulism toxin.    BOTOX PROCEDURE NOTE FOR MIGRAINE HEADACHE  Contraindications and precautions discussed with patient(above). Aseptic procedure was observed and patient tolerated procedure. Procedure performed by Debbora Presto, FNP-C.   The condition has existed for more than 6 months, and pt does not have a diagnosis of ALS, Myasthenia Gravis or Lambert-Eaton Syndrome.  Risks and benefits of injections discussed and pt agrees to proceed with the procedure.  Written consent obtained  These injections are medically necessary. Pt  receives good benefits from these injections. These injections do not cause sedations or hallucinations which the oral therapies may cause.   Description of procedure:  The patient was placed in a sitting position. The standard protocol was used for Botox as follows, with 5 units of Botox injected at each site:  -Procerus muscle, midline injection  -Corrugator muscle, bilateral injection  -Frontalis muscle, bilateral injection, with 2 sites each side, medial  injection was performed in the upper one third of the frontalis muscle, in the region vertical from the medial inferior edge of the superior orbital rim. The lateral injection was again in the upper one  third of the forehead vertically above the lateral limbus of the cornea, 1.5 cm lateral to the medial injection site.  -Temporalis muscle injection, 4 sites, bilaterally. The first injection was 3 cm above the tragus of the ear, second injection site was 1.5 cm to 3 cm up from the first injection site in line with the tragus of the ear. The third injection site was 1.5-3 cm forward between the first 2 injection sites. The fourth injection site was 1.5 cm posterior to the second injection site. 5th site laterally in the temporalis  muscleat the level of the outer canthus.  -Occipitalis muscle injection, 3 sites, bilaterally. The first injection was done one half way between the occipital protuberance and the tip of the mastoid process behind the ear. The second injection site was done lateral and superior to the first, 1 fingerbreadth from the first injection. The third injection site was 1 fingerbreadth superiorly and medially from the first injection site.  -Cervical paraspinal muscle injection, 2 sites, bilaterally. The first injection site was 1 cm from the midline of the cervical spine, 3 cm inferior to the lower border of the occipital protuberance. The second injection site was 1.5 cm superiorly and laterally to the first injection site.  -Trapezius muscle injection was performed at 3 sites, bilaterally. The first injection site was in the upper trapezius muscle halfway between the inflection point of the neck, and the acromion. The second injection site was one half way between the acromion and the first injection site. The third injection was done between the first injection site and the inflection point of the neck.  -no masseters but 5 units bilaterally in temporal muscles    Will return for repeat injection in 3 months.   A total of 200 units of Botox was prepared, 165 units of Botox was injected as documented above, any Botox not injected was wasted. The patient tolerated the procedure  well, there were no complications of the above procedure.   Made any corrections needed, and agree with history, physical, neuro exam,assessment and plan as stated.     Sarina Ill, MD [facility]

## 2020-11-17 ENCOUNTER — Telehealth: Payer: Self-pay | Admitting: *Deleted

## 2020-11-17 DIAGNOSIS — Z006 Encounter for examination for normal comparison and control in clinical research program: Secondary | ICD-10-CM

## 2020-11-17 NOTE — Telephone Encounter (Signed)
I called patient for Identify 90-day phone call.I left message for patient to call me and sent e-mail to patient also.

## 2020-11-30 ENCOUNTER — Other Ambulatory Visit (HOSPITAL_COMMUNITY): Payer: Self-pay

## 2020-11-30 MED FILL — Oxycodone w/ Acetaminophen Tab 5-325 MG: ORAL | 30 days supply | Qty: 90 | Fill #0 | Status: AC

## 2020-12-01 ENCOUNTER — Other Ambulatory Visit (HOSPITAL_COMMUNITY): Payer: Self-pay

## 2020-12-01 ENCOUNTER — Ambulatory Visit (INDEPENDENT_AMBULATORY_CARE_PROVIDER_SITE_OTHER): Payer: 59 | Admitting: Medical

## 2020-12-01 ENCOUNTER — Other Ambulatory Visit: Payer: Self-pay

## 2020-12-01 ENCOUNTER — Encounter: Payer: Self-pay | Admitting: Medical

## 2020-12-01 VITALS — BP 102/78 | HR 73 | Temp 99.1°F | Ht 64.0 in

## 2020-12-01 DIAGNOSIS — S0501XA Injury of conjunctiva and corneal abrasion without foreign body, right eye, initial encounter: Secondary | ICD-10-CM | POA: Diagnosis not present

## 2020-12-01 MED ORDER — ERYTHROMYCIN 5 MG/GM OP OINT
1.0000 "application " | TOPICAL_OINTMENT | Freq: Every day | OPHTHALMIC | 0 refills | Status: DC
  Filled 2020-12-01: qty 3.5, 12d supply, fill #0

## 2020-12-01 NOTE — Progress Notes (Signed)
Subjective:  Kelly [name] is a 55 y.o. female who presents for Chief Complaint  Patient presents with  . Eye Problem    Right eye red, watery and painful.     Awoke this morning with painful watery red right eye.  No pink eye contacts.  No crusting, but +light sensitive.  Left eye is watery, but otherwise fine.   No concern for recent foreign body in eye.   Did lawn trimming over weekend but not problems til today, so don't think she got anything in eye.  Allergies have been bothering her some.   Eye ball itself hurts.  Vision is clear.  Sees eye doctor in general, just saw them a few months ago.  No concern for glaucoma.  No other aggravating or relieving factors.    No other c/o.  The following portions of the patient's history were reviewed and updated as appropriate: allergies, current medications, past family history, past medical history, past social history, past surgical history and problem list.  ROS Otherwise as in subjective above  Objective: BP 102/78   Pulse 73   Temp 99.1 F (37.3 C)   Ht 5\' 4"  (1.626 m)   SpO2 98%   BMI 30.38 kg/m   General appearance: alert, no distress, well developed, well nourished HEENT: normocephalic, sclerae anicteric, conjunctiva pink and moist, PERRLA, EOMI, no obvious abnormality on inspection fluorescein stain shows small area of uptake to right of iris, otherwise normal exam     Assessment: Encounter Diagnosis  Name Primary?  . Abrasion of right cornea, initial encounter Yes     Plan: Discussed symptoms, findings.   Begin Romycin, continue good hygiene.  Can also use allergy eye drops OTC for allergy symptoms in general.   If not resolved or back to normal within a week, recheck.   If worse in meantime, then call back  [name] was seen today for eye problem.  Diagnoses and all orders for this visit:  Abrasion of right cornea, initial encounter  Other orders -     erythromycin ophthalmic ointment; Place 1 application into  the right eye at bedtime.    Follow up: prn

## 2020-12-11 ENCOUNTER — Other Ambulatory Visit (HOSPITAL_COMMUNITY): Payer: Self-pay

## 2020-12-11 MED FILL — Ubrogepant Tab 100 MG: ORAL | 30 days supply | Qty: 8 | Fill #0 | Status: AC

## 2020-12-18 ENCOUNTER — Other Ambulatory Visit (HOSPITAL_COMMUNITY): Payer: Self-pay

## 2020-12-18 MED ORDER — CARESTART COVID-19 HOME TEST VI KIT
PACK | 0 refills | Status: DC
  Filled 2020-12-18: qty 4, 4d supply, fill #0

## 2020-12-24 ENCOUNTER — Other Ambulatory Visit (HOSPITAL_COMMUNITY): Payer: Self-pay

## 2020-12-24 ENCOUNTER — Other Ambulatory Visit: Payer: Self-pay | Admitting: Medical

## 2020-12-24 MED FILL — Cyclobenzaprine HCl Tab 5 MG: ORAL | 20 days supply | Qty: 60 | Fill #0 | Status: AC

## 2020-12-24 MED FILL — Topiramate Tab 50 MG: ORAL | 90 days supply | Qty: 180 | Fill #0 | Status: AC

## 2020-12-25 ENCOUNTER — Other Ambulatory Visit (HOSPITAL_COMMUNITY): Payer: Self-pay

## 2020-12-25 ENCOUNTER — Other Ambulatory Visit: Payer: Self-pay | Admitting: Medical

## 2020-12-28 ENCOUNTER — Other Ambulatory Visit (HOSPITAL_COMMUNITY): Payer: Self-pay

## 2020-12-29 ENCOUNTER — Other Ambulatory Visit: Payer: Self-pay | Admitting: Medical

## 2020-12-29 ENCOUNTER — Other Ambulatory Visit (HOSPITAL_COMMUNITY): Payer: Self-pay

## 2020-12-29 ENCOUNTER — Other Ambulatory Visit: Payer: Self-pay | Admitting: Neurosurgery

## 2020-12-29 MED ORDER — METOPROLOL TARTRATE 50 MG PO TABS
50.0000 mg | ORAL_TABLET | Freq: Two times a day (BID) | ORAL | 0 refills | Status: DC
  Filled 2020-12-29 – 2021-04-23 (×3): qty 180, 90d supply, fill #0

## 2020-12-29 NOTE — Telephone Encounter (Signed)
Is this okay to fill as I do not see this has been mentioned in your notes of bp and she saw a new pcp 12/21 and no mention in her notes about bp either. Just trying to find out who needs to refill this

## 2020-12-30 ENCOUNTER — Other Ambulatory Visit: Payer: Self-pay

## 2020-12-30 ENCOUNTER — Other Ambulatory Visit (HOSPITAL_COMMUNITY): Payer: Self-pay

## 2020-12-30 MED FILL — Oxycodone w/ Acetaminophen Tab 5-325 MG: ORAL | 30 days supply | Qty: 90 | Fill #0 | Status: AC

## 2021-01-14 ENCOUNTER — Other Ambulatory Visit (HOSPITAL_COMMUNITY): Payer: Self-pay

## 2021-01-14 MED FILL — Ubrogepant Tab 100 MG: ORAL | 30 days supply | Qty: 8 | Fill #1 | Status: AC

## 2021-01-20 ENCOUNTER — Other Ambulatory Visit (HOSPITAL_COMMUNITY): Payer: Self-pay

## 2021-01-20 DIAGNOSIS — Q761 Klippel-Feil syndrome: Secondary | ICD-10-CM | POA: Diagnosis not present

## 2021-01-20 DIAGNOSIS — F1120 Opioid dependence, uncomplicated: Secondary | ICD-10-CM | POA: Diagnosis not present

## 2021-01-20 MED ORDER — PREGABALIN 100 MG PO CAPS
100.0000 mg | ORAL_CAPSULE | Freq: Two times a day (BID) | ORAL | 1 refills | Status: DC
  Filled 2021-01-20: qty 180, 90d supply, fill #0

## 2021-01-20 MED ORDER — OXYCODONE-ACETAMINOPHEN 5-325 MG PO TABS
1.0000 | ORAL_TABLET | Freq: Three times a day (TID) | ORAL | 0 refills | Status: DC | PRN
  Filled 2021-01-20: qty 90, 30d supply, fill #0

## 2021-01-20 MED ORDER — OXYCODONE-ACETAMINOPHEN 5-325 MG PO TABS
1.0000 | ORAL_TABLET | Freq: Three times a day (TID) | ORAL | 0 refills | Status: DC | PRN
  Filled 2021-01-29: qty 90, 30d supply, fill #0

## 2021-01-26 ENCOUNTER — Telehealth: Payer: 59 | Admitting: Nurse Practitioner

## 2021-01-26 ENCOUNTER — Other Ambulatory Visit (HOSPITAL_COMMUNITY): Payer: Self-pay

## 2021-01-26 DIAGNOSIS — N3000 Acute cystitis without hematuria: Secondary | ICD-10-CM | POA: Diagnosis not present

## 2021-01-26 MED ORDER — CEPHALEXIN 500 MG PO CAPS: 500.0000 mg | ORAL_CAPSULE | Freq: Two times a day (BID) | ORAL | 0 refills | Status: DC

## 2021-01-26 MED ORDER — CYCLOBENZAPRINE HCL 5 MG PO TABS
5.0000 mg | ORAL_TABLET | Freq: Three times a day (TID) | ORAL | 0 refills | Status: DC | PRN
  Filled 2021-01-26: qty 60, 20d supply, fill #0

## 2021-01-26 NOTE — Progress Notes (Signed)

## 2021-01-27 ENCOUNTER — Other Ambulatory Visit (HOSPITAL_COMMUNITY): Payer: Self-pay

## 2021-01-29 ENCOUNTER — Other Ambulatory Visit (HOSPITAL_COMMUNITY): Payer: Self-pay

## 2021-02-08 ENCOUNTER — Other Ambulatory Visit (HOSPITAL_COMMUNITY): Payer: Self-pay

## 2021-02-08 MED FILL — Pregabalin Cap 100 MG: ORAL | 90 days supply | Qty: 180 | Fill #0 | Status: AC

## 2021-02-09 ENCOUNTER — Other Ambulatory Visit (HOSPITAL_COMMUNITY): Payer: Self-pay

## 2021-02-09 NOTE — Progress Notes (Addendum)
02/10/2021 ALL: She has noted improvement in migraine intensity and frequency. This is her 3rd procedure. She was having daily headaches and now having milder migraines about 4-5 times per month. Usually worse at end of Botox cycle. Kelly [name]  100mg  usually works for abortive therapy. She continues Ajovy.   11/10/2020 ALL: She returns for 2nd Botox procedure. She does note some improvement in the intensity of migraines. She feels that frequency is unchanged. She is using a full prescription of Nurtec every month. She continues Ajovy and topiramate.   08/06/2020 ALL: This is her first Botox procedure since 08/2018. She has near daily headaches with > 15 migrainous days. She is taking topiramate and Ajovy. Nurtec helps some with abortive therapy. She does have trouble with clenching.   Consent Form Botulism Toxin Injection For Chronic Migraine    Reviewed orally with patient, additionally signature is on file:  Botulism toxin has been approved by the Federal drug administration for treatment of chronic migraine. Botulism toxin does not cure chronic migraine and it may not be effective in some patients.  The administration of botulism toxin is accomplished by injecting a small amount of toxin into the muscles of the neck and head. Dosage must be titrated for each individual. Any benefits resulting from botulism toxin tend to wear off after 3 months with a repeat injection required if benefit is to be maintained. Injections are usually done every 3-4 months with maximum effect peak achieved by about 2 or 3 weeks. Botulism toxin is expensive and you should be sure of what costs you will incur resulting from the injection.  The side effects of botulism toxin use for chronic migraine may include:   -Transient, and usually mild, facial weakness with facial injections  -Transient, and usually mild, head or neck weakness with head/neck injections  -Reduction or loss of forehead facial animation due to  forehead muscle weakness  -Eyelid drooping  -Dry eye  -Pain at the site of injection or bruising at the site of injection  -Double vision  -Potential unknown long term risks   Contraindications: You should not have Botox if you are pregnant, nursing, allergic to albumin, have an infection, skin condition, or muscle weakness at the site of the injection, or have myasthenia gravis, Lambert-Eaton syndrome, or ALS.  It is also possible that as with any injection, there may be an allergic reaction or no effect from the medication. Reduced effectiveness after repeated injections is sometimes seen and rarely infection at the injection site may occur. All care will be taken to prevent these side effects. If therapy is given over a long time, atrophy and wasting in the muscle injected may occur. Occasionally the patient's become refractory to treatment because they develop antibodies to the toxin. In this event, therapy needs to be modified.  I have read the above information and consent to the administration of botulism toxin.    BOTOX PROCEDURE NOTE FOR MIGRAINE HEADACHE  Contraindications and precautions discussed with patient(above). Aseptic procedure was observed and patient tolerated procedure. Procedure performed by Debbora Presto, FNP-C.   The condition has existed for more than 6 months, and pt does not have a diagnosis of ALS, Myasthenia Gravis or Lambert-Eaton Syndrome.  Risks and benefits of injections discussed and pt agrees to proceed with the procedure.  Written consent obtained  These injections are medically necessary. Pt  receives good benefits from these injections. These injections do not cause sedations or hallucinations which the oral therapies may cause.  Description of procedure:  The patient was placed in a sitting position. The standard protocol was used for Botox as follows, with 5 units of Botox injected at each site:  -Procerus muscle, midline injection  -Corrugator  muscle, bilateral injection  -Frontalis muscle, bilateral injection, with 2 sites each side, medial injection was performed in the upper one third of the frontalis muscle, in the region vertical from the medial inferior edge of the superior orbital rim. The lateral injection was again in the upper one third of the forehead vertically above the lateral limbus of the cornea, 1.5 cm lateral to the medial injection site.  -Temporalis muscle injection, 4 sites, bilaterally. The first injection was 3 cm above the tragus of the ear, second injection site was 1.5 cm to 3 cm up from the first injection site in line with the tragus of the ear. The third injection site was 1.5-3 cm forward between the first 2 injection sites. The fourth injection site was 1.5 cm posterior to the second injection site. 5th site laterally in the temporalis  muscleat the level of the outer canthus.  -Occipitalis muscle injection, 3 sites, bilaterally. The first injection was done one half way between the occipital protuberance and the tip of the mastoid process behind the ear. The second injection site was done lateral and superior to the first, 1 fingerbreadth from the first injection. The third injection site was 1 fingerbreadth superiorly and medially from the first injection site.  -Cervical paraspinal muscle injection, 2 sites, bilaterally. The first injection site was 1 cm from the midline of the cervical spine, 3 cm inferior to the lower border of the occipital protuberance. The second injection site was 1.5 cm superiorly and laterally to the first injection site.  -Trapezius muscle injection was performed at 3 sites, bilaterally. The first injection site was in the upper trapezius muscle halfway between the inflection point of the neck, and the acromion. The second injection site was one half way between the acromion and the first injection site. The third injection was done between the first injection site and the inflection  point of the neck.     Will return for repeat injection in 3 months.   A total of 200 units of Botox was prepared, 155 units of Botox was injected as documented above, any Botox not injected was wasted. The patient tolerated the procedure well, there were no complications of the above procedure.

## 2021-02-10 ENCOUNTER — Ambulatory Visit (INDEPENDENT_AMBULATORY_CARE_PROVIDER_SITE_OTHER): Payer: 59 | Admitting: Family Medicine

## 2021-02-10 DIAGNOSIS — G43709 Chronic migraine without aura, not intractable, without status migrainosus: Secondary | ICD-10-CM | POA: Diagnosis not present

## 2021-02-10 NOTE — Progress Notes (Signed)
Botox- 200 units x 1 vial Lot: [id number] Expiration: 07/2023 NDC: 4276-7011-00  Bacteriostatic 0.9% Sodium Chloride- 37mL total Lot: [id number] Expiration: 01/20/2022 NDC: 6435-3912-25  Dx: Y34.621 B/B

## 2021-02-18 DIAGNOSIS — G4733 Obstructive sleep apnea (adult) (pediatric): Secondary | ICD-10-CM | POA: Diagnosis not present

## 2021-02-19 ENCOUNTER — Other Ambulatory Visit (HOSPITAL_COMMUNITY): Payer: Self-pay

## 2021-02-23 ENCOUNTER — Other Ambulatory Visit (HOSPITAL_COMMUNITY): Payer: Self-pay

## 2021-02-23 MED ORDER — OXYCODONE-ACETAMINOPHEN 5-325 MG PO TABS
1.0000 | ORAL_TABLET | Freq: Three times a day (TID) | ORAL | 0 refills | Status: DC | PRN
  Filled 2021-02-26: qty 90, 30d supply, fill #0
  Filled ????-??-??: fill #0

## 2021-02-25 ENCOUNTER — Other Ambulatory Visit (HOSPITAL_COMMUNITY): Payer: Self-pay

## 2021-02-26 ENCOUNTER — Other Ambulatory Visit (HOSPITAL_COMMUNITY): Payer: Self-pay

## 2021-03-09 ENCOUNTER — Other Ambulatory Visit (HOSPITAL_COMMUNITY): Payer: Self-pay

## 2021-03-09 MED FILL — Ubrogepant Tab 100 MG: ORAL | 30 days supply | Qty: 8 | Fill #2 | Status: AC

## 2021-03-22 ENCOUNTER — Other Ambulatory Visit: Payer: Self-pay

## 2021-03-22 DIAGNOSIS — E782 Mixed hyperlipidemia: Secondary | ICD-10-CM | POA: Diagnosis not present

## 2021-03-22 DIAGNOSIS — R519 Headache, unspecified: Secondary | ICD-10-CM | POA: Diagnosis not present

## 2021-03-22 DIAGNOSIS — N920 Excessive and frequent menstruation with regular cycle: Secondary | ICD-10-CM | POA: Diagnosis not present

## 2021-03-22 DIAGNOSIS — G8929 Other chronic pain: Secondary | ICD-10-CM | POA: Diagnosis not present

## 2021-03-22 DIAGNOSIS — E282 Polycystic ovarian syndrome: Secondary | ICD-10-CM | POA: Diagnosis not present

## 2021-03-22 DIAGNOSIS — R102 Pelvic and perineal pain: Secondary | ICD-10-CM | POA: Diagnosis not present

## 2021-03-22 MED FILL — Topiramate Tab 50 MG: ORAL | 90 days supply | Qty: 180 | Fill #1 | Status: AC

## 2021-03-22 MED FILL — Rosuvastatin Calcium Tab 20 MG: ORAL | 90 days supply | Qty: 90 | Fill #0 | Status: AC

## 2021-03-23 ENCOUNTER — Other Ambulatory Visit (HOSPITAL_COMMUNITY): Payer: Self-pay

## 2021-03-23 MED ORDER — CYCLOBENZAPRINE HCL 5 MG PO TABS
5.0000 mg | ORAL_TABLET | Freq: Three times a day (TID) | ORAL | 0 refills | Status: DC | PRN
  Filled 2021-03-23: qty 60, 20d supply, fill #0

## 2021-03-25 ENCOUNTER — Telehealth: Payer: Self-pay | Admitting: Neurology

## 2021-03-25 ENCOUNTER — Other Ambulatory Visit (HOSPITAL_COMMUNITY): Payer: Self-pay

## 2021-03-25 MED ORDER — OXYCODONE-ACETAMINOPHEN 5-325 MG PO TABS
1.0000 | ORAL_TABLET | Freq: Three times a day (TID) | ORAL | 0 refills | Status: DC
  Filled 2021-03-25 – 2021-03-26 (×2): qty 90, 30d supply, fill #0

## 2021-03-25 NOTE — Telephone Encounter (Signed)
Pa submitted through [facility] [id number] Approved immediately authorization is effective for a maximum of 12 fills from 03/25/2021 to 03/24/2022

## 2021-03-25 NOTE — Telephone Encounter (Signed)
Received fax from [place] that [place] approved 03/25/21-03/24/22 for max of 12 refills #6/30. PA ref# [id number].

## 2021-03-26 ENCOUNTER — Other Ambulatory Visit (HOSPITAL_COMMUNITY): Payer: Self-pay

## 2021-03-30 DIAGNOSIS — R102 Pelvic and perineal pain: Secondary | ICD-10-CM | POA: Diagnosis not present

## 2021-04-12 ENCOUNTER — Other Ambulatory Visit (HOSPITAL_COMMUNITY): Payer: Self-pay

## 2021-04-12 MED FILL — Ubrogepant Tab 100 MG: ORAL | 30 days supply | Qty: 8 | Fill #3 | Status: AC

## 2021-04-14 ENCOUNTER — Other Ambulatory Visit (HOSPITAL_COMMUNITY): Payer: Self-pay

## 2021-04-14 DIAGNOSIS — F1120 Opioid dependence, uncomplicated: Secondary | ICD-10-CM | POA: Diagnosis not present

## 2021-04-14 DIAGNOSIS — Z6829 Body mass index (BMI) 29.0-29.9, adult: Secondary | ICD-10-CM | POA: Diagnosis not present

## 2021-04-14 DIAGNOSIS — Q761 Klippel-Feil syndrome: Secondary | ICD-10-CM | POA: Diagnosis not present

## 2021-04-14 MED ORDER — OXYCODONE-ACETAMINOPHEN 5-325 MG PO TABS
1.0000 | ORAL_TABLET | Freq: Three times a day (TID) | ORAL | 0 refills | Status: DC | PRN
  Filled 2021-04-26: qty 90, 30d supply, fill #0

## 2021-04-14 MED ORDER — OXYCODONE-ACETAMINOPHEN 5-325 MG PO TABS: 1.0000 | ORAL_TABLET | Freq: Three times a day (TID) | ORAL | 0 refills | Status: DC | PRN

## 2021-04-23 ENCOUNTER — Other Ambulatory Visit (HOSPITAL_COMMUNITY): Payer: Self-pay

## 2021-04-23 MED ORDER — QUICKVUE AT-HOME COVID-19 TEST VI KIT
PACK | 0 refills | Status: DC
  Filled 2021-04-23: qty 2, 2d supply, fill #0

## 2021-04-27 ENCOUNTER — Other Ambulatory Visit (HOSPITAL_COMMUNITY): Payer: Self-pay

## 2021-04-27 ENCOUNTER — Telehealth: Payer: Self-pay | Admitting: Family Medicine

## 2021-04-27 NOTE — Telephone Encounter (Signed)
I called [facility] to obtain PA for patient's Botox appointment on 9/12. Phone: [phone]. I spoke with Lattie Haw. 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 She states CPT [phone] does not require authorization. 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 Lattie Haw advises that clinical information will need to be faxed to [phone] for review. The request is pending. Reference [id number].

## 2021-05-04 NOTE — Telephone Encounter (Signed)
Received approval from [facility]. [place] [id number] (05/10/21- 11/07/21).

## 2021-05-06 NOTE — Progress Notes (Signed)
05/10/2021 ALL: Kelly [name] returns for Botox. She is now receiving 4th round. Unfortunately, she did not note any improvement with last round of Botox. She reports headaches are back to near daily frequency. She is using a full prescription of Ubrelvy monthly and OTC analgesics regularly. She can not correlate worsening with any specific triggers. She has not continued Ajovy. She does continue topiramate '50mg'$  BID. May consider restarting Ajovy in 1-2 weeks if headaches do not respond to this Botox procedure. She continues CPAP therapy. She has not been seen for office follow up in the past year. She will schedule visit with me.   02/10/2021 ALL: She has noted improvement in migraine intensity and frequency. This is her 3rd procedure. She was having daily headaches and now having milder migraines about 4-5 times per month. Usually worse at end of Botox cycle. Ubrelvy  '100mg'$  usually works for abortive therapy. She continues Ajovy.   11/10/2020 ALL: She returns for 2nd Botox procedure. She does note some improvement in the intensity of migraines. She feels that frequency is unchanged. She is using a full prescription of Nurtec every month. She continues Ajovy and topiramate.   08/06/2020 ALL: This is her first Botox procedure since 08/2018. She has near daily headaches with > 15 migrainous days. She is taking topiramate and Ajovy. Nurtec helps some with abortive therapy. She does have trouble with clenching.    Consent Form Botulism Toxin Injection For Chronic Migraine  Reviewed orally with patient, additionally signature is on file:  Botulism toxin has been approved by the Federal drug administration for treatment of chronic migraine. Botulism toxin does not cure chronic migraine and it may not be effective in some patients.  The administration of botulism toxin is accomplished by injecting a small amount of toxin into the muscles of the neck and head. Dosage must be titrated for each individual. Any  benefits resulting from botulism toxin tend to wear off after 3 months with a repeat injection required if benefit is to be maintained. Injections are usually done every 3-4 months with maximum effect peak achieved by about 2 or 3 weeks. Botulism toxin is expensive and you should be sure of what costs you will incur resulting from the injection.  The side effects of botulism toxin use for chronic migraine may include:   -Transient, and usually mild, facial weakness with facial injections  -Transient, and usually mild, head or neck weakness with head/neck injections  -Reduction or loss of forehead facial animation due to forehead muscle weakness  -Eyelid drooping  -Dry eye  -Pain at the site of injection or bruising at the site of injection  -Double vision  -Potential unknown long term risks  Contraindications: You should not have Botox if you are pregnant, nursing, allergic to albumin, have an infection, skin condition, or muscle weakness at the site of the injection, or have myasthenia gravis, Lambert-Eaton syndrome, or ALS.  It is also possible that as with any injection, there may be an allergic reaction or no effect from the medication. Reduced effectiveness after repeated injections is sometimes seen and rarely infection at the injection site may occur. All care will be taken to prevent these side effects. If therapy is given over a long time, atrophy and wasting in the muscle injected may occur. Occasionally the patient's become refractory to treatment because they develop antibodies to the toxin. In this event, therapy needs to be modified.  I have read the above information and consent to the administration of botulism  toxin.   BOTOX PROCEDURE NOTE FOR MIGRAINE HEADACHE  Contraindications and precautions discussed with patient(above). Aseptic procedure was observed and patient tolerated procedure. Procedure performed by Debbora Presto, FNP-C.   The condition has existed for more than 6  months, and pt does not have a diagnosis of ALS, Myasthenia Gravis or Lambert-Eaton Syndrome.  Risks and benefits of injections discussed and pt agrees to proceed with the procedure.  Written consent obtained  These injections are medically necessary. Pt  receives good benefits from these injections. These injections do not cause sedations or hallucinations which the oral therapies may cause.   Description of procedure:  The patient was placed in a sitting position. The standard protocol was used for Botox as follows, with 5 units of Botox injected at each site:  -Procerus muscle, midline injection  -Corrugator muscle, bilateral injection  -Frontalis muscle, bilateral injection, with 2 sites each side, medial injection was performed in the upper one third of the frontalis muscle, in the region vertical from the medial inferior edge of the superior orbital rim. The lateral injection was again in the upper one third of the forehead vertically above the lateral limbus of the cornea, 1.5 cm lateral to the medial injection site.  -Temporalis muscle injection, 4 sites, bilaterally. The first injection was 3 cm above the tragus of the ear, second injection site was 1.5 cm to 3 cm up from the first injection site in line with the tragus of the ear. The third injection site was 1.5-3 cm forward between the first 2 injection sites. The fourth injection site was 1.5 cm posterior to the second injection site. 5th site laterally in the temporalis  muscleat the level of the outer canthus.  -Occipitalis muscle injection, 3 sites, bilaterally. The first injection was done one half way between the occipital protuberance and the tip of the mastoid process behind the ear. The second injection site was done lateral and superior to the first, 1 fingerbreadth from the first injection. The third injection site was 1 fingerbreadth superiorly and medially from the first injection site.  -Cervical paraspinal muscle  injection, 2 sites, bilaterally. The first injection site was 1 cm from the midline of the cervical spine, 3 cm inferior to the lower border of the occipital protuberance. The second injection site was 1.5 cm superiorly and laterally to the first injection site.  -Trapezius muscle injection was performed at 3 sites, bilaterally. The first injection site was in the upper trapezius muscle halfway between the inflection point of the neck, and the acromion. The second injection site was one half way between the acromion and the first injection site. The third injection was done between the first injection site and the inflection point of the neck.     Will return for repeat injection in 3 months.   A total of 200 units of Botox was prepared, 155 units of Botox was injected as documented above, 45 units was wasted. The patient tolerated the procedure well, there were no complications of the above procedure.

## 2021-05-07 ENCOUNTER — Other Ambulatory Visit (HOSPITAL_COMMUNITY): Payer: Self-pay

## 2021-05-10 ENCOUNTER — Other Ambulatory Visit: Payer: Self-pay

## 2021-05-10 ENCOUNTER — Ambulatory Visit (INDEPENDENT_AMBULATORY_CARE_PROVIDER_SITE_OTHER): Payer: 59 | Admitting: Family Medicine

## 2021-05-10 DIAGNOSIS — G43709 Chronic migraine without aura, not intractable, without status migrainosus: Secondary | ICD-10-CM

## 2021-05-10 NOTE — Progress Notes (Signed)
Botox- 200 units x 1 vial Lot: [id number] Expiration: 09/2023 NDC: 3795-5831-67  Bacteriostatic 0.9% Sodium Chloride- 35mL total Lot: [id number] Expiration: 06/08/2021 NDC: 8948-3475-83  Dx: E74.600 B/B

## 2021-05-11 ENCOUNTER — Other Ambulatory Visit (HOSPITAL_COMMUNITY): Payer: Self-pay

## 2021-05-11 ENCOUNTER — Other Ambulatory Visit: Payer: Self-pay | Admitting: Medical

## 2021-05-11 ENCOUNTER — Other Ambulatory Visit: Payer: Self-pay

## 2021-05-11 ENCOUNTER — Other Ambulatory Visit (INDEPENDENT_AMBULATORY_CARE_PROVIDER_SITE_OTHER): Payer: Self-pay | Admitting: Physician Assistant

## 2021-05-11 MED ORDER — CYCLOBENZAPRINE HCL 5 MG PO TABS
5.0000 mg | ORAL_TABLET | Freq: Three times a day (TID) | ORAL | 0 refills | Status: DC | PRN
  Filled 2021-05-11: qty 60, 20d supply, fill #0

## 2021-05-11 MED FILL — Ubrogepant Tab 100 MG: ORAL | 30 days supply | Qty: 8 | Fill #4 | Status: CN

## 2021-05-12 ENCOUNTER — Other Ambulatory Visit: Payer: Self-pay

## 2021-05-12 ENCOUNTER — Other Ambulatory Visit: Payer: Self-pay | Admitting: Family Medicine

## 2021-05-12 ENCOUNTER — Other Ambulatory Visit (HOSPITAL_COMMUNITY): Payer: Self-pay

## 2021-05-12 MED FILL — Ubrogepant Tab 100 MG: ORAL | 30 days supply | Qty: 8 | Fill #4 | Status: AC

## 2021-05-17 ENCOUNTER — Other Ambulatory Visit: Payer: Self-pay | Admitting: Family Medicine

## 2021-05-17 ENCOUNTER — Ambulatory Visit (INDEPENDENT_AMBULATORY_CARE_PROVIDER_SITE_OTHER): Payer: 59 | Admitting: Nurse Practitioner

## 2021-05-17 ENCOUNTER — Other Ambulatory Visit: Payer: Self-pay

## 2021-05-17 ENCOUNTER — Encounter: Payer: Self-pay | Admitting: Nurse Practitioner

## 2021-05-17 ENCOUNTER — Other Ambulatory Visit (HOSPITAL_COMMUNITY): Payer: Self-pay

## 2021-05-17 VITALS — BP 122/74

## 2021-05-17 DIAGNOSIS — B009 Herpesviral infection, unspecified: Secondary | ICD-10-CM

## 2021-05-17 DIAGNOSIS — N898 Other specified noninflammatory disorders of vagina: Secondary | ICD-10-CM

## 2021-05-17 LAB — WET PREP FOR TRICH, YEAST, CLUE

## 2021-05-17 MED ORDER — VALACYCLOVIR HCL 1 G PO TABS
1000.0000 mg | ORAL_TABLET | Freq: Every day | ORAL | 1 refills | Status: DC
  Filled 2021-05-17: qty 90, 90d supply, fill #0
  Filled 2021-08-17: qty 90, 90d supply, fill #1

## 2021-05-17 NOTE — Progress Notes (Signed)
   Acute Office Visit  Subjective:    Patient ID: Kelly [name], female    DOB: [birth year], 55 y.o.   MRN: [id number]   HPI 55 y.o. presents today for vaginal irritation, discharge and odor. She has felt bumps in her perineum that are painful and burn with urination. 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 She has been under a lot of stress due to sister's health.    Review of Systems  Constitutional: Negative.   Genitourinary:  Positive for genital sores, vaginal discharge and vaginal pain.      Objective:    Physical Exam Constitutional:      Appearance: Normal appearance.  Genitourinary:    Vagina: Vaginal discharge present. No erythema.     Cervix: Normal.      BP 122/74  Wt Readings from Last 3 Encounters:  10/15/20 177 lb (80.3 kg)  09/24/20 175 lb (79.4 kg)  07/24/20 175 lb (79.4 kg)   Wet prep negative     Assessment & Plan:   Problem List Items Addressed This Visit   None Visit Diagnoses     HSV infection    -  Primary   Relevant Medications   valACYclovir (VALTREX) 1000 MG tablet   Vaginal discharge       Relevant Orders   WET PREP FOR [place], YEAST, CLUE      Plan: Lesions present on exam consistent with HSV outbreak. She is interested in taking suppressive medication to prevent transmissions. Valtrex 1000 mg daily provided. Wet prep unremarkable.      [place], 4:44 PM 05/17/2021

## 2021-05-18 ENCOUNTER — Ambulatory Visit: Payer: 59 | Admitting: Obstetrics & Gynecology

## 2021-05-18 ENCOUNTER — Other Ambulatory Visit (HOSPITAL_COMMUNITY): Payer: Self-pay

## 2021-05-18 MED ORDER — PREGABALIN 100 MG PO CAPS
100.0000 mg | ORAL_CAPSULE | Freq: Two times a day (BID) | ORAL | 1 refills | Status: DC
  Filled 2021-05-18: qty 180, 90d supply, fill #0
  Filled 2021-08-17: qty 180, 90d supply, fill #1

## 2021-05-19 ENCOUNTER — Other Ambulatory Visit (HOSPITAL_COMMUNITY): Payer: Self-pay

## 2021-05-21 ENCOUNTER — Other Ambulatory Visit (HOSPITAL_COMMUNITY): Payer: Self-pay

## 2021-05-21 ENCOUNTER — Other Ambulatory Visit: Payer: Self-pay | Admitting: Family Medicine

## 2021-05-26 ENCOUNTER — Other Ambulatory Visit: Payer: Self-pay

## 2021-05-26 ENCOUNTER — Other Ambulatory Visit (HOSPITAL_COMMUNITY): Payer: Self-pay

## 2021-05-26 ENCOUNTER — Ambulatory Visit (INDEPENDENT_AMBULATORY_CARE_PROVIDER_SITE_OTHER): Payer: 59 | Admitting: Obstetrics & Gynecology

## 2021-05-26 ENCOUNTER — Encounter: Payer: Self-pay | Admitting: Obstetrics & Gynecology

## 2021-05-26 VITALS — BP 122/80 | HR 87 | Resp 16 | Ht 63.0 in | Wt 172.0 lb

## 2021-05-26 DIAGNOSIS — Z6830 Body mass index (BMI) 30.0-30.9, adult: Secondary | ICD-10-CM

## 2021-05-26 DIAGNOSIS — E6609 Other obesity due to excess calories: Secondary | ICD-10-CM | POA: Diagnosis not present

## 2021-05-26 DIAGNOSIS — Z01419 Encounter for gynecological examination (general) (routine) without abnormal findings: Secondary | ICD-10-CM

## 2021-05-26 DIAGNOSIS — Z30431 Encounter for routine checking of intrauterine contraceptive device: Secondary | ICD-10-CM

## 2021-05-26 MED ORDER — OXYCODONE-ACETAMINOPHEN 5-325 MG PO TABS
1.0000 | ORAL_TABLET | Freq: Three times a day (TID) | ORAL | 0 refills | Status: DC | PRN
  Filled 2021-05-26: qty 90, 30d supply, fill #0

## 2021-05-26 MED ORDER — PHENTERMINE HCL 37.5 MG PO TABS
37.5000 mg | ORAL_TABLET | Freq: Every day | ORAL | 2 refills | Status: DC
  Filled 2021-05-26: qty 30, 30d supply, fill #0
  Filled 2021-06-29: qty 30, 30d supply, fill #1
  Filled 2021-07-22 – 2021-08-05 (×2): qty 30, 30d supply, fill #2

## 2021-05-26 NOTE — Progress Notes (Signed)
[place] [birth year] [id number]   History:    55 y.o. Q3F3L4T6  Boyfriend x 2 years   RP:  Established patient presenting for annual gyn exam    HPI: Well on Mirena IUD inserted on 07/11/2018.  Rare mild menses.  No hot flushes. No pelvic pain.  No pain with IC.  Breasts normal.  Breasts normal.  BMI at 30.47.  Physically active. Fasting Health labs ordered, to be done in her [facility] office.    Past medical history,surgical history, family history and social history were all reviewed and documented in the EPIC chart.  Gynecologic History No LMP recorded. (Menstrual status: IUD).  Obstetric History OB History  Gravida Para Term Preterm AB Living  _0 SAB IAB Ectopic Multiple Live Births          3    # Outcome Date GA Lbr Len/2nd Weight Sex Delivery Anes PTL Lv  4 AB           3 Term     F CS-Unspec  N LIV  2 Term     F CS-Unspec  N LIV  1 Term     M CS-Unspec  N LIV     ROS: A ROS was performed and pertinent positives and negatives are included in the history. GENERAL: No fevers or chills. HEENT: No change in vision, no earache, sore throat or sinus congestion. NECK: No pain or stiffness. CARDIOVASCULAR: No chest pain or pressure. No palpitations. PULMONARY: No shortness of breath, cough or wheeze. GASTROINTESTINAL: No abdominal pain, nausea, vomiting or diarrhea, melena or bright red blood per rectum. GENITOURINARY: No urinary frequency, urgency, hesitancy or dysuria. MUSCULOSKELETAL: No joint or muscle pain, no back pain, no recent trauma. DERMATOLOGIC: No rash, no itching, no lesions. ENDOCRINE: No polyuria, polydipsia, no heat or cold intolerance. No recent change in weight. HEMATOLOGICAL: No anemia or easy bruising or bleeding. NEUROLOGIC: No headache, seizures, numbness, tingling or weakness. PSYCHIATRIC: No depression, no loss of interest in normal activity or change in sleep pattern.     Exam:   BP 122/80   Pulse 87   Resp 16   Ht 5' 3" (1.6 m)   Wt 172  lb (78 kg)   BMI 30.47 kg/m   General appearance : Well developed well nourished female. No acute distress HEENT: Eyes: no retinal hemorrhage or exudates,  Neck supple, trachea midline, no carotid bruits, no thyroidmegaly Lungs: Clear to auscultation, no rhonchi or wheezes, or rib retractions  Heart: Regular rate and rhythm, no murmurs or gallops Breast:Examined in sitting and supine position were symmetrical in appearance, no palpable masses or tenderness,  no skin retraction, no nipple inversion, no nipple discharge, no skin discoloration, no axillary or supraclavicular lymphadenopathy Abdomen: no palpable masses or tenderness, no rebound or guarding Extremities: no edema or skin discoloration or tenderness  Pelvic: Vulva: Normal             Vagina: No gross lesions or discharge  Cervix: No gross lesions or discharge.  [facility]UD strings felt at the [facility].  Uterus  AV, normal size, shape and consistency, non-tender and mobile  Adnexa  Without masses or tenderness  Anus: Normal   Assessment/Plan:  55 y.o. female for annual exam   1. Well female exam with routine gynecological exam Normal gynecologic exam.  Pap reflex Neg 10/2019, will repeat at 2-3 yrs.  Breasts normal.  Screening mammo neg 10/2020.  [place] 2021.  Health  labs at her [facility] office. - CBC; Future - Comp Met (CMET); Future - TSH; Future - Vitamin D 1,25 dihydroxy; Future - Lipid Profile; Future  2. Encounter for routine checking of intrauterine contraceptive device (IUD) Well on Mirena IUD x 2019.  IUD in good position.    3. Class 1 obesity due to excess calories with serious comorbidity and body mass index (BMI) of 30.0 to 30.9 in adult Counseling on weight loss.  Decision to restart on Phentermine.  No CI.  Prescription sent to pharmacy.  Low carb/calorie diet.  Aerobic activities 5 times a week with light weight lifting every 2 days.  Other orders - OnabotulinumtoxinA (BOTOX IJ); Inject as directed. - phentermine (ADIPEX-P)  37.5 MG tablet; Take 1 tablet (37.5 mg total) by mouth daily before breakfast.   [place]  MD, 12:15 PM 05/26/2021    

## 2021-05-28 ENCOUNTER — Other Ambulatory Visit: Payer: Self-pay | Admitting: Internal Medicine

## 2021-05-28 ENCOUNTER — Other Ambulatory Visit (HOSPITAL_COMMUNITY): Payer: Self-pay

## 2021-05-28 DIAGNOSIS — Z01419 Encounter for gynecological examination (general) (routine) without abnormal findings: Secondary | ICD-10-CM | POA: Diagnosis not present

## 2021-05-28 MED ORDER — METOPROLOL TARTRATE 50 MG PO TABS
50.0000 mg | ORAL_TABLET | Freq: Two times a day (BID) | ORAL | 3 refills | Status: DC
  Filled 2021-05-28 – 2021-07-22 (×3): qty 60, 30d supply, fill #0
  Filled 2021-08-17: qty 60, 30d supply, fill #1
  Filled 2021-09-17: qty 60, 30d supply, fill #2
  Filled 2021-11-15: qty 60, 30d supply, fill #3

## 2021-05-28 NOTE — Addendum Note (Signed)
Addended by: Minette Headland A on: 05/28/2021 09:21 AM   Modules accepted: Orders

## 2021-06-01 DIAGNOSIS — G4733 Obstructive sleep apnea (adult) (pediatric): Secondary | ICD-10-CM | POA: Diagnosis not present

## 2021-06-07 LAB — COMPREHENSIVE METABOLIC PANEL
ALT: 18 IU/L (ref 0–32)
AST: 22 IU/L (ref 0–40)
Albumin/Globulin Ratio: 1.9 (ref 1.2–2.2)
Albumin: 4.2 g/dL (ref 3.8–4.9)
Alkaline Phosphatase: 94 IU/L (ref 44–121)
BUN/Creatinine Ratio: 12 (ref 9–23)
BUN: 10 mg/dL (ref 6–24)
Bilirubin Total: 0.3 mg/dL (ref 0.0–1.2)
CO2: 17 mmol/L — ABNORMAL LOW (ref 20–29)
Calcium: 9.3 mg/dL (ref 8.7–10.2)
Chloride: 107 mmol/L — ABNORMAL HIGH (ref 96–106)
Creatinine, Ser: 0.82 mg/dL (ref 0.57–1.00)
Globulin, Total: 2.2 g/dL (ref 1.5–4.5)
Glucose: 84 mg/dL (ref 70–99)
Potassium: 4.2 mmol/L (ref 3.5–5.2)
Sodium: 140 mmol/L (ref 134–144)
Total Protein: 6.4 g/dL (ref 6.0–8.5)
eGFR: 85 mL/min/{1.73_m2} (ref >59)

## 2021-06-07 LAB — LIPID PANEL
Chol/HDL Ratio: 2.3 ratio (ref 0.0–4.4)
Cholesterol, Total: 128 mg/dL (ref 100–199)
HDL: 55 mg/dL (ref >39)
LDL Chol Calc (NIH): 50 mg/dL (ref 0–99)
Triglycerides: 131 mg/dL (ref 0–149)
VLDL Cholesterol Cal: 23 mg/dL (ref 5–40)

## 2021-06-07 LAB — CBC
Hematocrit: 37.5 % (ref 34.0–46.6)
Hemoglobin: 12.4 g/dL (ref 11.1–15.9)
MCH: 30.7 pg (ref 26.6–33.0)
MCHC: 33.1 g/dL (ref 31.5–35.7)
MCV: 93 fL (ref 79–97)
Platelets: 272 10*3/uL (ref 150–450)
RBC: 4.04 x10E6/uL (ref 3.77–5.28)
RDW: 13.1 % (ref 11.7–15.4)
WBC: 8 10*3/uL (ref 3.4–10.8)

## 2021-06-07 LAB — VITAMIN D 1,25 DIHYDROXY
Vitamin D 1, 25 (OH)2 Total: 72 pg/mL — ABNORMAL HIGH
Vitamin D2 1, 25 (OH)2: <10 pg/mL
Vitamin D3 1, 25 (OH)2: 72 pg/mL

## 2021-06-07 LAB — TSH: TSH: 3.63 u[IU]/mL (ref 0.450–4.500)

## 2021-06-17 ENCOUNTER — Ambulatory Visit (INDEPENDENT_AMBULATORY_CARE_PROVIDER_SITE_OTHER): Payer: 59 | Admitting: Family Medicine

## 2021-06-17 ENCOUNTER — Other Ambulatory Visit: Payer: Self-pay

## 2021-06-17 ENCOUNTER — Other Ambulatory Visit (HOSPITAL_COMMUNITY): Payer: Self-pay

## 2021-06-17 VITALS — HR 82 | Temp 98.3°F | Resp 98

## 2021-06-17 DIAGNOSIS — L03011 Cellulitis of right finger: Secondary | ICD-10-CM | POA: Diagnosis not present

## 2021-06-17 MED ORDER — CEPHALEXIN 500 MG PO CAPS
500.0000 mg | ORAL_CAPSULE | Freq: Three times a day (TID) | ORAL | 0 refills | Status: DC
Start: 2021-06-17 — End: 2021-08-09
  Filled 2021-06-17: qty 21, 7d supply, fill #0

## 2021-06-17 NOTE — Progress Notes (Signed)
   Subjective:    Patient ID: Kelly [name], female    DOB: 1965/09/01, 55 y.o.   MRN: [id number]  HPI She has a several day history of pain and swelling at the edge of the right third cuticle.  She is now tender over the tip of the finger.   Review of Systems     Objective:   Physical Exam Exam of the right third finger does show erythema and pain especially along the lateral aspect of the cubicle.  No evidence of abscess is present at this time.       Assessment & Plan:  Paronychia of finger of right hand - Plan: cephALEXin (KEFLEX) 500 MG capsule I explained that I did not think that there was any abscess forming but it could easily happen in the next day or so and if so she is to return here for I&D.

## 2021-06-21 ENCOUNTER — Other Ambulatory Visit: Payer: Self-pay

## 2021-06-21 ENCOUNTER — Other Ambulatory Visit (HOSPITAL_COMMUNITY): Payer: Self-pay

## 2021-06-21 MED FILL — Ubrogepant Tab 100 MG: ORAL | 30 days supply | Qty: 8 | Fill #5 | Status: AC

## 2021-06-21 MED FILL — Rosuvastatin Calcium Tab 20 MG: ORAL | 90 days supply | Qty: 90 | Fill #1 | Status: AC

## 2021-06-22 ENCOUNTER — Other Ambulatory Visit: Payer: Self-pay

## 2021-06-22 ENCOUNTER — Other Ambulatory Visit (HOSPITAL_COMMUNITY): Payer: Self-pay

## 2021-06-22 MED ORDER — TOPIRAMATE 50 MG PO TABS
50.0000 mg | ORAL_TABLET | Freq: Two times a day (BID) | ORAL | 3 refills | Status: DC
  Filled 2021-06-22: qty 180, 90d supply, fill #0
  Filled 2021-09-17: qty 180, 90d supply, fill #1
  Filled 2022-01-13: qty 180, 90d supply, fill #2
  Filled 2022-04-12: qty 180, 90d supply, fill #3

## 2021-06-24 ENCOUNTER — Other Ambulatory Visit (HOSPITAL_COMMUNITY): Payer: Self-pay

## 2021-06-24 MED ORDER — OXYCODONE-ACETAMINOPHEN 5-325 MG PO TABS
1.0000 | ORAL_TABLET | Freq: Three times a day (TID) | ORAL | 0 refills | Status: DC
  Filled 2021-06-24: qty 90, 30d supply, fill #0

## 2021-06-29 ENCOUNTER — Other Ambulatory Visit (HOSPITAL_COMMUNITY): Payer: Self-pay

## 2021-07-07 ENCOUNTER — Other Ambulatory Visit (HOSPITAL_COMMUNITY): Payer: Self-pay

## 2021-07-07 DIAGNOSIS — M5416 Radiculopathy, lumbar region: Secondary | ICD-10-CM | POA: Diagnosis not present

## 2021-07-07 DIAGNOSIS — F1120 Opioid dependence, uncomplicated: Secondary | ICD-10-CM | POA: Diagnosis not present

## 2021-07-07 DIAGNOSIS — R200 Anesthesia of skin: Secondary | ICD-10-CM | POA: Diagnosis not present

## 2021-07-07 DIAGNOSIS — Q761 Klippel-Feil syndrome: Secondary | ICD-10-CM | POA: Diagnosis not present

## 2021-07-07 MED ORDER — OXYCODONE-ACETAMINOPHEN 5-325 MG PO TABS
1.0000 | ORAL_TABLET | Freq: Three times a day (TID) | ORAL | 0 refills | Status: DC | PRN
  Filled 2021-09-20: qty 90, 30d supply, fill #0

## 2021-07-07 MED ORDER — CYCLOBENZAPRINE HCL 5 MG PO TABS
5.0000 mg | ORAL_TABLET | Freq: Three times a day (TID) | ORAL | 0 refills | Status: DC | PRN
  Filled 2021-07-07: qty 60, 20d supply, fill #0

## 2021-07-07 MED ORDER — OXYCODONE-ACETAMINOPHEN 5-325 MG PO TABS
1.0000 | ORAL_TABLET | Freq: Three times a day (TID) | ORAL | 0 refills | Status: DC | PRN
  Filled 2021-07-23: qty 90, 30d supply, fill #0

## 2021-07-07 MED ORDER — PREGABALIN 100 MG PO CAPS
100.0000 mg | ORAL_CAPSULE | Freq: Two times a day (BID) | ORAL | 0 refills | Status: DC
  Filled 2021-07-07: qty 180, 180d supply, fill #0
  Filled 2021-08-05: qty 180, 90d supply, fill #0

## 2021-07-07 MED ORDER — OXYCODONE-ACETAMINOPHEN 5-325 MG PO TABS
1.0000 | ORAL_TABLET | Freq: Three times a day (TID) | ORAL | 0 refills | Status: DC | PRN
  Filled 2021-08-20: qty 90, 30d supply, fill #0
  Filled ????-??-??: fill #0

## 2021-07-09 ENCOUNTER — Encounter: Payer: Self-pay | Admitting: Family Medicine

## 2021-07-09 ENCOUNTER — Other Ambulatory Visit (HOSPITAL_COMMUNITY): Payer: Self-pay

## 2021-07-09 ENCOUNTER — Other Ambulatory Visit: Payer: Self-pay | Admitting: Medical

## 2021-07-09 MED ORDER — AMOXICILLIN-POT CLAVULANATE 875-125 MG PO TABS
1.0000 | ORAL_TABLET | Freq: Two times a day (BID) | ORAL | 0 refills | Status: AC
  Filled 2021-07-09: qty 28, 14d supply, fill #0

## 2021-07-22 ENCOUNTER — Other Ambulatory Visit (HOSPITAL_COMMUNITY): Payer: Self-pay

## 2021-07-22 MED FILL — Ubrogepant Tab 100 MG: ORAL | 30 days supply | Qty: 8 | Fill #6 | Status: AC

## 2021-07-23 ENCOUNTER — Other Ambulatory Visit: Payer: Self-pay | Admitting: Medical

## 2021-07-23 ENCOUNTER — Telehealth: Payer: Self-pay | Admitting: Internal Medicine

## 2021-07-23 ENCOUNTER — Other Ambulatory Visit (HOSPITAL_COMMUNITY): Payer: Self-pay

## 2021-07-23 MED ORDER — OSELTAMIVIR PHOSPHATE 75 MG PO CAPS
75.0000 mg | ORAL_CAPSULE | Freq: Every day | ORAL | 0 refills | Status: DC
  Filled 2021-07-23: qty 10, 10d supply, fill #0

## 2021-07-23 NOTE — Telephone Encounter (Signed)
Pt's daughter tested positive last night and was given tamiflu and said that she would need tamiflu as preventative and wants to know if you can prescribe this or does she need to do like a virtual with you to get it.

## 2021-07-23 NOTE — Telephone Encounter (Signed)
Pt.notified

## 2021-07-24 ENCOUNTER — Telehealth: Payer: 59 | Admitting: Emergency Medicine

## 2021-07-24 DIAGNOSIS — B379 Candidiasis, unspecified: Secondary | ICD-10-CM | POA: Diagnosis not present

## 2021-07-24 MED ORDER — FLUCONAZOLE 200 MG PO TABS: ORAL_TABLET | ORAL | 0 refills | Status: DC

## 2021-07-24 MED ORDER — CLOTRIMAZOLE 1 % VA CREA: 1.0000 | TOPICAL_CREAM | Freq: Every day | VAGINAL | 1 refills | Status: AC

## 2021-07-24 NOTE — Progress Notes (Signed)

## 2021-07-24 NOTE — Progress Notes (Signed)
I have spent 5 minutes in review of e-visit questionnaire, review and updating patient chart, medical decision making and response to patient.   Nirali Magouirk, PA-C    

## 2021-08-05 ENCOUNTER — Other Ambulatory Visit (HOSPITAL_COMMUNITY): Payer: Self-pay

## 2021-08-05 MED ORDER — CYCLOBENZAPRINE HCL 5 MG PO TABS
5.0000 mg | ORAL_TABLET | Freq: Three times a day (TID) | ORAL | 2 refills | Status: DC | PRN
  Filled 2021-08-05: qty 60, 20d supply, fill #0
  Filled 2021-09-17: qty 60, 20d supply, fill #1

## 2021-08-05 MED FILL — Metronidazole Cream 0.75%: CUTANEOUS | 30 days supply | Qty: 45 | Fill #0 | Status: AC

## 2021-08-05 MED FILL — Triamcinolone Acetonide Cream 0.1%: CUTANEOUS | 14 days supply | Qty: 45 | Fill #0 | Status: AC

## 2021-08-05 NOTE — Progress Notes (Signed)
08/09/2021 ALL: Kelly [name] returns for Botox. She does report improvement in migraine intensity and frequency since last visit. She is no longer going through full prescription of Ubrelvy. Stress levels have decreased. She continues topiramate 50mg  BID. She continues CPAP nightly but reports fighting with her machine. She does note benefit of using CPAP but does not like fighting with mask and tubing. She is very interested in [place]. We have discussed this device in the office today and she wishes to pursue additional workup with Dr Rexene Alberts. We will get her scheduled as she will probably need repeat sleep study. CPAP started 2017.   05/10/2021 ALL: Kelly [name] returns for Botox. She is now receiving 4th round. Unfortunately, she did not note any improvement with last round of Botox. She reports headaches are back to near daily frequency. She is using a full prescription of Ubrelvy monthly and OTC analgesics regularly. She can not correlate worsening with any specific triggers. She has not continued Ajovy. She does continue topiramate 50mg  BID. May consider restarting Ajovy in 1-2 weeks if headaches do not respond to this Botox procedure. She continues CPAP therapy. She has not been seen for office follow up in the past year. She will schedule visit with me.   02/10/2021 ALL: She has noted improvement in migraine intensity and frequency. This is her 3rd procedure. She was having daily headaches and now having milder migraines about 4-5 times per month. Usually worse at end of Botox cycle. Ubrelvy  100mg  usually works for abortive therapy. She continues Ajovy.   11/10/2020 ALL: She returns for 2nd Botox procedure. She does note some improvement in the intensity of migraines. She feels that frequency is unchanged. She is using a full prescription of Nurtec every month. She continues Ajovy and topiramate.   08/06/2020 ALL: This is her first Botox procedure since 08/2018. She has near daily headaches with > 15 migrainous  days. She is taking topiramate and Ajovy. Nurtec helps some with abortive therapy. She does have trouble with clenching.    Consent Form Botulism Toxin Injection For Chronic Migraine  Reviewed orally with patient, additionally signature is on file:  Botulism toxin has been approved by the Federal drug administration for treatment of chronic migraine. Botulism toxin does not cure chronic migraine and it may not be effective in some patients.  The administration of botulism toxin is accomplished by injecting a small amount of toxin into the muscles of the neck and head. Dosage must be titrated for each individual. Any benefits resulting from botulism toxin tend to wear off after 3 months with a repeat injection required if benefit is to be maintained. Injections are usually done every 3-4 months with maximum effect peak achieved by about 2 or 3 weeks. Botulism toxin is expensive and you should be sure of what costs you will incur resulting from the injection.  The side effects of botulism toxin use for chronic migraine may include:   -Transient, and usually mild, facial weakness with facial injections  -Transient, and usually mild, head or neck weakness with head/neck injections  -Reduction or loss of forehead facial animation due to forehead muscle weakness  -Eyelid drooping  -Dry eye  -Pain at the site of injection or bruising at the site of injection  -Double vision  -Potential unknown long term risks  Contraindications: You should not have Botox if you are pregnant, nursing, allergic to albumin, have an infection, skin condition, or muscle weakness at the site of the injection, or have myasthenia gravis,  Lambert-Eaton syndrome, or ALS.  It is also possible that as with any injection, there may be an allergic reaction or no effect from the medication. Reduced effectiveness after repeated injections is sometimes seen and rarely infection at the injection site may occur. All care will be  taken to prevent these side effects. If therapy is given over a long time, atrophy and wasting in the muscle injected may occur. Occasionally the patient's become refractory to treatment because they develop antibodies to the toxin. In this event, therapy needs to be modified.  I have read the above information and consent to the administration of botulism toxin.   BOTOX PROCEDURE NOTE FOR MIGRAINE HEADACHE  Contraindications and precautions discussed with patient(above). Aseptic procedure was observed and patient tolerated procedure. Procedure performed by Debbora Presto, FNP-C.   The condition has existed for more than 6 months, and pt does not have a diagnosis of ALS, Myasthenia Gravis or Lambert-Eaton Syndrome.  Risks and benefits of injections discussed and pt agrees to proceed with the procedure.  Written consent obtained  These injections are medically necessary. Pt  receives good benefits from these injections. These injections do not cause sedations or hallucinations which the oral therapies may cause.   Description of procedure:  The patient was placed in a sitting position. The standard protocol was used for Botox as follows, with 5 units of Botox injected at each site:  -Procerus muscle, midline injection  -Corrugator muscle, bilateral injection  -Frontalis muscle, bilateral injection, with 2 sites each side, medial injection was performed in the upper one third of the frontalis muscle, in the region vertical from the medial inferior edge of the superior orbital rim. The lateral injection was again in the upper one third of the forehead vertically above the lateral limbus of the cornea, 1.5 cm lateral to the medial injection site.  -Temporalis muscle injection, 4 sites, bilaterally. The first injection was 3 cm above the tragus of the ear, second injection site was 1.5 cm to 3 cm up from the first injection site in line with the tragus of the ear. The third injection site was 1.5-3 cm  forward between the first 2 injection sites. The fourth injection site was 1.5 cm posterior to the second injection site. 5th site laterally in the temporalis  muscleat the level of the outer canthus.  -Occipitalis muscle injection, 3 sites, bilaterally. The first injection was done one half way between the occipital protuberance and the tip of the mastoid process behind the ear. The second injection site was done lateral and superior to the first, 1 fingerbreadth from the first injection. The third injection site was 1 fingerbreadth superiorly and medially from the first injection site.  -Cervical paraspinal muscle injection, 2 sites, bilaterally. The first injection site was 1 cm from the midline of the cervical spine, 3 cm inferior to the lower border of the occipital protuberance. The second injection site was 1.5 cm superiorly and laterally to the first injection site.  -Trapezius muscle injection was performed at 3 sites, bilaterally. The first injection site was in the upper trapezius muscle halfway between the inflection point of the neck, and the acromion. The second injection site was one half way between the acromion and the first injection site. The third injection was done between the first injection site and the inflection point of the neck.     Will return for repeat injection in 3 months.   A total of 200 units of Botox was prepared, 155 units  of Botox was injected as documented above, 45 units was wasted. The patient tolerated the procedure well, there were no complications of the above procedure.

## 2021-08-06 ENCOUNTER — Other Ambulatory Visit (HOSPITAL_COMMUNITY): Payer: Self-pay

## 2021-08-09 ENCOUNTER — Ambulatory Visit (INDEPENDENT_AMBULATORY_CARE_PROVIDER_SITE_OTHER): Payer: 59 | Admitting: Family Medicine

## 2021-08-09 DIAGNOSIS — G43709 Chronic migraine without aura, not intractable, without status migrainosus: Secondary | ICD-10-CM | POA: Diagnosis not present

## 2021-08-09 NOTE — Progress Notes (Signed)
Botox- 200 units x 1 vial Lot: [id number] Expiration: 04/2024 NDC: 0404-5913-68  Bacteriostatic 0.9% Sodium Chloride- 48mL total Lot: [id number] Expiration: 03/23/23 NDC: 1443-6016-58  Dx: K06.349 B/B

## 2021-08-17 ENCOUNTER — Other Ambulatory Visit (HOSPITAL_COMMUNITY): Payer: Self-pay

## 2021-08-17 MED FILL — Ubrogepant Tab 100 MG: ORAL | 30 days supply | Qty: 8 | Fill #7 | Status: AC

## 2021-08-20 ENCOUNTER — Other Ambulatory Visit (HOSPITAL_COMMUNITY): Payer: Self-pay

## 2021-08-27 ENCOUNTER — Other Ambulatory Visit: Payer: Self-pay

## 2021-08-27 ENCOUNTER — Encounter: Payer: Self-pay | Admitting: Family Medicine

## 2021-08-27 ENCOUNTER — Telehealth (INDEPENDENT_AMBULATORY_CARE_PROVIDER_SITE_OTHER): Payer: 59 | Admitting: Family Medicine

## 2021-08-27 VITALS — Temp 100.3°F | Wt 166.0 lb

## 2021-08-27 DIAGNOSIS — J111 Influenza due to unidentified influenza virus with other respiratory manifestations: Secondary | ICD-10-CM

## 2021-08-27 MED ORDER — OSELTAMIVIR PHOSPHATE 75 MG PO CAPS: 75.0000 mg | ORAL_CAPSULE | Freq: Two times a day (BID) | ORAL | 0 refills | Status: DC

## 2021-08-27 NOTE — Progress Notes (Signed)
° °  Subjective:    Patient ID: Kelly [name], female    DOB: [birth year], 56 y.o.   MRN: [id number]  HPI Documentation for virtual audio and video telecommunications through [place] encounter: The patient was located at home. 2 patient identifiers used.  The provider was located in the office. The patient did consent to this visit and is aware of possible charges through their insurance for this visit. The other persons participating in this telemedicine service were none. Time spent on call was 5 minutes and in review of previous records >19 minutes total for counseling and coordination of care. This virtual service is not related to other E/M service within previous 7 days.  She states that she has a sudden onset of myalgias, arthralgias, temperature to 101, headache with left ear congestion that started last night after she left work.  No sore throat or coughing.  Review of Systems     Objective:   Physical Exam Alert and in no distress slightly toxic appearing       Assessment & Plan:  Influenza - Plan: oseltamivir (TAMIFLU) 75 MG capsule Also instructed her to use Tylenol for the fever aches and pains.  She may return to work when she is 24 hours fever free without an antipyretic.

## 2021-08-30 ENCOUNTER — Telehealth: Payer: Self-pay | Admitting: Family Medicine

## 2021-08-30 NOTE — Telephone Encounter (Signed)
Saturday morning tested positive for Covid  Worst sore throat ever, feels like pins and needles since Saturday.  Can't swallow, can't eat.  Is it possible I have strep throat? Is there something you can give me to help my throat?  Using Lidocaine Viscus, but the pain is deeper in throat.

## 2021-08-30 NOTE — Telephone Encounter (Signed)
Advised pt of same.  She is using Tylenol and Ibuprofen since Saturday.

## 2021-08-31 ENCOUNTER — Telehealth: Payer: Self-pay | Admitting: Family Medicine

## 2021-08-31 NOTE — Telephone Encounter (Signed)
Kelly [name] talked with Health at work, she began having one of her many coughing fits, had shortness of breath and then got dizzy.  [facility] at work said the sound of her cough that she may need chest xray or have someone listen to her chest.  Will you refer her for chest xray?

## 2021-09-03 ENCOUNTER — Telehealth: Payer: Self-pay

## 2021-09-03 ENCOUNTER — Ambulatory Visit (HOSPITAL_COMMUNITY)
Admission: RE | Admit: 2021-09-03 | Discharge: 2021-09-03 | Disposition: A | Discharge status: Home / Self Care | Payer: 59 | Source: Ambulatory Visit | Attending: Family Medicine | Admitting: Family Medicine

## 2021-09-03 ENCOUNTER — Other Ambulatory Visit: Payer: Self-pay

## 2021-09-03 DIAGNOSIS — U071 COVID-19: Secondary | ICD-10-CM | POA: Diagnosis not present

## 2021-09-03 NOTE — Telephone Encounter (Signed)
Patient called stating [facility] at work suggested she have chest X ray and possibly lungs listened to.  Patient reports shoulder pain 2-3 days worse in the evenings with heaviness in chest  Cough has improved but still has SHOB after coughing spell.  Cough is productive with brown sputum about 3-4 days.  Currently taking tessalon perles for cough and inhaler she continues to take.  Fever, Body aches, chills is better still has some chills but not as bad as she did.  Continues to be dizzy  Finished Paxlovid

## 2021-09-03 NOTE — Telephone Encounter (Signed)
See note from [facility]

## 2021-09-06 ENCOUNTER — Ambulatory Visit (INDEPENDENT_AMBULATORY_CARE_PROVIDER_SITE_OTHER): Payer: 59 | Admitting: Family Medicine

## 2021-09-06 ENCOUNTER — Other Ambulatory Visit: Payer: Self-pay

## 2021-09-06 ENCOUNTER — Ambulatory Visit: Payer: 59 | Admitting: Family Medicine

## 2021-09-06 ENCOUNTER — Encounter: Payer: Self-pay | Admitting: Family Medicine

## 2021-09-06 ENCOUNTER — Other Ambulatory Visit (HOSPITAL_COMMUNITY): Payer: Self-pay

## 2021-09-06 VITALS — BP 126/70 | HR 82 | Temp 98.7°F | Resp 20 | Wt 162.8 lb

## 2021-09-06 DIAGNOSIS — U071 Acute bronchitis due to other specified organisms: Secondary | ICD-10-CM

## 2021-09-06 DIAGNOSIS — J208 Acute bronchitis due to other specified organisms: Secondary | ICD-10-CM | POA: Diagnosis not present

## 2021-09-06 MED ORDER — AZITHROMYCIN 500 MG PO TABS
500.0000 mg | ORAL_TABLET | Freq: Every day | ORAL | 0 refills | Status: DC
  Filled 2021-09-06: qty 3, 3d supply, fill #0

## 2021-09-06 NOTE — Progress Notes (Signed)
° °  Subjective:    Patient ID: Kelly [name], female    DOB: 1966-02-24, 56 y.o.   MRN: [id number]  HPI She is here for a recheck.  She was diagnosed January 6 with COVID.  Since then she has had cough congestion DOE.  Presently she states that she is roughly 75% better but still having DOE .  No fever or chills at the present time.  She did have a recent x-ray which was negative.   Review of Systems     Objective:   Physical Exam Alert and in no distress .  Lungs are clear to auscultation.  Cardiac exam shows regular rhythm without murmurs or gallops.      Assessment & Plan:  Acute bronchitis due to COVID-19 virus - Plan: azithromycin (ZITHROMAX) 500 MG tablet We will touch bases again on Wednesday and decide whether she can return to work.  She is doing better but not to the extent that I would like.  I think antibiotic is appropriate at this time.  She was comfortable

## 2021-09-07 ENCOUNTER — Ambulatory Visit: Payer: 59 | Admitting: Family Medicine

## 2021-09-10 ENCOUNTER — Other Ambulatory Visit (HOSPITAL_COMMUNITY): Payer: Self-pay

## 2021-09-10 DIAGNOSIS — K6289 Other specified diseases of anus and rectum: Secondary | ICD-10-CM | POA: Diagnosis not present

## 2021-09-10 DIAGNOSIS — K603 Anal fistula: Secondary | ICD-10-CM | POA: Diagnosis not present

## 2021-09-10 DIAGNOSIS — K610 Anal abscess: Secondary | ICD-10-CM | POA: Diagnosis not present

## 2021-09-10 MED ORDER — AMOXICILLIN-POT CLAVULANATE 500-125 MG PO TABS
1.0000 | ORAL_TABLET | Freq: Two times a day (BID) | ORAL | 0 refills | Status: DC
  Filled 2021-09-10: qty 20, 10d supply, fill #0

## 2021-09-17 ENCOUNTER — Other Ambulatory Visit: Payer: Self-pay | Admitting: Family Medicine

## 2021-09-17 ENCOUNTER — Other Ambulatory Visit: Payer: Self-pay | Admitting: Cardiovascular Disease

## 2021-09-17 ENCOUNTER — Other Ambulatory Visit: Payer: Self-pay | Admitting: Obstetrics & Gynecology

## 2021-09-17 ENCOUNTER — Other Ambulatory Visit (HOSPITAL_COMMUNITY): Payer: Self-pay

## 2021-09-17 ENCOUNTER — Telehealth: Payer: Self-pay | Admitting: Family Medicine

## 2021-09-17 MED ORDER — FLUCONAZOLE 150 MG PO TABS
150.0000 mg | ORAL_TABLET | Freq: Once | ORAL | 0 refills | Status: AC
Start: 2021-09-17 — End: 2021-09-18
  Filled 2021-09-17: qty 1, 1d supply, fill #0

## 2021-09-17 MED ORDER — ROSUVASTATIN CALCIUM 20 MG PO TABS
20.0000 mg | ORAL_TABLET | Freq: Every day | ORAL | 1 refills | Status: DC
  Filled 2021-09-17: qty 90, 90d supply, fill #0
  Filled 2022-01-14: qty 90, 90d supply, fill #1

## 2021-09-17 NOTE — Telephone Encounter (Signed)
Last prescribed on 05/2021 at annual exam ,was prescribed phentermine 37.5 tablet #30 with 2 refills.

## 2021-09-17 NOTE — Telephone Encounter (Signed)
Pt finished zpac from you and now on antibiotics for 10 days from Dr. Benson Norway, feels like getting yeast infection and would like Diflucan called into [facility].

## 2021-09-20 ENCOUNTER — Other Ambulatory Visit (HOSPITAL_COMMUNITY): Payer: Self-pay

## 2021-09-21 ENCOUNTER — Other Ambulatory Visit (HOSPITAL_COMMUNITY): Payer: Self-pay

## 2021-09-21 MED ORDER — UBRELVY 100 MG PO TABS
ORAL_TABLET | ORAL | 11 refills | Status: DC
  Filled 2021-09-21: qty 8, 30d supply, fill #0
  Filled 2021-11-15: qty 8, 30d supply, fill #1
  Filled 2022-02-10: qty 8, 30d supply, fill #2
  Filled 2022-03-16: qty 8, 30d supply, fill #3
  Filled 2022-04-12: qty 8, 30d supply, fill #4
  Filled 2022-05-12: qty 8, 30d supply, fill #5
  Filled 2022-06-14: qty 8, 30d supply, fill #6
  Filled 2022-07-13: qty 8, 30d supply, fill #7

## 2021-09-28 ENCOUNTER — Other Ambulatory Visit (HOSPITAL_COMMUNITY): Payer: Self-pay

## 2021-10-05 ENCOUNTER — Telehealth: Payer: Self-pay | Admitting: Family Medicine

## 2021-10-05 NOTE — Telephone Encounter (Signed)
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 Clinical information faxed to [phone] for review. The request is pending. Reference [id number].

## 2021-10-06 ENCOUNTER — Other Ambulatory Visit (HOSPITAL_COMMUNITY): Payer: Self-pay

## 2021-10-06 DIAGNOSIS — Q761 Klippel-Feil syndrome: Secondary | ICD-10-CM | POA: Diagnosis not present

## 2021-10-06 DIAGNOSIS — F1120 Opioid dependence, uncomplicated: Secondary | ICD-10-CM | POA: Diagnosis not present

## 2021-10-06 DIAGNOSIS — M5412 Radiculopathy, cervical region: Secondary | ICD-10-CM | POA: Diagnosis not present

## 2021-10-06 MED ORDER — OXYCODONE-ACETAMINOPHEN 5-325 MG PO TABS
1.0000 | ORAL_TABLET | Freq: Three times a day (TID) | ORAL | 0 refills | Status: DC | PRN
  Filled 2021-10-20: qty 90, 30d supply, fill #0

## 2021-10-06 MED ORDER — PREGABALIN 100 MG PO CAPS
100.0000 mg | ORAL_CAPSULE | Freq: Two times a day (BID) | ORAL | 1 refills | Status: DC
Start: 2021-10-06 — End: 2022-02-24
  Filled 2021-10-06 – 2021-11-15 (×2): qty 180, 90d supply, fill #0
  Filled 2022-02-10: qty 180, 90d supply, fill #1

## 2021-10-06 MED ORDER — OXYCODONE-ACETAMINOPHEN 5-325 MG PO TABS: 1.0000 | ORAL_TABLET | Freq: Three times a day (TID) | ORAL | 0 refills | Status: DC | PRN

## 2021-10-06 MED ORDER — OXYCODONE-ACETAMINOPHEN 5-325 MG PO TABS
1.0000 | ORAL_TABLET | Freq: Three times a day (TID) | ORAL | 0 refills | Status: DC | PRN
Start: 2021-11-19 — End: 2021-12-06
  Filled 2021-11-19: qty 90, 30d supply, fill #0

## 2021-10-06 MED ORDER — CYCLOBENZAPRINE HCL 5 MG PO TABS
5.0000 mg | ORAL_TABLET | Freq: Three times a day (TID) | ORAL | 2 refills | Status: DC | PRN
  Filled 2021-10-06 – 2022-02-10 (×2): qty 60, 20d supply, fill #0
  Filled 2022-03-16: qty 60, 20d supply, fill #1

## 2021-10-13 ENCOUNTER — Other Ambulatory Visit (HOSPITAL_COMMUNITY): Payer: Self-pay

## 2021-10-18 ENCOUNTER — Other Ambulatory Visit (HOSPITAL_COMMUNITY): Payer: Self-pay

## 2021-10-19 ENCOUNTER — Other Ambulatory Visit (HOSPITAL_COMMUNITY): Payer: Self-pay

## 2021-10-20 ENCOUNTER — Other Ambulatory Visit (HOSPITAL_COMMUNITY): Payer: Self-pay

## 2021-10-21 ENCOUNTER — Other Ambulatory Visit (HOSPITAL_COMMUNITY): Payer: Self-pay

## 2021-10-25 ENCOUNTER — Telehealth: Payer: Self-pay | Admitting: Neurology

## 2021-10-25 NOTE — Telephone Encounter (Signed)
LVM and sent mychart msg informing pt of r/s needed- MD out 3/16. ?

## 2021-10-27 ENCOUNTER — Other Ambulatory Visit (HOSPITAL_COMMUNITY): Payer: Self-pay

## 2021-10-27 ENCOUNTER — Other Ambulatory Visit: Payer: Self-pay

## 2021-10-27 ENCOUNTER — Ambulatory Visit (INDEPENDENT_AMBULATORY_CARE_PROVIDER_SITE_OTHER): Payer: 59 | Admitting: Medical

## 2021-10-27 VITALS — BP 120/80 | HR 70 | Temp 97.3°F

## 2021-10-27 DIAGNOSIS — R350 Frequency of micturition: Secondary | ICD-10-CM | POA: Diagnosis not present

## 2021-10-27 DIAGNOSIS — R21 Rash and other nonspecific skin eruption: Secondary | ICD-10-CM | POA: Diagnosis not present

## 2021-10-27 DIAGNOSIS — R5383 Other fatigue: Secondary | ICD-10-CM

## 2021-10-27 DIAGNOSIS — I7300 Raynaud's syndrome without gangrene: Secondary | ICD-10-CM | POA: Diagnosis not present

## 2021-10-27 LAB — POCT URINALYSIS DIP (PROADVANTAGE DEVICE)
Bilirubin, UA: NEGATIVE
Glucose, UA: NEGATIVE mg/dL
Ketones, POC UA: NEGATIVE mg/dL
Nitrite, UA: NEGATIVE
Protein Ur, POC: 30 mg/dL — AB
Specific Gravity, Urine: 1.02
Urobilinogen, Ur: NEGATIVE
pH, UA: 7 (ref 5.0–8.0)

## 2021-10-27 MED ORDER — NITROFURANTOIN MONOHYD MACRO 100 MG PO CAPS
100.0000 mg | ORAL_CAPSULE | Freq: Two times a day (BID) | ORAL | 0 refills | Status: DC
  Filled 2021-10-27: qty 14, 7d supply, fill #0

## 2021-10-27 NOTE — Progress Notes (Signed)
Subjective: ? Kelly [name] is a 56 y.o. female who presents for ?Chief Complaint  ?Patient presents with  ? UTI  ?  Possible UTI- started yesterday frequency and some burning. Has blood in urine today. Last sexual encounter was 4 days ago and no issues then, had spotting last week but doesn't have a normal period.   ?   ?Here for possible UTI.  She notes lower abdominal discomfort, lots of urinary pressure, urine frequency.   Not sever burning.  Having some urgency.  No fever, no chills.  Has had some achiness.  No mid back pain.   Has hx/o kidney stones.   ? ?She notes concern for raynauds.  She has picture of her fingers showing some fingertips whitish while others appear pinkish.  She notes fingers feeling cold and in cold weather symptoms are worse.  She has also had unusual rashes on her chest in the past and other symptoms she gets concerns for autoimmune disease. ? ?No other aggravating or relieving factors.   ? ?No other c/o. ? ?Past Medical History:  ?Diagnosis Date  ? 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 Anemia   ? Anticardiolipin antibody positive   ? Cervicalgia   ? followed by dr Sherley Bounds  ? Depression   ? History of herpes genitalis   ? many yrs ago  ? History of kidney stones   ? Hyperlipidemia   ? Hypertension   ? followed by pcp   (11-25-2019  per pt had stress test approx. 2018 w/ Dr Wynonia Lawman, told normal)  ? Migraines   ? neurologist--- dr Jaynee Eagles  ? OSA on CPAP   ? TMJ (dislocation of temporomandibular joint)   ? 11-25-2019  jaw pops, no appliance  ? Wears glasses   ? ? ?The following portions of the patient's history were reviewed and updated as appropriate: allergies, current medications, past family history, past medical history, past social history, past surgical history and problem list. ? ?ROS ?Otherwise as in subjective above ? ?Objective: ?BP 120/80   Pulse 70   Temp (!) 97.3 ?F (36.3 ?C)  ? ?General appearance: alert, no distress, well developed, well nourished ?Abdomen: +bs, soft,  non tender, non distended, no masses, no hepatomegaly, no splenomegaly ?Back: no CVA tenderness ?Pulses: 2+ radial pulses, 2+ pedal pulses, normal cap refill ?Ext: no edema ?Picture on her phone suggestive of raynauds with some fingers whitish distally with others pink coloration ? ? ?Assessment: ?Encounter Diagnoses  ?Name Primary?  ? Frequency of urination Yes  ? Raynaud's phenomenon without gangrene   ? Rash   ? Other fatigue   ? ? ? ?Plan: ?Urinary frequency, likely UTI.  Begin Macrobid.  Sent urine for culture.  Hydrate well.  Call or recheck if worse symptoms.  I gave a urine strainer just in case she gets sediment debris suggestive of stone.  She notes remote history of kidney stones ? ?Raynaud's concern, prior unusual rashes, fatigue -referral back to rheumatology.  She was seen Dr. Estanislado Pandy in the past ? ?Kelly [name] was seen today for uti. ? ?Diagnoses and all orders for this visit: ? ?Frequency of urination ?-     POCT Urinalysis DIP (Proadvantage Device) ?-     Urine Culture ? ?Raynaud's phenomenon without gangrene ?-     Ambulatory referral to Rheumatology ? ?Rash ? ?Other fatigue ? ?Other orders ?-     nitrofurantoin, macrocrystal-monohydrate, (MACROBID) 100 MG capsule; Take 1 capsule (100 mg total) by mouth 2 (  two) times daily. ? ? ? ?Follow up: pending referral, urine culture ?

## 2021-10-29 LAB — URINE CULTURE

## 2021-11-03 ENCOUNTER — Ambulatory Visit: Payer: 59 | Admitting: Neurology

## 2021-11-08 ENCOUNTER — Telehealth: Payer: Self-pay | Admitting: Family Medicine

## 2021-11-08 ENCOUNTER — Ambulatory Visit (INDEPENDENT_AMBULATORY_CARE_PROVIDER_SITE_OTHER): Payer: 59 | Admitting: Family Medicine

## 2021-11-08 DIAGNOSIS — G43709 Chronic migraine without aura, not intractable, without status migrainosus: Secondary | ICD-10-CM

## 2021-11-08 NOTE — Telephone Encounter (Signed)
I have reviewed compliance data dated 10/08/2021-11/06/2021. She used CPAP 18/30 days for compliance of 60%. She used CPAP greater than 4 hours 16/30 days for compliance of 53%. Residual AHI was 0.9 on set pressure of 9cmH20. There was a large air leak of 26.6l/min noted. She feels that this correlates to fighting with her tubing. She would like to pursue visit with Dr Rexene Alberts to discuss possible use of Inspire for apnea management.  ?

## 2021-11-08 NOTE — Progress Notes (Signed)
Botox- 200 units x 1 vial ?Lot: [id number] ?Expiration: 04/2024 ?[place]: [phone] ? ?Bacteriostatic 0.9% Sodium Chloride- 51m total ?Lot: [id number]?Expiration: 03/23/2023 ?[place] [address]-68? ?Dx: GG64.847?B/B  ?

## 2021-11-08 NOTE — Progress Notes (Signed)
? ?11/08/2021 ALL: Kelly [name] returns for Botox. She continues topiramate '50mg'$  BID and Ubrelvy PRN. She continues to do well. She may have 4-5 migraines per month.  ? ?She was seen initially by Dr Leta Baptist in 2017 then transferred to Dr Jaynee Eagles in 2018 to start Botox therapy. In the interim, she was evaluated for sleep apnea by Dr Rexene Alberts. She was started on CPAP following sleep study and has continued therapy since. She has not had a formal follow up for CPAP therapy since original study. I did review her CPAP compliance report in 07/2021 at last Botox visit, however, she did not wish to continue therapy and asked to see Dr Rexene Alberts in follow up to discuss management of OSA with [facility]. Appt was scheduled in 10/2021 but needed to be rescheduled due to Dr Rexene Alberts being out of the office. She has been rescheduled with Dr Rexene Alberts in 12/2021. She continues to fight with CPAP nightly. She does not feel she is resting well due to having to fight with the tubing. She will discuss with Dr Rexene Alberts at upcoming appt.  ? ?08/09/2021 ALL: Kelly [name] returns for Botox. She does report improvement in migraine intensity and frequency since last visit. She is no longer going through full prescription of Ubrelvy. Stress levels have decreased. She continues topiramate '50mg'$  BID. She continues CPAP nightly but reports fighting with her machine. She does note benefit of using CPAP but does not like fighting with mask and tubing. She is very interested in [place]. We have discussed this device in the office today and she wishes to pursue additional workup with Dr Rexene Alberts. We will get her scheduled as she will probably need repeat sleep study. CPAP started 2017.  ? ?05/10/2021 ALL: Kelly [name] returns for Botox. She is now receiving 4th round. Unfortunately, she did not note any improvement with last round of Botox. She reports headaches are back to near daily frequency. She is using a full prescription of Ubrelvy monthly and OTC analgesics regularly. She can not  correlate worsening with any specific triggers. She has not continued Ajovy. She does continue topiramate '50mg'$  BID. May consider restarting Ajovy in 1-2 weeks if headaches do not respond to this Botox procedure. She continues CPAP therapy. She has not been seen for office follow up in the past year. She will schedule visit with me.  ? ?02/10/2021 ALL: She has noted improvement in migraine intensity and frequency. This is her 3rd procedure. She was having daily headaches and now having milder migraines about 4-5 times per month. Usually worse at end of Botox cycle. Ubrelvy  '100mg'$  usually works for abortive therapy. She continues Ajovy.  ? ?11/10/2020 ALL: She returns for 2nd Botox procedure. She does note some improvement in the intensity of migraines. She feels that frequency is unchanged. She is using a full prescription of Nurtec every month. She continues Ajovy and topiramate.  ? ?08/06/2020 ALL: This is her first Botox procedure since 08/2018. She has near daily headaches with > 15 migrainous days. She is taking topiramate and Ajovy. Nurtec helps some with abortive therapy. She does have trouble with clenching.  ? ? ?Consent Form ?Botulism Toxin Injection For Chronic Migraine ? ?Reviewed orally with patient, additionally signature is on file: ? ?Botulism toxin has been approved by the Federal drug administration for treatment of chronic migraine. Botulism toxin does not cure chronic migraine and it may not be effective in some patients. ? ?The administration of botulism toxin is accomplished by injecting a small amount of toxin into  the muscles of the neck and head. Dosage must be titrated for each individual. Any benefits resulting from botulism toxin tend to wear off after 3 months with a repeat injection required if benefit is to be maintained. Injections are usually done every 3-4 months with maximum effect peak achieved by about 2 or 3 weeks. Botulism toxin is expensive and you should be sure of what costs you  will incur resulting from the injection. ? ?The side effects of botulism toxin use for chronic migraine may include: ? ? -Transient, and usually mild, facial weakness with facial injections ? -Transient, and usually mild, head or neck weakness with head/neck injections ? -Reduction or loss of forehead facial animation due to forehead muscle weakness ? -Eyelid drooping ? -Dry eye ? -Pain at the site of injection or bruising at the site of injection ? -Double vision ? -Potential unknown long term risks ? ?Contraindications: You should not have Botox if you are pregnant, nursing, allergic to albumin, have an infection, skin condition, or muscle weakness at the site of the injection, or have myasthenia gravis, Lambert-Eaton syndrome, or ALS. ? ?It is also possible that as with any injection, there may be an allergic reaction or no effect from the medication. Reduced effectiveness after repeated injections is sometimes seen and rarely infection at the injection site may occur. All care will be taken to prevent these side effects. If therapy is given over a long time, atrophy and wasting in the muscle injected may occur. Occasionally the patient's become refractory to treatment because they develop antibodies to the toxin. In this event, therapy needs to be modified. ? ?I have read the above information and consent to the administration of botulism toxin. ? ? ?BOTOX PROCEDURE NOTE FOR MIGRAINE HEADACHE ? ?Contraindications and precautions discussed with patient(above). Aseptic procedure was observed and patient tolerated procedure. Procedure performed by Debbora Presto, FNP-C.  ? ?The condition has existed for more than 6 months, and pt does not have a diagnosis of ALS, Myasthenia Gravis or Lambert-Eaton Syndrome.  Risks and benefits of injections discussed and pt agrees to proceed with the procedure.  Written consent obtained ? ?These injections are medically necessary. Pt  receives good benefits from these injections. These  injections do not cause sedations or hallucinations which the oral therapies may cause. ? ? ?Description of procedure: ? ?The patient was placed in a sitting position. The standard protocol was used for Botox as follows, with 5 units of Botox injected at each site: ? ?-Procerus muscle, midline injection ? ?-Corrugator muscle, bilateral injection ? ?-Frontalis muscle, bilateral injection, with 2 sites each side, medial injection was performed in the upper one third of the frontalis muscle, in the region vertical from the medial inferior edge of the superior orbital rim. The lateral injection was again in the upper one third of the forehead vertically above the lateral limbus of the cornea, 1.5 cm lateral to the medial injection site. ? ?-Temporalis muscle injection, 4 sites, bilaterally. The first injection was 3 cm above the tragus of the ear, second injection site was 1.5 cm to 3 cm up from the first injection site in line with the tragus of the ear. The third injection site was 1.5-3 cm forward between the first 2 injection sites. The fourth injection site was 1.5 cm posterior to the second injection site. 5th site laterally in the temporalis  muscleat the level of the outer canthus. ? ?-Occipitalis muscle injection, 3 sites, bilaterally. The first injection was done one  half way between the occipital protuberance and the tip of the mastoid process behind the ear. The second injection site was done lateral and superior to the first, 1 fingerbreadth from the first injection. The third injection site was 1 fingerbreadth superiorly and medially from the first injection site. ? ?-Cervical paraspinal muscle injection, 2 sites, bilaterally. The first injection site was 1 cm from the midline of the cervical spine, 3 cm inferior to the lower border of the occipital protuberance. The second injection site was 1.5 cm superiorly and laterally to the first injection site. ? ?-Trapezius muscle injection was performed at 3  sites, bilaterally. The first injection site was in the upper trapezius muscle halfway between the inflection point of the neck, and the acromion. The second injection site was one half way between the acromio

## 2021-11-15 ENCOUNTER — Other Ambulatory Visit (HOSPITAL_COMMUNITY): Payer: Self-pay

## 2021-11-15 ENCOUNTER — Other Ambulatory Visit: Payer: Self-pay | Admitting: Nurse Practitioner

## 2021-11-15 DIAGNOSIS — B009 Herpesviral infection, unspecified: Secondary | ICD-10-CM

## 2021-11-16 ENCOUNTER — Other Ambulatory Visit (HOSPITAL_COMMUNITY): Payer: Self-pay

## 2021-11-16 ENCOUNTER — Other Ambulatory Visit: Payer: Self-pay | Admitting: Nurse Practitioner

## 2021-11-16 DIAGNOSIS — B009 Herpesviral infection, unspecified: Secondary | ICD-10-CM

## 2021-11-16 MED ORDER — VALACYCLOVIR HCL 1 G PO TABS
1000.0000 mg | ORAL_TABLET | Freq: Every day | ORAL | 1 refills | Status: DC
Start: 2021-11-16 — End: 2022-03-16
  Filled 2021-11-16: qty 90, 90d supply, fill #0
  Filled 2022-02-10: qty 90, 90d supply, fill #1

## 2021-11-16 NOTE — Telephone Encounter (Signed)
Last AEX 05/26/21.  ?

## 2021-11-16 NOTE — Telephone Encounter (Signed)
AEX 05/26/21. ?

## 2021-11-18 ENCOUNTER — Other Ambulatory Visit (HOSPITAL_COMMUNITY): Payer: Self-pay

## 2021-11-19 ENCOUNTER — Other Ambulatory Visit (HOSPITAL_COMMUNITY): Payer: Self-pay

## 2021-11-22 DIAGNOSIS — G4733 Obstructive sleep apnea (adult) (pediatric): Secondary | ICD-10-CM | POA: Diagnosis not present

## 2021-11-24 DIAGNOSIS — Z1231 Encounter for screening mammogram for malignant neoplasm of breast: Secondary | ICD-10-CM | POA: Diagnosis not present

## 2021-11-29 ENCOUNTER — Encounter: Payer: Self-pay | Admitting: Obstetrics & Gynecology

## 2021-12-03 DIAGNOSIS — G4733 Obstructive sleep apnea (adult) (pediatric): Secondary | ICD-10-CM | POA: Diagnosis not present

## 2021-12-05 ENCOUNTER — Emergency Department (HOSPITAL_BASED_OUTPATIENT_CLINIC_OR_DEPARTMENT_OTHER)
Admission: EM | Admit: 2021-12-05 | Discharge: 2021-12-05 | Disposition: A | Discharge status: Home / Self Care | Payer: 59 | Attending: Emergency Medicine | Admitting: Emergency Medicine

## 2021-12-05 ENCOUNTER — Other Ambulatory Visit: Payer: Self-pay

## 2021-12-05 ENCOUNTER — Encounter (HOSPITAL_BASED_OUTPATIENT_CLINIC_OR_DEPARTMENT_OTHER): Payer: Self-pay | Admitting: Emergency Medicine

## 2021-12-05 DIAGNOSIS — Z7982 Long term (current) use of aspirin: Secondary | ICD-10-CM | POA: Diagnosis not present

## 2021-12-05 DIAGNOSIS — K0889 Other specified disorders of teeth and supporting structures: Secondary | ICD-10-CM | POA: Insufficient documentation

## 2021-12-05 DIAGNOSIS — Z79899 Other long term (current) drug therapy: Secondary | ICD-10-CM | POA: Diagnosis not present

## 2021-12-05 MED ORDER — PENICILLIN V POTASSIUM 500 MG PO TABS: 500.0000 mg | ORAL_TABLET | Freq: Four times a day (QID) | ORAL | 0 refills | Status: AC

## 2021-12-05 MED ORDER — HYDROMORPHONE HCL 1 MG/ML IJ SOLN
1.0000 mg | Freq: Once | INTRAMUSCULAR | Status: AC
  Administered 2021-12-05: 1 mg via INTRAMUSCULAR
  Filled 2021-12-05: qty 1

## 2021-12-05 NOTE — Discharge Instructions (Addendum)
I am prescribing you an antibiotic called penicillin VK.  Please take this 4 times per day for the next 10 days for a possible dental infection.  Please call your dentist first thing tomorrow morning to schedule an appointment for reevaluation. ? ?If you develop any new or worsening symptoms please come back to the emergency department. ?

## 2021-12-05 NOTE — ED Triage Notes (Signed)
Pt reports upper RT side dental pain that started during the night; sts entire RT side face is painful ?

## 2021-12-05 NOTE — ED Provider Notes (Signed)
?[facility] EMER[facility]ENC[facility] DEPARTMENT ?Provider Note ? ? ?CSN: [id number] ?Arrival date & time: 12/05/21  1837 ? ?  ? ?History ? ?Chief Complaint  ?Patient presents with  ? Dental Pain  ? ? ?Kelly [name] is a 56 y.o. female. ? ?HPI ?Patient is a 56 year old female who presents to the emergency department due to right-sided dental pain that began last night.  Reports worsening pain with chewing and jaw movement.  Denies any sore throat, difficulty swallowing, fevers, chills, nausea, vomiting.  Patient has been doing salt water gargles as well as taking her prescribed Percocet with little relief. ?  ? ?Home Medications ?Prior to Admission medications   ?Medication Sig Start Date End Date Taking? Authorizing Provider  ?penicillin v potassium (VEETID) 500 MG tablet Take 1 tablet (500 mg total) by mouth 4 (four) times daily for 10 days. 12/05/21 12/15/21 Yes Rayna Sexton, PA-C  ?aspirin EC 81 MG tablet Take 1 tablet (81 mg total) by mouth daily. Swallow whole. 07/09/20   O'NealCassie Freer, MD  ?cyclobenzaprine (FLEXERIL) 5 MG tablet Take 1 tablet by mouth three times daily as needed for muscle spasm 10/06/21     ?levonorgestrel (MIRENA) 20 MCG/24HR IUD 1 each by Intrauterine route once.    [provider]  ?Lidocaine, Anorectal, 5 % CREA APPLY SMALL AMOUNT AS NEEDED TO RECTAL AREA 05/22/19   [name], MD  ?metoprolol tartrate (LOPRESSOR) 50 MG tablet Take 1 tablet (50 mg total) by mouth 2 (two) times daily. 05/28/21   O'NealCassie Freer, MD  ?Multiple Vitamin (MULTIVITAMIN WITH MINERALS) TABS Take 1 tablet by mouth daily.    [provider]  ?OnabotulinumtoxinA (BOTOX IJ) Inject as directed.    [provider]  ?oxyCODONE-acetaminophen (PERCOCET/ROXICET) 5-325 MG tablet Take 1 tablet by mouth every 8 (eight) hours as needed for pain (11-19-21) 11/19/21     ?oxyCODONE-acetaminophen (PERCOCET/ROXICET) 5-325 MG tablet Take 1 tablet by mouth every 8 (eight) hours as needed for  pain (10-20-21) 10/20/21     ?oxyCODONE-acetaminophen (PERCOCET/ROXICET) 5-325 MG tablet Take 1 tablet by mouth every 8 (eight) hours as needed. (12-19-21) 12/19/21     ?phentermine (ADIPEX-P) 37.5 MG tablet Take 1 tablet (37.5 mg total) by mouth daily before breakfast. 05/26/21   [name], MD  ?pregabalin (LYRICA) 100 MG capsule Take 1 capsule (100 mg total) by mouth 2 (two) times daily. 10/06/21     ?rosuvastatin (CRESTOR) 20 MG tablet Take 1 tablet (20 mg total) by mouth daily. 09/17/21   O'NealCassie Freer, MD  ?topiramate (TOPAMAX) 50 MG tablet Take 1 tablet (50 mg total) by mouth 2 (two) times daily. 06/22/21   Debbora Presto, NP  ?Ubrogepant (UBRELVY) 100 MG TABS TAKE 1 TABLET BY MOUTH DAILY AS NEEDED, TAKE 1 TABLET BY MOUTH AT ONSET OF HEADACHE, REPEAT IN 2 HOURS IF NEEDED, NO MORE THAN 2 TABS IN 24 HOURS 09/21/21 09/21/22  Lomax, Amy, NP  ?valACYclovir (VALTREX) 1000 MG tablet Take 1 tablet (1,000 mg total) by mouth daily. 11/16/21   Tamela Gammon, NP  ?   ? ?Allergies    ?Imitrex [sumatriptan base], Zomig, Avelox [moxifloxacin hcl in nacl], Morphine, Septra [bactrim], Sulfamethoxazole, Sumatriptan, and Trimethoprim   ? ?Review of Systems   ?Review of Systems  ?Constitutional:  Negative for chills and fever.  ?HENT:  Positive for dental problem. Negative for facial swelling, sore throat and trouble swallowing.   ?Gastrointestinal:  Negative for nausea and vomiting.  ? ?Physical Exam ?Updated Vital Signs ?  BP (!) 147/91   Pulse 68   Temp 98.2 ?F (36.8 ?C) (Oral)   Resp 20   Ht '5\' 4"'$  (1.626 m)   Wt 78.9 kg   SpO2 100%   BMI 29.87 kg/m?  ?Physical Exam ?Vitals and nursing note reviewed.  ?Constitutional:   ?   General: She is not in acute distress. ?   Appearance: She is well-developed.  ?HENT:  ?   Head: Normocephalic and atraumatic.  ?   Right Ear: External ear normal.  ?   Left Ear: External ear normal.  ?   Mouth/Throat:  ?   Mouth: Mucous membranes are moist.  ?   Pharynx: Oropharynx is clear. No  oropharyngeal exudate or posterior oropharyngeal erythema.  ?   Comments: Uvula midline.  No erythema or exudates noted in the posterior oropharynx.  Readily handling secretions.  No hot potato voice.  No stridor.  Moderate tenderness noted along the right lower first molar with surrounding tenderness on the gum just below the tooth.  No erythema, edema, or visible/palpable fluctuance noted.  Soft submental compartments. ?Eyes:  ?   General: No scleral icterus.    ?   Right eye: No discharge.     ?   Left eye: No discharge.  ?   Conjunctiva/sclera: Conjunctivae normal.  ?Neck:  ?   Trachea: No tracheal deviation.  ?Cardiovascular:  ?   Rate and Rhythm: Normal rate.  ?Pulmonary:  ?   Effort: Pulmonary effort is normal. No respiratory distress.  ?   Breath sounds: No stridor.  ?Abdominal:  ?   General: There is no distension.  ?Musculoskeletal:     ?   General: No swelling or deformity.  ?   Cervical back: Neck supple.  ?Skin: ?   General: Skin is warm and dry.  ?   Findings: No rash.  ?Neurological:  ?   Mental Status: She is alert.  ?   Cranial Nerves: Cranial nerve deficit: no gross deficits.  ? ?ED Results / Procedures / Treatments   ?Labs ?(all labs ordered are listed, but only abnormal results are displayed) ?Labs Reviewed - No data to display ? ?EKG ?None ? ?Radiology ?No results found. ? ?Procedures ?Procedures  ? ?Medications Ordered in ED ?Medications  ?HYDROmorphone (DILAUDID) injection 1 mg (has no administration in time range)  ? ? ?ED Course/ Medical Decision Making/ A&P ?  ?                        ?Medical Decision Making ?Risk ?Prescription drug management. ? ?Patient is a 56 year old female who presents to the emergency department due to right lower dental pain that began last night. ? ?On my exam patient has dental caries as well as pain with manipulation of the right lower first molar.  No surrounding erythema, edema, or visible/palpable fluctuance noted.  Does not appear amenable to I&D at this  time.  Uvula midline.  Soft submental compartments.  Readily handling secretions.  Doubt PTA or Ludwig's angina at this time. ? ?Given patient's chronic Percocet is not providing any relief of her pain, patient given a single dose of IM Dilaudid in the ED for her acute pain.  She has a ride home.  Will discharge on a course of penicillin VK.  Patient is going to follow-up with her dentist first thing tomorrow morning.  We discussed return precautions.  Patient appears stable for discharge at this time and she is agreeable.  Her questions were answered and she was amicable at the time of discharge. ?Final Clinical Impression(s) / ED Diagnoses ?Final diagnoses:  ?Pain, dental  ? ?Rx / DC Orders ?ED Discharge Orders   ? ?      Ordered  ?  penicillin v potassium (VEETID) 500 MG tablet  4 times daily       ? 12/05/21 1917  ? ?  ?  ? ?  ? ? ?  ?Rayna Sexton, PA-C ?12/05/21 1923 ? ?  ?Drenda Freeze, MD ?12/05/21 2251 ? ?

## 2021-12-06 ENCOUNTER — Telehealth: Payer: Self-pay

## 2021-12-06 ENCOUNTER — Ambulatory Visit (INDEPENDENT_AMBULATORY_CARE_PROVIDER_SITE_OTHER): Payer: 59 | Admitting: Family Medicine

## 2021-12-06 ENCOUNTER — Other Ambulatory Visit (HOSPITAL_COMMUNITY): Payer: Self-pay

## 2021-12-06 ENCOUNTER — Encounter: Payer: Self-pay | Admitting: Family Medicine

## 2021-12-06 VITALS — BP 114/80 | HR 72 | Temp 98.8°F | Ht 64.0 in | Wt 164.0 lb

## 2021-12-06 DIAGNOSIS — R519 Headache, unspecified: Secondary | ICD-10-CM

## 2021-12-06 MED ORDER — METHYLPREDNISOLONE 4 MG PO TBPK
ORAL_TABLET | ORAL | 1 refills | Status: DC
Start: 2021-12-06 — End: 2021-12-20
  Filled 2021-12-06: qty 21, 6d supply, fill #0

## 2021-12-06 NOTE — Progress Notes (Signed)
Chief Complaint  ?Patient presents with  ? Jaw Pain  ?  Right sided jaw pain that started Sat night-woke her up from her sleep. Upper and lower jaw pain. Just came from her dentist and he could not find anything wrong with her teeth, did exam and xrays. He thinks it may be start of abscess that he could not yet see or a sinus infection. Was seen @ [facility] yesterday and started on abx yesterday.   ? ?Sat night she had R sided jaw pain that woke her up.  Pain was initially thought to be upper tooth, but pain spreads down the jaw to the lower jaw.  Pain occasionally shoots up to the eye, ear, down neck. ? ?She went to the ER yesterday. Notes reviewed--noted worse with chewing and jaw movement. Tender R 1st molar lower, gum tender below tooth. Given dilaudid, which helped (due to taking percocet chronically). ?She was Rx'd Pen VK to cover tooth infection, abscess. ?She was able to sleep with the pain med last night. ?Throbs more when she lays flat. ?Pain is constant, but severity fluctuates. ? ?Saw dentist this morning, took x-rays, didn't see anything. ?She has h/o sinus infections, doesn't feel like that. Has mild allergies. ?She has h/o TMJ.  But she initially noted pain in more central cheek, by an upper tooth.  Gums were tender yesterday. ? ?She notes increased pain with cold air blowing on her (ie AC in car), even with talking, when inhaling. ?Dentist referred her to endodontist, has appt this afternoon. ?(if source not found by PCP.) ? ?She has been taking ibuprofen '800mg'$  BID-TID since Saturday ? ?PMH, PSH, SH reviewed ? ?Outpatient Encounter Medications as of 12/06/2021  ?Medication Sig Note  ? aspirin EC 81 MG tablet Take 1 tablet (81 mg total) by mouth daily. Swallow whole.   ? cyclobenzaprine (FLEXERIL) 5 MG tablet Take 1 tablet by mouth three times daily as needed for muscle spasm 12/06/2021: Uses qHS  ? ibuprofen (ADVIL) 800 MG tablet Take 800 mg by mouth every 8 (eight) hours as needed. 12/06/2021: Last  dose 10pm  ? levonorgestrel (MIRENA) 20 MCG/24HR IUD 1 each by Intrauterine route once. 11/25/2019: Per pt placed 2019  ? metoprolol tartrate (LOPRESSOR) 50 MG tablet Take 1 tablet (50 mg total) by mouth 2 (two) times daily.   ? Multiple Vitamin (MULTIVITAMIN WITH MINERALS) TABS Take 1 tablet by mouth daily.   ? OnabotulinumtoxinA (BOTOX IJ) Inject as directed.   ? [START ON 12/19/2021] oxyCODONE-acetaminophen (PERCOCET/ROXICET) 5-325 MG tablet Take 1 tablet by mouth every 8 (eight) hours as needed. (12-19-21) 12/06/2021: Usually BID, more with acute pain  ? penicillin v potassium (VEETID) 500 MG tablet Take 1 tablet (500 mg total) by mouth 4 (four) times daily for 10 days.   ? pregabalin (LYRICA) 100 MG capsule Take 1 capsule (100 mg total) by mouth 2 (two) times daily.   ? rosuvastatin (CRESTOR) 20 MG tablet Take 1 tablet (20 mg total) by mouth daily.   ? topiramate (TOPAMAX) 50 MG tablet Take 1 tablet (50 mg total) by mouth 2 (two) times daily.   ? Ubrogepant (UBRELVY) 100 MG TABS TAKE 1 TABLET BY MOUTH DAILY AS NEEDED, TAKE 1 TABLET BY MOUTH AT ONSET OF HEADACHE, REPEAT IN 2 HOURS IF NEEDED, NO MORE THAN 2 TABS IN 24 HOURS   ? [DISCONTINUED] oxyCODONE-acetaminophen (PERCOCET/ROXICET) 5-325 MG tablet Take 1 tablet by mouth every 8 (eight) hours as needed for pain (11-19-21)   ?  Lidocaine, Anorectal, 5 % CREA APPLY SMALL AMOUNT AS NEEDED TO RECTAL AREA (Patient not taking: Reported on 12/06/2021) 12/06/2021: As needed  ? valACYclovir (VALTREX) 1000 MG tablet Take 1 tablet (1,000 mg total) by mouth daily.   ? [DISCONTINUED] oxyCODONE-acetaminophen (PERCOCET/ROXICET) 5-325 MG tablet Take 1 tablet by mouth every 8 (eight) hours as needed for pain (10-20-21)   ? [DISCONTINUED] phentermine (ADIPEX-P) 37.5 MG tablet Take 1 tablet (37.5 mg total) by mouth daily before breakfast.   ? ?No facility-administered encounter medications on file as of 12/06/2021.  ? ?Allergies  ?Allergen Reactions  ? Imitrex [Sumatriptan Base] Other (See  Comments)  ?  Esophageal discomfort most likely serotonin effect  ? Zomig Other (See Comments)  ?  Throat discomfort, most likely serotonin effect ?  ? Avelox [Moxifloxacin Hcl In Nacl] Hives  ? Morphine Nausea And Vomiting  ? Septra [Bactrim] Hives  ? Sulfamethoxazole   ? Sumatriptan Other (See Comments)  ? Trimethoprim   ? ?ROS: no fever, chills, URI symptoms, cough.  No GI complaints. ?Jaw pain per HPI. ?Chronic neck pain ? ? ?PHYSICAL EXAM: ? ?BP 114/80   Pulse 72   Temp 98.8 ?F (37.1 ?C) (Tympanic)   Ht '5\' 4"'$  (1.626 m)   Wt 164 lb (74.4 kg)   BMI 28.15 kg/m?  ? ?Well-appearing, pleasant female, in no acute distress ?HEENT: conjunctiva and sclera are clear, EOMI. ?Mod nasal mucosal edema, R>L, no erythema or purulence. No sinus tenderness. ?TM's and EAC's norma. ?Mildly tender at R TMJ and over masseter muscle with clenching.  +clicking of TMJ's bilaterally. ?Mild tenderness along R lower>upper gums. Teeth aren't tender to palpation ?Neck: no lymphadenopathy, thyromegaly or mass ?Heart: regular rate and rhythm ?Lungs: clear bilaterally ?Psych: normal mood, affect hygiene and grooming ?Neuro: alert and oriented, cranial nerves grossly intact ? ? ?ASSESSMENT/PLAN: ? ?Facial pain - R jaw/cheek/gum. On PenVK, told nl per dentist. Component of TMJ, allergies, no sinus infxn. Cont ibuprofen, trial '5mg'$  flexeril prn when severe. Endodont as sch ? ?Continue with the Pen VK antibiotic for any potential tooth-related infection. ?There is likely a component of TMJ and jaw muscle pain (masseter). ?Continue ibuprofen three times daily with food. ?You may try a dose of '5mg'$  flexeril if needed for a pain flare (you can take 1 up to three times daily as max dose). ?You can try heat vs ice (whichever works best). ?See the endodontist as planned, as I agree that there seems to be a component related to your tooth. ? ? ?Pt is seen here for acute illnesses. ?PCP is Clarene Reamer, hasn't seen since 07/2020. ?Needs to f/u with  her PCP. ? ? ?ADDENDUM:  Beverlee Nims (sister) notified me that endodontist dx'd trigeminal neuralgia ? ?

## 2021-12-06 NOTE — Patient Instructions (Signed)
Continue with the Pen VK antibiotic for any potential tooth-related infection. ?There is likely a component of TMJ and jaw muscle pain (masseter). ?Continue ibuprofen three times daily with food. ?You may try a dose of '5mg'$  flexeril if needed for a pain flare (you can take 1 up to three times daily as max dose). ?You can try heat vs ice (whichever works best). ?See the endodontist as planned, as I agree that there seems to be a component related to your tooth. ?

## 2021-12-06 NOTE — Telephone Encounter (Signed)
Transition Care Management Unsuccessful Follow-up Telephone Call ? ?Date of discharge and from where:  12/05/2021-[facility]  ? ?Attempts:  1st Attempt ? ?Reason for unsuccessful TCM follow-up call:  Unable to reach patient ? ?  ?

## 2021-12-07 ENCOUNTER — Encounter: Payer: Self-pay | Admitting: Family Medicine

## 2021-12-07 NOTE — Telephone Encounter (Signed)
Transition Care Management Unsuccessful Follow-up Telephone Call ? ?Date of discharge and from where:  12/05/2021-[name]  ? ?Attempts:  2nd Attempt ? ?Reason for unsuccessful TCM follow-up call:  Unable to reach patient ? ?  ?

## 2021-12-08 NOTE — Telephone Encounter (Signed)
Transition Care Management Unsuccessful Follow-up Telephone Call ? ?Date of discharge and from where:  12/05/2021-[facility]  ? ?Attempts:  3rd Attempt ? ?Reason for unsuccessful TCM follow-up call:  Unable to reach patient ? ?  ?

## 2021-12-15 ENCOUNTER — Other Ambulatory Visit (HOSPITAL_COMMUNITY): Payer: Self-pay

## 2021-12-15 DIAGNOSIS — M5412 Radiculopathy, cervical region: Secondary | ICD-10-CM | POA: Diagnosis not present

## 2021-12-15 DIAGNOSIS — Q761 Klippel-Feil syndrome: Secondary | ICD-10-CM | POA: Diagnosis not present

## 2021-12-15 DIAGNOSIS — F1120 Opioid dependence, uncomplicated: Secondary | ICD-10-CM | POA: Diagnosis not present

## 2021-12-15 MED ORDER — OXYCODONE-ACETAMINOPHEN 5-325 MG PO TABS
1.0000 | ORAL_TABLET | ORAL | 0 refills | Status: DC
  Filled 2021-12-15: qty 100, 30d supply, fill #0

## 2021-12-15 MED ORDER — OXYCODONE-ACETAMINOPHEN 5-325 MG PO TABS
1.0000 | ORAL_TABLET | Freq: Three times a day (TID) | ORAL | 0 refills | Status: DC | PRN
  Filled 2022-01-13: qty 90, 30d supply, fill #0

## 2021-12-15 MED ORDER — OXYCODONE-ACETAMINOPHEN 5-325 MG PO TABS
1.0000 | ORAL_TABLET | Freq: Three times a day (TID) | ORAL | 0 refills | Status: DC | PRN
  Filled 2021-12-15: qty 90, 30d supply, fill #0
  Filled 2022-02-11: qty 85, 29d supply, fill #0
  Filled 2022-02-11: qty 5, 1d supply, fill #0
  Filled ????-??-?? (×2): fill #0

## 2021-12-15 MED ORDER — CYCLOBENZAPRINE HCL 5 MG PO TABS
5.0000 mg | ORAL_TABLET | Freq: Three times a day (TID) | ORAL | 2 refills | Status: DC | PRN
Start: 2021-12-15 — End: 2022-03-24
  Filled 2021-12-15: qty 60, 20d supply, fill #0
  Filled 2022-01-13: qty 60, 20d supply, fill #1
  Filled 2022-03-16: qty 60, 20d supply, fill #2

## 2021-12-16 NOTE — Progress Notes (Signed)
Advised patient of same. ?

## 2021-12-19 ENCOUNTER — Telehealth: Payer: 59 | Admitting: Nurse Practitioner

## 2021-12-19 DIAGNOSIS — N3000 Acute cystitis without hematuria: Secondary | ICD-10-CM | POA: Diagnosis not present

## 2021-12-20 ENCOUNTER — Other Ambulatory Visit (HOSPITAL_COMMUNITY): Payer: Self-pay

## 2021-12-20 ENCOUNTER — Ambulatory Visit (INDEPENDENT_AMBULATORY_CARE_PROVIDER_SITE_OTHER): Payer: 59 | Admitting: Medical

## 2021-12-20 ENCOUNTER — Other Ambulatory Visit: Payer: Self-pay | Admitting: Nurse Practitioner

## 2021-12-20 VITALS — BP 122/72 | HR 84 | Temp 98.6°F | Wt 161.0 lb

## 2021-12-20 DIAGNOSIS — N3001 Acute cystitis with hematuria: Secondary | ICD-10-CM | POA: Diagnosis not present

## 2021-12-20 DIAGNOSIS — R319 Hematuria, unspecified: Secondary | ICD-10-CM | POA: Diagnosis not present

## 2021-12-20 DIAGNOSIS — N3000 Acute cystitis without hematuria: Secondary | ICD-10-CM

## 2021-12-20 DIAGNOSIS — Z87442 Personal history of urinary calculi: Secondary | ICD-10-CM

## 2021-12-20 LAB — POCT URINALYSIS DIP (PROADVANTAGE DEVICE)
Glucose, UA: NEGATIVE mg/dL
Nitrite, UA: POSITIVE — AB
Protein Ur, POC: NEGATIVE mg/dL
Specific Gravity, Urine: 1.03
Urobilinogen, Ur: NEGATIVE
pH, UA: 6.5 (ref 5.0–8.0)

## 2021-12-20 MED ORDER — NITROFURANTOIN MONOHYD MACRO 100 MG PO CAPS
100.0000 mg | ORAL_CAPSULE | Freq: Two times a day (BID) | ORAL | 0 refills | Status: DC
  Filled 2021-12-20: qty 10, 5d supply, fill #0

## 2021-12-20 MED ORDER — NITROFURANTOIN MONOHYD MACRO 100 MG PO CAPS
100.0000 mg | ORAL_CAPSULE | Freq: Two times a day (BID) | ORAL | 0 refills | Status: DC
  Filled 2021-12-20: qty 14, 7d supply, fill #0

## 2021-12-20 NOTE — Progress Notes (Signed)
Subjective: ? Kelly [name] is a 56 y.o. female who presents for ?Chief Complaint  ?Patient presents with  ? possible UTI  ?  Possible UTI, blood in urine 2 days ago. 2 uti in the pass month  ?  ?Here for concern for UTI.   Symptoms x 2 days, urinary frequency, urgency, visible hematuria, came on sudden.   This is the second UTI within 2 months.   No significant abdominal pain but has lower belly pressure.  No back pain.  No fever, no nausea, vomiting or diarrhea. No other aggravating or relieving factors.   ? ?No other c/o. ? ?The following portions of the patient's history were reviewed and updated as appropriate: allergies, current medications, past family history, past medical history, past social history, past surgical history and problem list. ? ?ROS ?Otherwise as in subjective above ? ? ? ?Objective: ?BP 122/72   Pulse 84   Temp 98.6 ?F (37 ?C)   Wt 161 lb (73 kg)   BMI 27.64 kg/m?  ? ?General appearance: alert, no distress, well developed, well nourished ? ? ? ?Assessment: ?Encounter Diagnoses  ?Name Primary?  ? Hematuria, unspecified type Yes  ? Acute cystitis with hematuria   ? History of renal stone   ? ? ? ?Plan: ?Begin Macrobid which was called in, increase water intake.  Culture sent.  Referral back to urology given urinary frequency in general, hx/o stones and 2 back to back UTI ? ? ?Kelly [name] was seen today for possible uti. ? ?Diagnoses and all orders for this visit: ? ?Hematuria, unspecified type ?-     POCT Urinalysis DIP (Proadvantage Device) ?-     Urine Culture ?-     Ambulatory referral to Urology ? ?Acute cystitis with hematuria ?-     Urine Culture ?-     Ambulatory referral to Urology ? ?History of renal stone ? ? ?Follow up: pending culture ?

## 2021-12-20 NOTE — Progress Notes (Signed)
E-Visit for Urinary Problems  We are sorry that you are not feeling well.  Here is how we plan to help!  Based on what you shared with me it looks like you most likely have a simple urinary tract infection.  A UTI (Urinary Tract Infection) is a bacterial infection of the bladder.  Most cases of urinary tract infections are simple to treat but a key part of your care is to encourage you to drink plenty of fluids and watch your symptoms carefully.  I have prescribed MacroBid 100 mg twice a day for 5 days.  Your symptoms should gradually improve. Call us if the burning in your urine worsens, you develop worsening fever, back pain or pelvic pain or if your symptoms do not resolve after completing the antibiotic.  Urinary tract infections can be prevented by drinking plenty of water to keep your body hydrated.  Also be sure when you wipe, wipe from front to back and don't hold it in!  If possible, empty your bladder every 4 hours.  HOME CARE Drink plenty of fluids Compete the full course of the antibiotics even if the symptoms resolve Remember, when you need to go.go. Holding in your urine can increase the likelihood of getting a UTI! GET HELP RIGHT AWAY IF: You cannot urinate You get a high fever Worsening back pain occurs You see blood in your urine You feel sick to your stomach or throw up You feel like you are going to pass out  MAKE SURE YOU  Understand these instructions. Will watch your condition. Will get help right away if you are not doing well or get worse.   Thank you for choosing an e-visit.  Your e-visit answers were reviewed by a board certified advanced clinical practitioner to complete your personal care plan. Depending upon the condition, your plan could have included both over the counter or prescription medications.  Please review your pharmacy choice. Make sure the pharmacy is open so you can pick up prescription now. If there is a problem, you may contact your  provider through MyChart messaging and have the prescription routed to another pharmacy.  Your safety is important to us. If you have drug allergies check your prescription carefully.   For the next 24 hours you can use MyChart to ask questions about today's visit, request a non-urgent call back, or ask for a work or school excuse. You will get an email in the next two days asking about your experience. I hope that your e-visit has been valuable and will speed your recovery.   I spent approximately 5 minutes reviewing the patient's history, current symptoms and coordinating their plan of care today.   Meds ordered this encounter  Medications   nitrofurantoin, macrocrystal-monohydrate, (MACROBID) 100 MG capsule    Sig: Take 1 capsule (100 mg total) by mouth 2 (two) times daily for 5 days.    Dispense:  10 capsule    Refill:  0    

## 2021-12-21 LAB — URINE CULTURE: Organism ID, Bacteria: NO GROWTH

## 2021-12-28 ENCOUNTER — Ambulatory Visit (INDEPENDENT_AMBULATORY_CARE_PROVIDER_SITE_OTHER): Payer: 59 | Admitting: Neurology

## 2021-12-28 ENCOUNTER — Encounter: Payer: Self-pay | Admitting: Neurology

## 2021-12-28 VITALS — BP 104/76 | HR 90 | Ht 64.0 in | Wt 166.2 lb

## 2021-12-28 DIAGNOSIS — Z789 Other specified health status: Secondary | ICD-10-CM

## 2021-12-28 DIAGNOSIS — R634 Abnormal weight loss: Secondary | ICD-10-CM

## 2021-12-28 DIAGNOSIS — G4733 Obstructive sleep apnea (adult) (pediatric): Secondary | ICD-10-CM

## 2021-12-28 NOTE — Progress Notes (Signed)
Subjective:  ?  ?Patient ID: Kelly [name] is a 56 y.o. female. ? ?HPI ? ? ? ?Star Age, MD, PhD ?[facility] ?[place], [address] ?[address] ?[place],  [address] ? ?Ms. Kelly [name] is a 56 year old right-handed woman with an underlying medical history of anemia, anticardiolipin antibody positive state, degenerative neck disease with status post ACDF of C4-6 in December 2020, kidney stones, hyperlipidemia, hypertension, migraine headaches treated with Botox injections, history of TMJ, and overweight state, who presents for evaluation of her sleep apnea.  She is referred by Debbora Presto, NP.  I had evaluated her in 2017 for concern for sleep apnea at the request of Dr. Leta Baptist.  She had a baseline sleep study in December 2017 which showed overall mild sleep apnea but more severe findings during REM sleep.  She had a subsequent titration study in February 2018 and started CPAP therapy in 2018.  She reports difficulty tolerating her CPAP.  She reports that the mask is uncomfortable and she cannot tolerate the mask that covers her face, she has been able to tolerate the nasal pillows but they dislodged and leak.  She is not consistent with usage and would like to explore her options.  She has been able to lose weight, compared to 2018, she has lost nearly 20 pounds.  She tries to hydrate well with water and limits her caffeine intake to 1 or 2 regular sodas per day.  She quit smoking cigarettes about a year ago, she does vape nicotine daily.  She works for [employer] as a Hospital doctor.  She had heard about inspire, she would like to know more about it.  We discussed the inspire procedure and the process postprocedure in detail today.  We also talked about other treatment options for sleep apnea.  I reviewed her CPAP compliance data for the past 90 days from 09/28/2021 through 12/26/2021, during which time she used her machine 44 days with percent use days greater than 4 hours at 42%,  indicating suboptimal compliance, average usage of 5 hours and 28 minutes, average AHI at goal at 0.9/h, leak on the higher side with the 95th percentile at 26.6 L/min on a pressure of 9 cm with EPR of 3.  ? ? ?Previously:  ? ?07/28/16: 56 year old right-handed woman with an underlying medical history of positive anticardiolipin antibody state, hyperlipidemia, hypertension, kidney stones, migraine headaches and obesity, who reports snoring and excessive daytime somnolence. I reviewed your office note from 07/05/2016. She reports worsening headaches for the past 6 weeks. Per family, her snoring has become louder recently as well. She goes to sleep fairly easily but has trouble maintaining sleep and has often woken up before her alarm in the morning. ?She used to have infrequent migraine headaches. She used to have visual auras. Her headache currently is typically in the back of her head or in the right frontal and retro-orbital orbital areas. 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 She has had no recent change in stress. She is a single mother of 3 and also has a foster son. She lives at home with her 3 biological children and one foster son. Her 24 year old daughter sleeps in her bed with her typically. Bedtime is around midnight and wake up time between 6:30 and 7. Her Epworth sleepiness score is 8 out of 24, heart fatigue score is 48 out of 63. She smokes half a pack per day. She does not drink alcohol or use illicit drugs,  she drinks caffeine in the form of sweet tea, 2 glasses per day but drinks a lot of water she states. She has gone to bed with headaches but does not typically wake up with severe headaches. She has a sister with obstructive sleep apnea. She denies significant restless leg symptoms or leg twitching at night. She denies parasomnias. She has gained weight in the last year in the realm of 10 pounds. ?Recent workup includes a brain MRI without contrast on 07/06/2016 which was reported as  normal. She also had a carotid ultrasound, results pending. She has had increased blood pressure values and was started on blood pressure medication less than 6 months ago. ? ?Her Past Medical History Is Significant For: ?Past Medical History:  ?Diagnosis Date  ? 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 Anemia   ? Anticardiolipin antibody positive   ? Cervicalgia   ? followed by dr Sherley Bounds  ? Depression   ? History of herpes genitalis   ? many yrs ago  ? History of kidney stones   ? Hyperlipidemia   ? Hypertension   ? followed by pcp   (11-25-2019  per pt had stress test approx. 2018 w/ Dr Wynonia Lawman, told normal)  ? Migraines   ? neurologist--- dr Jaynee Eagles  ? OSA on CPAP   ? TMJ (dislocation of temporomandibular joint)   ? 11-25-2019  jaw pops, no appliance  ? Wears glasses   ? ? ?Her Past Surgical History Is Significant For: ?Past Surgical History:  ?Procedure Laterality Date  ? ANTERIOR CERVICAL DECOMP/DISCECTOMY FUSION  12/ 2020   [facility]  ? C4 - 6  ? CESAREAN SECTION    ? X3   LAST ONE 11-30-2004  ? CYSTOSCOPY W/ RETROGRADES Right 10/29/2012  ? Procedure: CYSTOSCOPY WITH RETROGRADE PYELOGRAM and stent placement;  Surgeon: Reece Packer, MD;  Location: [facility];  Service: Urology;  Laterality: Right;  rt stent placement , rt retrograde and cysto   ? CYSTOSCOPY W/ URETERAL STENT PLACEMENT Right 11/23/2012  ? Procedure: CYSTOSCOPY WITH STENT REPLACEMENT;  Surgeon: Alexis Frock, MD;  Location: [facility];  Service: Urology;  Laterality: Right;  ? CYSTOSCOPY/RETROGRADE/URETEROSCOPY/STONE EXTRACTION WITH BASKET Right 11/23/2012  ? Procedure: CYSTOSCOPY/RETROGRADE/URETEROSCOPY/STONE EXTRACTION WITH BASKET;  Surgeon: Alexis Frock, MD;  Location[facility];  Service: Urology;  Laterality: Right;  ? EVALUATION UNDER ANESTHESIA WITH ANAL FISSUROTOMY N/A 01/23/2020  ? Procedure: ANAL EXAM UNDER ANESTHESIA, FISTULOTOMY, SKIN TAG EXCISION;  Surgeon:  Leighton Ruff, MD;  Location: [facility];  Service: General;  Laterality: N/A;  ? INTRAUTERINE DEVICE (IUD) INSERTION    ? mirena inserted 07-11-18  ? ? ?Her Family History Is Significant For: ?Family History  ?Problem Relation Age of Onset  ? Heart disease Mother 48  ?     died suddenly of MI  ? Heart attack Mother   ? Diabetes Father   ? Hypertension Father   ? Cancer Father   ?     colon cancer  ? Kidney Stones Father   ? Diabetes Sister   ? Heart disease Maternal Uncle 16  ?     died of MI  ? Heart disease Maternal Uncle   ? Breast cancer Maternal Grandmother   ? Sleep apnea Neg Hx   ? ? ?Her Social History Is Significant For: ?Social History  ? ?Socioeconomic History  ? Marital status: Single  ?  Spouse name: Not on file  ? Number of  children: 3  ? Years of education: 39  ? Highest education level: Not on file  ?Occupational History  ? Occupation: patient advocate   ?  Comment: [employer], pt advocate  ?Tobacco Use  ? Smoking status: Every Day  ?  Types: E-cigarettes  ? Smokeless tobacco: Never  ? Tobacco comments:  ?  vape  ?Vaping Use  ? Vaping Use: Never used  ?Substance and Sexual Activity  ? Alcohol use: Yes  ?  Comment: occ  ? Drug use: Never  ? Sexual activity: Yes  ?  Partners: Male  ?  Birth control/protection: I.U.D.  ?  Comment: 1ST intercourse- 18, partners- 7 current partner- 1 year   ?Other Topics Concern  ? Not on file  ?Social History Narrative  ? Lives at home w/ her children  ? Right-handed  ? Caffeine: 1 soda per day  ? ?Social Determinants of Health  ? ?Financial Resource Strain: Not on file  ?Food Insecurity: Not on file  ?Transportation Needs: Not on file  ?Physical Activity: Not on file  ?Stress: Not on file  ?Social Connections: Not on file  ? ? ?Her Allergies Are:  ?Allergies  ?Allergen Reactions  ? Imitrex [Sumatriptan Base] Other (See Comments)  ?  Esophageal discomfort most likely serotonin effect  ? Zomig Other (See Comments)  ?  Throat discomfort, most likely serotonin effect ?  ?  Avelox [Moxifloxacin Hcl In Nacl] Hives  ? Morphine Nausea And Vomiting  ? Septra [Bactrim] Hives  ? Sulfamethoxazole   ? Sumatriptan Other (See Comments)  ? Trimethoprim   ?:  ? ?Her Current Medications Are

## 2021-12-28 NOTE — Patient Instructions (Signed)
It was nice to see you again today!  ? ?Here is what we discussed today:   ?  ?Based on your symptoms and your exam I believe you may still be at risk for obstructive sleep apnea and would benefit from re-evaluation as it has been many years and you have lost weight. Therefore, I think we should proceed with a sleep study to determine how severe your sleep apnea is. As discussed, you may be a candidate for Inspire or an oral appliance, as you have not been able to consistently tolerate treatment with CPAP.  ? ?Please remember, the risks and ramifications of moderate to severe obstructive sleep apnea or OSA are: Cardiovascular disease, including congestive heart failure, stroke, difficult to control hypertension, arrhythmias, and even type 2 diabetes has been linked to untreated OSA. Sleep apnea causes disruption of sleep and sleep deprivation in most cases, which, in turn, can cause recurrent headaches, problems with memory, mood, concentration, focus, and vigilance. Most people with untreated sleep apnea report excessive daytime sleepiness, which can affect their ability to drive. Please do not drive if you feel sleepy.  ? ?We will keep you posted about your sleep test results and where to go from there. We will follow up in sleep clinic accordingly.  ? ?Our sleep lab administrative assistant will call you to schedule your sleep study. If you don't hear back from her by about 2 weeks from now, please feel free to call her at [phone]. You can leave a message with your phone number and concerns, if you get the voicemail box. She will call back as soon as possible.  ? ? ?

## 2021-12-30 ENCOUNTER — Telehealth: Payer: Self-pay

## 2021-12-30 NOTE — Telephone Encounter (Signed)
LVM for pt to call me back to schedule sleep study  

## 2022-01-03 DIAGNOSIS — R310 Gross hematuria: Secondary | ICD-10-CM | POA: Diagnosis not present

## 2022-01-03 DIAGNOSIS — N3021 Other chronic cystitis with hematuria: Secondary | ICD-10-CM | POA: Diagnosis not present

## 2022-01-04 ENCOUNTER — Telehealth: Payer: Self-pay

## 2022-01-04 NOTE — Telephone Encounter (Signed)
LVM for pt to call me back to schedule sleep study  

## 2022-01-05 ENCOUNTER — Ambulatory Visit: Payer: 59 | Admitting: Family Medicine

## 2022-01-06 ENCOUNTER — Encounter: Payer: Self-pay | Admitting: Family Medicine

## 2022-01-10 DIAGNOSIS — R310 Gross hematuria: Secondary | ICD-10-CM | POA: Diagnosis not present

## 2022-01-10 DIAGNOSIS — R319 Hematuria, unspecified: Secondary | ICD-10-CM | POA: Diagnosis not present

## 2022-01-13 ENCOUNTER — Other Ambulatory Visit (HOSPITAL_COMMUNITY): Payer: Self-pay

## 2022-01-13 ENCOUNTER — Other Ambulatory Visit: Payer: Self-pay | Admitting: Cardiovascular Disease

## 2022-01-13 MED ORDER — METOPROLOL TARTRATE 50 MG PO TABS
50.0000 mg | ORAL_TABLET | Freq: Two times a day (BID) | ORAL | 3 refills | Status: DC
  Filled 2022-01-13: qty 60, 30d supply, fill #0
  Filled 2022-02-10: qty 60, 30d supply, fill #1
  Filled 2022-03-16: qty 60, 30d supply, fill #2

## 2022-01-14 ENCOUNTER — Other Ambulatory Visit (HOSPITAL_COMMUNITY): Payer: Self-pay

## 2022-01-18 ENCOUNTER — Other Ambulatory Visit (HOSPITAL_COMMUNITY): Payer: Self-pay

## 2022-01-18 DIAGNOSIS — K580 Irritable bowel syndrome with diarrhea: Secondary | ICD-10-CM | POA: Diagnosis not present

## 2022-01-18 DIAGNOSIS — R140 Abdominal distension (gaseous): Secondary | ICD-10-CM | POA: Diagnosis not present

## 2022-01-18 DIAGNOSIS — K625 Hemorrhage of anus and rectum: Secondary | ICD-10-CM | POA: Diagnosis not present

## 2022-01-18 DIAGNOSIS — R151 Fecal smearing: Secondary | ICD-10-CM | POA: Diagnosis not present

## 2022-01-18 MED ORDER — XIFAXAN 550 MG PO TABS
550.0000 mg | ORAL_TABLET | Freq: Three times a day (TID) | ORAL | 2 refills | Status: DC
  Filled 2022-01-18 – 2022-03-15 (×4): qty 42, 14d supply, fill #0
  Filled 2022-05-12: qty 42, 14d supply, fill #1

## 2022-01-19 ENCOUNTER — Other Ambulatory Visit (HOSPITAL_COMMUNITY): Payer: Self-pay

## 2022-01-19 NOTE — Telephone Encounter (Signed)
HST- [facility] no auth req  patient is scheduled to pick up at [facility] on 01/26/22 at 2 pm

## 2022-01-21 ENCOUNTER — Telehealth: Payer: Self-pay

## 2022-01-21 ENCOUNTER — Other Ambulatory Visit (HOSPITAL_COMMUNITY): Payer: Self-pay

## 2022-01-21 DIAGNOSIS — N83201 Unspecified ovarian cyst, right side: Secondary | ICD-10-CM

## 2022-01-21 NOTE — Telephone Encounter (Signed)
Pt Kelly [name] reporting that she saw her GI doc. Dr. Collene Mares and was told that there were some ovarian cysts seen on her CT scan and that it may be recommended that she have an ultrasound performed. Pt reports Dr. Collene Mares plans on sending [place] the report but the pt also plans on faxing over report. Please advise once reviewed. Thanks.

## 2022-01-24 ENCOUNTER — Encounter: Payer: Self-pay | Admitting: Obstetrics & Gynecology

## 2022-01-24 NOTE — Telephone Encounter (Signed)
FYI. Scheduled for 02/24/22.

## 2022-01-24 NOTE — Telephone Encounter (Signed)
"  Schedule patient for a visit with me with a Pelvic US"  Pt notified and voiced understanding. Will send msg to scheduling.

## 2022-01-25 DIAGNOSIS — R310 Gross hematuria: Secondary | ICD-10-CM | POA: Diagnosis not present

## 2022-01-26 ENCOUNTER — Ambulatory Visit: Payer: 59 | Admitting: Neurology

## 2022-01-26 ENCOUNTER — Other Ambulatory Visit (HOSPITAL_COMMUNITY): Payer: Self-pay

## 2022-01-26 DIAGNOSIS — Z789 Other specified health status: Secondary | ICD-10-CM

## 2022-01-26 DIAGNOSIS — R634 Abnormal weight loss: Secondary | ICD-10-CM

## 2022-01-26 DIAGNOSIS — G4733 Obstructive sleep apnea (adult) (pediatric): Secondary | ICD-10-CM

## 2022-01-26 MED ORDER — CEPHALEXIN 500 MG PO CAPS
500.0000 mg | ORAL_CAPSULE | Freq: Two times a day (BID) | ORAL | 0 refills | Status: DC
  Filled 2022-01-26: qty 10, 5d supply, fill #0

## 2022-01-27 ENCOUNTER — Other Ambulatory Visit (HOSPITAL_COMMUNITY): Payer: Self-pay

## 2022-01-27 MED ORDER — AMOXICILLIN-POT CLAVULANATE 500-125 MG PO TABS
1.0000 | ORAL_TABLET | Freq: Two times a day (BID) | ORAL | 0 refills | Status: DC
  Filled 2022-01-27: qty 14, 7d supply, fill #0

## 2022-01-28 ENCOUNTER — Other Ambulatory Visit (HOSPITAL_COMMUNITY): Payer: Self-pay

## 2022-01-28 MED ORDER — AMITRIPTYLINE HCL 50 MG PO TABS
50.0000 mg | ORAL_TABLET | Freq: Every day | ORAL | 0 refills | Status: DC
  Filled 2022-01-28: qty 90, 90d supply, fill #0

## 2022-02-02 NOTE — Progress Notes (Signed)
See procedure note.

## 2022-02-03 NOTE — Procedures (Signed)
   [facility]  HOME SLEEP TEST (Watch PAT) REPORT  STUDY DATE: 01/26/2022  DOB: 1966-05-01  MRN: [id number]  ORDERING CLINICIAN: Star Age, MD, PhD   REFERRING CLINICIAN: Debbora Presto, NP  CLINICAL INFORMATION/HISTORY: 56 year old right-handed woman with an underlying medical history of anemia, anticardiolipin antibody positive state, degenerative neck disease with status post ACDF of C4-6 in December 2020, kidney stones, hyperlipidemia, hypertension, migraine headaches treated with Botox injections, history of TMJ, and overweight state, who presents for evaluation of her sleep apnea.  She has had trouble tolerating her CPAP.  Epworth sleepiness score: 8/24.  BMI: 28.2 kg/m  FINDINGS:   Sleep Summary:   Total Recording Time (hours, min): 8 hours, 1 minute  Total Sleep Time (hours, min):  7 hours, 9 minutes   Percent REM (%):    36.9%   Respiratory Indices:   Calculated pAHI (per hour):  10.5/hour         REM pAHI:    25.7/hour       NREM pAHI: 1.8/hour  Oxygen Saturation Statistics:    Oxygen Saturation (%) Mean: 95%   Minimum oxygen saturation (%):                 78%   O2 Saturation Range (%): 78-100%    O2 Saturation (minutes) <=88%: 1.8 min  Pulse Rate Statistics:   Pulse Mean (bpm):    72/min    Pulse Range (60-91/min)   IMPRESSION: OSA (obstructive sleep apnea)   RECOMMENDATION:  This home sleep test demonstrates overall mild obstructive sleep apnea with a total AHI of 10.5/hour, but O2 nadir of 78%.  Mild to moderate snoring was detected, at times in the louder range. Given the patient's medical history and sleep related complaints, ongoing treatment with positive airway pressure is recommended.  The patient may be eligible for a new set of equipment, I will write for an AutoPap machine if she would like to continue with positive airway pressure treatment. Alternative treatments may include the use of an oral appliance in appropriate  candidates.   Please note that untreated obstructive sleep apnea may carry additional perioperative morbidity. Patients with significant obstructive sleep apnea should receive perioperative PAP therapy and the surgeons and particularly the anesthesiologist should be informed of the diagnosis and the severity of the sleep disordered breathing. The patient should be cautioned not to drive, work at heights, or operate dangerous or heavy equipment when tired or sleepy. Review and reiteration of good sleep hygiene measures should be pursued with any patient. Other causes of the patient's symptoms, including circadian rhythm disturbances, an underlying mood disorder, medication effect and/or an underlying medical problem cannot be ruled out based on this test. Clinical correlation is recommended.   The patient and his referring provider will be notified of the test results. The patient will be seen in follow up in sleep clinic at [facility].  I certify that I have reviewed the raw data recording prior to the issuance of this report in accordance with the standards of the American Academy of Sleep Medicine (AASM).  INTERPRETING PHYSICIAN:   Star Age, MD, PhD  Board Certified in Neurology and Sleep Medicine  [facility] [address] [place], [place] [address] [phone]

## 2022-02-07 ENCOUNTER — Telehealth: Payer: Self-pay | Admitting: *Deleted

## 2022-02-07 NOTE — Telephone Encounter (Signed)
-----   Message from Star Age, MD sent at 02/03/2022  5:19 PM EDT ----- Patient referred by AL for reevaluation of her OSA.  She has been on CPAP but has difficulty tolerating it.  I saw her on 12/28/2021 and she had a home sleep test on 01/26/2022.  Please advise patient that she has mild sleep apnea.  Treatment options would include AutoPap therapy and she may be eligible for a new machine.  If she would like to explore dental treatment with an oral appliance through dentistry, we can make a referral to a dentist.  She did have an AHI 10.5/h and oxygen level dropped to 78%, given her oxygen desaturations into the mid to lower 80s and 1 time up to higher 70s, I would recommend ongoing treatment of her sleep apnea.  Please let me know if she would like a referral to dentistry or an order for a new AutoPap machine.  Star Age, MD, PhD [facility])

## 2022-02-07 NOTE — Telephone Encounter (Signed)
LVM for patient to call back to go over sleep study results

## 2022-02-08 ENCOUNTER — Telehealth: Payer: Self-pay | Admitting: Family Medicine

## 2022-02-08 ENCOUNTER — Other Ambulatory Visit (HOSPITAL_COMMUNITY): Payer: Self-pay

## 2022-02-08 NOTE — Telephone Encounter (Signed)
Sent pt a mychart message to get Botox appt for Thursday rescheduled.

## 2022-02-09 ENCOUNTER — Other Ambulatory Visit (HOSPITAL_COMMUNITY): Payer: Self-pay

## 2022-02-10 ENCOUNTER — Other Ambulatory Visit (HOSPITAL_COMMUNITY): Payer: Self-pay

## 2022-02-10 ENCOUNTER — Ambulatory Visit: Payer: 59 | Admitting: Family Medicine

## 2022-02-11 ENCOUNTER — Other Ambulatory Visit (HOSPITAL_COMMUNITY): Payer: Self-pay

## 2022-02-14 ENCOUNTER — Telehealth: Payer: Self-pay | Admitting: *Deleted

## 2022-02-14 DIAGNOSIS — G4733 Obstructive sleep apnea (adult) (pediatric): Secondary | ICD-10-CM

## 2022-02-15 NOTE — Telephone Encounter (Signed)
New AutoPap machine ordered.  She should be eligible for new equipment, please send to existing DME.  Referral to Dr. Myrtis Ser placed as well.

## 2022-02-16 ENCOUNTER — Telehealth: Payer: Self-pay | Admitting: Neurology

## 2022-02-16 ENCOUNTER — Other Ambulatory Visit (HOSPITAL_COMMUNITY): Payer: Self-pay

## 2022-02-16 NOTE — Telephone Encounter (Signed)
Referral for Dentistry sent to Dr. Oneal Grout [phone].

## 2022-02-17 ENCOUNTER — Ambulatory Visit (INDEPENDENT_AMBULATORY_CARE_PROVIDER_SITE_OTHER): Payer: 59 | Admitting: Neurology

## 2022-02-17 DIAGNOSIS — G43709 Chronic migraine without aura, not intractable, without status migrainosus: Secondary | ICD-10-CM

## 2022-02-17 NOTE — Progress Notes (Signed)
Botox- 200 units x 1 vial Lot: [id number] Expiration: 09/2024 NDC: 9147-8295-62  Bacteriostatic 0.9% Sodium Chloride- 45m total Lot: [id number]: 03/23/2023 NDC: [address]-52 Dx: [id number]/B

## 2022-02-17 NOTE — Progress Notes (Signed)
Consent Form Botulism Toxin Injection For Chronic Migraine  02/17/2022: >>70% improvement in migraine frequency.  Reviewed orally with patient, additionally signature is on file:  Botulism toxin has been approved by the Federal drug administration for treatment of chronic migraine. Botulism toxin does not cure chronic migraine and it may not be effective in some patients.  The administration of botulism toxin is accomplished by injecting a small amount of toxin into the muscles of the neck and head. Dosage must be titrated for each individual. Any benefits resulting from botulism toxin tend to wear off after 3 months with a repeat injection required if benefit is to be maintained. Injections are usually done every 3-4 months with maximum effect peak achieved by about 2 or 3 weeks. Botulism toxin is expensive and you should be sure of what costs you will incur resulting from the injection.  The side effects of botulism toxin use for chronic migraine may include:   -Transient, and usually mild, facial weakness with facial injections  -Transient, and usually mild, head or neck weakness with head/neck injections  -Reduction or loss of forehead facial animation due to forehead muscle weakness  -Eyelid drooping  -Dry eye  -Pain at the site of injection or bruising at the site of injection  -Double vision  -Potential unknown long term risks  Contraindications: You should not have Botox if you are pregnant, nursing, allergic to albumin, have an infection, skin condition, or muscle weakness at the site of the injection, or have myasthenia gravis, Lambert-Eaton syndrome, or ALS.  It is also possible that as with any injection, there may be an allergic reaction or no effect from the medication. Reduced effectiveness after repeated injections is sometimes seen and rarely infection at the injection site may occur. All care will be taken to prevent these side effects. If therapy is given over a long time,  atrophy and wasting in the muscle injected may occur. Occasionally the patient's become refractory to treatment because they develop antibodies to the toxin. In this event, therapy needs to be modified.  I have read the above information and consent to the administration of botulism toxin.    BOTOX PROCEDURE NOTE FOR MIGRAINE HEADACHE    Contraindications and precautions discussed with patient(above). Aseptic procedure was observed and patient tolerated procedure. Procedure performed by Dr. Georgia Dom  The condition has existed for more than 6 months, and pt does not have a diagnosis of ALS, Myasthenia Gravis or Lambert-Eaton Syndrome.  Risks and benefits of injections discussed and pt agrees to proceed with the procedure.  Written consent obtained  These injections are medically necessary. Pt  receives good benefits from these injections. These injections do not cause sedations or hallucinations which the oral therapies may cause.  Description of procedure:  The patient was placed in a sitting position. The standard protocol was used for Botox as follows, with 5 units of Botox injected at each site:   -Procerus muscle, midline injection  -Corrugator muscle, bilateral injection  -Frontalis muscle, bilateral injection, with 2 sites each side, medial injection was performed in the upper one third of the frontalis muscle, in the region vertical from the medial inferior edge of the superior orbital rim. The lateral injection was again in the upper one third of the forehead vertically above the lateral limbus of the cornea, 1.5 cm lateral to the medial injection site.  -Temporalis muscle injection, 4 sites, bilaterally. The first injection was 3 cm above the tragus of the ear, second injection site  was 1.5 cm to 3 cm up from the first injection site in line with the tragus of the ear. The third injection site was 1.5-3 cm forward between the first 2 injection sites. The fourth injection site  was 1.5 cm posterior to the second injection site.   -Occipitalis muscle injection, 3 sites, bilaterally. The first injection was done one half way between the occipital protuberance and the tip of the mastoid process behind the ear. The second injection site was done lateral and superior to the first, 1 fingerbreadth from the first injection. The third injection site was 1 fingerbreadth superiorly and medially from the first injection site.  -Cervical paraspinal muscle injection, 2 sites, bilateral knee first injection site was 1 cm from the midline of the cervical spine, 3 cm inferior to the lower border of the occipital protuberance. The second injection site was 1.5 cm superiorly and laterally to the first injection site.  -Trapezius muscle injection was performed at 3 sites, bilaterally. The first injection site was in the upper trapezius muscle halfway between the inflection point of the neck, and the acromion. The second injection site was one half way between the acromion and the first injection site. The third injection was done between the first injection site and the inflection point of the neck.   Will return for repeat injection in 3 months.   155 units of Botox was used, 45u Botox not injected was wasted. The patient tolerated the procedure well, there were no complications of the above procedure.

## 2022-02-21 ENCOUNTER — Other Ambulatory Visit (HOSPITAL_COMMUNITY): Payer: Self-pay

## 2022-02-21 ENCOUNTER — Telehealth: Payer: Self-pay | Admitting: Neurology

## 2022-02-21 DIAGNOSIS — R310 Gross hematuria: Secondary | ICD-10-CM | POA: Diagnosis not present

## 2022-02-21 DIAGNOSIS — R8271 Bacteriuria: Secondary | ICD-10-CM | POA: Diagnosis not present

## 2022-02-21 MED ORDER — SOLIFENACIN SUCCINATE 5 MG PO TABS
5.0000 mg | ORAL_TABLET | Freq: Every day | ORAL | 11 refills | Status: DC
  Filled 2022-02-21: qty 30, 30d supply, fill #0

## 2022-02-21 NOTE — Telephone Encounter (Signed)
Pt is calling and have questions about the new CPAP machine that was ordered for her, would like a call back from the nurse before she allows the DME company to send the CPAP machine.

## 2022-02-23 ENCOUNTER — Other Ambulatory Visit (HOSPITAL_COMMUNITY): Payer: Self-pay

## 2022-02-23 NOTE — Telephone Encounter (Signed)
I called and spoke to pt and relayed that from what I see she is ordered an autopap machine. Different that is 2 set pressures for self adjusting how she breathes at night vs one continuous pressure.  I sent message to [employer]/ Lovina Reach to clarify/confirm. I would let her know once I hear back from her.

## 2022-02-24 ENCOUNTER — Ambulatory Visit (INDEPENDENT_AMBULATORY_CARE_PROVIDER_SITE_OTHER): Payer: 59

## 2022-02-24 ENCOUNTER — Encounter: Payer: Self-pay | Admitting: Obstetrics & Gynecology

## 2022-02-24 ENCOUNTER — Ambulatory Visit: Payer: 59 | Admitting: Obstetrics & Gynecology

## 2022-02-24 ENCOUNTER — Other Ambulatory Visit (HOSPITAL_COMMUNITY): Payer: Self-pay

## 2022-02-24 VITALS — BP 114/76 | HR 76

## 2022-02-24 DIAGNOSIS — N83201 Unspecified ovarian cyst, right side: Secondary | ICD-10-CM

## 2022-02-24 DIAGNOSIS — M5412 Radiculopathy, cervical region: Secondary | ICD-10-CM | POA: Diagnosis not present

## 2022-02-24 DIAGNOSIS — Z30431 Encounter for routine checking of intrauterine contraceptive device: Secondary | ICD-10-CM | POA: Diagnosis not present

## 2022-02-24 DIAGNOSIS — Z6830 Body mass index (BMI) 30.0-30.9, adult: Secondary | ICD-10-CM

## 2022-02-24 DIAGNOSIS — E6609 Other obesity due to excess calories: Secondary | ICD-10-CM | POA: Diagnosis not present

## 2022-02-24 DIAGNOSIS — N83202 Unspecified ovarian cyst, left side: Secondary | ICD-10-CM

## 2022-02-24 DIAGNOSIS — F1120 Opioid dependence, uncomplicated: Secondary | ICD-10-CM | POA: Diagnosis not present

## 2022-02-24 DIAGNOSIS — Q761 Klippel-Feil syndrome: Secondary | ICD-10-CM | POA: Diagnosis not present

## 2022-02-24 MED ORDER — OXYCODONE-ACETAMINOPHEN 5-325 MG PO TABS
1.0000 | ORAL_TABLET | Freq: Three times a day (TID) | ORAL | 0 refills | Status: DC | PRN
  Filled 2022-03-11: qty 90, 30d supply, fill #0

## 2022-02-24 MED ORDER — OXYCODONE-ACETAMINOPHEN 5-325 MG PO TABS: 1.0000 | ORAL_TABLET | Freq: Three times a day (TID) | ORAL | 0 refills | Status: DC | PRN

## 2022-02-24 MED ORDER — CYCLOBENZAPRINE HCL 5 MG PO TABS
5.0000 mg | ORAL_TABLET | Freq: Three times a day (TID) | ORAL | 2 refills | Status: DC | PRN
  Filled 2022-02-24 – 2022-03-16 (×2): qty 60, 20d supply, fill #0
  Filled 2022-05-12: qty 60, 20d supply, fill #1
  Filled 2022-06-14: qty 60, 20d supply, fill #2

## 2022-02-24 MED ORDER — PREGABALIN 100 MG PO CAPS
100.0000 mg | ORAL_CAPSULE | Freq: Two times a day (BID) | ORAL | 1 refills | Status: DC
  Filled 2022-03-16: qty 180, 90d supply, fill #0
  Filled 2022-05-12 – 2022-05-13 (×2): qty 90, 45d supply, fill #0
  Filled 2022-08-09: qty 180, 90d supply, fill #1

## 2022-02-24 MED ORDER — PHENTERMINE HCL 37.5 MG PO TABS
37.5000 mg | ORAL_TABLET | Freq: Every day | ORAL | 2 refills | Status: DC
  Filled 2022-02-24: qty 30, 30d supply, fill #0
  Filled 2022-05-12: qty 30, 30d supply, fill #1
  Filled 2022-07-13: qty 30, 30d supply, fill #2

## 2022-02-24 NOTE — Progress Notes (Signed)
    Kelly [name] 30-Aug-1965 [id number]        56 y.o.  [id number]   RP: Bilateral Ovarian Cysts for Pelvic US  HPI: Bilateral Ovarian Cysts on CT Scan 01/24/22 done by Uro to investigate hematuria.  Well on the Mirena IUD x 06/2018.  Very light occasional menses.  No pelvic pain.   OB History  Gravida Para Term Preterm AB Living  '4 3 3   1 3  '$ SAB IAB Ectopic Multiple Live Births  1       3    # Outcome Date GA Lbr Len/2nd Weight Sex Delivery Anes PTL Lv  4 SAB           3 Term     F CS-Unspec  N LIV  2 Term     F CS-Unspec  N LIV  1 Term     M CS-Unspec  N LIV    Past medical history,surgical history, problem list, medications, allergies, family history and social history were all reviewed and documented in the EPIC chart.   Directed ROS with pertinent positives and negatives documented in the history of present illness/assessment and plan.  Exam:  Vitals:   02/24/22 1047  BP: 114/76  Pulse: 76  SpO2: 96%   General appearance:  Normal  Pelvic US today: T/V images.  Anteverted uterus normal in size and shape with no myometrial mass.  The uterus is measured at 10.15 x 6.96 x 5.08 cm.  Thin and symmetrical endometrial lining measured at 2.27 mm with trace amount of fluid within the cavity and no obvious mass or thickening seen.  The IUD is noted in proper intra uterine position.  Both ovaries are normal in size with sparse follicles.  A simple follicle is measured at 2 cm on the left ovary.  No adnexal mass.  No free fluid in the pelvis.   Assessment/Plan:  56 y.o. [id number]   1. Bilateral ovarian cysts Bilateral Ovarian Cysts on CT Scan 01/24/22 done by Uro to investigate hematuria.  Well on the Mirena IUD x 06/2018.  Very light occasional menses.  No pelvic pain.  Pelvic US findings reviewed.  Patient reassured that bilateral ovaries are normal in size and appearance.  A small 2 cm simple follicle is present on the left ovary.  No adnexal mass and no free fluid in the pelvis.  The  uterus and endometrial lining are normal.  2. Encounter for routine checking of intrauterine contraceptive device (IUD)  Well on Mirena IUD x 06/2018.  IUD in good IU position per [place] today.  3. Class 1 obesity due to excess calories with serious comorbidity and body mass index (BMI) of 30.0 to 30.9 in adult Obesity.  Increase physical activities.  Will help start on a low calorie/carb diet with phentermine 37.5 mg per mouth daily for 3 months.  Patient's blood pressure is normal.  Has done well on phentermine in the past.  Usage reviewed and prescription sent to pharmacy.  Other orders - phentermine (ADIPEX-P) 37.5 MG tablet; Take 1 tablet (37.5 mg total) by mouth daily before breakfast.   [name] MD, 11:06 AM 02/24/2022

## 2022-02-24 NOTE — Telephone Encounter (Signed)
Denyse Amass, RN; [place], Margreta Journey it looks like someone else within [employer] is processing this.. I think it was faxed to them!      Previous Messages    ----- Message -----  From: Brandon Melnick, RN  Sent: 02/23/2022   5:22 PM EDT  To: Vanessa Ralphs; Marchelle Gearing  Subject: FW: new apap user                               There is a new order for autopap machine for her since 02-15-2022, I think she is eligible for new machine.  For this pt,  Lovey Newcomer RN  ----- Message -----  From: Marchelle Gearing  Sent: 02/23/2022   4:49 PM EDT  To: Vanessa Ralphs; Brandon Melnick, RN  Subject: RE: new apap user                               It can do either CPAP or Auto. Right now she is on CPAP with a pressure of 9   ----- Message -----  From: Brandon Melnick, RN  Sent: 02/23/2022   2:28 PM EDT  To: Vanessa Ralphs; Marchelle Gearing  Subject: new apap user                                   Hey could you relay to me the type of machine you have down for this pt.  She though APAP and someone said Cpap.  So she is clarifying to be sure before it is sent out.     Lovey Newcomer RN  her name is   Kelly [name]  Female, 56 y.o., 1966/01/12  Pronouns:  she/her/hers  MRN:  [id number]

## 2022-02-25 ENCOUNTER — Encounter: Payer: Self-pay | Admitting: Obstetrics & Gynecology

## 2022-03-07 ENCOUNTER — Other Ambulatory Visit (HOSPITAL_COMMUNITY): Payer: Self-pay

## 2022-03-10 NOTE — Progress Notes (Addendum)
Office Visit Note  Patient: Kelly [name]             Date of Birth: [birth year]           MRN: [id number]             PCP: Elby Beck, FNP Referring: Carlena Hurl, PA-C Visit Date: 03/24/2022 Occupation: '@GUAROCC'$ @  Subjective:  Raynaud's phenomenon and fatigue  History of Present Illness: Kelly [name] is a 56 y.o. female seen in consultation per request of her PCP.  According to the patient she was diagnosed with positive anticardiolipin antibodies when she was 56 years old and pregnant.  She was on aspirin during the pregnancy.  She states over the years she has noted redness on her chest and her face.  She has also had chronic hair loss.  Over the last 3 years she has noticed increase photosensitivity.  She states 1 time she went to the beach and she had rash all over her body to the point she had to take the steroids.  She has been using sun protection and protective clothing since then.  Last year she started having severe Raynaud's.  She brought pictures on the cell phone with her fingers turning white.  She states initially that she thought the numbness in her hands and tingling was related to her cervical spine's disease.  But gradually her fingers Became white and painful.  She also developed an infection in her right middle finger which took a long time to heal.  She denies any history of digital ulcers.  She has not noticed any discoloration in her feet but usually during the winter months she is wearing shoes.  Her symptoms are better better during the warmer weather.  She also gives history of pain in her bilateral hands.  She has not noticed any joint swelling.  She notices edema in her legs over the end of the day.  She has known history of degenerative disc disease since 2000.  She was also diagnosed with a spinal stenosis.  She underwent C-spine fusion by Dr. Ronnald Ramp in 2020.  She states her neck is better but she still has some discomfort.  She complains of discomfort in  her bilateral trochanteric region.  She gives history of dry mouth, hair loss, fatigue, photosensitivity, joint pain.  There is no history of oral ulcers, malar rash or lymphadenopathy.  Patient thinks that her sister had some form of autoimmune disease.  She is gravida 4, para 3, miscarriage 1.  There is no history of preeclampsia.  There is no history of DVTs.  Activities of Daily Living:  Patient reports morning stiffness for 1 hour.   Patient Reports nocturnal pain.  Difficulty dressing/grooming: Denies Difficulty climbing stairs: Reports Difficulty getting out of chair: Denies Difficulty using hands for taps, buttons, cutlery, and/or writing: Reports  Review of Systems  Constitutional:  Positive for fatigue.  HENT:  Positive for mouth dryness. Negative for mouth sores.   Eyes:  Negative for dryness.  Respiratory:  Negative for shortness of breath.   Cardiovascular:  Positive for chest pain and swelling in legs/feet. Negative for palpitations.       Cardio w/u neg per patient.  Gastrointestinal:  Positive for constipation and diarrhea. Negative for blood in stool.  Endocrine: Positive for increased urination.  Genitourinary:  Negative for painful urination and involuntary urination.  Musculoskeletal:  Positive for joint pain, joint pain, myalgias, morning stiffness, muscle tenderness and myalgias. Negative for joint  swelling and muscle weakness.  Skin:  Positive for color change, hair loss and sensitivity to sunlight. Negative for rash.  Allergic/Immunologic: Negative for susceptible to infections.  Neurological:  Positive for numbness and headaches. Negative for dizziness.  Hematological:  Negative for swollen glands.  Psychiatric/Behavioral:  Negative for depressed mood and sleep disturbance. The patient is not nervous/anxious.     PMFS History:  Patient Active Problem List   Diagnosis Date Noted   Numbness and tingling in both hands 07/25/2020   Overweight 07/25/2020    Positive depression screening 07/25/2020   Stressful life event affecting family 07/12/2019   Caregiver burden 07/12/2019   Tobacco abuse counseling 07/12/2019   Protrusion of cervical intervertebral disc 04/10/2019   Spinal stenosis of cervical region 04/10/2019   Overactive bladder 11/19/2018   Photosensitization due to sun 11/02/2018   Generalized pain 11/02/2018   High risk medication use 11/02/2018   Foot pain, bilateral 05/10/2018   Heel pain, bilateral 05/10/2018   Plantar fasciitis 05/10/2018   Left knee pain 05/10/2018   Patellofemoral pain syndrome of both knees 05/10/2018   Generalized abdominal pain 04/17/2018   Flank pain 04/17/2018   Acute back pain 04/17/2018   History of renal stone 04/17/2018   Urinary frequency 04/17/2018   Laceration of left hand 01/31/2018   Decreased range of motion of finger of left hand 01/31/2018   Localized swelling on left hand 01/31/2018   Chronic migraine without aura without status migrainosus, not intractable 11/01/2016   History of migraine 06/10/2016   Diarrhea 05/20/2016   Pain in the chest 05/11/2016   Dizziness and giddiness 05/11/2016   History of hypertension 05/11/2016   Hemorrhoidal skin tag 04/07/2015   IUD (intrauterine device) in place 05/03/2013   Allergic rhinitis 01/11/2013   Current smoker 01/11/2013   Weight gain 06/26/2012   Cervical stenosis (uterine cervix) 04/25/2012   Anticardiolipin antibody positive    Hyperlipidemia    Hypertension    History of trichomoniasis 01/18/2011    Class: History of   THYROID NODULE, RIGHT 02/05/2008   GERD 02/05/2008   NECK PAIN 02/05/2008    Past Medical History:  Diagnosis Date   Anal fistula    intersphincteric    Anemia    Anticardiolipin antibody positive    Cervicalgia    followed by dr Sherley Bounds   Depression    History of herpes genitalis    many yrs ago   History of kidney stones    Hyperlipidemia    Hypertension    followed by pcp   (11-25-2019  per  pt had stress test approx. 2018 w/ Dr Wynonia Lawman, told normal)   Migraines    neurologist--- dr Jaynee Eagles   OSA on CPAP    TMJ (dislocation of temporomandibular joint)    11-25-2019  jaw pops, no appliance   Wears glasses     Family History  Problem Relation Age of Onset   Heart disease Mother 14       died suddenly of MI   Heart attack Mother    Diabetes Father    Hypertension Father    Cancer Father        colon cancer   Kidney Stones Father    Diabetes Sister    Stroke Sister    Heart disease Maternal Uncle 5       died of MI   Heart disease Maternal Uncle    Breast cancer Maternal Grandmother    Mental illness Son    Healthy  Daughter    Healthy Daughter    Sleep apnea Neg Hx    Past Surgical History:  Procedure Laterality Date   ANTERIOR CERVICAL DECOMP/DISCECTOMY FUSION  12/ 2020   '@[place]'$  [place]     X3   LAST ONE 11-30-2004   CYSTOSCOPY W/ RETROGRADES Right 10/29/2012   Procedure: CYSTOSCOPY WITH RETROGRADE PYELOGRAM and stent placement;  Surgeon: Reece Packer, MD;  [facility];  Service: Urology;  Laterality: Right;  rt stent placement , rt retrograde and cysto    CYSTOSCOPY W/ URETERAL STENT PLACEMENT Right 11/23/2012   Procedure: CYSTOSCOPY WITH STENT REPLACEMENT;  Surgeon: Alexis Frock, MD;  Location: [facility];  Service: Urology;  Laterality: Right;   CYSTOSCOPY/RETROGRADE/URETEROSCOPY/STONE EXTRACTION WITH BASKET Right 11/23/2012   Procedure: CYSTOSCOPY/RETROGRADE/URETEROSCOPY/STONE EXTRACTION WITH BASKET;  Surgeon: Alexis Frock, MD;  Location[facility];  Service: Urology;  Laterality: Right;   EVALUATION UNDER ANESTHESIA WITH ANAL FISSUROTOMY N/A 01/23/2020   Procedure: ANAL EXAM UNDER ANESTHESIA, FISTULOTOMY, SKIN TAG EXCISION;  Surgeon: Leighton Ruff, MD;  Location: [facility];  Service: General;  Laterality: N/A;   INTRAUTERINE DEVICE (IUD) INSERTION      mirena inserted 07-11-18   Social History   Social History Narrative   Lives at home w/ her children   Right-handed   Caffeine: 1 soda per day   Immunization History  Administered Date(s) Administered   DTaP 08/10/2007   Influenza Split 05/21/2014, 05/22/2016, 06/15/2021   Influenza Whole 05/29/2013   Influenza-Unspecified 05/26/2015, 06/22/2018, 06/18/2019, 06/15/2020   PFIZER(Purple Top)SARS-COV-2 Vaccination 01/13/2020, 02/03/2020   Tdap 01/22/2018   Zoster Recombinat (Shingrix) 06/14/2017, 11/06/2020     Objective: Vital Signs: BP 118/81 (BP Location: Left Arm, Patient Position: Sitting, Cuff Size: Normal)   Pulse 69   Resp 16   Ht 5' 3.25" (1.607 m)   Wt 164 lb 12.8 oz (74.8 kg)   BMI 28.96 kg/m    Physical Exam Vitals and nursing note reviewed.  Constitutional:      Appearance: She is well-developed.  HENT:     Head: Normocephalic and atraumatic.  Eyes:     Conjunctiva/sclera: Conjunctivae normal.  Cardiovascular:     Rate and Rhythm: Normal rate and regular rhythm.     Heart sounds: Normal heart sounds.  Pulmonary:     Effort: Pulmonary effort is normal.     Breath sounds: Normal breath sounds.  Abdominal:     General: Bowel sounds are normal.     Palpations: Abdomen is soft.  Musculoskeletal:     Cervical back: Normal range of motion.  Lymphadenopathy:     Cervical: No cervical adenopathy.  Skin:    General: Skin is warm and dry.     Capillary Refill: Capillary refill takes less than 2 seconds.  Neurological:     Mental Status: She is alert and oriented to person, place, and time.  Psychiatric:        Behavior: Behavior normal.      Musculoskeletal Exam: She has some limitation with lateral rotation of the cervical spine without discomfort.  Shoulder joints, elbow joints, wrist joints, MCPs PIPs and DIPs with good range of motion.  She had mild tenderness over PIP and DIP joints with no synovitis.  Hip joints, knee joints, ankles, MTPs and PIPs with  good range of motion.  She had bilateral pes cavus.  She had tenderness over bilateral trochanteric bursa consistent with trochanteric bursitis.  There was no Planter fasciitis or Achilles tendinitis.  CDAI Exam: CDAI Score: -- Patient Global: --; Provider Global: -- Swollen: --; Tender: -- Joint Exam 03/24/2022   No joint exam has been documented for this visit   There is currently no information documented on the homunculus. Go to the Rheumatology activity and complete the homunculus joint exam.  Investigation: No additional findings.  Imaging: XR Hand 2 View Left  Result Date: 03/24/2022 CMC, PIP and DIP narrowing was noted.  No MCP, intercarpal or radiocarpal joint space narrowing was noted.  No erosive changes were noted. 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  XR Hand 2 View Right  Result Date: 03/24/2022 CMC, PIP and DIP narrowing was noted.  No MCP, intercarpal or radiocarpal joint space narrowing was noted.  No erosive changes were noted. 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  US Transvaginal Non-OB  Result Date: 03/14/2022 T/V images.  Anteverted uterus normal in size and shape with no myometrial mass.  The uterus is measured at 10.15 x 6.96 x 5.08 cm.  Thin and symmetrical endometrial lining measured at 2.27 mm with trace amount of fluid within the cavity and no obvious mass or thickening seen.  The IUD is noted in proper intra uterine position.  Both ovaries are normal in size with sparse follicles.  A simple follicle is measured at 2 cm on the left ovary.  No adnexal mass.  No free fluid in the pelvis.    Recent Labs: Lab Results  Component Value Date   WBC 8.0 05/28/2021   HGB 12.4 05/28/2021   PLT 272 05/28/2021   NA 140 05/28/2021   K 4.2 05/28/2021   CL 107 (H) 05/28/2021   CO2 17 (L) 05/28/2021   GLUCOSE 84 05/28/2021   BUN 10 05/28/2021   CREATININE 0.82 05/28/2021   BILITOT  0.3 05/28/2021   ALKPHOS 94 05/28/2021   AST 22 05/28/2021   ALT 18 05/28/2021   PROT 6.4 05/28/2021   ALBUMIN 4.2 05/28/2021   CALCIUM 9.3 05/28/2021   GFRAA 105 06/26/2020   QFTBGOLDPLUS Negative 11/02/2018    Speciality Comments: No specialty comments available.  Procedures:  No procedures performed Allergies: Imitrex [sumatriptan base], Zomig, Avelox [moxifloxacin hcl in nacl], Morphine, Septra [bactrim], Sulfamethoxazole, Sumatriptan, and Trimethoprim   Assessment / Plan:     Visit Diagnoses: Raynaud's phenomenon without gangrene - 11/02/18: ANA negative, dsDNA<1, RF<10, CK 96, antiCCP 8, uric acid 4.7 -patient gives history of severe Raynauds which is started over the last year.  She brought pictures on the cell phone with her fingers being white.  She has not noticed any digital cyanosis.  There is no history of digital ulcers.  No nailbed capillary changes or sclerodactyly or Telengectesia's were noted.  She had good capillary refill.  No Raynauds was noted on her feet.  I will obtain following labs today.  Plan: Urinalysis, Routine w reflex microscopic, Sedimentation rate, ANA, Anti-scleroderma antibody, RNP Antibody, Anti-Smith antibody, Sjogrens syndrome-A extractable nuclear antibody, Sjogrens syndrome-B extractable nuclear antibody, Anti-DNA antibody, double-stranded, C3 and C4, Beta-2 glycoprotein antibodies, Cardiolipin antibodies, IgG, IgM, IgA, Cryoglobulin, Pan-ANCA  Hair loss-she gives history of hair loss over the years.  Dry mouth-she gives history of dry mouth.  Which could be related to some of the medications she is taking.  Anticardiolipin antibody positive-patient states that she was found to have a positive anticardiolipin antibodies in year 2000 while she was pregnant.  She  denies any history of preeclampsia or DVTs.  She stayed on aspirin while she was pregnant.  Photosensitization due to sun-she has noticed increased photosensitivity over the last 3 years.  She  states she had severe sunburn at one time requiring steroids.  Other fatigue -she been experiencing increased fatigue.  Plan: CBC with Differential/Platelet, COMPLETE METABOLIC PANEL WITH GFR, TSH  Pain in both hands -she complains of pain and discomfort in her bilateral hands.  No synovitis was noted.  She had some tenderness over PIP and DIP joints.  Clinical and radiographic findings were consistent with early osteoarthritis.  Plan: Rheumatoid factor, Cyclic citrul peptide antibody, IgG, XR Hand 2 View Left, XR Hand 2 View Right.  X-ray showed mild PIP, DIP and CMC narrowing.  Joint protection was discussed.  A handout on hand exercises was given.  Bilateral trochanteric bursitis-she complains of discomfort in the trochanteric region.  She had tenderness on palpation of bilateral trochanteric bursa consistent with trochanteric bursitis.  Handout on IT band stretches was given.  Patellofemoral pain syndrome of both knees she-she has off-and-on discomfort in her knee.  DDD (degenerative disc disease), cervical - s/p fusion 2020 by Dr. Ronnald Ramp.  She still has some discomfort.  She takes muscle relaxers and Lyrica.  Spinal stenosis of cervical region  Plantar fasciitis-patient gives history of Planter fasciitis in the past.  She states that improved after physical therapy and treatment.  She had no tenderness over plantar fascia today.  Myalgia -she gives history of generalized muscle pain.  Plan: CK  Vitamin D deficiency -she gives history of vitamin D deficiency.  She was treated with vitamin D in the past.  She has been experiencing fatigue I will obtain vitamin D level today.  Plan: VITAMIN D 25 Hydroxy (Vit-D Deficiency, Fractures)  Primary hypertension-blood pressure was normal today.  History of hyperlipidemia-she is on rosuvastatin.  Gastroesophageal reflux disease without esophagitis-she denies any history of reflux or esophagitis today.  Chronic migraine without aura without status  migrainosus, not intractable  Overactive bladder  IUD (intrauterine device) in place  History of renal stone  Former smoker - 1PPDx 15 years, vaping now.  Family history of MS-sister  Family history of primary biliary cholangitis-Sister  Orders: Orders Placed This Encounter  Procedures   XR Hand 2 View Left   XR Hand 2 View Right   CBC with Differential/Platelet   COMPLETE METABOLIC PANEL WITH GFR   Urinalysis, Routine w reflex microscopic   Sedimentation rate   Rheumatoid factor   Cyclic citrul peptide antibody, IgG   ANA   Anti-scleroderma antibody   RNP Antibody   Anti-Smith antibody   Sjogrens syndrome-A extractable nuclear antibody   Sjogrens syndrome-B extractable nuclear antibody   Anti-DNA antibody, double-stranded   C3 and C4   Beta-2 glycoprotein antibodies   Cardiolipin antibodies, IgG, IgM, IgA   Cryoglobulin   Pan-ANCA   CK   TSH   VITAMIN D 25 Hydroxy (Vit-D Deficiency, Fractures)   No orders of the defined types were placed in this encounter.   Follow-Up Instructions: Return for Raynauds.   Bo Merino, MD  Note - This record has been created using Editor, commissioning.  Chart creation errors have been sought, but may not always  have been located. Such creation errors do not reflect on  the standard of medical care.

## 2022-03-11 ENCOUNTER — Other Ambulatory Visit (HOSPITAL_COMMUNITY): Payer: Self-pay

## 2022-03-15 ENCOUNTER — Other Ambulatory Visit (HOSPITAL_COMMUNITY): Payer: Self-pay

## 2022-03-16 ENCOUNTER — Other Ambulatory Visit: Payer: Self-pay | Admitting: Nurse Practitioner

## 2022-03-16 ENCOUNTER — Other Ambulatory Visit (HOSPITAL_COMMUNITY): Payer: Self-pay

## 2022-03-16 DIAGNOSIS — B009 Herpesviral infection, unspecified: Secondary | ICD-10-CM

## 2022-03-16 NOTE — Telephone Encounter (Signed)
Last annual exam 06/12/22 It appears this refill is for future refills per note on Rx "Last dispensed: 02/11/2022"

## 2022-03-17 ENCOUNTER — Other Ambulatory Visit: Payer: Self-pay | Admitting: Nurse Practitioner

## 2022-03-17 ENCOUNTER — Other Ambulatory Visit (HOSPITAL_COMMUNITY): Payer: Self-pay

## 2022-03-17 DIAGNOSIS — B009 Herpesviral infection, unspecified: Secondary | ICD-10-CM

## 2022-03-17 MED ORDER — VALACYCLOVIR HCL 1 G PO TABS
1000.0000 mg | ORAL_TABLET | Freq: Every day | ORAL | 1 refills | Status: DC
  Filled 2022-03-17 – 2022-05-12 (×2): qty 90, 90d supply, fill #0
  Filled 2022-08-09: qty 90, 90d supply, fill #1

## 2022-03-17 NOTE — Telephone Encounter (Signed)
You prescribed in 04/2021 Annual exam with ML on 02/2022

## 2022-03-23 ENCOUNTER — Telehealth: Payer: Self-pay | Admitting: Neurology

## 2022-03-23 NOTE — Telephone Encounter (Signed)
PA completed for the patient on [facility] KEY: [id number] Approved immediately The authorization is effective for a maximum of 12 fills from 03/23/2022 to 03/23/2023,

## 2022-03-24 ENCOUNTER — Ambulatory Visit (INDEPENDENT_AMBULATORY_CARE_PROVIDER_SITE_OTHER): Payer: 59

## 2022-03-24 ENCOUNTER — Encounter: Payer: Self-pay | Admitting: Rheumatology

## 2022-03-24 ENCOUNTER — Ambulatory Visit: Payer: 59 | Attending: Rheumatology | Admitting: Rheumatology

## 2022-03-24 VITALS — BP 118/81 | HR 69 | Resp 16 | Ht 63.25 in | Wt 164.8 lb

## 2022-03-24 DIAGNOSIS — M79641 Pain in right hand: Secondary | ICD-10-CM | POA: Diagnosis not present

## 2022-03-24 DIAGNOSIS — M222X1 Patellofemoral disorders, right knee: Secondary | ICD-10-CM | POA: Diagnosis not present

## 2022-03-24 DIAGNOSIS — F17200 Nicotine dependence, unspecified, uncomplicated: Secondary | ICD-10-CM

## 2022-03-24 DIAGNOSIS — R682 Dry mouth, unspecified: Secondary | ICD-10-CM | POA: Diagnosis not present

## 2022-03-24 DIAGNOSIS — M722 Plantar fascial fibromatosis: Secondary | ICD-10-CM | POA: Diagnosis not present

## 2022-03-24 DIAGNOSIS — L659 Nonscarring hair loss, unspecified: Secondary | ICD-10-CM | POA: Diagnosis not present

## 2022-03-24 DIAGNOSIS — Z87442 Personal history of urinary calculi: Secondary | ICD-10-CM

## 2022-03-24 DIAGNOSIS — M79642 Pain in left hand: Secondary | ICD-10-CM

## 2022-03-24 DIAGNOSIS — G43709 Chronic migraine without aura, not intractable, without status migrainosus: Secondary | ICD-10-CM

## 2022-03-24 DIAGNOSIS — N3281 Overactive bladder: Secondary | ICD-10-CM

## 2022-03-24 DIAGNOSIS — R5383 Other fatigue: Secondary | ICD-10-CM | POA: Diagnosis not present

## 2022-03-24 DIAGNOSIS — I7300 Raynaud's syndrome without gangrene: Secondary | ICD-10-CM | POA: Diagnosis not present

## 2022-03-24 DIAGNOSIS — Z8639 Personal history of other endocrine, nutritional and metabolic disease: Secondary | ICD-10-CM

## 2022-03-24 DIAGNOSIS — R760 Raised antibody titer: Secondary | ICD-10-CM

## 2022-03-24 DIAGNOSIS — M791 Myalgia, unspecified site: Secondary | ICD-10-CM | POA: Diagnosis not present

## 2022-03-24 DIAGNOSIS — E559 Vitamin D deficiency, unspecified: Secondary | ICD-10-CM

## 2022-03-24 DIAGNOSIS — M4802 Spinal stenosis, cervical region: Secondary | ICD-10-CM | POA: Diagnosis not present

## 2022-03-24 DIAGNOSIS — M5030 Other cervical disc degeneration, unspecified cervical region: Secondary | ICD-10-CM | POA: Diagnosis not present

## 2022-03-24 DIAGNOSIS — L568 Other specified acute skin changes due to ultraviolet radiation: Secondary | ICD-10-CM | POA: Diagnosis not present

## 2022-03-24 DIAGNOSIS — I10 Essential (primary) hypertension: Secondary | ICD-10-CM

## 2022-03-24 DIAGNOSIS — Z87891 Personal history of nicotine dependence: Secondary | ICD-10-CM

## 2022-03-24 DIAGNOSIS — M7061 Trochanteric bursitis, right hip: Secondary | ICD-10-CM

## 2022-03-24 DIAGNOSIS — M7062 Trochanteric bursitis, left hip: Secondary | ICD-10-CM

## 2022-03-24 DIAGNOSIS — K219 Gastro-esophageal reflux disease without esophagitis: Secondary | ICD-10-CM

## 2022-03-24 DIAGNOSIS — Z820 Family history of epilepsy and other diseases of the nervous system: Secondary | ICD-10-CM

## 2022-03-24 DIAGNOSIS — Z975 Presence of (intrauterine) contraceptive device: Secondary | ICD-10-CM

## 2022-03-24 DIAGNOSIS — M5020 Other cervical disc displacement, unspecified cervical region: Secondary | ICD-10-CM

## 2022-03-24 DIAGNOSIS — M222X2 Patellofemoral disorders, left knee: Secondary | ICD-10-CM

## 2022-03-24 NOTE — Patient Instructions (Signed)
Iliotibial Band Syndrome Rehab Ask your health care provider which exercises are safe for you. Do exercises exactly as told by your health care provider and adjust them as directed. It is normal to feel mild stretching, pulling, tightness, or discomfort as you do these exercises. Stop right away if you feel sudden pain or your pain gets significantly worse. Do not begin these exercises until told by your health care provider. Stretching and range-of-motion exercises These exercises warm up your muscles and joints and improve the movement and flexibility of your hip and pelvis. Quadriceps stretch, prone  Lie on your abdomen (prone position) on a firm surface, such as a bed or padded floor. Bend your left / right knee and reach back to hold your ankle or pant leg. If you cannot reach your ankle or pant leg, loop a belt around your foot and grab the belt instead. Gently pull your heel toward your buttocks. Your knee should not slide out to the side. You should feel a stretch in the front of your thigh and knee (quadriceps). Hold this position for __________ seconds. Repeat __________ times. Complete this exercise __________ times a day. Iliotibial band stretch An iliotibial band is a strong band of muscle tissue that runs from the outer side of your hip to the outer side of your thigh and knee. Lie on your side with your left / right leg in the top position. Bend both of your knees and grab your left / right ankle. Stretch out your bottom arm to help you balance. Slowly bring your top knee back so your thigh goes behind your trunk. Slowly lower your top leg toward the floor until you feel a gentle stretch on the outside of your left / right hip and thigh. If you do not feel a stretch and your knee will not fall farther, place the heel of your other foot on top of your knee and pull your knee down toward the floor with your foot. Hold this position for __________ seconds. Repeat __________ times.  Complete this exercise __________ times a day. Strengthening exercises These exercises build strength and endurance in your hip and pelvis. Endurance is the ability to use your muscles for a long time, even after they get tired. Straight leg raises, side-lying This exercise strengthens the muscles that rotate the leg at the hip and move it away from your body (hip abductors). Lie on your side with your left / right leg in the top position. Lie so your head, shoulder, hip, and knee line up. You may bend your bottom knee to help you balance. Roll your hips slightly forward so your hips are stacked directly over each other and your left / right knee is facing forward. Tense the muscles in your outer thigh and lift your top leg 4-6 inches (10-15 cm). Hold this position for __________ seconds. Slowly lower your leg to return to the starting position. Let your muscles relax completely before doing another repetition. Repeat __________ times. Complete this exercise __________ times a day. Leg raises, prone This exercise strengthens the muscles that move the hips backward (hip extensors). Lie on your abdomen (prone position) on your bed or a firm surface. You can put a pillow under your hips if that is more comfortable for your lower back. Bend your left / right knee so your foot is straight up in the air. Squeeze your buttocks muscles and lift your left / right thigh off the bed. Do not let your back arch. Tense   your thigh muscle as hard as you can without increasing any knee pain. Hold this position for __________ seconds. Slowly lower your leg to return to the starting position and allow it to relax completely. Repeat __________ times. Complete this exercise __________ times a day. Hip hike Stand sideways on a bottom step. Stand on your left / right leg with your other foot unsupported next to the step. You can hold on to a railing or wall for balance if needed. Keep your knees straight and your  torso square. Then lift your left / right hip up toward the ceiling. Slowly let your left / right hip lower toward the floor, past the starting position. Your foot should get closer to the floor. Do not lean or bend your knees. Repeat __________ times. Complete this exercise __________ times a day. This information is not intended to replace advice given to you by your health care provider. Make sure you discuss any questions you have with your health care provider. Document Revised: 10/16/2019 Document Reviewed: 10/16/2019 Elsevier Patient Education  2023 [employer] Hand Exercises Hand exercises can be helpful for almost anyone. These exercises can strengthen the hands, improve flexibility and movement, and increase blood flow to the hands. These results can make work and daily tasks easier. Hand exercises can be especially helpful for people who have joint pain from arthritis or have nerve damage from overuse (carpal tunnel syndrome). These exercises can also help people who have injured a hand. Exercises Most of these hand exercises are gentle stretching and motion exercises. It is usually safe to do them often throughout the day. Warming up your hands before exercise may help to reduce stiffness. You can do this with gentle massage or by placing your hands in warm water for 10-15 minutes. It is normal to feel some stretching, pulling, tightness, or mild discomfort as you begin new exercises. This will gradually improve. Stop an exercise right away if you feel sudden, severe pain or your pain gets worse. Ask your health care provider which exercises are best for you. Knuckle bend or "claw" fist  Stand or sit with your arm, hand, and all five fingers pointed straight up. Make sure to keep your wrist straight during the exercise. Gently bend your fingers down toward your palm until the tips of your fingers are touching the top of your palm. Keep your big knuckle straight and just bend the small  knuckles in your fingers. Hold this position for __________ seconds. Straighten (extend) your fingers back to the starting position. Repeat this exercise 5-10 times with each hand. Full finger fist  Stand or sit with your arm, hand, and all five fingers pointed straight up. Make sure to keep your wrist straight during the exercise. Gently bend your fingers into your palm until the tips of your fingers are touching the middle of your palm. Hold this position for __________ seconds. Extend your fingers back to the starting position, stretching every joint fully. Repeat this exercise 5-10 times with each hand. Straight fist Stand or sit with your arm, hand, and all five fingers pointed straight up. Make sure to keep your wrist straight during the exercise. Gently bend your fingers at the big knuckle, where your fingers meet your hand, and the middle knuckle. Keep the knuckle at the tips of your fingers straight and try to touch the bottom of your palm. Hold this position for __________ seconds. Extend your fingers back to the starting position, stretching every joint fully. Repeat this   exercise 5-10 times with each hand. Tabletop  Stand or sit with your arm, hand, and all five fingers pointed straight up. Make sure to keep your wrist straight during the exercise. Gently bend your fingers at the big knuckle, where your fingers meet your hand, as far down as you can while keeping the small knuckles in your fingers straight. Think of forming a tabletop with your fingers. Hold this position for __________ seconds. Extend your fingers back to the starting position, stretching every joint fully. Repeat this exercise 5-10 times with each hand. Finger spread  Place your hand flat on a table with your palm facing down. Make sure your wrist stays straight as you do this exercise. Spread your fingers and thumb apart from each other as far as you can until you feel a gentle stretch. Hold this position  for __________ seconds. Bring your fingers and thumb tight together again. Hold this position for __________ seconds. Repeat this exercise 5-10 times with each hand. Making circles  Stand or sit with your arm, hand, and all five fingers pointed straight up. Make sure to keep your wrist straight during the exercise. Make a circle by touching the tip of your thumb to the tip of your index finger. Hold for __________ seconds. Then open your hand wide. Repeat this motion with your thumb and each finger on your hand. Repeat this exercise 5-10 times with each hand. Thumb motion  Sit with your forearm resting on a table and your wrist straight. Your thumb should be facing up toward the ceiling. Keep your fingers relaxed as you move your thumb. Lift your thumb up as high as you can toward the ceiling. Hold for __________ seconds. Bend your thumb across your palm as far as you can, reaching the tip of your thumb for the small finger (pinkie) side of your palm. Hold for __________ seconds. Repeat this exercise 5-10 times with each hand. Grip strengthening  Hold a stress ball or other soft ball in the middle of your hand. Slowly increase the pressure, squeezing the ball as much as you can without causing pain. Think of bringing the tips of your fingers into the middle of your palm. All of your finger joints should bend when doing this exercise. Hold your squeeze for __________ seconds, then relax. Repeat this exercise 5-10 times with each hand. Contact a health care provider if: Your hand pain or discomfort gets much worse when you do an exercise. Your hand pain or discomfort does not improve within 2 hours after you exercise. If you have any of these problems, stop doing these exercises right away. Do not do them again unless your health care provider says that you can. Get help right away if: You develop sudden, severe hand pain or swelling. If this happens, stop doing these exercises right away.  Do not do them again unless your health care provider says that you can. This information is not intended to replace advice given to you by your health care provider. Make sure you discuss any questions you have with your health care provider. Document Revised: 11/26/2020 Document Reviewed: 11/26/2020 Elsevier Patient Education  2023 Else[employer] Inc.  

## 2022-03-27 ENCOUNTER — Encounter (INDEPENDENT_AMBULATORY_CARE_PROVIDER_SITE_OTHER): Payer: Self-pay

## 2022-03-30 DIAGNOSIS — G4733 Obstructive sleep apnea (adult) (pediatric): Secondary | ICD-10-CM | POA: Diagnosis not present

## 2022-03-31 ENCOUNTER — Encounter: Payer: Self-pay | Admitting: Rheumatology

## 2022-03-31 NOTE — Telephone Encounter (Signed)
Please schedule a follow up appointment.

## 2022-04-01 LAB — CBC WITH DIFFERENTIAL/PLATELET
Absolute Monocytes: 499 cells/uL (ref 200–950)
Basophils Absolute: 47 cells/uL (ref 0–200)
Basophils Relative: 0.6 %
Eosinophils Absolute: 70 cells/uL (ref 15–500)
Eosinophils Relative: 0.9 %
HCT: 35.9 % (ref 35.0–45.0)
Hemoglobin: 12.5 g/dL (ref 11.7–15.5)
Lymphs Abs: 2207 cells/uL (ref 850–3900)
MCH: 33.1 pg — ABNORMAL HIGH (ref 27.0–33.0)
MCHC: 34.8 g/dL (ref 32.0–36.0)
MCV: 95 fL (ref 80.0–100.0)
MPV: 9.5 fL (ref 7.5–12.5)
Monocytes Relative: 6.4 %
Neutro Abs: 4976 cells/uL (ref 1500–7800)
Neutrophils Relative %: 63.8 %
Platelets: 275 10*3/uL (ref 140–400)
RBC: 3.78 10*6/uL — ABNORMAL LOW (ref 3.80–5.10)
RDW: 12.6 % (ref 11.0–15.0)
Total Lymphocyte: 28.3 %
WBC: 7.8 10*3/uL (ref 3.8–10.8)

## 2022-04-01 LAB — COMPLETE METABOLIC PANEL WITH GFR
AG Ratio: 1.6 (calc) (ref 1.0–2.5)
ALT: 10 U/L (ref 6–29)
AST: 12 U/L (ref 10–35)
Albumin: 4.2 g/dL (ref 3.6–5.1)
Alkaline phosphatase (APISO): 82 U/L (ref 37–153)
BUN: 12 mg/dL (ref 7–25)
CO2: 25 mmol/L (ref 20–32)
Calcium: 9.4 mg/dL (ref 8.6–10.4)
Chloride: 107 mmol/L (ref 98–110)
Creat: 0.79 mg/dL (ref 0.50–1.03)
Globulin: 2.6 g/dL (calc) (ref 1.9–3.7)
Glucose, Bld: 76 mg/dL (ref 65–99)
Potassium: 3.8 mmol/L (ref 3.5–5.3)
Sodium: 141 mmol/L (ref 135–146)
Total Bilirubin: 0.7 mg/dL (ref 0.2–1.2)
Total Protein: 6.8 g/dL (ref 6.1–8.1)
eGFR: 89 mL/min/{1.73_m2} (ref >=60)

## 2022-04-01 LAB — ANTI-NUCLEAR AB-TITER (ANA TITER): ANA Titer 1: 1:40 {titer} — ABNORMAL HIGH

## 2022-04-01 LAB — PAN-ANCA
ANCA SCREEN: NEGATIVE
Myeloperoxidase Abs: <1 AI (ref <1.0)
Serine Protease 3: <1 AI (ref <1.0)

## 2022-04-01 LAB — ANTI-SCLERODERMA ANTIBODY: Scleroderma (Scl-70) (ENA) Antibody, IgG: <1 AI

## 2022-04-01 LAB — CRYOGLOBULIN: Cryoglobulin, Qualitative Analysis: NOT DETECTED

## 2022-04-01 LAB — URINALYSIS, ROUTINE W REFLEX MICROSCOPIC
Bilirubin Urine: NEGATIVE
Glucose, UA: NEGATIVE
Hgb urine dipstick: NEGATIVE
Ketones, ur: NEGATIVE
Leukocytes,Ua: NEGATIVE
Nitrite: NEGATIVE
Protein, ur: NEGATIVE
Specific Gravity, Urine: 1.017 (ref 1.001–1.035)
pH: 6 (ref 5.0–8.0)

## 2022-04-01 LAB — C3 AND C4
C3 Complement: 130 mg/dL (ref 83–193)
C4 Complement: 29 mg/dL (ref 15–57)

## 2022-04-01 LAB — BETA-2 GLYCOPROTEIN ANTIBODIES
Beta-2 Glyco 1 IgA: <2 U/mL (ref <20.0)
Beta-2 Glyco 1 IgM: <2 U/mL (ref <20.0)
Beta-2 Glyco I IgG: <2 U/mL (ref <20.0)

## 2022-04-01 LAB — CYCLIC CITRUL PEPTIDE ANTIBODY, IGG: Cyclic Citrullin Peptide Ab: <16 UNITS

## 2022-04-01 LAB — CARDIOLIPIN ANTIBODIES, IGG, IGM, IGA
Anticardiolipin IgA: <2 APL-U/mL (ref <20.0)
Anticardiolipin IgG: <2 GPL-U/mL (ref <20.0)
Anticardiolipin IgM: <2 MPL-U/mL (ref <20.0)

## 2022-04-01 LAB — ANTI-SMITH ANTIBODY: ENA SM Ab Ser-aCnc: <1 AI

## 2022-04-01 LAB — CK: Total CK: 97 U/L (ref 29–143)

## 2022-04-01 LAB — ANTI-DNA ANTIBODY, DOUBLE-STRANDED: ds DNA Ab: <1 IU/mL

## 2022-04-01 LAB — SEDIMENTATION RATE: Sed Rate: 11 mm/h (ref 0–30)

## 2022-04-01 LAB — ANA: Anti Nuclear Antibody (ANA): POSITIVE — AB

## 2022-04-01 LAB — RHEUMATOID FACTOR: Rheumatoid fact SerPl-aCnc: <14 IU/mL (ref <14)

## 2022-04-01 LAB — TSH: TSH: 2.02 mIU/L

## 2022-04-01 LAB — SJOGRENS SYNDROME-B EXTRACTABLE NUCLEAR ANTIBODY: SSB (La) (ENA) Antibody, IgG: <1 AI

## 2022-04-01 LAB — RNP ANTIBODY: Ribonucleic Protein(ENA) Antibody, IgG: <1 AI

## 2022-04-01 LAB — SJOGRENS SYNDROME-A EXTRACTABLE NUCLEAR ANTIBODY: SSA (Ro) (ENA) Antibody, IgG: <1 AI

## 2022-04-01 LAB — VITAMIN D 25 HYDROXY (VIT D DEFICIENCY, FRACTURES): Vit D, 25-Hydroxy: 30 ng/mL (ref 30–100)

## 2022-04-01 NOTE — Progress Notes (Unsigned)
Office Visit Note  Patient: Kelly [name]             Date of Birth: 07/10/1966           MRN: [id number]             PCP: Elby Beck, FNP Referring: Elby Beck, FNP Visit Date: 04/06/2022 Occupation: @GUAROCC @  Subjective:  Raynaud's phenomenon and positive ANA  History of Present Illness: Kelly [name] is a 56 y.o. female with history of Raynauds, positive ANA, arthralgias, photosensitivity, dry mouth and hair loss.  She returns today to discuss lab results.  She continues to have joint stiffness which she describes in her hands, hips and her neck region.  She states the Planter fasciitis has improved.  She continues to have some generalized muscle pain.  She gives history of dry mouth.  She denies any history of dry eyes.  She continues to have fatigue and hair loss.  She has also developed photosensitivity.  Activities of Daily Living:  Patient reports morning stiffness for 1 hour.   Patient Reports nocturnal pain.  Difficulty dressing/grooming: Denies Difficulty climbing stairs: Reports Difficulty getting out of chair: Denies Difficulty using hands for taps, buttons, cutlery, and/or writing: Reports  Review of Systems  Constitutional:  Positive for fatigue.  HENT:  Positive for mouth dryness. Negative for mouth sores.   Eyes:  Negative for dryness.  Respiratory:  Negative for shortness of breath.   Cardiovascular:  Negative for chest pain and palpitations.  Gastrointestinal:  Positive for constipation and diarrhea. Negative for blood in stool.  Endocrine: Positive for increased urination.  Genitourinary:  Negative for involuntary urination.  Musculoskeletal:  Positive for joint pain, joint pain, myalgias, morning stiffness, muscle tenderness and myalgias. Negative for joint swelling and muscle weakness.  Skin:  Positive for color change, hair loss and sensitivity to sunlight. Negative for rash.  Allergic/Immunologic: Negative for susceptible to infections.   Neurological:  Positive for headaches. Negative for dizziness.  Hematological:  Negative for swollen glands.  Psychiatric/Behavioral:  Negative for depressed mood and sleep disturbance. The patient is not nervous/anxious.     PMFS History:  Patient Active Problem List   Diagnosis Date Noted   Numbness and tingling in both hands 07/25/2020   Overweight 07/25/2020   Positive depression screening 07/25/2020   Stressful life event affecting family 07/12/2019   Caregiver burden 07/12/2019   Tobacco abuse counseling 07/12/2019   Protrusion of cervical intervertebral disc 04/10/2019   Spinal stenosis of cervical region 04/10/2019   Overactive bladder 11/19/2018   Photosensitization due to sun 11/02/2018   Generalized pain 11/02/2018   High risk medication use 11/02/2018   Foot pain, bilateral 05/10/2018   Heel pain, bilateral 05/10/2018   Plantar fasciitis 05/10/2018   Left knee pain 05/10/2018   Patellofemoral pain syndrome of both knees 05/10/2018   Generalized abdominal pain 04/17/2018   Flank pain 04/17/2018   Acute back pain 04/17/2018   History of renal stone 04/17/2018   Urinary frequency 04/17/2018   Laceration of left hand 01/31/2018   Decreased range of motion of finger of left hand 01/31/2018   Localized swelling on left hand 01/31/2018   Chronic migraine without aura without status migrainosus, not intractable 11/01/2016   History of migraine 06/10/2016   Diarrhea 05/20/2016   Pain in the chest 05/11/2016   Dizziness and giddiness 05/11/2016   History of hypertension 05/11/2016   Hemorrhoidal skin tag 04/07/2015   IUD (intrauterine device) in place  05/03/2013   Allergic rhinitis 01/11/2013   Current smoker 01/11/2013   Weight gain 06/26/2012   Cervical stenosis (uterine cervix) 04/25/2012   Anticardiolipin antibody positive    Hyperlipidemia    Hypertension    History of trichomoniasis 01/18/2011    Class: History of   THYROID NODULE, RIGHT 02/05/2008   GERD  02/05/2008   NECK PAIN 02/05/2008    Past Medical History:  Diagnosis Date   Anal fistula    intersphincteric    Anemia    Anticardiolipin antibody positive    Cervicalgia    followed by dr Sherley Bounds   Depression    History of herpes genitalis    many yrs ago   History of kidney stones    Hyperlipidemia    Hypertension    followed by pcp   (11-25-2019  per pt had stress test approx. 2018 w/ Dr Wynonia Lawman, told normal)   Migraines    neurologist--- dr Jaynee Eagles   OSA on CPAP    TMJ (dislocation of temporomandibular joint)    11-25-2019  jaw pops, no appliance   Wears glasses     Family History  Problem Relation Age of Onset   Heart disease Mother 78       died suddenly of MI   Heart attack Mother    Diabetes Father    Hypertension Father    Cancer Father        colon cancer   Kidney Stones Father    Diabetes Sister    Stroke Sister    Heart disease Maternal Uncle 19       died of MI   Heart disease Maternal Uncle    Breast cancer Maternal Grandmother    Mental illness Son    Healthy Daughter    Healthy Daughter    Sleep apnea Neg Hx    Past Surgical History:  Procedure Laterality Date   ANTERIOR CERVICAL DECOMP/DISCECTOMY FUSION  12/ 2020   '@[place]'$  [place]     X3   LAST ONE 11-30-2004   CYSTOSCOPY W/ RETROGRADES Right 10/29/2012   Procedure: CYSTOSCOPY WITH RETROGRADE PYELOGRAM and stent placement;  Surgeon: Reece Packer, MD;  Location: [facility];  Service: Urology;  Laterality: Right;  rt stent placement , rt retrograde and cysto    CYSTOSCOPY W/ URETERAL STENT PLACEMENT Right 11/23/2012   Procedure: CYSTOSCOPY WITH STENT REPLACEMENT;  Surgeon: Alexis Frock, MD;  Location: [facility];  Service: Urology;  Laterality: Right;   CYSTOSCOPY/RETROGRADE/URETEROSCOPY/STONE EXTRACTION WITH BASKET Right 11/23/2012   Procedure: CYSTOSCOPY/RETROGRADE/URETEROSCOPY/STONE EXTRACTION WITH BASKET;   Surgeon: Alexis Frock, MD;  Location: [facility];  Service: Urology;  Laterality: Right;   EVALUATION UNDER ANESTHESIA WITH ANAL FISSUROTOMY N/A 01/23/2020   Procedure: ANAL EXAM UNDER ANESTHESIA, FISTULOTOMY, SKIN TAG EXCISION;  Surgeon: Leighton Ruff, MD;  Location: [facility];  Service: General;  Laterality: N/A;   INTRAUTERINE DEVICE (IUD) INSERTION     mirena inserted 07-11-18   Social History   Social History Narrative   Lives at home w/ her children   Right-handed   Caffeine: 1 soda per day   Immunization History  Administered Date(s) Administered   DTaP 08/10/2007   Influenza Split 05/21/2014, 05/22/2016, 06/15/2021   Influenza Whole 05/29/2013   Influenza-Unspecified 05/26/2015, 06/22/2018, 06/18/2019, 06/15/2020   PFIZER(Purple Top)SARS-COV-2 Vaccination 01/13/2020, 02/03/2020   Tdap 01/22/2018   Zoster Recombinat (Shingrix) 06/14/2017, 11/06/2020     Objective:  Vital Signs: BP 110/75 (BP Location: Left Arm, Patient Position: Sitting, Cuff Size: Normal)   Pulse 80   Resp 15   Ht $R'5\' 4"'RW$  (1.626 m)   Wt 163 lb 12.8 oz (74.3 kg)   BMI 28.12 kg/m    Physical Exam Vitals and nursing note reviewed.  Constitutional:      Appearance: She is well-developed.  HENT:     Head: Normocephalic and atraumatic.  Eyes:     Conjunctiva/sclera: Conjunctivae normal.  Cardiovascular:     Rate and Rhythm: Normal rate and regular rhythm.     Heart sounds: Normal heart sounds.  Pulmonary:     Effort: Pulmonary effort is normal.     Breath sounds: Normal breath sounds.  Abdominal:     General: Bowel sounds are normal.     Palpations: Abdomen is soft.  Musculoskeletal:     Cervical back: Normal range of motion.  Lymphadenopathy:     Cervical: No cervical adenopathy.  Skin:    General: Skin is warm and dry.     Capillary Refill: Capillary refill takes less than 2 seconds.     Comments: No nailbed capillary changes, sclerodactyly or Telengectesia were noted.  She  had good capillary refill.  Neurological:     Mental Status: She is alert and oriented to person, place, and time.  Psychiatric:        Behavior: Behavior normal.      Musculoskeletal Exam: C-spine was in good range of motion with some stiffness.  Shoulder joints, elbow joints, wrist joints with good range of motion.  Mild PIP and DIP thickening with no swelling was noted.  Hip joints and knee joints with good range of motion without any warmth swelling or effusion.  There was no tenderness over ankles or MTPs.  CDAI Exam: CDAI Score: -- Patient Global: --; Provider Global: -- Swollen: --; Tender: -- Joint Exam 04/06/2022   No joint exam has been documented for this visit   There is currently no information documented on the homunculus. Go to the Rheumatology activity and complete the homunculus joint exam.  Investigation: No additional findings.  Imaging: XR Hand 2 View Left  Result Date: 03/24/2022 CMC, PIP and DIP narrowing was noted.  No MCP, intercarpal or radiocarpal joint space narrowing was noted.  No erosive changes were noted. 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  XR Hand 2 View Right  Result Date: 03/24/2022 CMC, PIP and DIP narrowing was noted.  No MCP, intercarpal or radiocarpal joint space narrowing was noted.  No erosive changes were noted. 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   Recent Labs: Lab Results  Component Value Date   WBC 7.8 03/24/2022   HGB 12.5 03/24/2022   PLT 275 03/24/2022   NA 141 03/24/2022   K 3.8 03/24/2022   CL 107 03/24/2022   CO2 25 03/24/2022   GLUCOSE 76 03/24/2022   BUN 12 03/24/2022   CREATININE 0.79 03/24/2022   BILITOT 0.7 03/24/2022   ALKPHOS 94 05/28/2021   AST 12 03/24/2022   ALT 10 03/24/2022   PROT 6.8 03/24/2022   ALBUMIN 4.2 05/28/2021   CALCIUM 9.4 03/24/2022   GFRAA 105 06/26/2020   QFTBGOLDPLUS Negative 11/02/2018   March 24, 2022 UA negative, ANA 1: 40NS, ENA negative, C3-C4 normal, beta-2 negative, anticardiolipin negative, MPO negative, serine protease 3 negative, ESR 11, RF negative, anti-CCP negative, cryoglobulin negative, TSH normal, CK normal, vitamin D  30  Speciality Comments: No specialty comments available.  Procedures:  No procedures performed Allergies: Imitrex [sumatriptan base], Zomig, Avelox [moxifloxacin hcl in nacl], Morphine, Septra [bactrim], Sulfamethoxazole, Sumatriptan, and Trimethoprim   Assessment / Plan:     Visit Diagnoses: Raynaud's phenomenon without gangrene -March 24, 2022 UA negative, ANA 1: 40NS, ENA negative, C3-C4 normal, beta-2 negative, anticardiolipin negative, MPO negative, serine protease 3 negative, ESR 11, RF negative, anti-CCP negative, cryoglobulin negative.  Left findings were discussed with the patient at length.  ANA titer is low positive and not significant.  She appears to have primary Raynaud's.  Detailed counseling regarding rate-controlled's phenomenon was provided.  A handout was also given.  Keeping hands warm and core temperature warm was discussed.  She is also on metoprolol which can makes the symptoms of Raynauds worse.  She may benefit from switching to amlodipine.  Patient will discuss this further with her PCP.  I advised her to contact me if her symptoms get worse or she gets any additional symptoms.  Hair loss-she complains of hair loss.  Use of over-the-counter Rogaine was discussed.  Avoiding chemicals and heat to hair was also advised.  Dry mouth-likely related to the medications.  She is on multiple medications which can be contributing to dry mouth.  Over-the-counter products were discussed at length.  Photosensitization due to sun-use of sunscreen was advised.  Other fatigue-her CBC, CMP, CK and TSH were normal.  Primary osteoarthritis of both hands - Clinical and radiographic findings were consistent with osteoarthritis.  Joint protection muscle  strengthening was discussed.  A handout on hand exercises was given.  Trochanteric bursitis of both hips - A handout on IT band stretches was given at the last visit.  She has noticed improvement in her trochanteric discomfort.  She will continue to do exercises.  Patellofemoral pain syndrome of both knees-she discomfort off and on.  DDD (degenerative disc disease), cervical -she has limited lateral rotation with some stiffness.  Status post fusion in 2020 by Dr. Ronnald Ramp.  She uses muscle relaxers and Lyrica.  Spinal stenosis of cervical region  Plantar fasciitis-improved.  Myalgia-she continues to have myalgias.  CK was normal.  Vitamin D deficiency-vitamin D was low normal.  She was advised to take vitamin D 2000 units daily.  Primary hypertension-blood pressure is normal.  Other medical problems are listed as follows:  History of hyperlipidemia  Gastroesophageal reflux disease without esophagitis  Chronic migraine without aura without status migrainosus, not intractable  Overactive bladder  History of renal stone  IUD (intrauterine device) in place  Former smoker - 1 pack/day for 15 years.  She is vaping now.  Family history of MS (multiple sclerosis)-Sister  Orders: No orders of the defined types were placed in this encounter.  No orders of the defined types were placed in this encounter.    Follow-Up Instructions: Return in about 1 year (around 04/07/2023) for Raynauds.   Bo Merino, MD  Note - This record has been created using Editor, commissioning.  Chart creation errors have been sought, but may not always  have been located. Such creation errors do not reflect on  the standard of medical care.

## 2022-04-03 NOTE — Progress Notes (Signed)
I will discuss results at the follow-up visit.

## 2022-04-05 ENCOUNTER — Telehealth: Payer: Self-pay | Admitting: Neurology

## 2022-04-05 NOTE — Telephone Encounter (Signed)
Called pt and LVM stating that she is needing to schedule her Initial Cpap visit. DME and between dates are in pt's SnapShot.

## 2022-04-06 ENCOUNTER — Encounter: Payer: Self-pay | Admitting: Rheumatology

## 2022-04-06 ENCOUNTER — Ambulatory Visit: Payer: Medicaid Other | Attending: Rheumatology | Admitting: Rheumatology

## 2022-04-06 VITALS — BP 110/75 | HR 80 | Resp 15 | Ht 64.0 in | Wt 163.8 lb

## 2022-04-06 DIAGNOSIS — M7062 Trochanteric bursitis, left hip: Secondary | ICD-10-CM

## 2022-04-06 DIAGNOSIS — L568 Other specified acute skin changes due to ultraviolet radiation: Secondary | ICD-10-CM

## 2022-04-06 DIAGNOSIS — Z87442 Personal history of urinary calculi: Secondary | ICD-10-CM

## 2022-04-06 DIAGNOSIS — M5030 Other cervical disc degeneration, unspecified cervical region: Secondary | ICD-10-CM

## 2022-04-06 DIAGNOSIS — L659 Nonscarring hair loss, unspecified: Secondary | ICD-10-CM

## 2022-04-06 DIAGNOSIS — I10 Essential (primary) hypertension: Secondary | ICD-10-CM

## 2022-04-06 DIAGNOSIS — M19042 Primary osteoarthritis, left hand: Secondary | ICD-10-CM

## 2022-04-06 DIAGNOSIS — G43709 Chronic migraine without aura, not intractable, without status migrainosus: Secondary | ICD-10-CM

## 2022-04-06 DIAGNOSIS — M4802 Spinal stenosis, cervical region: Secondary | ICD-10-CM | POA: Diagnosis not present

## 2022-04-06 DIAGNOSIS — I7300 Raynaud's syndrome without gangrene: Secondary | ICD-10-CM | POA: Diagnosis not present

## 2022-04-06 DIAGNOSIS — E559 Vitamin D deficiency, unspecified: Secondary | ICD-10-CM

## 2022-04-06 DIAGNOSIS — M722 Plantar fascial fibromatosis: Secondary | ICD-10-CM

## 2022-04-06 DIAGNOSIS — Z820 Family history of epilepsy and other diseases of the nervous system: Secondary | ICD-10-CM

## 2022-04-06 DIAGNOSIS — M7061 Trochanteric bursitis, right hip: Secondary | ICD-10-CM

## 2022-04-06 DIAGNOSIS — N3281 Overactive bladder: Secondary | ICD-10-CM

## 2022-04-06 DIAGNOSIS — Z975 Presence of (intrauterine) contraceptive device: Secondary | ICD-10-CM

## 2022-04-06 DIAGNOSIS — Z87891 Personal history of nicotine dependence: Secondary | ICD-10-CM

## 2022-04-06 DIAGNOSIS — R5383 Other fatigue: Secondary | ICD-10-CM | POA: Diagnosis not present

## 2022-04-06 DIAGNOSIS — R682 Dry mouth, unspecified: Secondary | ICD-10-CM | POA: Diagnosis not present

## 2022-04-06 DIAGNOSIS — Z8639 Personal history of other endocrine, nutritional and metabolic disease: Secondary | ICD-10-CM

## 2022-04-06 DIAGNOSIS — M222X1 Patellofemoral disorders, right knee: Secondary | ICD-10-CM

## 2022-04-06 DIAGNOSIS — M79641 Pain in right hand: Secondary | ICD-10-CM

## 2022-04-06 DIAGNOSIS — M19041 Primary osteoarthritis, right hand: Secondary | ICD-10-CM

## 2022-04-06 DIAGNOSIS — M222X2 Patellofemoral disorders, left knee: Secondary | ICD-10-CM

## 2022-04-06 DIAGNOSIS — M791 Myalgia, unspecified site: Secondary | ICD-10-CM | POA: Diagnosis not present

## 2022-04-06 DIAGNOSIS — K219 Gastro-esophageal reflux disease without esophagitis: Secondary | ICD-10-CM

## 2022-04-06 NOTE — Patient Instructions (Addendum)
Raynaud's Phenomenon  Raynaud's phenomenon is a condition that affects the blood vessels (arteries) that carry blood to the fingers and toes. The arteries that supply blood to the ears, lips, nipples, or the tip of the nose might also be affected. Raynaud's phenomenon causes the arteries to become narrow temporarily (spasm). As a result, the flow of blood to the affected areas is temporarily decreased. This usually occurs in response to cold temperatures or stress. During an attack, the skin in the affected areas turns white, then blue, and finally red. A person may also feel tingling or numbness in those areas. Attacks usually last for only a brief period, and then the blood flow to the area returns to normal. In most cases, Raynaud's phenomenon does not cause serious health problems. What are the causes? In many cases, the cause of this condition is not known. The condition may occur on its own (primary Raynaud's phenomenon) or may be associated with other diseases or factors (secondary Raynaud's phenomenon). Possible causes may include: Diseases or medical conditions that damage the arteries. Injuries and repetitive actions that hurt the hands or feet. Being exposed to certain chemicals. Taking medicines that narrow the arteries. Other medical conditions, such as lupus, scleroderma, rheumatoid arthritis, thyroid problems, blood disorders, Sjogren syndrome, or atherosclerosis. What increases the risk? The following factors may make you more likely to develop this condition: Being 20-40 years old. Being female. Having a family history of Raynaud's phenomenon. Living in a cold climate. Smoking. What are the signs or symptoms? Symptoms of this condition usually occur when you are exposed to cold temperatures or when you have emotional stress. The symptoms may last for a few minutes or up to several hours. They usually affect your fingers but may also affect your toes, nipples, lips, ears, or the  tip of your nose. Symptoms may include: Changes in skin color. The skin in the affected areas will turn pale or white. The skin may then change from white to bluish to red as normal blood flow returns to the area. Numbness, tingling, or pain in the affected areas. In severe cases, symptoms may include: Skin sores. Tissues decaying and dying (gangrene). How is this diagnosed? This condition may be diagnosed based on: Your symptoms and medical history. A physical exam. During the exam, you may be asked to put your hands in cold water to check for a reaction to cold temperature. Tests, such as: Blood tests to check for other diseases or conditions. A test to check the movement of blood through your arteries and veins (vascular ultrasound). A test in which the skin at the base of your fingernail is examined under a microscope (nailfold capillaroscopy). How is this treated? During an episode, you can take actions to help symptoms go away faster. Options include moving your arms around in a windmill pattern, warming your fingers under warm water, or placing your fingers in a warm body fold, such as your armpit. Long-term treatment for this condition often involves making lifestyle changes and taking steps to control your exposure to cold temperature. For more severe cases, medicine (calcium channel blockers) may be used to improve blood circulation. Follow these instructions at home: Avoiding cold temperatures Take these steps to avoid exposure to cold: If possible, stay indoors during cold weather. When you go outside during cold weather, dress in layers and wear mittens, a hat, a scarf, and warm footwear. Wear mittens or gloves when handling ice or frozen food. Use holders for glasses or cans containing   cold drinks. Let warm water run for a while before taking a shower or bath. Warm up the car before driving in cold weather. Lifestyle If possible, avoid stressful and emotional situations. Try  to find ways to manage your stress, such as: Exercise. Yoga. Meditation. Biofeedback. Do not use any products that contain nicotine or tobacco. These products include cigarettes, chewing tobacco, and vaping devices, such as e-cigarettes. If you need help quitting, ask your health care provider. Avoid secondhand smoke. Limit your use of caffeine. Switch to decaffeinated coffee, tea, and soda. Avoid chocolate. Avoid vibrating tools and machinery. General instructions Protect your hands and feet from injuries, cuts, or bruises. Avoid wearing tight rings or wristbands. Wear loose fitting socks and comfortable, roomy shoes. Take over-the-counter and prescription medicines only as told by your health care provider. Where to find support Raynaud's Association: www.raynauds.org Where to find more information [employer] of Arthritis and Musculoskeletal and Skin Diseases: www.niams.SouthExposed.es Contact a health care provider if: Your discomfort becomes worse despite lifestyle changes. You develop sores on your fingers or toes that do not heal. You have breaks in the skin on your fingers or toes. You have a fever. You have pain or swelling in your joints. You have a rash. Your symptoms occur on only one side of your body. Get help right away if: Your fingers or toes turn black. You have severe pain in the affected areas. These symptoms may represent a serious problem that is an emergency. Do not wait to see if the symptoms will go away. Get medical help right away. Call your local emergency services (911 in the [place] Do not drive yourself to the hospital. Summary Raynaud's phenomenon is a condition that affects the arteries that carry blood to the fingers, toes, ears, lips, nipples, or the tip of the nose. In many cases, the cause of this condition is not known. Symptoms of this condition include changes in skin color along with numbness and tingling in the affected area. Treatment for  this condition includes lifestyle changes and reducing exposure to cold temperatures. Medicines may be used for severe cases of the condition. Contact your health care provider if your condition worsens despite treatment. This information is not intended to replace advice given to you by your health care provider. Make sure you discuss any questions you have with your health care provider. Document Revised: 10/13/2020 Document Reviewed: 10/13/2020 Elsevier Patient Education  [place].    Hand Exercises Hand exercises can be helpful for almost anyone. These exercises can strengthen the hands, improve flexibility and movement, and increase blood flow to the hands. These results can make work and daily tasks easier. Hand exercises can be especially helpful for people who have joint pain from arthritis or have nerve damage from overuse (carpal tunnel syndrome). These exercises can also help people who have injured a hand. Exercises Most of these hand exercises are gentle stretching and motion exercises. It is usually safe to do them often throughout the day. Warming up your hands before exercise may help to reduce stiffness. You can do this with gentle massage or by placing your hands in warm water for 10-15 minutes. It is normal to feel some stretching, pulling, tightness, or mild discomfort as you begin new exercises. This will gradually improve. Stop an exercise right away if you feel sudden, severe pain or your pain gets worse. Ask your health care provider which exercises are best for you. Knuckle bend or "claw" fist  Stand or sit  with your arm, hand, and all five fingers pointed straight up. Make sure to keep your wrist straight during the exercise. Gently bend your fingers down toward your palm until the tips of your fingers are touching the top of your palm. Keep your big knuckle straight and just bend the small knuckles in your fingers. Hold this position for __________  seconds. Straighten (extend) your fingers back to the starting position. Repeat this exercise 5-10 times with each hand. Full finger fist  Stand or sit with your arm, hand, and all five fingers pointed straight up. Make sure to keep your wrist straight during the exercise. Gently bend your fingers into your palm until the tips of your fingers are touching the middle of your palm. Hold this position for __________ seconds. Extend your fingers back to the starting position, stretching every joint fully. Repeat this exercise 5-10 times with each hand. Straight fist Stand or sit with your arm, hand, and all five fingers pointed straight up. Make sure to keep your wrist straight during the exercise. Gently bend your fingers at the big knuckle, where your fingers meet your hand, and the middle knuckle. Keep the knuckle at the tips of your fingers straight and try to touch the bottom of your palm. Hold this position for __________ seconds. Extend your fingers back to the starting position, stretching every joint fully. Repeat this exercise 5-10 times with each hand. Tabletop  Stand or sit with your arm, hand, and all five fingers pointed straight up. Make sure to keep your wrist straight during the exercise. Gently bend your fingers at the big knuckle, where your fingers meet your hand, as far down as you can while keeping the small knuckles in your fingers straight. Think of forming a tabletop with your fingers. Hold this position for __________ seconds. Extend your fingers back to the starting position, stretching every joint fully. Repeat this exercise 5-10 times with each hand. Finger spread  Place your hand flat on a table with your palm facing down. Make sure your wrist stays straight as you do this exercise. Spread your fingers and thumb apart from each other as far as you can until you feel a gentle stretch. Hold this position for __________ seconds. Bring your fingers and thumb tight  together again. Hold this position for __________ seconds. Repeat this exercise 5-10 times with each hand. Making circles  Stand or sit with your arm, hand, and all five fingers pointed straight up. Make sure to keep your wrist straight during the exercise. Make a circle by touching the tip of your thumb to the tip of your index finger. Hold for __________ seconds. Then open your hand wide. Repeat this motion with your thumb and each finger on your hand. Repeat this exercise 5-10 times with each hand. Thumb motion  Sit with your forearm resting on a table and your wrist straight. Your thumb should be facing up toward the ceiling. Keep your fingers relaxed as you move your thumb. Lift your thumb up as high as you can toward the ceiling. Hold for __________ seconds. Bend your thumb across your palm as far as you can, reaching the tip of your thumb for the small finger (pinkie) side of your palm. Hold for __________ seconds. Repeat this exercise 5-10 times with each hand. Grip strengthening  Hold a stress ball or other soft ball in the middle of your hand. Slowly increase the pressure, squeezing the ball as much as you can without causing pain. Think  of bringing the tips of your fingers into the middle of your palm. All of your finger joints should bend when doing this exercise. Hold your squeeze for __________ seconds, then relax. Repeat this exercise 5-10 times with each hand. Contact a health care provider if: Your hand pain or discomfort gets much worse when you do an exercise. Your hand pain or discomfort does not improve within 2 hours after you exercise. If you have any of these problems, stop doing these exercises right away. Do not do them again unless your health care provider says that you can. Get help right away if: You develop sudden, severe hand pain or swelling. If this happens, stop doing these exercises right away. Do not do them again unless your health care provider says  that you can. This information is not intended to replace advice given to you by your health care provider. Make sure you discuss any questions you have with your health care provider. Document Revised: 11/26/2020 Document Reviewed: 11/26/2020 Elsevier Patient Education  [place].

## 2022-04-11 ENCOUNTER — Other Ambulatory Visit (HOSPITAL_COMMUNITY): Payer: Self-pay

## 2022-04-12 ENCOUNTER — Other Ambulatory Visit (HOSPITAL_COMMUNITY): Payer: Self-pay

## 2022-04-12 ENCOUNTER — Other Ambulatory Visit: Payer: Self-pay | Admitting: Cardiovascular Disease

## 2022-04-12 MED ORDER — ROSUVASTATIN CALCIUM 20 MG PO TABS
20.0000 mg | ORAL_TABLET | Freq: Every day | ORAL | 1 refills | Status: DC
  Filled 2022-04-12: qty 90, 90d supply, fill #0
  Filled 2022-05-12 – 2022-07-13 (×2): qty 90, 90d supply, fill #1

## 2022-04-12 MED ORDER — OXYCODONE-ACETAMINOPHEN 5-325 MG PO TABS
1.0000 | ORAL_TABLET | Freq: Three times a day (TID) | ORAL | 0 refills | Status: DC | PRN
  Filled 2022-04-12: qty 90, 30d supply, fill #0

## 2022-04-13 ENCOUNTER — Telehealth: Payer: Self-pay | Admitting: Cardiovascular Disease

## 2022-04-13 NOTE — Telephone Encounter (Signed)
Patient called stating her dermatologist would like for her to be switched from metoprolol to amlodipine. She said she uses [facility].

## 2022-04-13 NOTE — Telephone Encounter (Signed)
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 Patient states that her dermatologist recommends Amlodipine instead. Advised patient I would forward message over to Dr. Audie Box for him to review and advise. Patient verbalized understating.

## 2022-04-14 ENCOUNTER — Other Ambulatory Visit (HOSPITAL_COMMUNITY): Payer: Self-pay

## 2022-04-14 ENCOUNTER — Other Ambulatory Visit: Payer: Self-pay

## 2022-04-14 MED ORDER — AMLODIPINE BESYLATE 5 MG PO TABS
5.0000 mg | ORAL_TABLET | Freq: Every day | ORAL | 3 refills | Status: DC
  Filled 2022-04-14: qty 90, 90d supply, fill #0
  Filled 2022-05-12 – 2022-07-13 (×2): qty 90, 90d supply, fill #1
  Filled 2022-10-06: qty 90, 90d supply, fill #2
  Filled 2023-01-06: qty 90, 90d supply, fill #3

## 2022-04-14 NOTE — Telephone Encounter (Signed)
Called patient, notified of message below. Updated RX below.  Sent into pharmacy.  Patient verbalized understanding.

## 2022-04-21 ENCOUNTER — Other Ambulatory Visit (HOSPITAL_COMMUNITY): Payer: Self-pay

## 2022-04-21 DIAGNOSIS — R202 Paresthesia of skin: Secondary | ICD-10-CM | POA: Diagnosis not present

## 2022-04-21 DIAGNOSIS — L570 Actinic keratosis: Secondary | ICD-10-CM | POA: Diagnosis not present

## 2022-04-21 DIAGNOSIS — C44319 Basal cell carcinoma of skin of other parts of face: Secondary | ICD-10-CM | POA: Diagnosis not present

## 2022-04-21 DIAGNOSIS — D225 Melanocytic nevi of trunk: Secondary | ICD-10-CM | POA: Diagnosis not present

## 2022-04-21 DIAGNOSIS — L814 Other melanin hyperpigmentation: Secondary | ICD-10-CM | POA: Diagnosis not present

## 2022-04-21 DIAGNOSIS — D485 Neoplasm of uncertain behavior of skin: Secondary | ICD-10-CM | POA: Diagnosis not present

## 2022-04-21 DIAGNOSIS — L821 Other seborrheic keratosis: Secondary | ICD-10-CM | POA: Diagnosis not present

## 2022-04-21 MED ORDER — FLUOROURACIL 5 % EX CREA
TOPICAL_CREAM | Freq: Two times a day (BID) | CUTANEOUS | 0 refills | Status: DC
  Filled 2022-04-21: qty 40, 30d supply, fill #0

## 2022-04-26 ENCOUNTER — Other Ambulatory Visit (HOSPITAL_COMMUNITY): Payer: Self-pay

## 2022-04-30 DIAGNOSIS — G4733 Obstructive sleep apnea (adult) (pediatric): Secondary | ICD-10-CM | POA: Diagnosis not present

## 2022-05-11 ENCOUNTER — Other Ambulatory Visit (HOSPITAL_COMMUNITY): Payer: Self-pay

## 2022-05-12 ENCOUNTER — Other Ambulatory Visit (HOSPITAL_COMMUNITY): Payer: Self-pay

## 2022-05-12 ENCOUNTER — Ambulatory Visit (INDEPENDENT_AMBULATORY_CARE_PROVIDER_SITE_OTHER): Payer: 59 | Admitting: Neurology

## 2022-05-12 ENCOUNTER — Other Ambulatory Visit: Payer: Self-pay

## 2022-05-12 DIAGNOSIS — G43709 Chronic migraine without aura, not intractable, without status migrainosus: Secondary | ICD-10-CM | POA: Diagnosis not present

## 2022-05-12 MED ORDER — TOPIRAMATE 50 MG PO TABS
50.0000 mg | ORAL_TABLET | Freq: Two times a day (BID) | ORAL | 3 refills | Status: DC
Start: 2022-05-12 — End: 2022-05-16
  Filled 2022-05-12: qty 180, 90d supply, fill #0

## 2022-05-12 MED ORDER — ONABOTULINUMTOXINA 200 UNITS IJ SOLR
155.0000 [IU] | Freq: Once | INTRAMUSCULAR | Status: AC
  Administered 2022-05-12: 155 [IU] via INTRAMUSCULAR

## 2022-05-12 NOTE — Progress Notes (Signed)
Consent Form Botulism Toxin Injection For Chronic Migraine  05/12/2022: She will get migrianes 2 weeks after botox when the botox has worn off. This has happened a few times, takes nurtec and then kicks in. after that she does great no more than 4 migraine days a month and no headache days. At baseline she had daily headaches prior to botox.   02/17/2022: >>90% improvement in migraine frequency.  Reviewed orally with patient, additionally signature is on file:  Botulism toxin has been approved by the Federal drug administration for treatment of chronic migraine. Botulism toxin does not cure chronic migraine and it may not be effective in some patients.  The administration of botulism toxin is accomplished by injecting a small amount of toxin into the muscles of the neck and head. Dosage must be titrated for each individual. Any benefits resulting from botulism toxin tend to wear off after 3 months with a repeat injection required if benefit is to be maintained. Injections are usually done every 3-4 months with maximum effect peak achieved by about 2 or 3 weeks. Botulism toxin is expensive and you should be sure of what costs you will incur resulting from the injection.  The side effects of botulism toxin use for chronic migraine may include:   -Transient, and usually mild, facial weakness with facial injections  -Transient, and usually mild, head or neck weakness with head/neck injections  -Reduction or loss of forehead facial animation due to forehead muscle weakness  -Eyelid drooping  -Dry eye  -Pain at the site of injection or bruising at the site of injection  -Double vision  -Potential unknown long term risks  Contraindications: You should not have Botox if you are pregnant, nursing, allergic to albumin, have an infection, skin condition, or muscle weakness at the site of the injection, or have myasthenia gravis, Lambert-Eaton syndrome, or ALS.  It is also possible that as with any  injection, there may be an allergic reaction or no effect from the medication. Reduced effectiveness after repeated injections is sometimes seen and rarely infection at the injection site may occur. All care will be taken to prevent these side effects. If therapy is given over a long time, atrophy and wasting in the muscle injected may occur. Occasionally the patient's become refractory to treatment because they develop antibodies to the toxin. In this event, therapy needs to be modified.  I have read the above information and consent to the administration of botulism toxin.    BOTOX PROCEDURE NOTE FOR MIGRAINE HEADACHE    Contraindications and precautions discussed with patient(above). Aseptic procedure was observed and patient tolerated procedure. Procedure performed by Dr. Georgia Dom  The condition has existed for more than 6 months, and pt does not have a diagnosis of ALS, Myasthenia Gravis or Lambert-Eaton Syndrome.  Risks and benefits of injections discussed and pt agrees to proceed with the procedure.  Written consent obtained  These injections are medically necessary. Pt  receives good benefits from these injections. These injections do not cause sedations or hallucinations which the oral therapies may cause.  Description of procedure:  The patient was placed in a sitting position. The standard protocol was used for Botox as follows, with 5 units of Botox injected at each site:   -Procerus muscle, midline injection  -Corrugator muscle, bilateral injection  -Frontalis muscle, bilateral injection, with 2 sites each side, medial injection was performed in the upper one third of the frontalis muscle, in the region vertical from the medial inferior edge of  the superior orbital rim. The lateral injection was again in the upper one third of the forehead vertically above the lateral limbus of the cornea, 1.5 cm lateral to the medial injection site.  -Temporalis muscle injection, 4 sites,  bilaterally. The first injection was 3 cm above the tragus of the ear, second injection site was 1.5 cm to 3 cm up from the first injection site in line with the tragus of the ear. The third injection site was 1.5-3 cm forward between the first 2 injection sites. The fourth injection site was 1.5 cm posterior to the second injection site.   -Occipitalis muscle injection, 3 sites, bilaterally. The first injection was done one half way between the occipital protuberance and the tip of the mastoid process behind the ear. The second injection site was done lateral and superior to the first, 1 fingerbreadth from the first injection. The third injection site was 1 fingerbreadth superiorly and medially from the first injection site.  -Cervical paraspinal muscle injection, 2 sites, bilateral knee first injection site was 1 cm from the midline of the cervical spine, 3 cm inferior to the lower border of the occipital protuberance. The second injection site was 1.5 cm superiorly and laterally to the first injection site.  -Trapezius muscle injection was performed at 3 sites, bilaterally. The first injection site was in the upper trapezius muscle halfway between the inflection point of the neck, and the acromion. The second injection site was one half way between the acromion and the first injection site. The third injection was done between the first injection site and the inflection point of the neck.   Will return for repeat injection in 3 months.   155 units of Botox was used, 45u Botox not injected was wasted. The patient tolerated the procedure well, there were no complications of the above procedure.

## 2022-05-12 NOTE — Progress Notes (Signed)
Botox- 200 units x 1 vial Lot: [id number] Expiration: 09/2024 NDC: 0034-9179-15  Bacteriostatic 0.9% Sodium Chloride- 86m total Lot: [id number] Expiration: 03/23/2023 NDC: [address]-01 Dx: [id number] B/B

## 2022-05-13 ENCOUNTER — Other Ambulatory Visit (HOSPITAL_COMMUNITY): Payer: Self-pay

## 2022-05-16 ENCOUNTER — Other Ambulatory Visit: Payer: Self-pay

## 2022-05-16 ENCOUNTER — Other Ambulatory Visit (HOSPITAL_COMMUNITY): Payer: Self-pay

## 2022-05-16 MED ORDER — TOPIRAMATE 50 MG PO TABS
50.0000 mg | ORAL_TABLET | Freq: Two times a day (BID) | ORAL | 3 refills | Status: DC
  Filled 2022-05-16 – 2022-07-13 (×2): qty 180, 90d supply, fill #0
  Filled 2022-10-06: qty 180, 90d supply, fill #1
  Filled 2023-02-07: qty 180, 90d supply, fill #2

## 2022-05-16 MED ORDER — OXYCODONE-ACETAMINOPHEN 5-325 MG PO TABS
1.0000 | ORAL_TABLET | Freq: Three times a day (TID) | ORAL | 0 refills | Status: DC | PRN
Start: 2022-05-16 — End: 2022-08-31
  Filled 2022-05-16: qty 90, 30d supply, fill #0

## 2022-05-18 ENCOUNTER — Other Ambulatory Visit (HOSPITAL_COMMUNITY): Payer: Self-pay

## 2022-05-18 DIAGNOSIS — M5412 Radiculopathy, cervical region: Secondary | ICD-10-CM | POA: Diagnosis not present

## 2022-05-18 DIAGNOSIS — Z6828 Body mass index (BMI) 28.0-28.9, adult: Secondary | ICD-10-CM | POA: Diagnosis not present

## 2022-05-18 DIAGNOSIS — F1120 Opioid dependence, uncomplicated: Secondary | ICD-10-CM | POA: Diagnosis not present

## 2022-05-18 DIAGNOSIS — Q761 Klippel-Feil syndrome: Secondary | ICD-10-CM | POA: Diagnosis not present

## 2022-05-18 MED ORDER — CYCLOBENZAPRINE HCL 5 MG PO TABS
ORAL_TABLET | ORAL | 2 refills | Status: DC
  Filled 2022-05-18 – 2022-07-13 (×2): qty 60, 20d supply, fill #0
  Filled 2022-08-09: qty 60, 20d supply, fill #1
  Filled 2022-10-06: qty 60, 20d supply, fill #2

## 2022-05-18 MED ORDER — OXYCODONE-ACETAMINOPHEN 5-325 MG PO TABS: ORAL_TABLET | ORAL | 0 refills | Status: DC

## 2022-05-18 MED ORDER — OXYCODONE-ACETAMINOPHEN 5-325 MG PO TABS
ORAL_TABLET | ORAL | 0 refills | Status: DC
  Filled 2022-07-13: qty 90, 30d supply, fill #0

## 2022-05-18 MED ORDER — OXYCODONE-ACETAMINOPHEN 5-325 MG PO TABS
ORAL_TABLET | ORAL | 0 refills | Status: DC
  Filled 2022-06-14: qty 90, 30d supply, fill #0

## 2022-05-24 ENCOUNTER — Telehealth: Payer: Self-pay | Admitting: Neurology

## 2022-05-24 ENCOUNTER — Ambulatory Visit (INDEPENDENT_AMBULATORY_CARE_PROVIDER_SITE_OTHER): Payer: 59 | Admitting: Medical

## 2022-05-24 VITALS — BP 122/70 | HR 73 | Temp 98.7°F | Wt 163.8 lb

## 2022-05-24 DIAGNOSIS — F17200 Nicotine dependence, unspecified, uncomplicated: Secondary | ICD-10-CM | POA: Diagnosis not present

## 2022-05-24 DIAGNOSIS — G479 Sleep disorder, unspecified: Secondary | ICD-10-CM

## 2022-05-24 DIAGNOSIS — Z6379 Other stressful life events affecting family and household: Secondary | ICD-10-CM

## 2022-05-24 DIAGNOSIS — G43709 Chronic migraine without aura, not intractable, without status migrainosus: Secondary | ICD-10-CM | POA: Diagnosis not present

## 2022-05-24 DIAGNOSIS — H02401 Unspecified ptosis of right eyelid: Secondary | ICD-10-CM

## 2022-05-24 DIAGNOSIS — H538 Other visual disturbances: Secondary | ICD-10-CM | POA: Diagnosis not present

## 2022-05-24 DIAGNOSIS — R0789 Other chest pain: Secondary | ICD-10-CM | POA: Diagnosis not present

## 2022-05-24 NOTE — Telephone Encounter (Signed)
LVM asking pt to call back to schedule appointment with either Dr. Jaynee Eagles or Amy. If pt calls back you can offer 10/4 at 1:30pm with Dr. Jaynee Eagles or 10/5 at 9:30am with Amy

## 2022-05-24 NOTE — Progress Notes (Signed)
Subjective: Chief Complaint  Patient presents with   right eye    Right eye dropping, vision- blurry, no allergies, no drainage. Side cheek looks alittle swollen, no slurred speech   Awoke this morning with droopy eyelid on right.  This morning vision was blurry.   Has improved as the day has went on.  No other numbness, tingling, weakness.  Has been under a lot of stress.   No loss of vision, no loss of hearing.    Sleep - once she gets to sleep is good.   But hasn't had a lot of sleep lately.   Has slept 5 hours last night, maybe 6 hours the night before.    Has ongoing stress with family stressors, particularly her son.   He has mental health issues and substance abuse issues, has been living in an apartment behind her home, but behaviors have been more difficult in recent months.   Getting some chest discomfort.  No SOB, no edema.     Past Medical History:  Diagnosis Date   Anal fistula    intersphincteric    Anemia    Anticardiolipin antibody positive    Cervicalgia    followed by dr Sherley Bounds   Depression    History of herpes genitalis    many yrs ago   History of kidney stones    Hyperlipidemia    Hypertension    followed by pcp   (11-25-2019  per pt had stress test approx. 2018 w/ Dr Wynonia Lawman, told normal)   Migraines    neurologist--- dr Jaynee Eagles   OSA on CPAP    TMJ (dislocation of temporomandibular joint)    11-25-2019  jaw pops, no appliance   Wears glasses    Current Outpatient Medications on File Prior to Visit  Medication Sig Dispense Refill   amLODipine (NORVASC) 5 MG tablet Take 1 tablet (5 mg total) by mouth daily. 90 tablet 3   aspirin EC 81 MG tablet Take 1 tablet (81 mg total) by mouth daily. Swallow whole. 90 tablet 3   cyclobenzaprine (FLEXERIL) 5 MG tablet take 1 tablet by mouth 3 times every day as needed for muscle spasm 60 tablet 2   fluorouracil (EFUDEX) 5 % cream Apply topically 2 (two) times daily up to 3 weeks as tolerated to affected areas. 40 g  0   ibuprofen (ADVIL) 800 MG tablet Take 800 mg by mouth every 8 (eight) hours as needed.     levonorgestrel (MIRENA) 20 MCG/24HR IUD 1 each by Intrauterine route once.     Lidocaine, Anorectal, 5 % CREA APPLY SMALL AMOUNT AS NEEDED TO RECTAL AREA 30 g 3   Multiple Vitamin (MULTIVITAMIN WITH MINERALS) TABS Take 1 tablet by mouth daily.     OnabotulinumtoxinA (BOTOX IJ) Inject as directed.     oxyCODONE-acetaminophen (PERCOCET/ROXICET) 5-325 MG tablet Take 1 tablet by mouth every 8 (eight) hours as needed for pain. 90 tablet 0   oxyCODONE-acetaminophen (PERCOCET/ROXICET) 5-325 MG tablet take 1 tablet by oral route every 8 hours as needed for pain (DNF 07/13/22) 90 tablet 0   oxyCODONE-acetaminophen (PERCOCET/ROXICET) 5-325 MG tablet take 1 tablet by oral route every 8 hours as needed for pain (DNF 06/14/22) 90 tablet 0   oxyCODONE-acetaminophen (PERCOCET/ROXICET) 5-325 MG tablet take 1 tablet by oral route every 8 hours as needed for pain (DNF 08/11/22) 90 tablet 0   phentermine (ADIPEX-P) 37.5 MG tablet Take 1 tablet (37.5 mg total) by mouth daily before breakfast. 30 tablet 2  pregabalin (LYRICA) 100 MG capsule Take 1 capsule (100 mg total) by mouth 2 (two) times daily. 180 capsule 1   rifaximin (XIFAXAN) 550 MG TABS tablet Take 1 tablet (550 mg total) by mouth 3 (three) times daily for 14 days 42 tablet 2   rosuvastatin (CRESTOR) 20 MG tablet Take 1 tablet (20 mg total) by mouth daily. 90 tablet 1   topiramate (TOPAMAX) 50 MG tablet Take 1 tablet (50 mg total) by mouth 2 (two) times daily. 180 tablet 3   Ubrogepant (UBRELVY) 100 MG TABS TAKE 1 TABLET BY MOUTH DAILY AS NEEDED, TAKE 1 TABLET BY MOUTH AT ONSET OF HEADACHE, REPEAT IN 2 HOURS IF NEEDED, NO MORE THAN 2 TABS IN 24 HOURS 8 tablet 11   valACYclovir (VALTREX) 1000 MG tablet Take 1 tablet (1,000 mg total) by mouth daily. 90 tablet 1   No current facility-administered medications on file prior to visit.    ROS as in  subjective   Objective: BP 122/70   Pulse 73   Temp 98.7 F (37.1 C)   Wt 163 lb 12.8 oz (74.3 kg)   BMI 28.12 kg/m  General appearence: alert, no distress, WD/WN,  HEENT: normocephalic, sclerae anicteric, TMs pearly, nares patent, no discharge or erythema, pharynx normal Oral cavity: MMM, no lesions Neck: supple, no lymphadenopathy, no thyromegaly, no masses Heart: RRR, normal S1, S2, no murmurs Lungs: CTA bilaterally, no wheezes, rhonchi, or rales Pulses: 2+ symmetric, upper and lower extremities, normal cap refill Neuro: subtle ptosis right upper eyelid, and difficult with snellen eye chart of right eye,   otherwise neuro unremarkable. A&Ox 3 Psych pleasant, good eye contact, answers questions appropriately   EKG reviewed   Assessment: Encounter Diagnoses  Name Primary?   Ptosis of right eyelid Yes   Blurred vision    Chest discomfort    Stressful life event affecting family    Chronic migraine without aura without status migrainosus, not intractable    Current smoker    Sleep disturbance     Plan: The ptosis is subtle on exam that she definitely seems to be current vision with the right.  I suspect this is related to stress and poor sleep.  I advise she hold off on taking phentermine for the time being as it can affect her chest pains as well as sleep.  I reviewed a lot of recent labs in chart from rheumatology.   Reviewed prior echocardiogram on file 10/2020.     Discussed possible causes of ptosis.   Strongly advised counseling.  I will reach out to neurology for their thoughts on the current symptoms and possible other eval.  Dhyana was seen today for right eye.  Diagnoses and all orders for this visit:  Ptosis of right eyelid  Blurred vision  Chest discomfort -     EKG 12-Lead  Stressful life event affecting family  Chronic migraine without aura without status migrainosus, not intractable  Current smoker  Sleep disturbance   F/u pending feedback  from neurology

## 2022-05-25 ENCOUNTER — Emergency Department (HOSPITAL_COMMUNITY)
Admission: EM | Admit: 2022-05-25 | Discharge: 2022-05-26 | Disposition: A | Discharge status: Home / Self Care | Payer: 59 | Attending: Emergency Medicine | Admitting: Emergency Medicine

## 2022-05-25 ENCOUNTER — Encounter (HOSPITAL_COMMUNITY): Payer: Self-pay | Admitting: Emergency Medicine

## 2022-05-25 ENCOUNTER — Emergency Department (HOSPITAL_COMMUNITY): Payer: 59

## 2022-05-25 ENCOUNTER — Other Ambulatory Visit (HOSPITAL_COMMUNITY): Payer: Self-pay

## 2022-05-25 ENCOUNTER — Ambulatory Visit (INDEPENDENT_AMBULATORY_CARE_PROVIDER_SITE_OTHER): Payer: 59 | Admitting: Neurology

## 2022-05-25 ENCOUNTER — Telehealth: Payer: Self-pay | Admitting: Neurology

## 2022-05-25 ENCOUNTER — Encounter: Payer: Self-pay | Admitting: Neurology

## 2022-05-25 VITALS — BP 141/94 | HR 69 | Ht 64.0 in | Wt 163.6 lb

## 2022-05-25 DIAGNOSIS — R519 Headache, unspecified: Secondary | ICD-10-CM

## 2022-05-25 DIAGNOSIS — M5031 Other cervical disc degeneration,  high cervical region: Secondary | ICD-10-CM | POA: Diagnosis not present

## 2022-05-25 DIAGNOSIS — H5460 Unqualified visual loss, one eye, unspecified: Secondary | ICD-10-CM

## 2022-05-25 DIAGNOSIS — H02401 Unspecified ptosis of right eyelid: Secondary | ICD-10-CM

## 2022-05-25 DIAGNOSIS — H469 Unspecified optic neuritis: Secondary | ICD-10-CM | POA: Diagnosis not present

## 2022-05-25 DIAGNOSIS — R29818 Other symptoms and signs involving the nervous system: Secondary | ICD-10-CM | POA: Diagnosis not present

## 2022-05-25 DIAGNOSIS — H538 Other visual disturbances: Secondary | ICD-10-CM | POA: Insufficient documentation

## 2022-05-25 DIAGNOSIS — Z7982 Long term (current) use of aspirin: Secondary | ICD-10-CM | POA: Diagnosis not present

## 2022-05-25 DIAGNOSIS — H5461 Unqualified visual loss, right eye, normal vision left eye: Secondary | ICD-10-CM

## 2022-05-25 DIAGNOSIS — H53131 Sudden visual loss, right eye: Secondary | ICD-10-CM | POA: Diagnosis not present

## 2022-05-25 DIAGNOSIS — H02421 Myogenic ptosis of right eyelid: Secondary | ICD-10-CM | POA: Diagnosis not present

## 2022-05-25 DIAGNOSIS — H532 Diplopia: Secondary | ICD-10-CM | POA: Diagnosis not present

## 2022-05-25 LAB — BASIC METABOLIC PANEL
Anion gap: 7 (ref 5–15)
BUN: 11 mg/dL (ref 6–20)
CO2: 24 mmol/L (ref 22–32)
Calcium: 9.1 mg/dL (ref 8.9–10.3)
Chloride: 107 mmol/L (ref 98–111)
Creatinine, Ser: 0.74 mg/dL (ref 0.44–1.00)
GFR, Estimated: >60 mL/min (ref >60)
Glucose, Bld: 158 mg/dL — ABNORMAL HIGH (ref 70–99)
Potassium: 3.9 mmol/L (ref 3.5–5.1)
Sodium: 138 mmol/L (ref 135–145)

## 2022-05-25 LAB — CBC WITH DIFFERENTIAL/PLATELET
Abs Immature Granulocytes: 0.01 10*3/uL (ref 0.00–0.07)
Basophils Absolute: 0.1 10*3/uL (ref 0.0–0.1)
Basophils Relative: 1 %
Eosinophils Absolute: 0.1 10*3/uL (ref 0.0–0.5)
Eosinophils Relative: 1 %
HCT: 36.5 % (ref 36.0–46.0)
Hemoglobin: 12.5 g/dL (ref 12.0–15.0)
Immature Granulocytes: 0 %
Lymphocytes Relative: 30 %
Lymphs Abs: 2.4 10*3/uL (ref 0.7–4.0)
MCH: 32.2 pg (ref 26.0–34.0)
MCHC: 34.2 g/dL (ref 30.0–36.0)
MCV: 94.1 fL (ref 80.0–100.0)
Monocytes Absolute: 0.4 10*3/uL (ref 0.1–1.0)
Monocytes Relative: 5 %
Neutro Abs: 5.1 10*3/uL (ref 1.7–7.7)
Neutrophils Relative %: 63 %
Platelets: 253 10*3/uL (ref 150–400)
RBC: 3.88 MIL/uL (ref 3.87–5.11)
RDW: 12.6 % (ref 11.5–15.5)
WBC: 8 10*3/uL (ref 4.0–10.5)
nRBC: 0 % (ref 0.0–0.2)

## 2022-05-25 LAB — PROTIME-INR
INR: 1 (ref 0.8–1.2)
Prothrombin Time: 13.2 seconds (ref 11.4–15.2)

## 2022-05-25 LAB — SEDIMENTATION RATE: Sed Rate: 8 mm/hr (ref 0–22)

## 2022-05-25 MED ORDER — IOHEXOL 350 MG/ML SOLN
75.0000 mL | Freq: Once | INTRAVENOUS | Status: AC | PRN
  Administered 2022-05-25: 75 mL via INTRAVENOUS

## 2022-05-25 MED ORDER — PREDNISONE 50 MG PO TABS
ORAL_TABLET | ORAL | 0 refills | Status: DC
  Filled 2022-05-25: qty 3, 3d supply, fill #0

## 2022-05-25 MED ORDER — GADOBUTROL 1 MMOL/ML IV SOLN
7.0000 mL | Freq: Once | INTRAVENOUS | Status: AC | PRN
  Administered 2022-05-25: 7 mL via INTRAVENOUS

## 2022-05-25 NOTE — Patient Instructions (Signed)
Prednisone Tablets What is this medication? PREDNISONE (PRED ni sone) treats many conditions such as asthma, allergic reactions, arthritis, inflammatory bowel diseases, adrenal, and blood or bone marrow disorders. It works by decreasing inflammation, slowing down an overactive immune system, or replacing cortisol normally made in the body. Cortisol is a hormone that plays an important role in how the body responds to stress, illness, and injury. It belongs to a group of medications called steroids. This medicine may be used for other purposes; ask your health care provider or pharmacist if you have questions. COMMON BRAND NAME(S): Deltasone, Predone, Sterapred, Sterapred DS What should I tell my care team before I take this medication? They need to know if you have any of these conditions: Cushing's syndrome Diabetes Glaucoma Heart disease High blood pressure Infection (especially a virus infection such as chickenpox, cold sores, or herpes) Kidney disease Liver disease Mental illness Myasthenia gravis Osteoporosis Seizures Stomach or intestine problems Thyroid disease An unusual or allergic reaction to lactose, prednisone, other medications, foods, dyes, or preservatives Pregnant or trying to get pregnant Breast-feeding How should I use this medication? Take this medication by mouth with a glass of water. Follow the directions on the prescription label. Take this medication with food. If you are taking this medication once a day, take it in the morning. Do not take more medication than you are told to take. Do not suddenly stop taking your medication because you may develop a severe reaction. Your care team will tell you how much medication to take. If your care team wants you to stop the medication, the dose may be slowly lowered over time to avoid any side effects. Talk to your care team about the use of this medication in children. Special care may be needed. Overdosage: If you think  you have taken too much of this medicine contact a poison control center or emergency room at once. NOTE: This medicine is only for you. Do not share this medicine with others. What if I miss a dose? If you miss a dose, take it as soon as you can. If it is almost time for your next dose, talk to your care team. You may need to miss a dose or take an extra dose. Do not take double or extra doses without advice. What may interact with this medication? Do not take this medication with any of the following: Metyrapone Mifepristone This medication may also interact with the following: Aminoglutethimide Amphotericin B Aspirin and aspirin-like medications Barbiturates Certain medications for diabetes, like glipizide or glyburide Cholestyramine Cholinesterase inhibitors Cyclosporine Digoxin Diuretics Ephedrine Female hormones, like estrogens and birth control pills Isoniazid Ketoconazole NSAIDS, medications for pain and inflammation, like ibuprofen or naproxen Phenytoin Rifampin Toxoids Vaccines Warfarin This list may not describe all possible interactions. Give your health care provider a list of all the medicines, herbs, non-prescription drugs, or dietary supplements you use. Also tell them if you smoke, drink alcohol, or use illegal drugs. Some items may interact with your medicine. What should I watch for while using this medication? Visit your care team for regular checks on your progress. If you are taking this medication over a prolonged period, carry an identification card with your name and address, the type and dose of your medication, and your care team's name and address. This medication may increase your risk of getting an infection. Tell your care team if you are around anyone with measles or chickenpox, or if you develop sores or blisters that do not heal properly.   If you are going to have surgery, tell your care team that you have taken this medication within the last twelve  months. Ask your care team about your diet. You may need to lower the amount of salt you eat. This medication may increase blood sugar. Ask your care team if changes in diet or medications are needed if you have diabetes. What side effects may I notice from receiving this medication? Side effects that you should report to your care team as soon as possible: Allergic reactions--skin rash, itching, hives, swelling of the face, lips, tongue, or throat Cushing syndrome--increased fat around the midsection, upper back, neck, or face, pink or purple stretch marks on the skin, thinning, fragile skin that easily bruises, unexpected hair growth High blood sugar (hyperglycemia)--increased thirst or amount of urine, unusual weakness or fatigue, blurry vision Increase in blood pressure Infection--fever, chills, cough, sore throat, wounds that don't heal, pain or trouble when passing urine, general feeling of discomfort or being unwell Low adrenal gland function--nausea, vomiting, loss of appetite, unusual weakness or fatigue, dizziness Mood and behavior changes--anxiety, nervousness, confusion, hallucinations, irritability, hostility, thoughts of suicide or self-harm, worsening mood, feelings of depression Stomach bleeding--bloody or black, tar-like stools, vomiting blood or brown material that looks like coffee grounds Swelling of the ankles, hands, or feet Side effects that usually do not require medical attention (report to your care team if they continue or are bothersome): Acne General discomfort and fatigue Headache Increase in appetite Nausea Trouble sleeping Weight gain This list may not describe all possible side effects. Call your doctor for medical advice about side effects. You may report side effects to FDA at 1-800-FDA-1088. Where should I keep my medication? Keep out of the reach of children. Store at room temperature between 15 and 30 degrees C (59 and 86 degrees F). Protect from light.  Keep container tightly closed. Throw away any unused medication after the expiration date. NOTE: This sheet is a summary. It may not cover all possible information. If you have questions about this medicine, talk to your doctor, pharmacist, or health care provider.  2023 Elsevier/Gold Standard (2020-10-21 00:00:00)

## 2022-05-25 NOTE — Telephone Encounter (Addendum)
Spoke to Triage RN at [facility] ED   informed RN patient is coming now to [facility] ED due to right vision  worsening. [Per Dr Jaynee Eagles Possible Retrobulbar and imaging stat and possible neurology input . RN expressed understanding

## 2022-05-25 NOTE — ED Provider Triage Note (Signed)
Emergency Medicine Provider Triage Evaluation Note  Kelly [name] , [name] 56 y.o. female  was evaluated in triage.  Pt complains of right eye blurred vision and right eye drooping.  Was seen by [facility] neurology, currently sent to ophthalmology.  They state her symptoms are likely retrobulbar.  No numbness or weakness to arms or legs.  No slurred speech.  They recommended further labs and imaging.  Review of Systems  Positive: Right eye blurred vision, right eye droop Negative:   Physical Exam  BP (!) 147/89 (BP Location: Right Arm)   Pulse 78   Temp 98.7 F (37.1 C) (Oral)   Resp 18   SpO2 100%  Gen:   Awake, no distress   Resp:  Normal effort  MSK:   Moves extremities without difficulty  Neuro:  Equal handgrip, intact sensation, smile symmetric  Other:    Medical Decision Making  Medically screening exam initiated at 4:15 PM.  Appropriate orders placed.  Kelly [name] was informed that the remainder of the evaluation will be completed by another provider, this initial triage assessment does not replace that evaluation, and the importance of remaining in the ED until their evaluation is complete.  Right eye vision changes, right eye droop   Kelly Vanstone A, PA-C 05/25/22 1624

## 2022-05-25 NOTE — Telephone Encounter (Signed)
I see the ED PA has ordered MRI brain, orbits, and then CT angio head and neck.

## 2022-05-25 NOTE — ED Triage Notes (Signed)
Patient here with complaint of blurred vision that started approximately twenty four hours ago. Seen by ophthalmologist and referred to ED for further evaluation.

## 2022-05-25 NOTE — Telephone Encounter (Signed)
05/25/2022 Received a call from ophthalmology and patient's right eye vision has acutely worsened to 20/60 from normal however the eye itself looks fine the problem is likely retrobulbar and so she has to go to the ED to get imaging stat and possibly neurology input. She is heading to [facility] we will call the ED and let them know.  Can you call triage and let them know please? Thank you
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
## 2022-05-25 NOTE — Telephone Encounter (Signed)
Pt called back. Stated she wanted the appointment with Dr. Jaynee Eagles. Offered Pt 10/4 @ 9am. Pt accepted and said she was on the way.

## 2022-05-26 NOTE — ED Provider Notes (Signed)
[facility] EMERGENCY DEPARTMENT Provider Note   CSN: [id number] Arrival date & time: 05/25/22  1546     History  Chief Complaint  Patient presents with   Blurred Vision    Kelly [name] is a 56 y.o. female.  56 yo F with a chief complaints of change in vision to her right eye.  This was noted acutely and she was seen by her neurologist.  She had a MRI performed without contrast that was negative and then was referred to an ophthalmologist.  They do not appreciate any obvious eye pathology and she was sent here for emergent imaging and inflammatory marker evaluation.  Patient was started on steroids for possible temporal neuritis, she has had some improvement of her vision since.  Denies trauma.  She has had some decrease sensation about the right side of her face adjacent to the cheek.  Denies any other one-sided numbness or weakness denies difficulty speech or swallowing.  She denies cough congestion denies weight loss denies night sweats.        Home Medications Prior to Admission medications   Medication Sig Start Date End Date Taking? Authorizing Provider  amLODipine (NORVASC) 5 MG tablet Take 1 tablet (5 mg total) by mouth daily. 04/14/22   Geralynn Rile, MD  aspirin EC 81 MG tablet Take 1 tablet (81 mg total) by mouth daily. Swallow whole. 07/09/20   O'Neal, Cassie Freer, MD  cyclobenzaprine (FLEXERIL) 5 MG tablet take 1 tablet by mouth 3 times every day as needed for muscle spasm 05/18/22     fluorouracil (EFUDEX) 5 % cream Apply topically 2 (two) times daily up to 3 weeks as tolerated to affected areas. 04/21/22     ibuprofen (ADVIL) 800 MG tablet Take 800 mg by mouth every 8 (eight) hours as needed.    [provider]  levonorgestrel (MIRENA) 20 MCG/24HR IUD 1 each by Intrauterine route once.    [provider]  Lidocaine, Anorectal, 5 % CREA APPLY SMALL AMOUNT AS NEEDED TO RECTAL AREA 05/22/19   [name], MD  Multiple  Vitamin (MULTIVITAMIN WITH MINERALS) TABS Take 1 tablet by mouth daily.    [provider]  OnabotulinumtoxinA (BOTOX IJ) Inject as directed.    [provider]  oxyCODONE-acetaminophen (PERCOCET/ROXICET) 5-325 MG tablet Take 1 tablet by mouth every 8 (eight) hours as needed for pain. 05/16/22     oxyCODONE-acetaminophen (PERCOCET/ROXICET) 5-325 MG tablet take 1 tablet by oral route every 8 hours as needed for pain (DNF 07/13/22) 05/18/22     oxyCODONE-acetaminophen (PERCOCET/ROXICET) 5-325 MG tablet take 1 tablet by oral route every 8 hours as needed for pain (DNF 06/14/22) 05/18/22     oxyCODONE-acetaminophen (PERCOCET/ROXICET) 5-325 MG tablet take 1 tablet by oral route every 8 hours as needed for pain (DNF 08/11/22) 05/18/22     phentermine (ADIPEX-P) 37.5 MG tablet Take 1 tablet (37.5 mg total) by mouth daily before breakfast. 02/24/22   [name], MD  predniSONE (DELTASONE) 50 MG tablet Take 1 tablet immediately and then daily in the morning with food and we will call with results 05/25/22   Melvenia Beam, MD  pregabalin (LYRICA) 100 MG capsule Take 1 capsule (100 mg total) by mouth 2 (two) times daily. 02/24/22     rifaximin (XIFAXAN) 550 MG TABS tablet Take 1 tablet (550 mg total) by mouth 3 (three) times daily for 14 days 01/18/22     rosuvastatin (CRESTOR) 20 MG tablet Take 1 tablet (20  mg total) by mouth daily. 04/12/22   Geralynn Rile, MD  topiramate (TOPAMAX) 50 MG tablet Take 1 tablet (50 mg total) by mouth 2 (two) times daily. 05/16/22   Lomax, Amy, NP  Ubrogepant (UBRELVY) 100 MG TABS TAKE 1 TABLET BY MOUTH DAILY AS NEEDED, TAKE 1 TABLET BY MOUTH AT ONSET OF HEADACHE, REPEAT IN 2 HOURS IF NEEDED, NO MORE THAN 2 TABS IN 24 HOURS 09/21/21 09/21/22  Lomax, Amy, NP  valACYclovir (VALTREX) 1000 MG tablet Take 1 tablet (1,000 mg total) by mouth daily. 03/17/22   Tamela Gammon, NP      Allergies    Imitrex [sumatriptan base], Zomig, Avelox [moxifloxacin hcl in  nacl], Morphine, Septra [bactrim], Sulfamethoxazole, Sumatriptan, and Trimethoprim    Review of Systems   Review of Systems  Physical Exam Updated Vital Signs BP 132/88 (BP Location: Left Arm)   Pulse 75   Temp 98 F (36.7 C) (Oral)   Resp 19   SpO2 96%  Physical Exam Vitals and nursing note reviewed.  Constitutional:      General: She is not in acute distress.    Appearance: She is well-developed. She is not diaphoretic.  HENT:     Head: Normocephalic and atraumatic.  Eyes:     Pupils: Pupils are equal, round, and reactive to light.  Cardiovascular:     Rate and Rhythm: Normal rate and regular rhythm.     Heart sounds: No murmur heard.    No friction rub. No gallop.  Pulmonary:     Effort: Pulmonary effort is normal.     Breath sounds: No wheezing or rales.  Abdominal:     General: There is no distension.     Palpations: Abdomen is soft.     Tenderness: There is no abdominal tenderness.  Musculoskeletal:        General: No tenderness.     Cervical back: Normal range of motion and neck supple.  Skin:    General: Skin is warm and dry.  Neurological:     Mental Status: She is alert and oriented to person, place, and time.     Sensory: Sensation is intact.     Motor: Motor function is intact.     Coordination: Coordination is intact.     Gait: Gait is intact.     Comments: Some lid lag with going from inferior extraocular motion to superior, otherwise benign neurologic exam  Psychiatric:        Behavior: Behavior normal.     ED Results / Procedures / Treatments   Labs (all labs ordered are listed, but only abnormal results are displayed) Labs Reviewed  BASIC METABOLIC PANEL - Abnormal; Notable for the following components:      Result Value   Glucose, Bld 158 (*)    All other components within normal limits  CBC WITH DIFFERENTIAL/PLATELET  PROTIME-INR  SEDIMENTATION RATE  C-REACTIVE PROTEIN    EKG None  Radiology CT ANGIO HEAD NECK W WO CM  Result  Date: 05/25/2022 CLINICAL DATA:  Neuro deficit, acute, stroke suspected. Blurred vision. EXAM: CT ANGIOGRAPHY HEAD AND NECK TECHNIQUE: Multidetector CT imaging of the head and neck was performed using the standard protocol during bolus administration of intravenous contrast. Multiplanar CT image reconstructions and MIPs were obtained to evaluate the vascular anatomy. Carotid stenosis measurements (when applicable) are obtained utilizing NASCET criteria, using the distal internal carotid diameter as the denominator. RADIATION DOSE REDUCTION: This exam was performed according to the departmental dose-optimization program  which includes automated exposure control, adjustment of the mA and/or kV according to patient size and/or use of iterative reconstruction technique. CONTRAST:  7m OMNIPAQUE IOHEXOL 350 MG/ML SOLN COMPARISON:  MRI head and orbits 05/25/2022. FINDINGS: CT HEAD FINDINGS Brain: There is no evidence of an acute infarct, intracranial hemorrhage, mass, midline shift, or extra-axial fluid collection. The ventricles and sulci are normal. Vascular: No hyperdense vessel. Skull: No fracture or suspicious osseous lesion. Sinuses/Orbits: Mild mucosal thickening in the right maxillary sinus. Clear mastoid air cells. Unremarkable orbits. Other: None. Review of the MIP images confirms the above findings CTA NECK FINDINGS Aortic arch: Normal variant aortic arch branching pattern with the left vertebral artery arising directly from the arch. Widely patent arch vessel origins. Right carotid system: Patent without evidence of stenosis or dissection. Left carotid system: Patent without evidence of stenosis or dissection. Vertebral arteries: Patent without evidence of stenosis or dissection. Slightly dominant right vertebral artery. Skeleton: C4-C6 ACDF.  Moderate disc degeneration at C3-4. Other neck: No evidence of cervical lymphadenopathy or mass. Upper chest: Clear lung apices. Review of the MIP images confirms the  above findings CTA HEAD FINDINGS Anterior circulation: The internal carotid arteries are widely patent from skull base to carotid termini. ACAs and MCAs are patent without evidence of a proximal branch occlusion or significant proximal stenosis. No aneurysm is identified. Posterior circulation: The intracranial vertebral arteries are widely patent to the basilar. The left PICA and right AICA appear dominant. Patent SCA origins are seen bilaterally. The basilar artery is widely patent. There are moderately large posterior communicating arteries bilaterally. Both PCAs are patent without evidence of a significant proximal stenosis. No aneurysm is identified. Venous sinuses: Patent. Anatomic variants: None. Review of the MIP images confirms the above findings IMPRESSION: No large vessel occlusion, significant stenosis, or aneurysm in the head and neck. Electronically Signed   By: ALogan BoresM.D.   On: 05/25/2022 21:13   MR Brain W and Wo Contrast  Result Date: 05/25/2022 CLINICAL DATA:  Right eye blurred vision.  Double vision. EXAM: MRI HEAD AND ORBITS WITHOUT AND WITH CONTRAST TECHNIQUE: Multiplanar, multiecho pulse sequences of the brain and surrounding structures were obtained without and with intravenous contrast. Multiplanar, multiecho pulse sequences of the orbits and surrounding structures were obtained including fat saturation techniques, before and after intravenous contrast administration. CONTRAST:  745mGADAVIST GADOBUTROL 1 MMOL/ML IV SOLN COMPARISON:  Brain MRI 04/21/2020 FINDINGS: MRI HEAD FINDINGS Brain: No acute infarction, hemorrhage, hydrocephalus, extra-axial collection or mass lesion. Few remote white matter insults without specific pattern or progression from 2021. No brain atrophy. No abnormal enhancement. Vascular: Normal flow voids. Skull and upper cervical spine: Normal marrow signal. MRI ORBITS FINDINGS Orbits: No traumatic or inflammatory finding. Globes, optic nerves, orbital fat,  extraocular muscles, vascular structures, and lacrimal glands are normal. Visualized sinuses: Mild mucosal thickening or retention cyst along the floor of the right maxillary sinus. Soft tissues: Negative IMPRESSION: Brain MRI: No acute finding or explanation for symptoms.  No change from 2021. Orbit MRI: Negative. Electronically Signed   By: JoJorje Guild.D.   On: 05/25/2022 19:12   MR ORBITS W WO CONTRAST  Result Date: 05/25/2022 CLINICAL DATA:  Right eye blurred vision.  Double vision. EXAM: MRI HEAD AND ORBITS WITHOUT AND WITH CONTRAST TECHNIQUE: Multiplanar, multiecho pulse sequences of the brain and surrounding structures were obtained without and with intravenous contrast. Multiplanar, multiecho pulse sequences of the orbits and surrounding structures were obtained including fat saturation techniques, before  and after intravenous contrast administration. CONTRAST:  36m GADAVIST GADOBUTROL 1 MMOL/ML IV SOLN COMPARISON:  Brain MRI 04/21/2020 FINDINGS: MRI HEAD FINDINGS Brain: No acute infarction, hemorrhage, hydrocephalus, extra-axial collection or mass lesion. Few remote white matter insults without specific pattern or progression from 2021. No brain atrophy. No abnormal enhancement. Vascular: Normal flow voids. Skull and upper cervical spine: Normal marrow signal. MRI ORBITS FINDINGS Orbits: No traumatic or inflammatory finding. Globes, optic nerves, orbital fat, extraocular muscles, vascular structures, and lacrimal glands are normal. Visualized sinuses: Mild mucosal thickening or retention cyst along the floor of the right maxillary sinus. Soft tissues: Negative IMPRESSION: Brain MRI: No acute finding or explanation for symptoms.  No change from 2021. Orbit MRI: Negative. Electronically Signed   By: JJorje GuildM.D.   On: 05/25/2022 19:12    Procedures Procedures    Medications Ordered in ED Medications  gadobutrol (GADAVIST) 1 MMOL/ML injection 7 mL (7 mLs Intravenous Contrast Given  05/25/22 1817)  iohexol (OMNIPAQUE) 350 MG/ML injection 75 mL (75 mLs Intravenous Contrast Given 05/25/22 2042)    ED Course/ Medical Decision Making/ A&P                           Medical Decision Making  56yo F with a chief complaints of acute right-sided vision change.  She has been seen as an outpatient by both the neurologist and ophthalmologist.  They together had sent her to the emergency department for emergent MRI of the brain and orbits with and without contrast.  Likely these were both negative.  Patient's symptoms also have improved with oral steroids.  I discussed the case with Dr. ARory Percy neurology he independently interpreted the patient's imaging.  Plan for her to follow-up with her outpatient neurologist in the office.  10:01 AM:  I have discussed the diagnosis/risks/treatment options with the patient and family.  Evaluation and diagnostic testing in the emergency department does not suggest an emergent condition requiring admission or immediate intervention beyond what has been performed at this time.  They will follow up with Neuro. We also discussed returning to the ED immediately if new or worsening sx occur. We discussed the sx which are most concerning (e.g., sudden worsening pain, fever, inability to tolerate by mouth, stroke s/sx) that necessitate immediate return. Medications administered to the patient during their visit and any new prescriptions provided to the patient are listed below.  Medications given during this visit Medications  gadobutrol (GADAVIST) 1 MMOL/ML injection 7 mL (7 mLs Intravenous Contrast Given 05/25/22 1817)  iohexol (OMNIPAQUE) 350 MG/ML injection 75 mL (75 mLs Intravenous Contrast Given 05/25/22 2042)     The patient appears reasonably screen and/or stabilized for discharge and I doubt any other medical condition or other [facility] further screening, evaluation, or treatment in the ED at this time prior to discharge.          Final  Clinical Impression(s) / ED Diagnoses Final diagnoses:  Blurry vision    Rx / DC Orders ED Discharge Orders     None         FDeno Etienne DO 05/26/22 1001

## 2022-05-26 NOTE — Discharge Instructions (Signed)
Follow up with your neurologist.  Return for worsening symptoms.   

## 2022-05-30 ENCOUNTER — Encounter: Payer: Self-pay | Admitting: Neurology

## 2022-05-30 DIAGNOSIS — G4733 Obstructive sleep apnea (adult) (pediatric): Secondary | ICD-10-CM | POA: Diagnosis not present

## 2022-06-02 ENCOUNTER — Encounter: Payer: Self-pay | Admitting: Adult Health

## 2022-06-02 ENCOUNTER — Encounter: Payer: Self-pay | Admitting: Neurology

## 2022-06-02 NOTE — Telephone Encounter (Signed)
LVM asking pt to cb and schedule f/u with Amy or Dr. Jaynee Eagles. Held slot with Amy on 10/16 at 8:30 am.

## 2022-06-06 NOTE — Telephone Encounter (Signed)
Pt called.  Scheduled a f/u visit with Amy on 10/19 @ 8am.
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
## 2022-06-07 NOTE — Patient Instructions (Incomplete)
Below is our plan:  We will continue Botox, topiramate and Ubrelvy. I have given you a shot of Toradol today. Avoid any other Nsaids for 24 hours. Try Zavzpret nasal spray for abortive therapy.   I was able to review your CPAP download showing sub optimal usage. Your apnea was mild, overall, but when you have pauses, your oxygen level drops to 78%. This can cause significant concerns with headaches, fatigue, word finding trouble, etc. Please continue working on meeting at least 70% compliance. I recommend daily usage for at least 4 hours.   Please make sure you are staying well hydrated. I recommend 50-60 ounces daily. Well balanced diet and regular exercise encouraged. Consistent sleep schedule with 6-8 hours recommended.   Please continue follow up with care team as directed.   Follow up with Dr Jaynee Eagles in January as scheduled   You may receive a survey regarding today's visit. I encourage you to leave honest feed back as I do use this information to improve patient care. Thank you for seeing me today!

## 2022-06-08 LAB — BASIC METABOLIC PANEL
BUN/Creatinine Ratio: 13 (ref 9–23)
BUN: 11 mg/dL (ref 6–24)
CO2: 22 mmol/L (ref 20–29)
Calcium: 9.4 mg/dL (ref 8.7–10.2)
Chloride: 105 mmol/L (ref 96–106)
Creatinine, Ser: 0.86 mg/dL (ref 0.57–1.00)
Glucose: 82 mg/dL (ref 70–99)
Potassium: 4.3 mmol/L (ref 3.5–5.2)
Sodium: 141 mmol/L (ref 134–144)
eGFR: 80 mL/min/{1.73_m2} (ref >59)

## 2022-06-08 LAB — MUSK ANTIBODIES: MuSK Antibodies: <1 U/mL

## 2022-06-08 LAB — SEDIMENTATION RATE: Sed Rate: 4 mm/hr (ref 0–40)

## 2022-06-08 LAB — MYASTHENIA GRAVIS PROFILE
AChR Binding Ab, Serum: <0.03 nmol/L (ref 0.00–0.24)
AChR-modulating Ab: 0 % (ref 0–45)
Acetylchol Block Ab: 18 % (ref 0–25)
Anti-striation Abs: NEGATIVE

## 2022-06-08 LAB — C-REACTIVE PROTEIN: CRP: <1 mg/L (ref 0–10)

## 2022-06-09 ENCOUNTER — Ambulatory Visit (INDEPENDENT_AMBULATORY_CARE_PROVIDER_SITE_OTHER): Payer: 59 | Admitting: Family Medicine

## 2022-06-09 ENCOUNTER — Encounter: Payer: Self-pay | Admitting: Family Medicine

## 2022-06-09 VITALS — BP 122/78 | HR 78 | Ht 64.0 in | Wt 161.0 lb

## 2022-06-09 DIAGNOSIS — H02401 Unspecified ptosis of right eyelid: Secondary | ICD-10-CM | POA: Diagnosis not present

## 2022-06-09 DIAGNOSIS — G4733 Obstructive sleep apnea (adult) (pediatric): Secondary | ICD-10-CM

## 2022-06-09 DIAGNOSIS — H539 Unspecified visual disturbance: Secondary | ICD-10-CM | POA: Diagnosis not present

## 2022-06-09 DIAGNOSIS — R202 Paresthesia of skin: Secondary | ICD-10-CM

## 2022-06-09 DIAGNOSIS — M542 Cervicalgia: Secondary | ICD-10-CM

## 2022-06-09 DIAGNOSIS — W19XXXA Unspecified fall, initial encounter: Secondary | ICD-10-CM | POA: Diagnosis not present

## 2022-06-09 DIAGNOSIS — G43709 Chronic migraine without aura, not intractable, without status migrainosus: Secondary | ICD-10-CM

## 2022-06-09 MED ORDER — KETOROLAC TROMETHAMINE 60 MG/2ML IM SOLN
60.0000 mg | Freq: Once | INTRAMUSCULAR | Status: AC
  Administered 2022-06-09: 60 mg via INTRAMUSCULAR

## 2022-06-10 ENCOUNTER — Telehealth: Payer: Self-pay | Admitting: Family Medicine

## 2022-06-10 NOTE — Telephone Encounter (Signed)
[facility] Merrilyn Puma: [phone] exp. 06/10/22-08/08/22 sent to [place]

## 2022-06-14 ENCOUNTER — Other Ambulatory Visit (HOSPITAL_COMMUNITY): Payer: Self-pay

## 2022-06-15 ENCOUNTER — Telehealth (INDEPENDENT_AMBULATORY_CARE_PROVIDER_SITE_OTHER): Payer: 59 | Admitting: Medical

## 2022-06-15 ENCOUNTER — Other Ambulatory Visit (HOSPITAL_COMMUNITY): Payer: Self-pay

## 2022-06-15 ENCOUNTER — Other Ambulatory Visit: Payer: Self-pay | Admitting: Medical

## 2022-06-15 DIAGNOSIS — N390 Urinary tract infection, site not specified: Secondary | ICD-10-CM | POA: Diagnosis not present

## 2022-06-15 LAB — POCT URINALYSIS DIP (PROADVANTAGE DEVICE)
Bilirubin, UA: NEGATIVE
Glucose, UA: NEGATIVE mg/dL
Ketones, POC UA: NEGATIVE mg/dL
Nitrite, UA: NEGATIVE
Protein Ur, POC: 30 mg/dL — AB
Specific Gravity, Urine: 1.02
Urobilinogen, Ur: NEGATIVE
pH, UA: 6 (ref 5.0–8.0)

## 2022-06-15 MED ORDER — NITROFURANTOIN MONOHYD MACRO 100 MG PO CAPS
100.0000 mg | ORAL_CAPSULE | Freq: Two times a day (BID) | ORAL | 0 refills | Status: DC
  Filled 2022-06-15: qty 14, 7d supply, fill #0

## 2022-06-15 NOTE — Progress Notes (Signed)
Pt was notified and never followed up as her symptoms cleared up

## 2022-06-15 NOTE — Progress Notes (Unsigned)
Antibiotic sent to pharmacy for suspected UTI, new symptoms of burning, hematuria.    Urinalysis in house abnormal today.    Urology notes from 7/23 advised f/u in 6 weeks and trial of medication vesicare.  Confirm she went for 6 week f/u and get notes from that visit.

## 2022-06-15 NOTE — Progress Notes (Signed)
Pt.notified

## 2022-06-16 NOTE — Progress Notes (Signed)
Subjective:   Kelly [name] is a 56 y.o. female who complains of possible urinary tract infection.  2 days of clots in the urine, dysuria, burning with urination.  Saw urology recently for evaluation for recurrent UTI but no specific problem found. No other aggravating or relieving factors.  No other c/o.  Past Medical History:  Diagnosis Date   Anal fistula    intersphincteric    Anemia    Anticardiolipin antibody positive    Cervicalgia    followed by dr Sherley Bounds   Depression    History of herpes genitalis    many yrs ago   History of kidney stones    Hyperlipidemia    Hypertension    followed by pcp   (11-25-2019  per pt had stress test approx. 2018 w/ Dr Wynonia Lawman, told normal)   Migraines    neurologist--- dr Jaynee Eagles   OSA on CPAP    TMJ (dislocation of temporomandibular joint)    11-25-2019  jaw pops, no appliance   Wears glasses     Current Outpatient Medications on File Prior to Visit  Medication Sig Dispense Refill   amLODipine (NORVASC) 5 MG tablet Take 1 tablet (5 mg total) by mouth daily. 90 tablet 3   aspirin EC 81 MG tablet Take 1 tablet (81 mg total) by mouth daily. Swallow whole. 90 tablet 3   cyclobenzaprine (FLEXERIL) 5 MG tablet Take 1 tablet (5 mg total) by mouth 3 (three) times daily as needed for muscle spasms 60 tablet 2   cyclobenzaprine (FLEXERIL) 5 MG tablet take 1 tablet by mouth 3 times every day as needed for muscle spasm 60 tablet 2   fluorouracil (EFUDEX) 5 % cream Apply topically 2 (two) times daily up to 3 weeks as tolerated to affected areas. 40 g 0   ibuprofen (ADVIL) 800 MG tablet Take 800 mg by mouth every 8 (eight) hours as needed.     levonorgestrel (MIRENA) 20 MCG/24HR IUD 1 each by Intrauterine route once.     Lidocaine, Anorectal, 5 % CREA APPLY SMALL AMOUNT AS NEEDED TO RECTAL AREA 30 g 3   Multiple Vitamin (MULTIVITAMIN WITH MINERALS) TABS Take 1 tablet by mouth daily.     nitrofurantoin, macrocrystal-monohydrate, (MACROBID) 100 MG  capsule Take 1 capsule (100 mg total) by mouth 2 (two) times daily. 14 capsule 0   OnabotulinumtoxinA (BOTOX IJ) Inject as directed.     oxyCODONE-acetaminophen (PERCOCET/ROXICET) 5-325 MG tablet Take 1 tablet by mouth every 8 (eight) hours as needed for pain. 90 tablet 0   oxyCODONE-acetaminophen (PERCOCET/ROXICET) 5-325 MG tablet take 1 tablet by oral route every 8 hours as needed for pain (DNF 07/13/22) 90 tablet 0   oxyCODONE-acetaminophen (PERCOCET/ROXICET) 5-325 MG tablet take 1 tablet by oral route every 8 hours as needed for pain (DNF 06/14/22) 90 tablet 0   oxyCODONE-acetaminophen (PERCOCET/ROXICET) 5-325 MG tablet take 1 tablet by oral route every 8 hours as needed for pain (DNF 08/11/22) 90 tablet 0   phentermine (ADIPEX-P) 37.5 MG tablet Take 1 tablet (37.5 mg total) by mouth daily before breakfast. 30 tablet 2   pregabalin (LYRICA) 100 MG capsule Take 1 capsule (100 mg total) by mouth 2 (two) times daily. 180 capsule 1   rifaximin (XIFAXAN) 550 MG TABS tablet Take 1 tablet (550 mg total) by mouth 3 (three) times daily for 14 days 42 tablet 2   rosuvastatin (CRESTOR) 20 MG tablet Take 1 tablet (20 mg total) by mouth daily. 90 tablet 1  topiramate (TOPAMAX) 50 MG tablet Take 1 tablet (50 mg total) by mouth 2 (two) times daily. 180 tablet 3   Ubrogepant (UBRELVY) 100 MG TABS TAKE 1 TABLET BY MOUTH DAILY AS NEEDED, TAKE 1 TABLET BY MOUTH AT ONSET OF HEADACHE, REPEAT IN 2 HOURS IF NEEDED, NO MORE THAN 2 TABS IN 24 HOURS 8 tablet 11   valACYclovir (VALTREX) 1000 MG tablet Take 1 tablet (1,000 mg total) by mouth daily. 90 tablet 1   No current facility-administered medications on file prior to visit.    ROS as in subjective  Reviewed allergies, medications, past medical, surgical, and social history.     Objective: There were no vitals filed for this visit.  General appearance: alert, no distress, WD/WN, female Not examined      Assessment: Encounter Diagnosis  Name Primary?    Recurrent UTI Yes     Plan: Discussed symptoms, diagnosis or urinary tract infection, possible complications, and usual course of illness.  Begin medication macrobid.    [name] was seen today for recurrent uti.  Diagnoses and all orders for this visit:  Recurrent UTI -     POCT Urinalysis DIP (Proadvantage Device)    Call or return if worse or not improving in the next 3-4 days.

## 2022-06-25 ENCOUNTER — Ambulatory Visit (HOSPITAL_COMMUNITY)
Admission: RE | Admit: 2022-06-25 | Discharge: 2022-06-25 | Disposition: A | Discharge status: Home / Self Care | Payer: 59 | Source: Ambulatory Visit | Attending: Family Medicine | Admitting: Family Medicine

## 2022-06-25 DIAGNOSIS — H539 Unspecified visual disturbance: Secondary | ICD-10-CM | POA: Insufficient documentation

## 2022-06-25 DIAGNOSIS — R202 Paresthesia of skin: Secondary | ICD-10-CM | POA: Diagnosis not present

## 2022-06-25 DIAGNOSIS — M542 Cervicalgia: Secondary | ICD-10-CM | POA: Diagnosis not present

## 2022-06-25 MED ORDER — GADOBUTROL 1 MMOL/ML IV SOLN
7.0000 mL | Freq: Once | INTRAVENOUS | Status: AC | PRN
  Administered 2022-06-25: 7 mL via INTRAVENOUS

## 2022-06-29 ENCOUNTER — Encounter: Payer: Self-pay | Admitting: Family Medicine

## 2022-06-30 ENCOUNTER — Telehealth: Payer: 59 | Admitting: Physician Assistant

## 2022-06-30 DIAGNOSIS — J019 Acute sinusitis, unspecified: Secondary | ICD-10-CM

## 2022-06-30 DIAGNOSIS — G4733 Obstructive sleep apnea (adult) (pediatric): Secondary | ICD-10-CM | POA: Diagnosis not present

## 2022-06-30 DIAGNOSIS — B9689 Other specified bacterial agents as the cause of diseases classified elsewhere: Secondary | ICD-10-CM | POA: Diagnosis not present

## 2022-06-30 MED ORDER — BENZONATATE 100 MG PO CAPS: 100.0000 mg | ORAL_CAPSULE | Freq: Three times a day (TID) | ORAL | 0 refills | Status: DC | PRN

## 2022-06-30 MED ORDER — DOXYCYCLINE HYCLATE 100 MG PO TABS: 100.0000 mg | ORAL_TABLET | Freq: Two times a day (BID) | ORAL | 0 refills | Status: DC

## 2022-06-30 NOTE — Progress Notes (Signed)
I have spent 5 minutes in review of e-visit questionnaire, review and updating patient chart, medical decision making and response to patient.   Havard Radigan Cody Adasyn Mcadams, PA-C    

## 2022-06-30 NOTE — Progress Notes (Signed)
E-Visit for Sinus Problems  We are sorry that you are not feeling well.  Here is how we plan to help!  Based on what you have shared with me it looks like you have sinusitis.  Sinusitis is inflammation and infection in the sinus cavities of the head.  Based on your presentation I believe you most likely have Acute Bacterial Sinusitis.  This is an infection caused by bacteria and is treated with antibiotics. I have prescribed Doxycycline '100mg'$  by mouth twice a day for 10 days. You may use an oral decongestant such as Mucinex D or if you have glaucoma or high blood pressure use plain Mucinex. Saline nasal spray help and can safely be used as often as needed for congestion. I have also sent in a prescription cough medication to use as directed.   If you develop worsening sinus pain, fever or notice severe headache and vision changes, or if symptoms are not better after completion of antibiotic, please schedule an appointment with a health care provider.    Sinus infections are not as easily transmitted as other respiratory infection, however we still recommend that you avoid close contact with loved ones, especially the very young and elderly.  Remember to wash your hands thoroughly throughout the day as this is the number one way to prevent the spread of infection!  Home Care: Only take medications as instructed by your medical team. Complete the entire course of an antibiotic. Do not take these medications with alcohol. A steam or ultrasonic humidifier can help congestion.  You can place a towel over your head and breathe in the steam from hot water coming from a faucet. Avoid close contacts especially the very young and the elderly. Cover your mouth when you cough or sneeze. Always remember to wash your hands.  Get Help Right Away If: You develop worsening fever or sinus pain. You develop a severe head ache or visual changes. Your symptoms persist after you have completed your treatment  plan.  Make sure you Understand these instructions. Will watch your condition. Will get help right away if you are not doing well or get worse.  Thank you for choosing an e-visit.  Your e-visit answers were reviewed by a board certified advanced clinical practitioner to complete your personal care plan. Depending upon the condition, your plan could have included both over the counter or prescription medications.  Please review your pharmacy choice. Make sure the pharmacy is open so you can pick up prescription now. If there is a problem, you may contact your provider through [employer] and have the prescription routed to another pharmacy.  Your safety is important to [place]. If you have drug allergies check your prescription carefully.   For the next 24 hours you can use MyChart to ask questions about today's visit, request a non-urgent call back, or ask for a work or school excuse. You will get an email in the next two days asking about your experience. I hope that your e-visit has been valuable and will speed your recovery.

## 2022-07-05 ENCOUNTER — Ambulatory Visit (INDEPENDENT_AMBULATORY_CARE_PROVIDER_SITE_OTHER): Payer: 59 | Admitting: Medical

## 2022-07-05 ENCOUNTER — Encounter: Payer: Self-pay | Admitting: Medical

## 2022-07-05 ENCOUNTER — Other Ambulatory Visit (HOSPITAL_COMMUNITY): Payer: Self-pay

## 2022-07-05 VITALS — BP 124/64 | HR 92 | Temp 98.6°F | Ht 64.0 in | Wt 161.0 lb

## 2022-07-05 DIAGNOSIS — R059 Cough, unspecified: Secondary | ICD-10-CM | POA: Diagnosis not present

## 2022-07-05 DIAGNOSIS — J0110 Acute frontal sinusitis, unspecified: Secondary | ICD-10-CM | POA: Diagnosis not present

## 2022-07-05 MED ORDER — AZITHROMYCIN 250 MG PO TABS
ORAL_TABLET | ORAL | 0 refills | Status: AC
  Filled 2022-07-05: qty 6, 5d supply, fill #0

## 2022-07-05 NOTE — Progress Notes (Signed)
Subjective:  Kelly [name] is a 56 y.o. female who presents for Chief Complaint  Patient presents with   Cough    Still coughing, chest heaviness. Cough x 1 month. Has been running low grade fevers. Still having facial pain. Clear mucus when blowing, but coughing up yellow/brown.      Here for cough and congestion.  Been coughing and sick for last several weeks, coughing a month.  Recently worse facial pain, thick congestion, running low grade fever, productive yellow/brown mucous.  Been around close contacts at work that have been sick.   Was put on Doxycycline last week for similar through televisit.  Having skin surgery in 2 days, mohs.  Using mucinex D for several days.  No other aggravating or relieving factors. No other c/o.  Past Medical History:  Diagnosis Date   Anal fistula    intersphincteric    Anemia    Anticardiolipin antibody positive    Cervicalgia    followed by dr Sherley Bounds   Depression    History of herpes genitalis    many yrs ago   History of kidney stones    Hyperlipidemia    Hypertension    followed by pcp   (11-25-2019  per pt had stress test approx. 2018 w/ Dr Wynonia Lawman, told normal)   Migraines    neurologist--- dr Jaynee Eagles   OSA on CPAP    TMJ (dislocation of temporomandibular joint)    11-25-2019  jaw pops, no appliance   Wears glasses    Current Outpatient Medications on File Prior to Visit  Medication Sig Dispense Refill   amLODipine (NORVASC) 5 MG tablet Take 1 tablet (5 mg total) by mouth daily. 90 tablet 3   aspirin EC 81 MG tablet Take 1 tablet (81 mg total) by mouth daily. Swallow whole. 90 tablet 3   cyclobenzaprine (FLEXERIL) 5 MG tablet take 1 tablet by mouth 3 times every day as needed for muscle spasm 60 tablet 2   diphenhydrAMINE (BENADRYL) 25 MG tablet Take 25 mg by mouth every 6 (six) hours as needed.     doxycycline (VIBRA-TABS) 100 MG tablet Take 1 tablet (100 mg total) by mouth 2 (two) times daily. 20 tablet 0   fluticasone (FLONASE) 50  MCG/ACT nasal spray Place 2 sprays into both nostrils daily.     levonorgestrel (MIRENA) 20 MCG/24HR IUD 1 each by Intrauterine route once.     Multiple Vitamin (MULTIVITAMIN WITH MINERALS) TABS Take 1 tablet by mouth daily.     OnabotulinumtoxinA (BOTOX IJ) Inject as directed.     oxyCODONE-acetaminophen (PERCOCET/ROXICET) 5-325 MG tablet Take 1 tablet by mouth every 8 (eight) hours as needed for pain. 90 tablet 0   pregabalin (LYRICA) 100 MG capsule Take 1 capsule (100 mg total) by mouth 2 (two) times daily. 180 capsule 1   Pseudoephedrine-Acetaminophen (TYLENOL SINUS MAX ST PO) Take 2 tablets by mouth as needed.     pseudoephedrine-guaifenesin (MUCINEX D) 60-600 MG 12 hr tablet Take 1 tablet by mouth every 12 (twelve) hours.     rosuvastatin (CRESTOR) 20 MG tablet Take 1 tablet (20 mg total) by mouth daily. 90 tablet 1   sodium chloride (OCEAN) 0.65 % SOLN nasal spray Place 1 spray into both nostrils as needed for congestion.     topiramate (TOPAMAX) 50 MG tablet Take 1 tablet (50 mg total) by mouth 2 (two) times daily. 180 tablet 3   valACYclovir (VALTREX) 1000 MG tablet Take 1 tablet (1,000 mg total) by  mouth daily. 90 tablet 1   benzonatate (TESSALON) 100 MG capsule Take 1 capsule (100 mg total) by mouth 3 (three) times daily as needed for cough. (Patient not taking: Reported on 07/05/2022) 30 capsule 0   fluorouracil (EFUDEX) 5 % cream Apply topically 2 (two) times daily up to 3 weeks as tolerated to affected areas. (Patient not taking: Reported on 07/05/2022) 40 g 0   ibuprofen (ADVIL) 800 MG tablet Take 800 mg by mouth every 8 (eight) hours as needed. (Patient not taking: Reported on 07/05/2022)     Lidocaine, Anorectal, 5 % CREA APPLY SMALL AMOUNT AS NEEDED TO RECTAL AREA (Patient not taking: Reported on 07/05/2022) 30 g 3   oxyCODONE-acetaminophen (PERCOCET/ROXICET) 5-325 MG tablet take 1 tablet by oral route every 8 hours as needed for pain (DNF 07/13/22) (Patient not taking: Reported on  07/05/2022) 90 tablet 0   oxyCODONE-acetaminophen (PERCOCET/ROXICET) 5-325 MG tablet take 1 tablet by oral route every 8 hours as needed for pain (DNF 06/14/22) (Patient not taking: Reported on 07/05/2022) 90 tablet 0   oxyCODONE-acetaminophen (PERCOCET/ROXICET) 5-325 MG tablet take 1 tablet by oral route every 8 hours as needed for pain (DNF 08/11/22) (Patient not taking: Reported on 07/05/2022) 90 tablet 0   phentermine (ADIPEX-P) 37.5 MG tablet Take 1 tablet (37.5 mg total) by mouth daily before breakfast. (Patient not taking: Reported on 07/05/2022) 30 tablet 2   rifaximin (XIFAXAN) 550 MG TABS tablet Take 1 tablet (550 mg total) by mouth 3 (three) times daily for 14 days (Patient not taking: Reported on 07/05/2022) 42 tablet 2   Ubrogepant (UBRELVY) 100 MG TABS TAKE 1 TABLET BY MOUTH DAILY AS NEEDED, TAKE 1 TABLET BY MOUTH AT ONSET OF HEADACHE, REPEAT IN 2 HOURS IF NEEDED, NO MORE THAN 2 TABS IN 24 HOURS (Patient not taking: Reported on 07/05/2022) 8 tablet 11   No current facility-administered medications on file prior to visit.   The following portions of the patient's history were reviewed and updated as appropriate: allergies, current medications, past family history, past medical history, past social history, past surgical history and problem list.  ROS Otherwise as in subjective above  Objective: BP 124/64   Pulse 92   Temp 98.6 F (37 C) (Tympanic)   Ht '5\' 4"'$  (1.626 m)   Wt 161 lb (73 kg)   LMP  (Exact Date)   SpO2 100%   BMI 27.64 kg/m   General appearance: alert, no distress, well developed, well nourished HEENT: normocephalic, sclerae anicteric, conjunctiva pink and moist, TMs flat nares patent, mild mucoid discharge,+ erythema, pharynx normal Oral cavity: MMM, no lesions Neck: supple, no lymphadenopathy, no thyromegaly, no masses Lungs clear Heart rrr, normal s1, s2, no murmur Pulses: 2+ radial pulses, 2+ pedal pulses, normal cap refill Ext: no  edema   Assessment: Encounter Diagnoses  Name Primary?   Acute non-recurrent frontal sinusitis Yes   Cough, unspecified type      Plan: Advised rest, good hydration, begin antibiotic below.  Continue Mucinex over-the-counter.  Advised to go ahead and use the [employer] she has for cough since she is not using them as much as she could.  If not much improved in the next 3 to 4 days then let me know   Esthela was seen today for cough.  Diagnoses and all orders for this visit:  Acute non-recurrent frontal sinusitis  Cough, unspecified type  Other orders -     azithromycin (ZITHROMAX) 250 MG tablet; Take 2 tablets (500 mg  total) by mouth daily for 1 day, THEN 1 tablet (250 mg total) daily for 4 days.  Follow up: prn

## 2022-07-07 DIAGNOSIS — C44319 Basal cell carcinoma of skin of other parts of face: Secondary | ICD-10-CM | POA: Diagnosis not present

## 2022-07-07 DIAGNOSIS — D485 Neoplasm of uncertain behavior of skin: Secondary | ICD-10-CM | POA: Diagnosis not present

## 2022-07-13 ENCOUNTER — Other Ambulatory Visit (HOSPITAL_COMMUNITY): Payer: Self-pay

## 2022-07-15 ENCOUNTER — Other Ambulatory Visit (HOSPITAL_COMMUNITY): Payer: Self-pay

## 2022-07-16 ENCOUNTER — Telehealth: Payer: Self-pay | Admitting: Urgent Care

## 2022-07-16 DIAGNOSIS — N3000 Acute cystitis without hematuria: Secondary | ICD-10-CM

## 2022-07-16 MED ORDER — NITROFURANTOIN MONOHYD MACRO 100 MG PO CAPS: 100.0000 mg | ORAL_CAPSULE | Freq: Two times a day (BID) | ORAL | 0 refills | Status: AC

## 2022-07-16 MED ORDER — NITROFURANTOIN MONOHYD MACRO 100 MG PO CAPS: 100.0000 mg | ORAL_CAPSULE | Freq: Two times a day (BID) | ORAL | 0 refills | Status: DC

## 2022-07-16 NOTE — Progress Notes (Signed)
E-Visit for Urinary Problems  We are sorry that you are not feeling well.  Here is how we plan to help!  Based on what you shared with me it looks like you most likely have a simple urinary tract infection.  A UTI (Urinary Tract Infection) is a bacterial infection of the bladder.  Most cases of urinary tract infections are simple to treat but a key part of your care is to encourage you to drink plenty of fluids and watch your symptoms carefully.  I have prescribed MacroBid 100 mg twice a day for 5 days.  Your symptoms should gradually improve. Call us if the burning in your urine worsens, you develop worsening fever, back pain or pelvic pain or if your symptoms do not resolve after completing the antibiotic.  Urinary tract infections can be prevented by drinking plenty of water to keep your body hydrated.  Also be sure when you wipe, wipe from front to back and don't hold it in!  If possible, empty your bladder every 4 hours.  HOME CARE Drink plenty of fluids Compete the full course of the antibiotics even if the symptoms resolve Remember, when you need to go.go. Holding in your urine can increase the likelihood of getting a UTI! GET HELP RIGHT AWAY IF: You cannot urinate You get a high fever Worsening back pain occurs You see blood in your urine You feel sick to your stomach or throw up You feel like you are going to pass out  MAKE SURE YOU  Understand these instructions. Will watch your condition. Will get help right away if you are not doing well or get worse.   Thank you for choosing an e-visit.  Your e-visit answers were reviewed by a board certified advanced clinical practitioner to complete your personal care plan. Depending upon the condition, your plan could have included both over the counter or prescription medications.  Please review your pharmacy choice. Make sure the pharmacy is open so you can pick up prescription now. If there is a problem, you may contact your  provider through [employer] and have the prescription routed to another pharmacy.  Your safety is important to [place]. If you have drug allergies check your prescription carefully.   For the next 24 hours you can use MyChart to ask questions about today's visit, request a non-urgent call back, or ask for a work or school excuse. You will get an email in the next two days asking about your experience. I hope that your e-visit has been valuable and will speed your recovery.   I have spent 5 minutes in review of e-visit questionnaire, review and updating patient chart, medical decision making and response to patient.   [place], [place]

## 2022-08-04 ENCOUNTER — Ambulatory Visit: Payer: 59 | Admitting: Neurology

## 2022-08-08 ENCOUNTER — Ambulatory Visit (INDEPENDENT_AMBULATORY_CARE_PROVIDER_SITE_OTHER): Payer: 59 | Admitting: Neurology

## 2022-08-08 DIAGNOSIS — G43709 Chronic migraine without aura, not intractable, without status migrainosus: Secondary | ICD-10-CM | POA: Diagnosis not present

## 2022-08-08 MED ORDER — ONABOTULINUMTOXINA 100 UNITS IJ SOLR
155.0000 [IU] | Freq: Once | INTRAMUSCULAR | Status: AC
  Administered 2022-08-08: 155 [IU] via INTRAMUSCULAR

## 2022-08-08 NOTE — Progress Notes (Signed)
Botox- 100 units x 2 vials Lot: [id number] Expiration: 11/2024 NDC: 2449-7530-05  Bacteriostatic 0.9% Sodium Chloride- 57m total Lot: [id number]: 04/23/2023 NDC: [address]-41 Dx: GC30.131B/B

## 2022-08-08 NOTE — Progress Notes (Signed)
Consent Form Botulism Toxin Injection For Chronic Migraine  08/08/2022: stable doing well  05/12/2022: She will get migrianes 2 weeks after botox when the botox has worn off. This has happened a few times, takes nurtec and then kicks in. after that she does great no more than 4 migraine days a month and no headache days. At baseline she had daily headaches prior to botox.   02/17/2022: >>90% improvement in migraine frequency.  Reviewed orally with patient, additionally signature is on file:  Botulism toxin has been approved by the Federal drug administration for treatment of chronic migraine. Botulism toxin does not cure chronic migraine and it may not be effective in some patients.  The administration of botulism toxin is accomplished by injecting a small amount of toxin into the muscles of the neck and head. Dosage must be titrated for each individual. Any benefits resulting from botulism toxin tend to wear off after 3 months with a repeat injection required if benefit is to be maintained. Injections are usually done every 3-4 months with maximum effect peak achieved by about 2 or 3 weeks. Botulism toxin is expensive and you should be sure of what costs you will incur resulting from the injection.  The side effects of botulism toxin use for chronic migraine may include:   -Transient, and usually mild, facial weakness with facial injections  -Transient, and usually mild, head or neck weakness with head/neck injections  -Reduction or loss of forehead facial animation due to forehead muscle weakness  -Eyelid drooping  -Dry eye  -Pain at the site of injection or bruising at the site of injection  -Double vision  -Potential unknown long term risks  Contraindications: You should not have Botox if you are pregnant, nursing, allergic to albumin, have an infection, skin condition, or muscle weakness at the site of the injection, or have myasthenia gravis, Lambert-Eaton syndrome, or ALS.  It is  also possible that as with any injection, there may be an allergic reaction or no effect from the medication. Reduced effectiveness after repeated injections is sometimes seen and rarely infection at the injection site may occur. All care will be taken to prevent these side effects. If therapy is given over a long time, atrophy and wasting in the muscle injected may occur. Occasionally the patient's become refractory to treatment because they develop antibodies to the toxin. In this event, therapy needs to be modified.  I have read the above information and consent to the administration of botulism toxin.    BOTOX PROCEDURE NOTE FOR MIGRAINE HEADACHE    Contraindications and precautions discussed with patient(above). Aseptic procedure was observed and patient tolerated procedure. Procedure performed by Dr. Georgia Dom  The condition has existed for more than 6 months, and pt does not have a diagnosis of ALS, Myasthenia Gravis or Lambert-Eaton Syndrome.  Risks and benefits of injections discussed and pt agrees to proceed with the procedure.  Written consent obtained  These injections are medically necessary. Pt  receives good benefits from these injections. These injections do not cause sedations or hallucinations which the oral therapies may cause.  Description of procedure:  The patient was placed in a sitting position. The standard protocol was used for Botox as follows, with 5 units of Botox injected at each site:   -Procerus muscle, midline injection  -Corrugator muscle, bilateral injection  -Frontalis muscle, bilateral injection, with 2 sites each side, medial injection was performed in the upper one third of the frontalis muscle, in the region vertical from  the medial inferior edge of the superior orbital rim. The lateral injection was again in the upper one third of the forehead vertically above the lateral limbus of the cornea, 1.5 cm lateral to the medial injection  site.  -Temporalis muscle injection, 4 sites, bilaterally. The first injection was 3 cm above the tragus of the ear, second injection site was 1.5 cm to 3 cm up from the first injection site in line with the tragus of the ear. The third injection site was 1.5-3 cm forward between the first 2 injection sites. The fourth injection site was 1.5 cm posterior to the second injection site.   -Occipitalis muscle injection, 3 sites, bilaterally. The first injection was done one half way between the occipital protuberance and the tip of the mastoid process behind the ear. The second injection site was done lateral and superior to the first, 1 fingerbreadth from the first injection. The third injection site was 1 fingerbreadth superiorly and medially from the first injection site.  -Cervical paraspinal muscle injection, 2 sites, bilateral knee first injection site was 1 cm from the midline of the cervical spine, 3 cm inferior to the lower border of the occipital protuberance. The second injection site was 1.5 cm superiorly and laterally to the first injection site.  -Trapezius muscle injection was performed at 3 sites, bilaterally. The first injection site was in the upper trapezius muscle halfway between the inflection point of the neck, and the acromion. The second injection site was one half way between the acromion and the first injection site. The third injection was done between the first injection site and the inflection point of the neck.   Will return for repeat injection in 3 months.   155 units of Botox was used, 45u Botox not injected was wasted. The patient tolerated the procedure well, there were no complications of the above procedure.

## 2022-08-10 ENCOUNTER — Other Ambulatory Visit (HOSPITAL_COMMUNITY): Payer: Self-pay

## 2022-08-12 ENCOUNTER — Other Ambulatory Visit (HOSPITAL_COMMUNITY): Payer: Self-pay

## 2022-08-16 ENCOUNTER — Other Ambulatory Visit (HOSPITAL_COMMUNITY): Payer: Self-pay

## 2022-08-17 ENCOUNTER — Other Ambulatory Visit (HOSPITAL_COMMUNITY): Payer: Self-pay

## 2022-08-17 MED ORDER — OXYCODONE-ACETAMINOPHEN 5-325 MG PO TABS
ORAL_TABLET | ORAL | 0 refills | Status: DC
  Filled 2022-08-17: qty 90, 30d supply, fill #0

## 2022-08-26 ENCOUNTER — Telehealth: Payer: Self-pay | Admitting: *Deleted

## 2022-08-26 NOTE — Telephone Encounter (Signed)
Per Dr. [name] can be seen Monday at 1:45. Left message for Kelly [name] to return our call

## 2022-08-26 NOTE — Telephone Encounter (Signed)
Patient called with complains of vaginal itching, discharge, vaginal odor and groin swelling. Patient states she has taken 1 dose of Diflucan no improvement. Pt request to be seen in office.

## 2022-08-29 ENCOUNTER — Ambulatory Visit: Payer: Commercial Managed Care - PPO | Admitting: Obstetrics & Gynecology

## 2022-08-30 NOTE — Telephone Encounter (Signed)
haffer, Dondra Prader, [place] hours ago (3:26 PM)   CS **Patient called at 8 am 08/29/22 stating that she no longer needs to come in bc she feels better

## 2022-08-31 ENCOUNTER — Telehealth: Payer: Self-pay | Admitting: Neurology

## 2022-08-31 ENCOUNTER — Other Ambulatory Visit (HOSPITAL_COMMUNITY): Payer: Self-pay

## 2022-08-31 ENCOUNTER — Ambulatory Visit (INDEPENDENT_AMBULATORY_CARE_PROVIDER_SITE_OTHER): Payer: Commercial Managed Care - PPO | Admitting: Radiology

## 2022-08-31 ENCOUNTER — Ambulatory Visit: Payer: 59 | Admitting: Neurology

## 2022-08-31 VITALS — BP 122/76

## 2022-08-31 DIAGNOSIS — N760 Acute vaginitis: Secondary | ICD-10-CM

## 2022-08-31 DIAGNOSIS — N949 Unspecified condition associated with female genital organs and menstrual cycle: Secondary | ICD-10-CM

## 2022-08-31 LAB — URINALYSIS W MICROSCOPIC + REFLEX CULTURE
Bacteria, UA: NONE SEEN /HPF
Bilirubin Urine: NEGATIVE
Glucose, UA: NEGATIVE
Hgb urine dipstick: NEGATIVE
Hyaline Cast: NONE SEEN /LPF
Ketones, ur: NEGATIVE
Leukocyte Esterase: NEGATIVE
Nitrites, Initial: NEGATIVE
Protein, ur: NEGATIVE
RBC / HPF: NONE SEEN /HPF (ref 0–2)
Specific Gravity, Urine: 1.02 (ref 1.001–1.035)
WBC, UA: NONE SEEN /HPF (ref 0–5)
pH: 6.5 (ref 5.0–8.0)

## 2022-08-31 LAB — NO CULTURE INDICATED

## 2022-08-31 LAB — WET PREP FOR TRICH, YEAST, CLUE

## 2022-08-31 MED ORDER — METRONIDAZOLE 500 MG PO TABS
500.0000 mg | ORAL_TABLET | Freq: Two times a day (BID) | ORAL | 0 refills | Status: DC
  Filled 2022-08-31: qty 14, 7d supply, fill #0

## 2022-08-31 NOTE — Progress Notes (Signed)
      Subjective: Kelly [name] is a 57 y.o. female who complains of vaginal d/c and itching x 1 week. Some burning, requests urine check. Took diflucan '150mg'$  po last week with no improvement.  Review of Systems  Past Medical History:  Diagnosis Date   Anal fistula    intersphincteric    Anemia    Anticardiolipin antibody positive    Cervicalgia    followed by dr Sherley Bounds   Depression    History of herpes genitalis    many yrs ago   History of kidney stones    Hyperlipidemia    Hypertension    followed by pcp   (11-25-2019  per pt had stress test approx. 2018 w/ Dr Wynonia Lawman, told normal)   Migraines    neurologist--- dr Jaynee Eagles   OSA on CPAP    TMJ (dislocation of temporomandibular joint)    11-25-2019  jaw pops, no appliance   Wears glasses       Objective:  Today's Vitals   08/31/22 1334  BP: 122/76   There is no height or weight on file to calculate BMI.   -General: no acute distress -Vulva: without lesions or discharge -Vagina: discharge present, aptima swab and wet prep obtained -Cervix: no lesion or discharge, no CMT -Perineum: no lesions -Uterus: Mobile, non tender -Adnexa: no masses or tenderness   Microscopic wet-mount exam shows clue cells.   Chaperone offered and declined.  Assessment:/Plan:   1. Acute vaginitis - WET PREP FOR TRICH, YEAST, CLUE - metroNIDAZOLE (FLAGYL) 500 MG tablet; Take 1 tablet (500 mg total) by mouth 2 (two) times daily.  Dispense: 14 tablet; Refill: 0  2. Vaginal burning Requests UA - Urinalysis w microscopic + reflex cultur     Will contact patient with results of testing completed today. Avoid intercourse until symptoms are resolved. Safe sex encouraged. Avoid the use of soaps or perfumed products in the peri area. Avoid tub baths and sitting in sweaty or wet clothing for prolonged periods of time.

## 2022-08-31 NOTE — Telephone Encounter (Signed)
LVM and sent mychart msg informing pt of cancellation of today's appt due to MD being out of office.

## 2022-09-01 ENCOUNTER — Other Ambulatory Visit (HOSPITAL_COMMUNITY): Payer: Self-pay

## 2022-09-01 MED ORDER — DIAZEPAM 5 MG PO TABS
ORAL_TABLET | ORAL | 0 refills | Status: DC
  Filled 2022-09-01: qty 2, 1d supply, fill #0

## 2022-09-02 DIAGNOSIS — M5412 Radiculopathy, cervical region: Secondary | ICD-10-CM | POA: Diagnosis not present

## 2022-09-07 ENCOUNTER — Other Ambulatory Visit (HOSPITAL_COMMUNITY): Payer: Self-pay

## 2022-09-07 DIAGNOSIS — Q761 Klippel-Feil syndrome: Secondary | ICD-10-CM | POA: Diagnosis not present

## 2022-09-07 DIAGNOSIS — F1120 Opioid dependence, uncomplicated: Secondary | ICD-10-CM | POA: Diagnosis not present

## 2022-09-07 DIAGNOSIS — M5412 Radiculopathy, cervical region: Secondary | ICD-10-CM | POA: Diagnosis not present

## 2022-09-07 MED ORDER — OXYCODONE-ACETAMINOPHEN 5-325 MG PO TABS
1.0000 | ORAL_TABLET | Freq: Three times a day (TID) | ORAL | 0 refills | Status: DC | PRN
  Filled 2022-09-16: qty 90, 30d supply, fill #0

## 2022-09-07 MED ORDER — CYCLOBENZAPRINE HCL 5 MG PO TABS
5.0000 mg | ORAL_TABLET | Freq: Two times a day (BID) | ORAL | 11 refills | Status: DC | PRN
  Filled 2022-09-07: qty 60, 30d supply, fill #0

## 2022-09-07 MED ORDER — OXYCODONE-ACETAMINOPHEN 5-325 MG PO TABS
1.0000 | ORAL_TABLET | Freq: Three times a day (TID) | ORAL | 0 refills | Status: DC | PRN
  Filled 2022-11-14: qty 90, 30d supply, fill #0

## 2022-09-07 MED ORDER — OXYCODONE-ACETAMINOPHEN 5-325 MG PO TABS
1.0000 | ORAL_TABLET | Freq: Three times a day (TID) | ORAL | 0 refills | Status: DC | PRN
  Filled 2022-10-17: qty 90, 30d supply, fill #0

## 2022-09-07 MED ORDER — PREGABALIN 100 MG PO CAPS
100.0000 mg | ORAL_CAPSULE | Freq: Two times a day (BID) | ORAL | 1 refills | Status: DC
  Filled 2022-09-07: qty 180, 90d supply, fill #0
  Filled 2022-11-14: qty 60, 30d supply, fill #0
  Filled 2022-12-06 – 2023-02-07 (×2): qty 60, 30d supply, fill #1
  Filled 2023-04-04: qty 60, 30d supply, fill #2
  Filled 2023-05-26: qty 60, 30d supply, fill #3
  Filled 2023-07-28: qty 60, 30d supply, fill #4

## 2022-09-09 ENCOUNTER — Other Ambulatory Visit (HOSPITAL_COMMUNITY): Payer: Self-pay

## 2022-09-15 ENCOUNTER — Other Ambulatory Visit (HOSPITAL_COMMUNITY): Payer: Self-pay

## 2022-09-16 ENCOUNTER — Other Ambulatory Visit (HOSPITAL_COMMUNITY): Payer: Self-pay

## 2022-09-20 DIAGNOSIS — G4733 Obstructive sleep apnea (adult) (pediatric): Secondary | ICD-10-CM | POA: Diagnosis not present

## 2022-09-22 ENCOUNTER — Encounter: Payer: Self-pay | Admitting: Rheumatology

## 2022-09-27 ENCOUNTER — Other Ambulatory Visit (HOSPITAL_COMMUNITY): Payer: Self-pay

## 2022-09-28 ENCOUNTER — Telehealth: Payer: Self-pay | Admitting: Neurology

## 2022-09-28 DIAGNOSIS — G43709 Chronic migraine without aura, not intractable, without status migrainosus: Secondary | ICD-10-CM

## 2022-09-28 NOTE — Telephone Encounter (Signed)
Monica, could you check and make sure this pt has an [place] on file for botox? She's scheduled for 11/01/22. Thank you!

## 2022-10-06 ENCOUNTER — Other Ambulatory Visit: Payer: Self-pay | Admitting: Cardiovascular Disease

## 2022-10-06 ENCOUNTER — Other Ambulatory Visit: Payer: Self-pay

## 2022-10-07 ENCOUNTER — Other Ambulatory Visit (HOSPITAL_COMMUNITY): Payer: Self-pay

## 2022-10-07 ENCOUNTER — Other Ambulatory Visit: Payer: Self-pay | Admitting: *Deleted

## 2022-10-07 DIAGNOSIS — N760 Acute vaginitis: Secondary | ICD-10-CM

## 2022-10-07 MED ORDER — ROSUVASTATIN CALCIUM 20 MG PO TABS: 20.0000 mg | ORAL_TABLET | Freq: Every day | ORAL | 1 refills | Status: DC

## 2022-10-07 MED ORDER — METRONIDAZOLE 500 MG PO TABS
500.0000 mg | ORAL_TABLET | Freq: Two times a day (BID) | ORAL | 0 refills | Status: DC
  Filled 2022-10-07: qty 14, 7d supply, fill #0

## 2022-10-07 NOTE — Telephone Encounter (Signed)
Ok to send 7 day supply, if not improvement will need repeat visit. Thanks.

## 2022-10-07 NOTE — Telephone Encounter (Signed)
Checking for update on auth, pt scheduled for 11/01/22

## 2022-10-11 ENCOUNTER — Other Ambulatory Visit (HOSPITAL_COMMUNITY): Payer: Self-pay

## 2022-10-12 ENCOUNTER — Other Ambulatory Visit (HOSPITAL_COMMUNITY): Payer: Self-pay

## 2022-10-12 NOTE — Telephone Encounter (Signed)
Pharmacy Patient Advocate Encounter   Received notification from [place] that prior authorization for Botox 200UNIT solution is required/requested.    PA submitted on 10/12/2022 to (ins) [employer] via CoverMyMeds Key [id number]  Status is pending

## 2022-10-12 NOTE — Telephone Encounter (Signed)
Any updates on this pt?

## 2022-10-17 ENCOUNTER — Other Ambulatory Visit (HOSPITAL_COMMUNITY): Payer: Self-pay

## 2022-10-17 ENCOUNTER — Other Ambulatory Visit: Payer: Self-pay

## 2022-10-17 MED ORDER — ONABOTULINUMTOXINA 200 UNITS IJ SOLR
INTRAMUSCULAR | 3 refills | Status: DC
  Filled 2022-10-17: qty 1, fill #0

## 2022-10-17 MED ORDER — ONABOTULINUMTOXINA 200 UNITS IJ SOLR
INTRAMUSCULAR | 3 refills | Status: DC
  Filled 2022-10-17: qty 1, fill #0
  Filled 2022-10-17: qty 1, 84d supply, fill #0
  Filled 2023-01-18: qty 1, 84d supply, fill #1
  Filled 2023-04-03: qty 1, 84d supply, fill #2
  Filled 2023-07-11: qty 1, 84d supply, fill #3

## 2022-10-17 NOTE — Telephone Encounter (Signed)
Pharmacy Patient Advocate Encounter- Botox BIV-Pharmacy Benefit:  PA was submitted to [employer] and has been approved through: 04/12/2023 Authorization# PA Case ID #: [id number]  Please send prescription to Specialty Pharmacy: [facility]: [phone]  Estimated Copay is: $4.00  Patient Is Not eligible for Botox Copay Card, which will make patient's copay as little as zero. Copay card will be provided to pharmacy.

## 2022-10-17 NOTE — Addendum Note (Signed)
Addended by: Gildardo Griffes on: 10/17/2022 09:20 AM   Modules accepted: Orders

## 2022-10-17 NOTE — Telephone Encounter (Signed)
Botox 200 unit Rx sent to [facility].

## 2022-10-18 ENCOUNTER — Other Ambulatory Visit (HOSPITAL_COMMUNITY): Payer: Self-pay

## 2022-10-18 ENCOUNTER — Ambulatory Visit (INDEPENDENT_AMBULATORY_CARE_PROVIDER_SITE_OTHER): Payer: Commercial Managed Care - PPO | Admitting: Medical

## 2022-10-18 ENCOUNTER — Encounter: Payer: Self-pay | Admitting: Medical

## 2022-10-18 VITALS — BP 120/70 | HR 64 | Temp 99.3°F | Ht 64.0 in | Wt 164.0 lb

## 2022-10-18 DIAGNOSIS — R309 Painful micturition, unspecified: Secondary | ICD-10-CM | POA: Diagnosis not present

## 2022-10-18 DIAGNOSIS — R350 Frequency of micturition: Secondary | ICD-10-CM | POA: Diagnosis not present

## 2022-10-18 LAB — POCT URINALYSIS DIP (PROADVANTAGE DEVICE)
Bilirubin, UA: NEGATIVE
Glucose, UA: NEGATIVE mg/dL
Ketones, POC UA: NEGATIVE mg/dL
Nitrite, UA: NEGATIVE
Specific Gravity, Urine: 1.025
Urobilinogen, Ur: 0.2
pH, UA: 6 (ref 5.0–8.0)

## 2022-10-18 MED ORDER — NITROFURANTOIN MONOHYD MACRO 100 MG PO CAPS
100.0000 mg | ORAL_CAPSULE | Freq: Two times a day (BID) | ORAL | 0 refills | Status: DC
  Filled 2022-10-18: qty 14, 7d supply, fill #0

## 2022-10-18 NOTE — Progress Notes (Signed)
Subjective:  Kelly [name] is a 57 y.o. female who presents for Chief Complaint  Patient presents with   Urinary Frequency    Urinary frequency, pain and some blood in urine that started yesterday and worsened today.     Here for possible UTI.  Started yesterday but worse today.  She notes urinary frequency, some pain, some blood that started this morning.  Last UTI maybe 6 months ago.  Saw urology 02/2022 for recurrent dysuria symptoms, had cystoscopy and no abnormality found thankfully.   At that time was recommended to try vesicare, but given her history of dry mouths, she didn't start the medication.    Has chronic dry mouth in general so she never started the vesicare per urology 02/2022.   No other aggravating or relieving factors.    No other c/o.  The following portions of the patient's history were reviewed and updated as appropriate: allergies, current medications, past family history, past medical history, past social history, past surgical history and problem list.  ROS Otherwise as in subjective above    Objective: BP 120/70   Pulse 64   Temp 99.3 F (37.4 C) (Tympanic)   Ht '5\' 4"'$  (1.626 m)   Wt 164 lb (74.4 kg)   SpO2 98%   BMI 28.15 kg/m   General appearance: alert, no distress, well developed, well nourished    Assessment: Encounter Diagnoses  Name Primary?   Urinary frequency Yes   Pain with urination      Plan: Empirically begin macrobid.  She has several antibiotic allergies. Urine culture sent. If worse symptoms in the next few days, call or recheck  Darcee was seen today for urinary frequency.  Diagnoses and all orders for this visit:  Urinary frequency -     POCT Urinalysis DIP (Proadvantage Device) -     Urine Culture  Pain with urination -     POCT Urinalysis DIP (Proadvantage Device) -     Urine Culture  Other orders -     nitrofurantoin, macrocrystal-monohydrate, (MACROBID) 100 MG capsule; Take 1 capsule (100 mg total) by mouth 2  (two) times daily.    Follow up: pending culture

## 2022-10-20 DIAGNOSIS — G4733 Obstructive sleep apnea (adult) (pediatric): Secondary | ICD-10-CM | POA: Diagnosis not present

## 2022-10-20 LAB — URINE CULTURE

## 2022-10-20 NOTE — Progress Notes (Signed)
Results sent through MyChart

## 2022-11-01 ENCOUNTER — Ambulatory Visit (INDEPENDENT_AMBULATORY_CARE_PROVIDER_SITE_OTHER): Payer: Commercial Managed Care - PPO | Admitting: Neurology

## 2022-11-01 DIAGNOSIS — G43709 Chronic migraine without aura, not intractable, without status migrainosus: Secondary | ICD-10-CM | POA: Diagnosis not present

## 2022-11-01 MED ORDER — ONABOTULINUMTOXINA 200 UNITS IJ SOLR
155.0000 [IU] | Freq: Once | INTRAMUSCULAR | Status: AC
  Administered 2022-11-01: 155 [IU] via INTRAMUSCULAR

## 2022-11-01 NOTE — Progress Notes (Signed)
Botox- 200 units x 1 vial Lot: [id number] Expiration: 01/2025 NDC: [id number]  Bacteriostatic 0.9% Sodium Chloride- 4 mL total Lot: [id number] Expiration: 06/2024 NDC: [id number]  Dx: [id number] S/P Witnessed by Claiborne Rigg

## 2022-11-01 NOTE — Progress Notes (Signed)
Consent Form Botulism Toxin Injection For Chronic Migraine  08/08/2022: stable doing well, minimum > 60% - 70 improvement in migraine and headache frequency  05/12/2022: She will get migrianes 2 weeks after botox when the botox has worn off. This has happened a few times, takes nurtec and then kicks in. after that she does great no more than 4 migraine days a month and no headache days. At baseline she had daily headaches prior to botox.   02/17/2022: >>90% improvement in migraine frequency.  Reviewed orally with patient, additionally signature is on file:  Botulism toxin has been approved by the Federal drug administration for treatment of chronic migraine. Botulism toxin does not cure chronic migraine and it may not be effective in some patients.  The administration of botulism toxin is accomplished by injecting a small amount of toxin into the muscles of the neck and head. Dosage must be titrated for each individual. Any benefits resulting from botulism toxin tend to wear off after 3 months with a repeat injection required if benefit is to be maintained. Injections are usually done every 3-4 months with maximum effect peak achieved by about 2 or 3 weeks. Botulism toxin is expensive and you should be sure of what costs you will incur resulting from the injection.  The side effects of botulism toxin use for chronic migraine may include:   -Transient, and usually mild, facial weakness with facial injections  -Transient, and usually mild, head or neck weakness with head/neck injections  -Reduction or loss of forehead facial animation due to forehead muscle weakness  -Eyelid drooping  -Dry eye  -Pain at the site of injection or bruising at the site of injection  -Double vision  -Potential unknown long term risks  Contraindications: You should not have Botox if you are pregnant, nursing, allergic to albumin, have an infection, skin condition, or muscle weakness at the site of the injection, or  have myasthenia gravis, Lambert-Eaton syndrome, or ALS.  It is also possible that as with any injection, there may be an allergic reaction or no effect from the medication. Reduced effectiveness after repeated injections is sometimes seen and rarely infection at the injection site may occur. All care will be taken to prevent these side effects. If therapy is given over a long time, atrophy and wasting in the muscle injected may occur. Occasionally the patient's become refractory to treatment because they develop antibodies to the toxin. In this event, therapy needs to be modified.  I have read the above information and consent to the administration of botulism toxin.    BOTOX PROCEDURE NOTE FOR MIGRAINE HEADACHE    Contraindications and precautions discussed with patient(above). Aseptic procedure was observed and patient tolerated procedure. Procedure performed by Dr. Georgia Dom  The condition has existed for more than 6 months, and pt does not have a diagnosis of ALS, Myasthenia Gravis or Lambert-Eaton Syndrome.  Risks and benefits of injections discussed and pt agrees to proceed with the procedure.  Written consent obtained  These injections are medically necessary. Pt  receives good benefits from these injections. These injections do not cause sedations or hallucinations which the oral therapies may cause.  Description of procedure:  The patient was placed in a sitting position. The standard protocol was used for Botox as follows, with 5 units of Botox injected at each site:   -Procerus muscle, midline injection  -Corrugator muscle, bilateral injection  -Frontalis muscle, bilateral injection, with 2 sites each side, medial injection was performed in the upper  one third of the frontalis muscle, in the region vertical from the medial inferior edge of the superior orbital rim. The lateral injection was again in the upper one third of the forehead vertically above the lateral limbus of the  cornea, 1.5 cm lateral to the medial injection site.  -Temporalis muscle injection, 4 sites, bilaterally. The first injection was 3 cm above the tragus of the ear, second injection site was 1.5 cm to 3 cm up from the first injection site in line with the tragus of the ear. The third injection site was 1.5-3 cm forward between the first 2 injection sites. The fourth injection site was 1.5 cm posterior to the second injection site.   -Occipitalis muscle injection, 3 sites, bilaterally. The first injection was done one half way between the occipital protuberance and the tip of the mastoid process behind the ear. The second injection site was done lateral and superior to the first, 1 fingerbreadth from the first injection. The third injection site was 1 fingerbreadth superiorly and medially from the first injection site.  -Cervical paraspinal muscle injection, 2 sites, bilateral knee first injection site was 1 cm from the midline of the cervical spine, 3 cm inferior to the lower border of the occipital protuberance. The second injection site was 1.5 cm superiorly and laterally to the first injection site.  -Trapezius muscle injection was performed at 3 sites, bilaterally. The first injection site was in the upper trapezius muscle halfway between the inflection point of the neck, and the acromion. The second injection site was one half way between the acromion and the first injection site. The third injection was done between the first injection site and the inflection point of the neck.   Will return for repeat injection in 3 months.   155 units of Botox was used, 45u Botox not injected was wasted. The patient tolerated the procedure well, there were no complications of the above procedure.

## 2022-11-09 ENCOUNTER — Ambulatory Visit (INDEPENDENT_AMBULATORY_CARE_PROVIDER_SITE_OTHER): Payer: Commercial Managed Care - PPO | Admitting: Obstetrics & Gynecology

## 2022-11-09 ENCOUNTER — Encounter: Payer: Self-pay | Admitting: Obstetrics & Gynecology

## 2022-11-09 ENCOUNTER — Other Ambulatory Visit (HOSPITAL_COMMUNITY)
Admission: RE | Admit: 2022-11-09 | Discharge: 2022-11-09 | Disposition: A | Discharge status: Home / Self Care | Payer: Commercial Managed Care - PPO | Source: Ambulatory Visit | Attending: Obstetrics & Gynecology | Admitting: Obstetrics & Gynecology

## 2022-11-09 ENCOUNTER — Other Ambulatory Visit (HOSPITAL_COMMUNITY): Payer: Self-pay

## 2022-11-09 VITALS — BP 116/68 | HR 85 | Ht 63.25 in | Wt 165.0 lb

## 2022-11-09 DIAGNOSIS — Z30431 Encounter for routine checking of intrauterine contraceptive device: Secondary | ICD-10-CM | POA: Diagnosis not present

## 2022-11-09 DIAGNOSIS — E663 Overweight: Secondary | ICD-10-CM | POA: Diagnosis not present

## 2022-11-09 DIAGNOSIS — Z01419 Encounter for gynecological examination (general) (routine) without abnormal findings: Secondary | ICD-10-CM | POA: Diagnosis not present

## 2022-11-09 DIAGNOSIS — N898 Other specified noninflammatory disorders of vagina: Secondary | ICD-10-CM | POA: Diagnosis not present

## 2022-11-09 LAB — WET PREP FOR TRICH, YEAST, CLUE

## 2022-11-09 MED ORDER — PHENTERMINE HCL 37.5 MG PO TABS
37.5000 mg | ORAL_TABLET | Freq: Every day | ORAL | 2 refills | Status: DC
  Filled 2022-11-09: qty 30, 30d supply, fill #0
  Filled 2023-02-07: qty 30, 30d supply, fill #1
  Filled 2023-04-04: qty 30, 30d supply, fill #2

## 2022-11-09 NOTE — Progress Notes (Signed)
[place] 1965/12/24 [id number]   History:    57 y.o. [id number]  Boyfriend x 3 years   RP:  Established patient presenting for annual gyn exam    HPI: Well on Mirena IUD inserted on 07/11/2018. No menses x last year.  No hot flushes. No pelvic pain. Pt c/o vaginal odor, pain with intercourse. Pap Neg 10/2019.  Pap reflex today.  Breasts normal. Mammo Neg 11/2021, scheduled for 11/2022.  BMI at 29.0. Physically active. Trying to loose weight, would like to be on Phentermine again x 3 months. Fasting Health labs ordered, to be done in her [facility] office.  COLONOSCOPY: 10-16-19.    Past medical history,surgical history, family history and social history were all reviewed and documented in the EPIC chart.  Gynecologic History No LMP recorded. (Menstrual status: IUD).  Obstetric History OB History  Gravida Para Term Preterm AB Living  4 3 3   1 3   SAB IAB Ectopic Multiple Live Births  1       3    # Outcome Date GA Lbr Len/2nd Weight Sex Delivery Anes PTL Lv  4 SAB           3 Term     F CS-Unspec  N LIV  2 Term     F CS-Unspec  N LIV  1 Term     M CS-Unspec  N LIV     ROS: A ROS was performed and pertinent positives and negatives are included in the history. GENERAL: No fevers or chills. HEENT: No change in vision, no earache, sore throat or sinus congestion. NECK: No pain or stiffness. CARDIOVASCULAR: No chest pain or pressure. No palpitations. PULMONARY: No shortness of breath, cough or wheeze. GASTROINTESTINAL: No abdominal pain, nausea, vomiting or diarrhea, melena or bright red blood per rectum. GENITOURINARY: No urinary frequency, urgency, hesitancy or dysuria. MUSCULOSKELETAL: No joint or muscle pain, no back pain, no recent trauma. DERMATOLOGIC: No rash, no itching, no lesions. ENDOCRINE: No polyuria, polydipsia, no heat or cold intolerance. No recent change in weight. HEMATOLOGICAL: No anemia or easy bruising or bleeding. NEUROLOGIC: No headache, seizures, numbness, tingling or  weakness. PSYCHIATRIC: No depression, no loss of interest in normal activity or change in sleep pattern.     Exam:   BP 116/68   Pulse 85   Ht 5' 3.25" (1.607 m)   Wt 165 lb (74.8 kg)   SpO2 99%   BMI 29.00 kg/m   Body mass index is 29 kg/m.  General appearance : Well developed well nourished female. No acute distress HEENT: Eyes: no retinal hemorrhage or exudates,  Neck supple, trachea midline, no carotid bruits, no thyroidmegaly Lungs: Clear to auscultation, no rhonchi or wheezes, or rib retractions  Heart: Regular rate and rhythm, no murmurs or gallops Breast:Examined in sitting and supine position were symmetrical in appearance, no palpable masses or tenderness,  no skin retraction, no nipple inversion, no nipple discharge, no skin discoloration, no axillary or supraclavicular lymphadenopathy Abdomen: no palpable masses or tenderness, no rebound or guarding Extremities: no edema or skin discoloration or tenderness  Pelvic: Vulva: Normal             Vagina: No gross lesions.  Mild discharge.  Wet prep done.  Cervix: No gross lesions or discharge.  Pap reflex done. Short IUD strings felt on exam.  Uterus  AV, normal size, shape and consistency, non-tender and mobile  Adnexa  Without masses or tenderness  Anus: Normal.  Rectal exam Neg.  Wet prep Neg   Assessment/Plan:  57 y.o. female for annual exam   1. Encounter for routine gynecological examination with Papanicolaou smear of cervix Well on Mirena IUD inserted on 07/11/2018. No menses x last year.  No hot flushes. No pelvic pain. Pt c/o vaginal odor, pain with intercourse. Pap Neg 10/2019.  Pap reflex today.  Breasts normal. Mammo Neg 11/2021, scheduled for 11/2022.  BMI at 29.0. Physically active. Trying to loose weight, would like to be on Phentermine again x 3 months. Fasting Health labs ordered, to be done in her [facility] office.  COLONOSCOPY: 10-16-19.  - CBC; Future - Comp Met (CMET); Future - TSH; Future - Lipid Profile;  Future - Vitamin D (25 hydroxy); Future - Cytology - PAP( [place])/Gono-Chlam  2. Encounter for routine checking of intrauterine contraceptive device (IUD) Well on Mirena IUD x 5 years.  In good position.  Check menopausal status. - FSH; Future  3. Vaginal odor Wet prep Neg, reassured. - WET PREP FOR TRICH, YEAST, CLUE - Cytology - PAP( )/Gono-Chlam  4. Overweight Low calorie/carb diet.  Fitness activities.  Phentermine x 3 months, no CI.  Other orders - phentermine (ADIPEX-P) 37.5 MG tablet; Take 1 tablet (37.5 mg total) by mouth daily before breakfast.   [name] MD, 3:11 PM

## 2022-11-11 ENCOUNTER — Other Ambulatory Visit (HOSPITAL_COMMUNITY): Payer: Self-pay

## 2022-11-11 LAB — CYTOLOGY - PAP
Adequacy: ABSENT
Chlamydia: NEGATIVE
Comment: NEGATIVE
Comment: NORMAL
Diagnosis: NEGATIVE
Neisseria Gonorrhea: NEGATIVE

## 2022-11-14 ENCOUNTER — Other Ambulatory Visit: Payer: Self-pay | Admitting: Nurse Practitioner

## 2022-11-14 ENCOUNTER — Other Ambulatory Visit (HOSPITAL_COMMUNITY): Payer: Self-pay

## 2022-11-14 DIAGNOSIS — B009 Herpesviral infection, unspecified: Secondary | ICD-10-CM

## 2022-11-14 MED ORDER — CYCLOBENZAPRINE HCL 5 MG PO TABS
5.0000 mg | ORAL_TABLET | Freq: Three times a day (TID) | ORAL | 11 refills | Status: DC | PRN
  Filled 2022-11-14: qty 60, 20d supply, fill #0
  Filled 2023-02-07: qty 60, 20d supply, fill #1
  Filled 2023-04-04: qty 60, 20d supply, fill #2
  Filled 2023-07-28: qty 60, 20d supply, fill #3

## 2022-11-14 MED ORDER — VALACYCLOVIR HCL 1 G PO TABS
1000.0000 mg | ORAL_TABLET | Freq: Every day | ORAL | 3 refills | Status: DC
  Filled 2022-11-14: qty 90, 90d supply, fill #0
  Filled 2023-02-07: qty 90, 90d supply, fill #1
  Filled 2023-05-26: qty 90, 90d supply, fill #2
  Filled 2023-09-08: qty 90, 90d supply, fill #3

## 2022-11-14 NOTE — Telephone Encounter (Signed)
Med refill request: Valtrex 1000mg  tab PO daily Last AEX: 11/09/22/ ML Next AEX: Not scheduled  Last MMG (if hormonal med) N/A  Hx HSV 2  Refill authorized: Please Advise?

## 2022-11-15 ENCOUNTER — Other Ambulatory Visit: Payer: Commercial Managed Care - PPO

## 2022-11-15 DIAGNOSIS — Z30431 Encounter for routine checking of intrauterine contraceptive device: Secondary | ICD-10-CM

## 2022-11-15 DIAGNOSIS — Z01419 Encounter for gynecological examination (general) (routine) without abnormal findings: Secondary | ICD-10-CM

## 2022-11-16 DIAGNOSIS — Z6828 Body mass index (BMI) 28.0-28.9, adult: Secondary | ICD-10-CM | POA: Diagnosis not present

## 2022-11-16 DIAGNOSIS — Q761 Klippel-Feil syndrome: Secondary | ICD-10-CM | POA: Diagnosis not present

## 2022-11-16 DIAGNOSIS — F1120 Opioid dependence, uncomplicated: Secondary | ICD-10-CM | POA: Diagnosis not present

## 2022-11-16 DIAGNOSIS — M5412 Radiculopathy, cervical region: Secondary | ICD-10-CM | POA: Diagnosis not present

## 2022-11-16 LAB — CBC WITH DIFFERENTIAL/PLATELET
Basophils Absolute: 0.1 10*3/uL (ref 0.0–0.2)
Basos: 1 %
EOS (ABSOLUTE): 0.1 10*3/uL (ref 0.0–0.4)
Eos: 2 %
Hematocrit: 37.2 % (ref 34.0–46.6)
Hemoglobin: 12.5 g/dL (ref 11.1–15.9)
Immature Grans (Abs): 0 10*3/uL (ref 0.0–0.1)
Immature Granulocytes: 1 %
Lymphocytes Absolute: 2.9 10*3/uL (ref 0.7–3.1)
Lymphs: 34 %
MCH: 31.6 pg (ref 26.6–33.0)
MCHC: 33.6 g/dL (ref 31.5–35.7)
MCV: 94 fL (ref 79–97)
Monocytes Absolute: 0.6 10*3/uL (ref 0.1–0.9)
Monocytes: 7 %
Neutrophils Absolute: 4.7 10*3/uL (ref 1.4–7.0)
Neutrophils: 55 %
Platelets: 294 10*3/uL (ref 150–450)
RBC: 3.96 x10E6/uL (ref 3.77–5.28)
RDW: 12.4 % (ref 11.7–15.4)
WBC: 8.4 10*3/uL (ref 3.4–10.8)

## 2022-11-16 LAB — COMPREHENSIVE METABOLIC PANEL
ALT: 15 IU/L (ref 0–32)
AST: 16 IU/L (ref 0–40)
Albumin/Globulin Ratio: 1.8 (ref 1.2–2.2)
Albumin: 4.2 g/dL (ref 3.8–4.9)
Alkaline Phosphatase: 108 IU/L (ref 44–121)
BUN/Creatinine Ratio: 15 (ref 9–23)
BUN: 13 mg/dL (ref 6–24)
Bilirubin Total: 0.3 mg/dL (ref 0.0–1.2)
CO2: 20 mmol/L (ref 20–29)
Calcium: 9.1 mg/dL (ref 8.7–10.2)
Chloride: 106 mmol/L (ref 96–106)
Creatinine, Ser: 0.87 mg/dL (ref 0.57–1.00)
Globulin, Total: 2.4 g/dL (ref 1.5–4.5)
Glucose: 87 mg/dL (ref 70–99)
Potassium: 3.9 mmol/L (ref 3.5–5.2)
Sodium: 143 mmol/L (ref 134–144)
Total Protein: 6.6 g/dL (ref 6.0–8.5)
eGFR: 79 mL/min/{1.73_m2} (ref >59)

## 2022-11-16 LAB — LIPID PANEL
Chol/HDL Ratio: 3.1 ratio (ref 0.0–4.4)
Cholesterol, Total: 188 mg/dL (ref 100–199)
HDL: 61 mg/dL (ref >39)
LDL Chol Calc (NIH): 101 mg/dL — ABNORMAL HIGH (ref 0–99)
Triglycerides: 153 mg/dL — ABNORMAL HIGH (ref 0–149)
VLDL Cholesterol Cal: 26 mg/dL (ref 5–40)

## 2022-11-16 LAB — TSH: TSH: 3.84 u[IU]/mL (ref 0.450–4.500)

## 2022-11-16 LAB — FSH/LH
FSH: 39.8 m[IU]/mL
LH: 30.7 m[IU]/mL

## 2022-11-16 LAB — VITAMIN D 25 HYDROXY (VIT D DEFICIENCY, FRACTURES): Vit D, 25-Hydroxy: 34.3 ng/mL (ref 30.0–100.0)

## 2022-11-19 DIAGNOSIS — G4733 Obstructive sleep apnea (adult) (pediatric): Secondary | ICD-10-CM | POA: Diagnosis not present

## 2022-11-24 ENCOUNTER — Other Ambulatory Visit (HOSPITAL_COMMUNITY): Payer: Self-pay

## 2022-11-25 ENCOUNTER — Encounter: Payer: Self-pay | Admitting: Obstetrics & Gynecology

## 2022-11-30 DIAGNOSIS — Z1231 Encounter for screening mammogram for malignant neoplasm of breast: Secondary | ICD-10-CM | POA: Diagnosis not present

## 2022-12-05 ENCOUNTER — Encounter: Payer: Self-pay | Admitting: Obstetrics & Gynecology

## 2022-12-06 ENCOUNTER — Other Ambulatory Visit (HOSPITAL_COMMUNITY): Payer: Self-pay

## 2022-12-08 ENCOUNTER — Encounter: Payer: Self-pay | Admitting: Obstetrics & Gynecology

## 2022-12-08 ENCOUNTER — Other Ambulatory Visit (HOSPITAL_COMMUNITY): Payer: Self-pay

## 2022-12-08 DIAGNOSIS — B009 Herpesviral infection, unspecified: Secondary | ICD-10-CM

## 2022-12-09 ENCOUNTER — Other Ambulatory Visit (HOSPITAL_COMMUNITY): Payer: Self-pay

## 2022-12-09 MED ORDER — OXYCODONE-ACETAMINOPHEN 5-325 MG PO TABS
1.0000 | ORAL_TABLET | Freq: Three times a day (TID) | ORAL | 0 refills | Status: DC | PRN
  Filled 2023-02-10: qty 48, 16d supply, fill #0
  Filled 2023-02-10: qty 42, 14d supply, fill #0

## 2022-12-09 MED ORDER — OXYCODONE-ACETAMINOPHEN 5-325 MG PO TABS
1.0000 | ORAL_TABLET | Freq: Three times a day (TID) | ORAL | 0 refills | Status: DC | PRN
  Filled 2022-12-14: qty 90, 30d supply, fill #0

## 2022-12-09 MED ORDER — ACYCLOVIR 5 % EX OINT
1.0000 | TOPICAL_OINTMENT | CUTANEOUS | 2 refills | Status: DC
  Filled 2022-12-09: qty 30, 5d supply, fill #0
  Filled 2022-12-12: qty 30, 7d supply, fill #0
  Filled 2023-02-07: qty 30, 7d supply, fill #1
  Filled 2023-07-28: qty 30, 7d supply, fill #2

## 2022-12-09 MED ORDER — OXYCODONE-ACETAMINOPHEN 5-325 MG PO TABS
1.0000 | ORAL_TABLET | Freq: Three times a day (TID) | ORAL | 0 refills | Status: DC | PRN
  Filled 2023-01-10: qty 9, 3d supply, fill #0
  Filled 2023-01-12: qty 81, 27d supply, fill #0

## 2022-12-09 NOTE — Telephone Encounter (Signed)
Last AEX 11/09/2022--recall placed for 2025.   Last Rx'd in 2021.  Rx pend.

## 2022-12-12 ENCOUNTER — Other Ambulatory Visit (HOSPITAL_COMMUNITY): Payer: Self-pay

## 2022-12-13 ENCOUNTER — Other Ambulatory Visit (HOSPITAL_COMMUNITY): Payer: Self-pay

## 2022-12-14 ENCOUNTER — Other Ambulatory Visit (HOSPITAL_COMMUNITY): Payer: Self-pay

## 2022-12-16 ENCOUNTER — Ambulatory Visit (INDEPENDENT_AMBULATORY_CARE_PROVIDER_SITE_OTHER): Payer: Commercial Managed Care - PPO | Admitting: Medical

## 2022-12-16 ENCOUNTER — Other Ambulatory Visit (HOSPITAL_COMMUNITY): Payer: Self-pay

## 2022-12-16 ENCOUNTER — Encounter: Payer: Self-pay | Admitting: Medical

## 2022-12-16 VITALS — BP 110/70 | HR 76 | Ht 64.0 in | Wt 164.2 lb

## 2022-12-16 DIAGNOSIS — L255 Unspecified contact dermatitis due to plants, except food: Secondary | ICD-10-CM

## 2022-12-16 DIAGNOSIS — L299 Pruritus, unspecified: Secondary | ICD-10-CM | POA: Diagnosis not present

## 2022-12-16 MED ORDER — METHYLPREDNISOLONE SODIUM SUCC 125 MG IJ SOLR
125.0000 mg | Freq: Once | INTRAMUSCULAR | Status: AC
  Administered 2022-12-16: 125 mg via INTRAMUSCULAR

## 2022-12-16 MED ORDER — PREDNISONE 10 MG PO TABS
ORAL_TABLET | ORAL | 0 refills | Status: AC
  Filled 2022-12-16: qty 21, 6d supply, fill #0

## 2022-12-16 MED ORDER — TRIAMCINOLONE ACETONIDE 0.1 % EX CREA
1.0000 | TOPICAL_CREAM | Freq: Two times a day (BID) | CUTANEOUS | 0 refills | Status: AC
  Filled 2022-12-16: qty 45, 23d supply, fill #0

## 2022-12-16 NOTE — Progress Notes (Signed)
Subjective:  Kelly [name] is a 57 y.o. female who presents for Chief Complaint  Patient presents with   Itching    Whole body, but upper mostly. VIsible rash. Cleared trees on Sunday and started Monday. Using hydrocortisone cream and taking benadryl at night. Took Zyrtec this am.      Here for itching.   Was doing yard work this past weekend about 6 days ago, itching and rash started 5 days ago.    Already using hydrocortisone cream OTC and oral benadryl.   Does have hx/o poison ivy reaction but this rash is like small dots, not the typical poison ivy rash.  No other aggravating or relieving factors.    No other c/o.  The following portions of the patient's history were reviewed and updated as appropriate: allergies, current medications, past family history, past medical history, past social history, past surgical history and problem list.  ROS Otherwise as in subjective above    Objective: BP 110/70   Pulse 76   Ht 5\' 4"  (1.626 m)   Wt 164 lb 3.2 oz (74.5 kg)   BMI 28.18 kg/m   General appearance: alert, no distress, well developed, well nourished Skin: faint maculopapular pink/red rash on bilat forearms and antecubital regions    Assessment: Encounter Diagnoses  Name Primary?   Itching Yes   Plant dermatitis      Plan: Discussed symptoms and concerns.  She agreed to Solu-Medrol 1.5 mg IM injection today.  Begin triamcinolone cream, continue antihistamine by mouth the next 2 days.  If much worse over the weekend or if not significantly improved within the next 48 hours can also use the prednisone oral tablet taper.  Avoid hot showers next few days.  Avoid reexposure  [name] was seen today for itching.  Diagnoses and all orders for this visit:  Itching  Plant dermatitis  Other orders -     triamcinolone cream (KENALOG) 0.1 %; Apply 1 Application topically 2 (two) times daily. -     predniSONE (DELTASONE) 10 MG tablet; Take 6 tablets (60 mg total) by mouth daily for  1 day, THEN 5 tablets (50 mg total) daily for 1 day, THEN 4 tablets (40 mg total) daily for 1 day, THEN 3 tablets (30 mg total) daily for 1 day, THEN 2 tablets (20 mg total) daily for 1 day, THEN 1 tablet (10 mg total) daily for 1 day.    Follow up: prn

## 2022-12-16 NOTE — Addendum Note (Signed)
Addended by: Debbrah Alar F on: 12/16/2022 10:10 AM   Modules accepted: Orders

## 2022-12-19 DIAGNOSIS — G4733 Obstructive sleep apnea (adult) (pediatric): Secondary | ICD-10-CM | POA: Diagnosis not present

## 2022-12-23 ENCOUNTER — Other Ambulatory Visit (HOSPITAL_COMMUNITY): Payer: Self-pay

## 2022-12-23 ENCOUNTER — Ambulatory Visit (INDEPENDENT_AMBULATORY_CARE_PROVIDER_SITE_OTHER): Payer: Commercial Managed Care - PPO | Admitting: Nurse Practitioner

## 2022-12-23 ENCOUNTER — Encounter: Payer: Self-pay | Admitting: Nurse Practitioner

## 2022-12-23 VITALS — BP 116/68 | HR 99 | Temp 97.7°F | Resp 16 | Ht 64.0 in | Wt 164.4 lb

## 2022-12-23 DIAGNOSIS — E663 Overweight: Secondary | ICD-10-CM

## 2022-12-23 DIAGNOSIS — K219 Gastro-esophageal reflux disease without esophagitis: Secondary | ICD-10-CM

## 2022-12-23 DIAGNOSIS — M542 Cervicalgia: Secondary | ICD-10-CM

## 2022-12-23 DIAGNOSIS — Z87442 Personal history of urinary calculi: Secondary | ICD-10-CM | POA: Diagnosis not present

## 2022-12-23 DIAGNOSIS — E782 Mixed hyperlipidemia: Secondary | ICD-10-CM | POA: Diagnosis not present

## 2022-12-23 DIAGNOSIS — F17200 Nicotine dependence, unspecified, uncomplicated: Secondary | ICD-10-CM | POA: Diagnosis not present

## 2022-12-23 DIAGNOSIS — G43709 Chronic migraine without aura, not intractable, without status migrainosus: Secondary | ICD-10-CM | POA: Diagnosis not present

## 2022-12-23 DIAGNOSIS — I10 Essential (primary) hypertension: Secondary | ICD-10-CM | POA: Diagnosis not present

## 2022-12-23 MED ORDER — OMEPRAZOLE 20 MG PO CPDR
20.0000 mg | DELAYED_RELEASE_CAPSULE | Freq: Every day | ORAL | 0 refills | Status: DC
Start: 2022-12-23 — End: 2023-02-07
  Filled 2022-12-23: qty 30, 30d supply, fill #0

## 2022-12-23 NOTE — Assessment & Plan Note (Signed)
Continue working lifestyle modifications.  Patient currently on phentermine through her GYN

## 2022-12-23 NOTE — Assessment & Plan Note (Signed)
Patient is on topiramate but has no trouble.  Does not use over-the-counter heartburn medicine very often

## 2022-12-23 NOTE — Assessment & Plan Note (Signed)
History of surgery and is followed by pain clinic on Percocet.  Continue medication as prescribed follow-up with pain clinic as recommended

## 2022-12-23 NOTE — Assessment & Plan Note (Signed)
Patient is followed by neurology does topiramate along with Botox injections patient has both Nurtec and Ubrelvy for abortive therapy.  Continue following with neurology as recommended take medication as prescribed

## 2022-12-23 NOTE — Progress Notes (Signed)
New Patient Office Visit  Subjective    Patient ID: Kelly [name], female    DOB: 11-09-1965  Age: 57 y.o. MRN: [id number]  CC:  Chief Complaint  Patient presents with   Establish Care    HPI Kelly [name] presents to establish care  HTN: states that she is on amlodipine and tolerates it well but does have some swelling  Chronic pain: Celedonio Miyamoto percocet's and every 3 months. State that they do the injections also   HLD: on Crestor 20mg . States that she take it every other night as she does have some myalgias. She is followed by Cardiology and has an extensive family history of heart disease  Migraine: states that she sees neurology and is on botox and topirate. Has either nurtec or ubrevly for abortive therapy. States that she has had several this week but that is unusual.  States that she does not have aura and in the front of the head can switch sides. Light sound and nausea   GERD: controled with diet alone   OSA: through [facility] and wears CPAP most nights. States that most nights that she feels rested. Snores without the cpap  Colonoscopy: 2021 with Dr. Loreta Ave repeat 5 years   Mammogram: solias done last month 11/30/2022  Pap smear: 11/09/2022 normal   Tdap: 2019 Flu: completed for the season  Covid: pfizer Pna: too young Shingles : UTD  Raynauds: is seeing rheumatology and is amlodipine   Nosey stomach: states that she has a lot of gas. States that she has talked to her GI doctor about it. States that she will have tums and gas pills. That has not helped. States that she has done probiotics and fiber without good relief.     Outpatient Encounter Medications as of 12/23/2022  Medication Sig   acyclovir ointment (ZOVIRAX) 5 % Apply 1 Application topically every 3 (three) hour for 5-7 days   amLODipine (NORVASC) 5 MG tablet Take 1 tablet (5 mg total) by mouth daily.   aspirin EC 81 MG tablet Take 1 tablet (81 mg total) by mouth daily. Swallow whole.    botulinum toxin Type A (BOTOX) 200 units injection Provider to inject 155 units into the muscles of the head and neck every 12 weeks. Discard remainder.   cyclobenzaprine (FLEXERIL) 5 MG tablet Take 1 tablet (5 mg total) by mouth 3 (three) times daily as needed for muscle spasm   fluticasone (FLONASE) 50 MCG/ACT nasal spray Place 2 sprays into both nostrils daily.   levonorgestrel (MIRENA) 20 MCG/24HR IUD 1 each by Intrauterine route once.   Multiple Vitamin (MULTIVITAMIN WITH MINERALS) TABS Take 1 tablet by mouth daily.   omeprazole (PRILOSEC) 20 MG capsule Take 1 capsule (20 mg total) by mouth daily.   oxyCODONE-acetaminophen (PERCOCET/ROXICET) 5-325 MG tablet Take 1 tablet by mouth every 8 hours as needed for pain   oxyCODONE-acetaminophen (PERCOCET/ROXICET) 5-325 MG tablet Take 1 tablet by mouth every 8 (eight) hours as needed for pain   [START ON 02/11/2023] oxyCODONE-acetaminophen (PERCOCET/ROXICET) 5-325 MG tablet Take 1 tablet by mouth every 8 (eight) hours as needed for pain   [START ON 01/12/2023] oxyCODONE-acetaminophen (PERCOCET/ROXICET) 5-325 MG tablet Take 1 tablet by mouth every 8 (eight) hours as needed for pain   phentermine (ADIPEX-P) 37.5 MG tablet Take 1 tablet (37.5 mg total) by mouth daily before breakfast.   pregabalin (LYRICA) 100 MG capsule Take 1 capsule (100 mg total) by mouth 2 (two) times daily.   rosuvastatin (  CRESTOR) 20 MG tablet Take 1 tablet (20 mg total) by mouth daily.   topiramate (TOPAMAX) 50 MG tablet Take 1 tablet (50 mg total) by mouth 2 (two) times daily.   valACYclovir (VALTREX) 1000 MG tablet Take 1 tablet (1,000 mg total) by mouth daily.   triamcinolone cream (KENALOG) 0.1 % Apply 1 Application topically 2 (two) times daily. (Patient not taking: Reported on 12/23/2022)   No facility-administered encounter medications on file as of 12/23/2022.    Past Medical History:  Diagnosis Date   Anal fistula    intersphincteric    Anemia    Anticardiolipin  antibody positive    Cervicalgia    followed by dr Marikay Alar   Depression    History of herpes genitalis    many yrs ago   History of kidney stones    Hyperlipidemia    Hypertension    followed by pcp   (11-25-2019  per pt had stress test approx. 2018 w/ Dr Donnie Aho, told normal)   Migraines    neurologist--- dr Lucia Gaskins   OSA on CPAP    TMJ (dislocation of temporomandibular joint)    11-25-2019  jaw pops, no appliance   Wears glasses     Past Surgical History:  Procedure Laterality Date   ANTERIOR CERVICAL DECOMP/DISCECTOMY FUSION  12/ 2020   @[facility]   C4 - 6   CESAREAN SECTION     X3   LAST ONE 11-30-2004   CYSTOSCOPY W/ RETROGRADES Right 10/29/2012   Procedure: CYSTOSCOPY WITH RETROGRADE PYELOGRAM and stent placement;  Surgeon: Martina Sinner, MD;  Location: [facility];  Service: Urology;  Laterality: Right;  rt stent placement , rt retrograde and cysto    CYSTOSCOPY W/ URETERAL STENT PLACEMENT Right 11/23/2012   Procedure: CYSTOSCOPY WITH STENT REPLACEMENT;  Surgeon: Sebastian Ache, MD;  Location: [facility];  Service: Urology;  Laterality: Right;   CYSTOSCOPY/RETROGRADE/URETEROSCOPY/STONE EXTRACTION WITH BASKET Right 11/23/2012   Procedure: CYSTOSCOPY/RETROGRADE/URETEROSCOPY/STONE EXTRACTION WITH BASKET;  Surgeon: Sebastian Ache, MD;  Location: [facility];  Service: Urology;  Laterality: Right;   EVALUATION UNDER ANESTHESIA WITH ANAL FISSUROTOMY N/A 01/23/2020   Procedure: ANAL EXAM UNDER ANESTHESIA, FISTULOTOMY, SKIN TAG EXCISION;  Surgeon: Romie Levee, MD;  Location: [facility];  Service: General;  Laterality: N/A;   INTRAUTERINE DEVICE (IUD) INSERTION     mirena inserted 07-11-18   SKIN CANCER DESTRUCTION      Family History  Problem Relation Age of Onset   Heart disease Mother 10       died suddenly of MI   Heart attack Mother    Diabetes Father    Hypertension Father    Cancer Father         colon cancer   Kidney Stones Father    Diabetes Sister    Stroke Sister    Heart disease Maternal Uncle 55       died of MI   Heart disease Maternal Uncle    Breast cancer Maternal Grandmother    Mental illness Son    Healthy Daughter    Healthy Daughter    Sleep apnea Neg Hx     Social History   Socioeconomic History   Marital status: Single    Spouse name: Not on file   Number of children: 3   Years of education: 13   Highest education level: Not on file  Occupational History   Occupation: patient advocate     Comment: [employer],  pt advocate  Tobacco Use   Smoking status: Former    Packs/day: 0.50    Years: 15.00    Additional pack years: 0.00    Total pack years: 7.50    Types: E-cigarettes, Cigarettes    Quit date: 08/22/2020    Years since quitting: 2.3    Passive exposure: Never   Smokeless tobacco: Never   Tobacco comments:    vape  Vaping Use   Vaping Use: Every day  Substance and Sexual Activity   Alcohol use: Not Currently   Drug use: Never   Sexual activity: Yes    Partners: Male    Birth control/protection: I.U.D.    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  Respiratory:  Negative for shortness of breath.   Cardiovascular:  Positive for leg swelling. Negative for chest pain.  Gastrointestinal:  Negative for abdominal  pain, blood in stool, constipation, diarrhea, nausea and vomiting.       BM daily   Genitourinary:  Negative for dysuria and hematuria.  Neurological:  Positive for headaches. Negative for tingling.  Psychiatric/Behavioral:  Negative for hallucinations and suicidal ideas.         Objective    BP 116/68   Pulse 99   Temp 97.7 F (36.5 C)   Resp 16   Ht 5\' 4"  (1.626 m)   Wt 164 lb 6 oz (74.6 kg)   SpO2 99%   BMI 28.21 kg/m   Physical Exam Vitals and nursing note reviewed.  Constitutional:      Appearance: Normal appearance.  HENT:     Right Ear: Tympanic membrane, ear canal and external ear normal.     Left Ear: Tympanic membrane, ear canal and external ear normal.     Mouth/Throat:     Mouth: Mucous membranes are moist.     Pharynx: Oropharynx is clear.  Eyes:     Extraocular Movements: Extraocular movements intact.     Pupils: Pupils are equal, round, and reactive to light.     Comments: Wears glasses  Cardiovascular:     Rate and Rhythm: Normal rate and regular rhythm.     Pulses: Normal pulses.     Heart sounds: Normal heart sounds.  Pulmonary:     Effort: Pulmonary effort is normal.     Breath sounds: Normal breath sounds.  Musculoskeletal:     Right lower leg: No edema.     Left lower leg: No edema.  Lymphadenopathy:     Cervical: No cervical adenopathy.  Skin:    Comments: Spider veins to lower extremities   Neurological:     Mental Status: She is alert.  Psychiatric:        Mood and  Affect: Mood normal.        Behavior: Behavior normal.        Thought Content: Thought content normal.        Judgment: Judgment normal.         Assessment & Plan:   Problem List Items Addressed This Visit       Cardiovascular and Mediastinum   Hypertension - Primary    Patient currently maintained on amlodipine 5 mg daily.  Was on metoprolol previously rheumatologist as patient does have Raynaud's syndrome.  She does have some mild swelling that will go down  with elevation the next day.  Will continue amlodipine 5 mg for now      Chronic migraine without aura without status migrainosus, not intractable    Patient is followed by neurology does topiramate along with Botox injections patient has both Nurtec and Ubrelvy for abortive therapy.  Continue following with neurology as recommended take medication as prescribed        Digestive   GERD    Currently maintained on diet alone patient does have a "loud stomach" extremity veins will do omeprazole 20 mg 4 months if this is beneficial low likelihood it will help the discussed this with patient      Relevant Medications   omeprazole (PRILOSEC) 20 MG capsule     Other   NECK PAIN    History of surgery and is followed by pain clinic on Percocet.  Continue medication as prescribed follow-up with pain clinic as recommended      Hyperlipidemia    Patient is followed by cardiology on rosuvastatin 20 mg.  Tolerates medication decently well takes crestor every other night.  Patient's LDL was 101 and triglycerides 159.  Continue taking medication as prescribed continue work on healthy lifestyle medications continue following cardiology as recommended        Current smoker    Former traditional cigarette smoker current electronic cigarette smoker.  Check urine microscopy to rule out hematuria      Relevant Orders   Urine Microscopic   History of renal stone    Patient is on topiramate but has no trouble.  Does not use over-the-counter heartburn medicine very often      Overweight    Continue working lifestyle modifications.  Patient currently on phentermine through her GYN       Return in about 6 months (around 06/25/2023) for CPE and Labs.   Audria Nine, NP

## 2022-12-23 NOTE — Assessment & Plan Note (Signed)
Patient is followed by cardiology on rosuvastatin 20 mg.  Tolerates medication decently well takes crestor every other night.  Patient's LDL was 101 and triglycerides 159.  Continue taking medication as prescribed continue work on healthy lifestyle medications continue following cardiology as recommended

## 2022-12-23 NOTE — Assessment & Plan Note (Signed)
Former traditional cigarette smoker current electronic cigarette smoker.  Check urine microscopy to rule out hematuria

## 2022-12-23 NOTE — Patient Instructions (Signed)
Nice to see you today I will be in touch with the urine test once I have it Follow up with me in 6 months for a true physical and labs, sooner if you need me  We will do the omeprazole for a month to see if it helps

## 2022-12-23 NOTE — Assessment & Plan Note (Signed)
Currently maintained on diet alone patient does have a "loud stomach" extremity veins will do omeprazole 20 mg 4 months if this is beneficial low likelihood it will help the discussed this with patient

## 2022-12-23 NOTE — Assessment & Plan Note (Signed)
Patient currently maintained on amlodipine 5 mg daily.  Was on metoprolol previously rheumatologist as patient does have Raynaud's syndrome.  She does have some mild swelling that will go down with elevation the next day.  Will continue amlodipine 5 mg for now

## 2022-12-24 LAB — URINALYSIS, MICROSCOPIC ONLY
Hyaline Cast: NONE SEEN /LPF
RBC / HPF: NONE SEEN /HPF (ref 0–2)
WBC, UA: NONE SEEN /HPF (ref 0–5)

## 2023-01-06 ENCOUNTER — Other Ambulatory Visit (HOSPITAL_COMMUNITY): Payer: Self-pay

## 2023-01-10 ENCOUNTER — Other Ambulatory Visit (HOSPITAL_COMMUNITY): Payer: Self-pay

## 2023-01-11 ENCOUNTER — Other Ambulatory Visit (HOSPITAL_COMMUNITY): Payer: Self-pay

## 2023-01-12 ENCOUNTER — Other Ambulatory Visit (HOSPITAL_COMMUNITY): Payer: Self-pay

## 2023-01-12 ENCOUNTER — Other Ambulatory Visit: Payer: Self-pay

## 2023-01-13 ENCOUNTER — Other Ambulatory Visit: Payer: Self-pay

## 2023-01-13 ENCOUNTER — Other Ambulatory Visit (HOSPITAL_COMMUNITY): Payer: Self-pay

## 2023-01-17 ENCOUNTER — Other Ambulatory Visit (HOSPITAL_COMMUNITY): Payer: Self-pay

## 2023-01-18 ENCOUNTER — Other Ambulatory Visit: Payer: Self-pay

## 2023-01-18 DIAGNOSIS — G4733 Obstructive sleep apnea (adult) (pediatric): Secondary | ICD-10-CM | POA: Diagnosis not present

## 2023-01-19 ENCOUNTER — Other Ambulatory Visit: Payer: Self-pay

## 2023-01-19 ENCOUNTER — Other Ambulatory Visit (HOSPITAL_COMMUNITY): Payer: Self-pay

## 2023-01-23 ENCOUNTER — Other Ambulatory Visit (HOSPITAL_COMMUNITY): Payer: Self-pay

## 2023-01-24 ENCOUNTER — Ambulatory Visit (INDEPENDENT_AMBULATORY_CARE_PROVIDER_SITE_OTHER): Payer: Commercial Managed Care - PPO | Admitting: Neurology

## 2023-01-24 DIAGNOSIS — G43709 Chronic migraine without aura, not intractable, without status migrainosus: Secondary | ICD-10-CM | POA: Diagnosis not present

## 2023-01-24 MED ORDER — ONABOTULINUMTOXINA 200 UNITS IJ SOLR
155.0000 [IU] | Freq: Once | INTRAMUSCULAR | Status: AC
Start: 2023-01-24 — End: 2023-01-24
  Administered 2023-01-24: 155 [IU] via INTRAMUSCULAR

## 2023-01-24 NOTE — Progress Notes (Signed)
Consent Form Botulism Toxin Injection For Chronic Migraine  01/24/2023: doong great, daughter graduated HS and will see me for migraines. minimum > 60% - 70 improvement in migraine and headache frequency  08/08/2022: stable doing well, minimum > 60% - 70 improvement in migraine and headache frequency  05/12/2022: She will get migrianes 2 weeks after botox when the botox has worn off. This has happened a few times, takes nurtec and then kicks in. after that she does great no more than 4 migraine days a month and no headache days. At baseline she had daily headaches prior to botox.   02/17/2022: >>90% improvement in migraine frequency.  Reviewed orally with patient, additionally signature is on file:  Botulism toxin has been approved by the Federal drug administration for treatment of chronic migraine. Botulism toxin does not cure chronic migraine and it may not be effective in some patients.  The administration of botulism toxin is accomplished by injecting a small amount of toxin into the muscles of the neck and head. Dosage must be titrated for each individual. Any benefits resulting from botulism toxin tend to wear off after 3 months with a repeat injection required if benefit is to be maintained. Injections are usually done every 3-4 months with maximum effect peak achieved by about 2 or 3 weeks. Botulism toxin is expensive and you should be sure of what costs you will incur resulting from the injection.  The side effects of botulism toxin use for chronic migraine may include:   -Transient, and usually mild, facial weakness with facial injections  -Transient, and usually mild, head or neck weakness with head/neck injections  -Reduction or loss of forehead facial animation due to forehead muscle weakness  -Eyelid drooping  -Dry eye  -Pain at the site of injection or bruising at the site of injection  -Double vision  -Potential unknown long term risks  Contraindications: You should not have  Botox if you are pregnant, nursing, allergic to albumin, have an infection, skin condition, or muscle weakness at the site of the injection, or have myasthenia gravis, Lambert-Eaton syndrome, or ALS.  It is also possible that as with any injection, there may be an allergic reaction or no effect from the medication. Reduced effectiveness after repeated injections is sometimes seen and rarely infection at the injection site may occur. All care will be taken to prevent these side effects. If therapy is given over a long time, atrophy and wasting in the muscle injected may occur. Occasionally the patient's become refractory to treatment because they develop antibodies to the toxin. In this event, therapy needs to be modified.  I have read the above information and consent to the administration of botulism toxin.    BOTOX PROCEDURE NOTE FOR MIGRAINE HEADACHE    Contraindications and precautions discussed with patient(above). Aseptic procedure was observed and patient tolerated procedure. Procedure performed by Dr. Artemio Aly  The condition has existed for more than 6 months, and pt does not have a diagnosis of ALS, Myasthenia Gravis or Lambert-Eaton Syndrome.  Risks and benefits of injections discussed and pt agrees to proceed with the procedure.  Written consent obtained  These injections are medically necessary. Pt  receives good benefits from these injections. These injections do not cause sedations or hallucinations which the oral therapies may cause.  Description of procedure:  The patient was placed in a sitting position. The standard protocol was used for Botox as follows, with 5 units of Botox injected at each site:   -Procerus muscle,  midline injection  -Corrugator muscle, bilateral injection  -Frontalis muscle, bilateral injection, with 2 sites each side, medial injection was performed in the upper one third of the frontalis muscle, in the region vertical from the medial inferior edge  of the superior orbital rim. The lateral injection was again in the upper one third of the forehead vertically above the lateral limbus of the cornea, 1.5 cm lateral to the medial injection site.  -Temporalis muscle injection, 4 sites, bilaterally. The first injection was 3 cm above the tragus of the ear, second injection site was 1.5 cm to 3 cm up from the first injection site in line with the tragus of the ear. The third injection site was 1.5-3 cm forward between the first 2 injection sites. The fourth injection site was 1.5 cm posterior to the second injection site.   -Occipitalis muscle injection, 3 sites, bilaterally. The first injection was done one half way between the occipital protuberance and the tip of the mastoid process behind the ear. The second injection site was done lateral and superior to the first, 1 fingerbreadth from the first injection. The third injection site was 1 fingerbreadth superiorly and medially from the first injection site.  -Cervical paraspinal muscle injection, 2 sites, bilateral knee first injection site was 1 cm from the midline of the cervical spine, 3 cm inferior to the lower border of the occipital protuberance. The second injection site was 1.5 cm superiorly and laterally to the first injection site.  -Trapezius muscle injection was performed at 3 sites, bilaterally. The first injection site was in the upper trapezius muscle halfway between the inflection point of the neck, and the acromion. The second injection site was one half way between the acromion and the first injection site. The third injection was done between the first injection site and the inflection point of the neck.   Will return for repeat injection in 3 months.   155 units of Botox was used, 45u Botox not injected was wasted. The patient tolerated the procedure well, there were no complications of the above procedure.

## 2023-01-24 NOTE — Progress Notes (Signed)
Botox- 200 units x 1 vial Lot: [id number] Expiration: 02/2025 NDC: 2725-3664-40  Bacteriostatic 0.9% Sodium Chloride- 4 mL  Lot: [id number] Expiration: 11/21/2023 NDC: 9563-8756-43  Dx: P29.518 S/P  Witnessed by S. Young Charity fundraiser

## 2023-02-07 ENCOUNTER — Other Ambulatory Visit (HOSPITAL_COMMUNITY): Payer: Self-pay

## 2023-02-07 ENCOUNTER — Other Ambulatory Visit: Payer: Self-pay

## 2023-02-07 ENCOUNTER — Other Ambulatory Visit: Payer: Self-pay | Admitting: Nurse Practitioner

## 2023-02-07 DIAGNOSIS — K219 Gastro-esophageal reflux disease without esophagitis: Secondary | ICD-10-CM

## 2023-02-08 NOTE — Telephone Encounter (Signed)
Can we see if the medication help and if she would like to continue for another two months or stop using the medication

## 2023-02-09 ENCOUNTER — Other Ambulatory Visit (HOSPITAL_COMMUNITY): Payer: Self-pay

## 2023-02-09 NOTE — Telephone Encounter (Signed)
Left message to return call to our office.  

## 2023-02-10 ENCOUNTER — Telehealth: Payer: Self-pay | Admitting: Nurse Practitioner

## 2023-02-10 ENCOUNTER — Other Ambulatory Visit (HOSPITAL_COMMUNITY): Payer: Self-pay

## 2023-02-10 MED ORDER — OMEPRAZOLE 20 MG PO CPDR
20.0000 mg | DELAYED_RELEASE_CAPSULE | Freq: Every day | ORAL | 2 refills | Status: DC
Start: 2023-02-10 — End: 2023-02-13
  Filled 2023-02-10: qty 30, 30d supply, fill #0

## 2023-02-10 NOTE — Telephone Encounter (Signed)
Patient would like to know if she can have medication  omeprazole (PRILOSEC) 20 MG capsuledosage changed to 40 mg,to see if that will help her?

## 2023-02-10 NOTE — Telephone Encounter (Signed)
Spoke to pt and she stated that the medication has helped and she would like to continue on it.

## 2023-02-13 ENCOUNTER — Other Ambulatory Visit (HOSPITAL_COMMUNITY): Payer: Self-pay

## 2023-02-13 MED ORDER — OMEPRAZOLE 40 MG PO CPDR
40.0000 mg | DELAYED_RELEASE_CAPSULE | Freq: Every day | ORAL | 1 refills | Status: DC
  Filled 2023-02-13: qty 30, 30d supply, fill #0
  Filled 2023-07-28: qty 30, 30d supply, fill #1

## 2023-02-13 NOTE — Telephone Encounter (Signed)
Script updated and sent [facility]

## 2023-02-13 NOTE — Telephone Encounter (Signed)
Called pt and left a vm letting her know that the rx has been sent in.

## 2023-02-13 NOTE — Telephone Encounter (Signed)
Called patient reviewed all information and repeated back to me. Will call if any questions.  ? ?

## 2023-02-15 ENCOUNTER — Other Ambulatory Visit (HOSPITAL_COMMUNITY): Payer: Self-pay

## 2023-02-15 DIAGNOSIS — Q761 Klippel-Feil syndrome: Secondary | ICD-10-CM | POA: Diagnosis not present

## 2023-02-15 DIAGNOSIS — F1120 Opioid dependence, uncomplicated: Secondary | ICD-10-CM | POA: Diagnosis not present

## 2023-02-15 DIAGNOSIS — Z6828 Body mass index (BMI) 28.0-28.9, adult: Secondary | ICD-10-CM | POA: Diagnosis not present

## 2023-02-15 DIAGNOSIS — M5412 Radiculopathy, cervical region: Secondary | ICD-10-CM | POA: Diagnosis not present

## 2023-02-15 MED ORDER — OXYCODONE-ACETAMINOPHEN 5-325 MG PO TABS
1.0000 | ORAL_TABLET | Freq: Three times a day (TID) | ORAL | 0 refills | Status: DC
  Filled 2023-03-10: qty 13, 5d supply, fill #0
  Filled 2023-03-10: qty 71, 23d supply, fill #0

## 2023-02-15 MED ORDER — OXYCODONE-ACETAMINOPHEN 5-325 MG PO TABS
1.0000 | ORAL_TABLET | Freq: Three times a day (TID) | ORAL | 0 refills | Status: DC | PRN
  Filled 2023-05-05: qty 84, 28d supply, fill #0

## 2023-02-15 MED ORDER — OXYCODONE-ACETAMINOPHEN 5-325 MG PO TABS
1.0000 | ORAL_TABLET | Freq: Three times a day (TID) | ORAL | 0 refills | Status: DC | PRN
  Filled 2023-04-07: qty 29, 10d supply, fill #0
  Filled 2023-04-07: qty 55, 18d supply, fill #0

## 2023-02-18 DIAGNOSIS — G4733 Obstructive sleep apnea (adult) (pediatric): Secondary | ICD-10-CM | POA: Diagnosis not present

## 2023-02-21 ENCOUNTER — Encounter: Payer: Self-pay | Admitting: Medical

## 2023-02-21 ENCOUNTER — Ambulatory Visit (INDEPENDENT_AMBULATORY_CARE_PROVIDER_SITE_OTHER): Payer: Commercial Managed Care - PPO | Admitting: Medical

## 2023-02-21 ENCOUNTER — Other Ambulatory Visit (HOSPITAL_COMMUNITY): Payer: Self-pay

## 2023-02-21 VITALS — BP 114/72 | HR 88 | Temp 99.0°F | Ht 64.0 in | Wt 167.4 lb

## 2023-02-21 DIAGNOSIS — R350 Frequency of micturition: Secondary | ICD-10-CM

## 2023-02-21 DIAGNOSIS — R309 Painful micturition, unspecified: Secondary | ICD-10-CM | POA: Diagnosis not present

## 2023-02-21 LAB — POCT URINALYSIS DIP (PROADVANTAGE DEVICE)
Bilirubin, UA: NEGATIVE
Glucose, UA: NEGATIVE mg/dL
Ketones, POC UA: NEGATIVE mg/dL
Nitrite, UA: NEGATIVE
Protein Ur, POC: 30 mg/dL — AB
Specific Gravity, Urine: 1.025
Urobilinogen, Ur: 0.2
pH, UA: 6 (ref 5.0–8.0)

## 2023-02-21 MED ORDER — NITROFURANTOIN MONOHYD MACRO 100 MG PO CAPS
100.0000 mg | ORAL_CAPSULE | Freq: Two times a day (BID) | ORAL | 0 refills | Status: DC
  Filled 2023-02-21: qty 14, 7d supply, fill #0

## 2023-02-21 NOTE — Progress Notes (Signed)
Subjective:  Kelly [name] is a 57 y.o. female who presents for Chief Complaint  Patient presents with   Urinary Frequency    Urinary frequency and pain that started yesterday.      Here for possible UTI.  Started yesterday but worse today.  She notes urinary frequency, urgency.   Pain is just starting today.   Some chills last night, but no fever.  No blood in urine.  Last UTI 09/2022.  Saw urology 02/2022 for recurrent dysuria symptoms, had cystoscopy and no abnormality found thankfully.   No other aggravating or relieving factors.    No other c/o.  The following portions of the patient's history were reviewed and updated as appropriate: allergies, current medications, past family history, past medical history, past social history, past surgical history and problem list.  ROS Otherwise as in subjective above    Objective: BP 114/72   Pulse 88   Temp 99 F (37.2 C) (Tympanic)   Ht 5\' 4"  (1.626 m)   Wt 167 lb 6.4 oz (75.9 kg)   SpO2 98%   BMI 28.73 kg/m   General appearance: alert, no distress, well developed, well nourished    Assessment: Encounter Diagnoses  Name Primary?   Urinary frequency Yes   Urinary pain      Plan: Empirically begin macrobid.  She has several antibiotic allergies. Urine culture sent. If worse symptoms in the next few days, call or recheck  [name] was seen today for urinary frequency.  Diagnoses and all orders for this visit:  Urinary frequency -     POCT Urinalysis DIP (Proadvantage Device) -     Cancel: Urine Culture -     Urine Culture  Urinary pain -     POCT Urinalysis DIP (Proadvantage Device) -     Cancel: Urine Culture  Other orders -     nitrofurantoin, macrocrystal-monohydrate, (MACROBID) 100 MG capsule; Take 1 capsule (100 mg total) by mouth 2 (two) times daily.    Follow up: prn

## 2023-02-22 LAB — URINE CULTURE

## 2023-02-26 NOTE — Progress Notes (Signed)
Results sent through MyChart

## 2023-03-06 ENCOUNTER — Other Ambulatory Visit (HOSPITAL_COMMUNITY): Payer: Self-pay

## 2023-03-06 DIAGNOSIS — L309 Dermatitis, unspecified: Secondary | ICD-10-CM | POA: Diagnosis not present

## 2023-03-06 DIAGNOSIS — L989 Disorder of the skin and subcutaneous tissue, unspecified: Secondary | ICD-10-CM | POA: Diagnosis not present

## 2023-03-06 MED ORDER — FLUOCINONIDE 0.05 % EX CREA
TOPICAL_CREAM | Freq: Two times a day (BID) | CUTANEOUS | 1 refills | Status: AC | PRN
  Filled 2023-03-06: qty 60, 30d supply, fill #0
  Filled 2023-08-04: qty 60, 30d supply, fill #1

## 2023-03-07 ENCOUNTER — Other Ambulatory Visit (HOSPITAL_COMMUNITY): Payer: Self-pay

## 2023-03-08 ENCOUNTER — Other Ambulatory Visit (HOSPITAL_COMMUNITY): Payer: Self-pay

## 2023-03-10 ENCOUNTER — Other Ambulatory Visit: Payer: Self-pay

## 2023-03-10 ENCOUNTER — Other Ambulatory Visit (HOSPITAL_COMMUNITY): Payer: Self-pay

## 2023-03-12 ENCOUNTER — Other Ambulatory Visit (HOSPITAL_COMMUNITY): Payer: Self-pay

## 2023-03-13 ENCOUNTER — Other Ambulatory Visit (HOSPITAL_COMMUNITY): Payer: Self-pay

## 2023-03-17 ENCOUNTER — Other Ambulatory Visit: Payer: Self-pay | Admitting: Oncology

## 2023-03-17 DIAGNOSIS — Z006 Encounter for examination for normal comparison and control in clinical research program: Secondary | ICD-10-CM

## 2023-03-21 ENCOUNTER — Encounter: Payer: Self-pay | Admitting: Family Medicine

## 2023-03-21 ENCOUNTER — Ambulatory Visit (INDEPENDENT_AMBULATORY_CARE_PROVIDER_SITE_OTHER): Payer: Commercial Managed Care - PPO | Admitting: Family Medicine

## 2023-03-21 VITALS — BP 118/74 | HR 61 | Temp 98.3°F | Wt 165.2 lb

## 2023-03-21 DIAGNOSIS — Z4802 Encounter for removal of sutures: Secondary | ICD-10-CM

## 2023-03-21 NOTE — Progress Notes (Signed)
   Subjective:    Patient ID: Kelly [name], female    DOB: 07-27-1966, 57 y.o.   MRN: [id number]  HPI She is here for suture removal.  Where was a suture removal from the right arm.   Review of Systems     Objective:    Physical Exam The wound is healing nicely.  Suture was removed without difficulty.     Assessment & Plan:   Visit for suture removal

## 2023-03-22 ENCOUNTER — Telehealth: Payer: Commercial Managed Care - PPO | Admitting: Nurse Practitioner

## 2023-03-22 ENCOUNTER — Other Ambulatory Visit (HOSPITAL_COMMUNITY): Payer: Self-pay

## 2023-03-22 DIAGNOSIS — N760 Acute vaginitis: Secondary | ICD-10-CM

## 2023-03-22 MED ORDER — METRONIDAZOLE 500 MG PO TABS
500.0000 mg | ORAL_TABLET | Freq: Two times a day (BID) | ORAL | 0 refills | Status: AC
Start: 2023-03-22 — End: 2023-03-29
  Filled 2023-03-22: qty 14, 7d supply, fill #0

## 2023-03-22 NOTE — Progress Notes (Signed)
E-Visit for Vaginal Symptoms  We are sorry that you are not feeling well. Here is how we plan to help! Based on what you shared with me it looks like you: May have a vaginosis due to bacteria  Vaginosis is an inflammation of the vagina that can result in discharge, itching and pain. The cause is usually a change in the normal balance of vaginal bacteria or an infection. Vaginosis can also result from reduced estrogen levels after menopause.  The most common causes of vaginosis are:   Bacterial vaginosis which results from an overgrowth of one on several organisms that are normally present in your vagina.   Yeast infections which are caused by a naturally occurring fungus called candida.   Vaginal atrophy (atrophic vaginosis) which results from the thinning of the vagina from reduced estrogen levels after menopause.   Trichomoniasis which is caused by a parasite and is commonly transmitted by sexual intercourse.  Factors that increase your risk of developing vaginosis include: Medications, such as antibiotics and steroids Uncontrolled diabetes Use of hygiene products such as bubble bath, vaginal spray or vaginal deodorant Douching Wearing damp or tight-fitting clothing Using an intrauterine device (IUD) for birth control Hormonal changes, such as those associated with pregnancy, birth control pills or menopause Sexual activity Having a sexually transmitted infection  Your treatment plan is Metronidazole or Flagyl 500mg twice a day for 7 days.  I have electronically sent this prescription into the pharmacy that you have chosen.  Be sure to take all of the medication as directed. Stop taking any medication if you develop a rash, tongue swelling or shortness of breath. Mothers who are breast feeding should consider pumping and discarding their breast milk while on these antibiotics. However, there is no consensus that infant exposure at these doses would be harmful.  Remember that  medication creams can weaken latex condoms. .   HOME CARE:  Good hygiene may prevent some types of vaginosis from recurring and may relieve some symptoms:  Avoid baths, hot tubs and whirlpool spas. Rinse soap from your outer genital area after a shower, and dry the area well to prevent irritation. Don't use scented or harsh soaps, such as those with deodorant or antibacterial action. Avoid irritants. These include scented tampons and pads. Wipe from front to back after using the toilet. Doing so avoids spreading fecal bacteria to your vagina.  Other things that may help prevent vaginosis include:  Don't douche. Your vagina doesn't require cleansing other than normal bathing. Repetitive douching disrupts the normal organisms that reside in the vagina and can actually increase your risk of vaginal infection. Douching won't clear up a vaginal infection. Use a latex condom. Both female and female latex condoms may help you avoid infections spread by sexual contact. Wear cotton underwear. Also wear pantyhose with a cotton crotch. If you feel comfortable without it, skip wearing underwear to bed. Yeast thrives in moist environments Your symptoms should improve in the next day or two.  GET HELP RIGHT AWAY IF:  You have pain in your lower abdomen ( pelvic area or over your ovaries) You develop nausea or vomiting You develop a fever Your discharge changes or worsens You have persistent pain with intercourse You develop shortness of breath, a rapid pulse, or you faint.  These symptoms could be signs of problems or infections that need to be evaluated by a medical provider now.  MAKE SURE YOU   Understand these instructions. Will watch your condition. Will get help right   away if you are not doing well or get worse.  Thank you for choosing an e-visit.  Your e-visit answers were reviewed by a board certified advanced clinical practitioner to complete your personal care plan. Depending upon the  condition, your plan could have included both over the counter or prescription medications.  Please review your pharmacy choice. Make sure the pharmacy is open so you can pick up prescription now. If there is a problem, you may contact your provider through MyChart messaging and have the prescription routed to another pharmacy.  Your safety is important to us. If you have drug allergies check your prescription carefully.   For the next 24 hours you can use MyChart to ask questions about today's visit, request a non-urgent call back, or ask for a work or school excuse. You will get an email in the next two days asking about your experience. I hope that your e-visit has been valuable and will speed your recovery.   Meds ordered this encounter  Medications   metroNIDAZOLE (FLAGYL) 500 MG tablet    Sig: Take 1 tablet (500 mg total) by mouth 2 (two) times daily for 7 days.    Dispense:  14 tablet    Refill:  0     I spent approximately 5 minutes reviewing the patient's history, current symptoms and coordinating their care today.   

## 2023-03-23 NOTE — Progress Notes (Signed)
Office Visit Note  Patient: Kelly [name]             Date of Birth: [birth year]           MRN: [id number]             PCP: Eden Emms, NP Referring: Emi Belfast, FNP Visit Date: 04/05/2023 Occupation: @GUAROCC @  Subjective:  Increased pain in hands and feet.  History of Present Illness: Kelly [name] is a 57 y.o. female with positive ANA and osteoarthritis.  She returns today after her last visit on April 06, 2022.  She states she continues to have fatigue, dry mouth and dry eyes.  She is also noticed nasal sores.  She states she has been having increased photosensitivity.  She continues to have hair loss.  She continues to have pain and discomfort in her bilateral hands and her bilateral feet.  She has not noticed any joint swelling.  She also has muscle pain.  She also experiences muscle cramps.  She continues to have pain and discomfort in her neck due to underlying disc disease.  She is intermittent trochanteric bursitis and plantar fasciitis.    Activities of Daily Living:  Patient reports morning stiffness for 1 hour.   Patient Reports nocturnal pain.  Difficulty dressing/grooming: Denies Difficulty climbing stairs: Reports Difficulty getting out of chair: Denies Difficulty using hands for taps, buttons, cutlery, and/or writing: Reports  Review of Systems  Constitutional:  Positive for fatigue.  HENT:  Positive for mouth dryness. Negative for mouth sores.        Nose sores  Eyes:  Negative for dryness.  Respiratory:  Negative for shortness of breath.   Cardiovascular:  Positive for swelling in legs/feet. Negative for chest pain and palpitations.  Gastrointestinal:  Positive for constipation and diarrhea. Negative for blood in stool.  Endocrine: Negative for increased urination.  Genitourinary:  Negative for painful urination and involuntary urination.  Musculoskeletal:  Positive for joint pain, joint pain, myalgias, morning stiffness and myalgias. Negative for  gait problem, joint swelling, muscle weakness and muscle tenderness.  Skin:  Positive for color change, hair loss and sensitivity to sunlight. Negative for rash.  Allergic/Immunologic: Negative for susceptible to infections.  Neurological:  Positive for numbness and headaches. Negative for dizziness.  Hematological:  Negative for swollen glands.  Psychiatric/Behavioral:  Negative for depressed mood and sleep disturbance. The patient is nervous/anxious.     PMFS History:  Patient Active Problem List   Diagnosis Date Noted   Ptosis of right eyelid 05/24/2022   Blurred vision 05/24/2022   Numbness and tingling in both hands 07/25/2020   Overweight 07/25/2020   Positive depression screening 07/25/2020   Stressful life event affecting family 07/12/2019   Caregiver burden 07/12/2019   Tobacco abuse counseling 07/12/2019   Protrusion of cervical intervertebral disc 04/10/2019   Spinal stenosis of cervical region 04/10/2019   Overactive bladder 11/19/2018   Photosensitization due to sun 11/02/2018   Generalized pain 11/02/2018   High risk medication use 11/02/2018   Foot pain, bilateral 05/10/2018   Heel pain, bilateral 05/10/2018   Plantar fasciitis 05/10/2018   Left knee pain 05/10/2018   Patellofemoral pain syndrome of both knees 05/10/2018   Generalized abdominal pain 04/17/2018   Flank pain 04/17/2018   Acute back pain 04/17/2018   History of renal stone 04/17/2018   Urinary frequency 04/17/2018   Laceration of left hand 01/31/2018   Decreased range of motion of finger of left hand 01/31/2018  Localized swelling on left hand 01/31/2018   Chronic migraine without aura without status migrainosus, not intractable 11/01/2016   History of migraine 06/10/2016   Diarrhea 05/20/2016   Pain in the chest 05/11/2016   Dizziness and giddiness 05/11/2016   History of hypertension 05/11/2016   Hemorrhoidal skin tag 04/07/2015   IUD (intrauterine device) in place 05/03/2013   Allergic  rhinitis 01/11/2013   Current smoker 01/11/2013   Weight gain 06/26/2012   Cervical stenosis (uterine cervix) 04/25/2012   Anticardiolipin antibody positive    Hyperlipidemia    Hypertension    History of trichomoniasis 01/18/2011    Class: History of   THYROID NODULE, RIGHT 02/05/2008   GERD 02/05/2008   NECK PAIN 02/05/2008    Past Medical History:  Diagnosis Date   Anal fistula    intersphincteric    Anemia    Anticardiolipin antibody positive    Cervicalgia    followed by dr Marikay Alar   Depression    History of herpes genitalis    many yrs ago   History of kidney stones    Hyperlipidemia    Hypertension    followed by pcp   (11-25-2019  per pt had stress test approx. 2018 w/ Dr Donnie Aho, told normal)   Migraines    neurologist--- dr Lucia Gaskins   OSA on CPAP    TMJ (dislocation of temporomandibular joint)    11-25-2019  jaw pops, no appliance   Wears glasses     Family History  Problem Relation Age of Onset   Heart disease Mother 63       died suddenly of MI   Heart attack Mother    Diabetes Father    Hypertension Father    Cancer Father        colon cancer   Kidney Stones Father    Diabetes Sister    Stroke Sister    Heart disease Maternal Uncle 55       died of MI   Heart disease Maternal Uncle    Breast cancer Maternal Grandmother    Mental illness Son    Healthy Daughter    Healthy Daughter    Sleep apnea Neg Hx    Past Surgical History:  Procedure Laterality Date   ANTERIOR CERVICAL DECOMP/DISCECTOMY FUSION  12/ 2020   @[facility]   C4 - 6   CESAREAN SECTION     X3   LAST ONE 11-30-2004   CYSTOSCOPY W/ RETROGRADES Right 10/29/2012   Procedure: CYSTOSCOPY WITH RETROGRADE PYELOGRAM and stent placement;  Surgeon: Martina Sinner, MD;  Location: [facility];  Service: Urology;  Laterality: Right;  rt stent placement , rt retrograde and cysto    CYSTOSCOPY W/ URETERAL STENT PLACEMENT Right 11/23/2012   Procedure:  CYSTOSCOPY WITH STENT REPLACEMENT;  Surgeon: Sebastian Ache, MD;  Location[facility];  Service: Urology;  Laterality: Right;   CYSTOSCOPY/RETROGRADE/URETEROSCOPY/STONE EXTRACTION WITH BASKET Right 11/23/2012   Procedure: CYSTOSCOPY/RETROGRADE/URETEROSCOPY/STONE EXTRACTION WITH BASKET;  Surgeon: Sebastian Ache, MD;  Location: [facility];  Service: Urology;  Laterality: Right;   EVALUATION UNDER ANESTHESIA WITH ANAL FISSUROTOMY N/A 01/23/2020   Procedure: ANAL EXAM UNDER ANESTHESIA, FISTULOTOMY, SKIN TAG EXCISION;  Surgeon: Romie Levee, MD;  Location: [facility];  Service: General;  Laterality: N/A;   INTRAUTERINE DEVICE (IUD) INSERTION     mirena inserted 07-11-18   SKIN CANCER DESTRUCTION     Social History   Social History Narrative   Lives at home  w/ her children   Right-handed      Fulltime: [employer]          Issac (30) M   Gabriel (24) F   Annalyse (18) F      Hobbies : outside activities and volleyball    Immunization History  Administered Date(s) Administered   DTaP 08/10/2007   Influenza Split 05/21/2014, 05/22/2016, 06/15/2021   Influenza Whole 05/29/2013   Influenza-Unspecified 05/26/2015, 06/22/2018, 06/18/2019, 06/15/2020   PFIZER(Purple Top)SARS-COV-2 Vaccination 01/13/2020, 02/03/2020   Tdap 01/22/2018   Zoster Recombinant(Shingrix) 06/14/2017, 11/06/2020     Objective: Vital Signs: BP 123/85 (BP Location: Left Arm, Patient Position: Sitting, Cuff Size: Normal)   Pulse 94   Resp 15   Ht 5\' 4"  (1.626 m)   Wt 165 lb 6.4 oz (75 kg)   BMI 28.39 kg/m    Physical Exam Vitals and nursing note reviewed.  Constitutional:      Appearance: She is well-developed.  HENT:     Head: Normocephalic and atraumatic.  Eyes:     Conjunctiva/sclera: Conjunctivae normal.  Cardiovascular:     Rate and Rhythm: Normal rate and regular rhythm.     Heart sounds: Normal heart sounds.  Pulmonary:     Effort: Pulmonary effort  is normal.     Breath sounds: Normal breath sounds.  Abdominal:     General: Bowel sounds are normal.     Palpations: Abdomen is soft.  Musculoskeletal:     Cervical back: Normal range of motion.  Lymphadenopathy:     Cervical: No cervical adenopathy.  Skin:    General: Skin is warm and dry.     Capillary Refill: Capillary refill takes less than 2 seconds.  Neurological:     Mental Status: She is alert and oriented to person, place, and time.  Psychiatric:        Behavior: Behavior normal.      Musculoskeletal Exam: Cervical, thoracic and lumbar spine were in good range of motion.  Shoulder joints, elbow joints, wrist joints, MCPs PIPs and DIPs been good range of motion with no synovitis.  Mild PIP and DIP prominence was noted.  Hip joints, knee joints, ankles, MTPs and PIPs with good range of motion with no synovitis.  CDAI Exam: CDAI Score: -- Patient Global: --; Provider Global: -- Swollen: --; Tender: -- Joint Exam 04/05/2023   No joint exam has been documented for this visit   There is currently no information documented on the homunculus. Go to the Rheumatology activity and complete the homunculus joint exam.  Investigation: No additional findings.  Imaging: No results found.  Recent Labs: Lab Results  Component Value Date   WBC 8.4 11/15/2022   HGB 12.5 11/15/2022   PLT 294 11/15/2022   NA 143 11/15/2022   K 3.9 11/15/2022   CL 106 11/15/2022   CO2 20 11/15/2022   GLUCOSE 87 11/15/2022   BUN 13 11/15/2022   CREATININE 0.87 11/15/2022   BILITOT 0.3 11/15/2022   ALKPHOS 108 11/15/2022   AST 16 11/15/2022   ALT 15 11/15/2022   PROT 6.6 11/15/2022   ALBUMIN 4.2 11/15/2022   CALCIUM 9.1 11/15/2022   GFRAA 105 06/26/2020   QFTBGOLDPLUS Negative 11/02/2018    Speciality Comments: No specialty comments available.  Procedures:  No procedures performed Allergies: Imitrex [sumatriptan base], Zomig, Avelox [moxifloxacin hcl in nacl], Morphine, Septra  [bactrim], Sulfamethoxazole, Sumatriptan, and Trimethoprim   Assessment / Plan:     Visit Diagnoses: Raynaud's phenomenon without  gangrene -patient continues to have mild Raynaud's symptoms.  Patient states her symptoms are worse during the winter months.  There is no history of digital ulcers.  No sclerodactyly or nailbed capillary changes were noted.  03/24/22 UA neg, ANA 1:40NS, ENA neg, C3-C4 normal, beta-2 neg, anticardiolipin neg,MPO neg, serine protease 3 neg, ESR 11, RF neg, anti-CCP neg, cryoglobulin neg.  Labs obtained at the previous visit were reviewed.  Positive ANA (antinuclear antibody) -patient had low titer ANA in the past.  Patient states that her symptoms are getting worse with increased fatigue, hair loss, photosensitivity, nasal ulcers, sicca symptoms.  She also complains of increased arthralgias.  No synovitis was noted.  We will repeat labs.  Patient will obtain labs at her office.  Plan: Protein / creatinine ratio, urine, CBC with Differential/Platelet, COMPLETE METABOLIC PANEL WITH GFR, ANA, Anti-DNA antibody, double-stranded, C3 and C4, Sedimentation rate, Sjogrens syndrome-A extractable nuclear antibody, Sjogrens syndrome-B extractable nuclear antibody, Anti-Smith antibody, RNP Antibody.  I will contact her once the lab results are available.  Patient plans to return for a follow-up visit in 15 months if the labs are normal.  Hair loss-she continues to have hair loss.  No increased hair thinning was noted.  Dry mouth-she gives history of dry mouth and dry eyes.  No parotid swelling was noted.  Photosensitization due to sun-she notices increased photosensitivity.  Other fatigue -she continues to have fatigue.  I will obtain following labs today.  Plan: CK, TSH  Primary osteoarthritis of both hands - Clinical and radiographic findings were consistent with osteoarthritis.  Trochanteric bursitis of both hips-she gives history of intermittent trochanteric  bursitis.  Patellofemoral pain syndrome of both knees  DDD (degenerative disc disease), cervical-she had limited range of motion of her cervical spine.  She has chronic discomfort in her cervical spine.  Spinal stenosis of cervical region  Plantar fasciitis-she gives a tree of infrequent episodes.  Myalgia -she gives history of muscle cramps.  CK was normal.  Vitamin D deficiency -she had vitamin D deficiency in the past.  We will check vitamin D level again.  Plan: VITAMIN D 25 Hydroxy (Vit-D Deficiency, Fractures)  Primary hypertension-blood pressure was 123/85.  Other medical problems are listed as follows:  History of hyperlipidemia  Chronic migraine without aura without status migrainosus, not intractable  Gastroesophageal reflux disease without esophagitis  Overactive bladder  History of renal stone  IUD (intrauterine device) in place  Family history of MS (multiple sclerosis)-Sister  Former smoker - 1 pack/day for 15 years.  She is vaping now.    Orders: Orders Placed This Encounter  Procedures   Protein / creatinine ratio, urine   CBC with Differential/Platelet   COMPLETE METABOLIC PANEL WITH GFR   ANA   Anti-DNA antibody, double-stranded   C3 and C4   Sedimentation rate   CK   TSH   VITAMIN D 25 Hydroxy (Vit-D Deficiency, Fractures)   Sjogrens syndrome-A extractable nuclear antibody   Sjogrens syndrome-B extractable nuclear antibody   Anti-Smith antibody   RNP Antibody   No orders of the defined types were placed in this encounter.    Follow-Up Instructions: Return in about 4 weeks (around 05/03/2023) for Raynauds .   Pollyann Savoy, MD  Note - This record has been created using Animal nutritionist.  Chart creation errors have been sought, but may not always  have been located. Such creation errors do not reflect on  the standard of medical care.

## 2023-03-31 ENCOUNTER — Other Ambulatory Visit (HOSPITAL_COMMUNITY): Payer: Self-pay

## 2023-04-03 ENCOUNTER — Other Ambulatory Visit (HOSPITAL_COMMUNITY): Payer: Self-pay

## 2023-04-03 NOTE — Telephone Encounter (Signed)
Submitted new auth via cmm, status is pending. Key: [id number]

## 2023-04-04 ENCOUNTER — Other Ambulatory Visit: Payer: Self-pay

## 2023-04-04 ENCOUNTER — Other Ambulatory Visit: Payer: Self-pay | Admitting: Cardiovascular Disease

## 2023-04-05 ENCOUNTER — Encounter: Payer: Self-pay | Admitting: Rheumatology

## 2023-04-05 ENCOUNTER — Ambulatory Visit: Payer: Commercial Managed Care - PPO | Attending: Rheumatology | Admitting: Rheumatology

## 2023-04-05 ENCOUNTER — Other Ambulatory Visit (HOSPITAL_COMMUNITY): Payer: Self-pay

## 2023-04-05 VITALS — BP 123/85 | HR 94 | Resp 15 | Ht 64.0 in | Wt 165.4 lb

## 2023-04-05 DIAGNOSIS — R768 Other specified abnormal immunological findings in serum: Secondary | ICD-10-CM

## 2023-04-05 DIAGNOSIS — Z975 Presence of (intrauterine) contraceptive device: Secondary | ICD-10-CM

## 2023-04-05 DIAGNOSIS — I7300 Raynaud's syndrome without gangrene: Secondary | ICD-10-CM | POA: Diagnosis not present

## 2023-04-05 DIAGNOSIS — M791 Myalgia, unspecified site: Secondary | ICD-10-CM

## 2023-04-05 DIAGNOSIS — L659 Nonscarring hair loss, unspecified: Secondary | ICD-10-CM | POA: Diagnosis not present

## 2023-04-05 DIAGNOSIS — I10 Essential (primary) hypertension: Secondary | ICD-10-CM

## 2023-04-05 DIAGNOSIS — L568 Other specified acute skin changes due to ultraviolet radiation: Secondary | ICD-10-CM

## 2023-04-05 DIAGNOSIS — Z87891 Personal history of nicotine dependence: Secondary | ICD-10-CM

## 2023-04-05 DIAGNOSIS — M222X1 Patellofemoral disorders, right knee: Secondary | ICD-10-CM

## 2023-04-05 DIAGNOSIS — Z87442 Personal history of urinary calculi: Secondary | ICD-10-CM

## 2023-04-05 DIAGNOSIS — M7061 Trochanteric bursitis, right hip: Secondary | ICD-10-CM

## 2023-04-05 DIAGNOSIS — M19041 Primary osteoarthritis, right hand: Secondary | ICD-10-CM

## 2023-04-05 DIAGNOSIS — R682 Dry mouth, unspecified: Secondary | ICD-10-CM | POA: Diagnosis not present

## 2023-04-05 DIAGNOSIS — N3281 Overactive bladder: Secondary | ICD-10-CM

## 2023-04-05 DIAGNOSIS — Z820 Family history of epilepsy and other diseases of the nervous system: Secondary | ICD-10-CM

## 2023-04-05 DIAGNOSIS — G43709 Chronic migraine without aura, not intractable, without status migrainosus: Secondary | ICD-10-CM

## 2023-04-05 DIAGNOSIS — M722 Plantar fascial fibromatosis: Secondary | ICD-10-CM | POA: Diagnosis not present

## 2023-04-05 DIAGNOSIS — M7062 Trochanteric bursitis, left hip: Secondary | ICD-10-CM

## 2023-04-05 DIAGNOSIS — R5383 Other fatigue: Secondary | ICD-10-CM | POA: Diagnosis not present

## 2023-04-05 DIAGNOSIS — M19042 Primary osteoarthritis, left hand: Secondary | ICD-10-CM

## 2023-04-05 DIAGNOSIS — M4802 Spinal stenosis, cervical region: Secondary | ICD-10-CM

## 2023-04-05 DIAGNOSIS — M222X2 Patellofemoral disorders, left knee: Secondary | ICD-10-CM

## 2023-04-05 DIAGNOSIS — E559 Vitamin D deficiency, unspecified: Secondary | ICD-10-CM

## 2023-04-05 DIAGNOSIS — K219 Gastro-esophageal reflux disease without esophagitis: Secondary | ICD-10-CM

## 2023-04-05 DIAGNOSIS — M5030 Other cervical disc degeneration, unspecified cervical region: Secondary | ICD-10-CM

## 2023-04-05 DIAGNOSIS — Z8639 Personal history of other endocrine, nutritional and metabolic disease: Secondary | ICD-10-CM

## 2023-04-05 MED ORDER — AMLODIPINE BESYLATE 5 MG PO TABS
5.0000 mg | ORAL_TABLET | Freq: Every day | ORAL | 0 refills | Status: DC
  Filled 2023-04-05: qty 30, 30d supply, fill #0

## 2023-04-06 ENCOUNTER — Other Ambulatory Visit (HOSPITAL_BASED_OUTPATIENT_CLINIC_OR_DEPARTMENT_OTHER): Payer: Self-pay

## 2023-04-06 ENCOUNTER — Other Ambulatory Visit (HOSPITAL_COMMUNITY): Payer: Self-pay

## 2023-04-07 ENCOUNTER — Other Ambulatory Visit (HOSPITAL_COMMUNITY): Payer: Self-pay

## 2023-04-07 ENCOUNTER — Other Ambulatory Visit: Payer: Self-pay

## 2023-04-10 ENCOUNTER — Other Ambulatory Visit (HOSPITAL_COMMUNITY): Payer: Self-pay

## 2023-04-11 ENCOUNTER — Other Ambulatory Visit: Payer: Commercial Managed Care - PPO

## 2023-04-11 DIAGNOSIS — E559 Vitamin D deficiency, unspecified: Secondary | ICD-10-CM | POA: Diagnosis not present

## 2023-04-11 DIAGNOSIS — R5383 Other fatigue: Secondary | ICD-10-CM | POA: Diagnosis not present

## 2023-04-11 DIAGNOSIS — R768 Other specified abnormal immunological findings in serum: Secondary | ICD-10-CM

## 2023-04-11 NOTE — Addendum Note (Signed)
Addended by: Herminio Commons A on: 04/11/2023 08:42 AM   Modules accepted: Orders

## 2023-04-11 NOTE — Addendum Note (Signed)
Addended by: Herminio Commons A on: 04/11/2023 08:41 AM   Modules accepted: Orders

## 2023-04-12 LAB — COMPREHENSIVE METABOLIC PANEL
ALT: 14 IU/L (ref 0–32)
AST: 15 IU/L (ref 0–40)
Albumin: 4.6 g/dL (ref 3.8–4.9)
Alkaline Phosphatase: 113 IU/L (ref 44–121)
BUN/Creatinine Ratio: 15 (ref 9–23)
BUN: 13 mg/dL (ref 6–24)
Bilirubin Total: 0.6 mg/dL (ref 0.0–1.2)
CO2: 22 mmol/L (ref 20–29)
Calcium: 9.6 mg/dL (ref 8.7–10.2)
Chloride: 107 mmol/L — ABNORMAL HIGH (ref 96–106)
Creatinine, Ser: 0.85 mg/dL (ref 0.57–1.00)
Globulin, Total: 2.4 g/dL (ref 1.5–4.5)
Glucose: 84 mg/dL (ref 70–99)
Potassium: 4 mmol/L (ref 3.5–5.2)
Sodium: 144 mmol/L (ref 134–144)
Total Protein: 7 g/dL (ref 6.0–8.5)
eGFR: 80 mL/min/{1.73_m2} (ref >59)

## 2023-04-12 LAB — ANA: Anti Nuclear Antibody (ANA): POSITIVE — AB

## 2023-04-12 LAB — C3 AND C4
Complement C3, Serum: 145 mg/dL (ref 82–167)
Complement C4, Serum: 27 mg/dL (ref 12–38)

## 2023-04-12 LAB — SEDIMENTATION RATE: Sed Rate: 6 mm/h (ref 0–40)

## 2023-04-12 LAB — CBC WITH DIFFERENTIAL/PLATELET
Basophils Absolute: 0.1 10*3/uL (ref 0.0–0.2)
Basos: 0 %
EOS (ABSOLUTE): 0.1 10*3/uL (ref 0.0–0.4)
Eos: 1 %
Hematocrit: 40.3 % (ref 34.0–46.6)
Hemoglobin: 13.5 g/dL (ref 11.1–15.9)
Immature Grans (Abs): 0 10*3/uL (ref 0.0–0.1)
Immature Granulocytes: 0 %
Lymphocytes Absolute: 2.3 10*3/uL (ref 0.7–3.1)
Lymphs: 16 %
MCH: 31.8 pg (ref 26.6–33.0)
MCHC: 33.5 g/dL (ref 31.5–35.7)
MCV: 95 fL (ref 79–97)
Monocytes Absolute: 0.7 10*3/uL (ref 0.1–0.9)
Monocytes: 5 %
Neutrophils Absolute: 10.6 10*3/uL — ABNORMAL HIGH (ref 1.4–7.0)
Neutrophils: 78 %
Platelets: 348 10*3/uL (ref 150–450)
RBC: 4.25 x10E6/uL (ref 3.77–5.28)
RDW: 13 % (ref 11.7–15.4)
WBC: 13.8 10*3/uL — ABNORMAL HIGH (ref 3.4–10.8)

## 2023-04-12 LAB — PROTEIN / CREATININE RATIO, URINE
Creatinine, Urine: 111.4 mg/dL
Protein, Ur: 20.3 mg/dL
Protein/Creat Ratio: 182 mg/g creat (ref 0–200)

## 2023-04-12 LAB — ANTI-DNA ANTIBODY, DOUBLE-STRANDED: dsDNA Ab: 1 IU/mL (ref 0–9)

## 2023-04-12 LAB — RNP ANTIBODIES: ENA RNP Ab: 0.9 AI (ref 0.0–0.9)

## 2023-04-12 LAB — CK: Total CK: 120 U/L (ref 32–182)

## 2023-04-12 LAB — SJOGRENS SYNDROME-A EXTRACTABLE NUCLEAR ANTIBODY: ENA SSA (RO) Ab: <0.2 AI (ref 0.0–0.9)

## 2023-04-12 LAB — TSH: TSH: 2.87 u[IU]/mL (ref 0.450–4.500)

## 2023-04-12 LAB — VITAMIN D 25 HYDROXY (VIT D DEFICIENCY, FRACTURES): Vit D, 25-Hydroxy: 32.2 ng/mL (ref 30.0–100.0)

## 2023-04-12 LAB — SJOGRENS SYNDROME-B EXTRACTABLE NUCLEAR ANTIBODY: ENA SSB (LA) Ab: <0.2 AI (ref 0.0–0.9)

## 2023-04-12 LAB — ANTI-SMITH ANTIBODY: ENA SM Ab Ser-aCnc: <0.2 AI (ref 0.0–0.9)

## 2023-04-19 ENCOUNTER — Ambulatory Visit: Payer: Commercial Managed Care - PPO | Admitting: Neurology

## 2023-04-19 DIAGNOSIS — G43709 Chronic migraine without aura, not intractable, without status migrainosus: Secondary | ICD-10-CM

## 2023-04-19 MED ORDER — ONABOTULINUMTOXINA 200 UNITS IJ SOLR
155.0000 [IU] | Freq: Once | INTRAMUSCULAR | Status: AC
Start: 2023-04-19 — End: 2023-04-19
  Administered 2023-04-19: 155 [IU] via INTRAMUSCULAR

## 2023-04-19 NOTE — Progress Notes (Signed)
Botox consent signed  Botox- 200 units x 1 vial Lot: [id number] Expiration: 06/2025 NDC: 1610-9604-54  Bacteriostatic 0.9% Sodium Chloride- 4 mL  Lot: [id number] Expiration: 11/21/2023 NDC: 1478-2956-21  Dx: H08.657 S/P Witnessed by Clemencia Course RN

## 2023-04-19 NOTE — Progress Notes (Signed)
Consent Form Botulism Toxin Injection For Chronic Migraine  04/19/2023: doing excellent, stable or better  01/24/2023: doong great, daughter graduated HS and will see me for migraines. minimum > 60% - 70 improvement in migraine and headache frequency  08/08/2022: stable doing well, minimum > 60% - 70 improvement in migraine and headache frequency  05/12/2022: She will get migrianes 2 weeks after botox when the botox has worn off. This has happened a few times, takes nurtec and then kicks in. after that she does great no more than 4 migraine days a month and no headache days. At baseline she had daily headaches prior to botox.   02/17/2022: >>90% improvement in migraine frequency.  Reviewed orally with patient, additionally signature is on file:  Botulism toxin has been approved by the Federal drug administration for treatment of chronic migraine. Botulism toxin does not cure chronic migraine and it may not be effective in some patients.  The administration of botulism toxin is accomplished by injecting a small amount of toxin into the muscles of the neck and head. Dosage must be titrated for each individual. Any benefits resulting from botulism toxin tend to wear off after 3 months with a repeat injection required if benefit is to be maintained. Injections are usually done every 3-4 months with maximum effect peak achieved by about 2 or 3 weeks. Botulism toxin is expensive and you should be sure of what costs you will incur resulting from the injection.  The side effects of botulism toxin use for chronic migraine may include:   -Transient, and usually mild, facial weakness with facial injections  -Transient, and usually mild, head or neck weakness with head/neck injections  -Reduction or loss of forehead facial animation due to forehead muscle weakness  -Eyelid drooping  -Dry eye  -Pain at the site of injection or bruising at the site of injection  -Double vision  -Potential unknown long term  risks  Contraindications: You should not have Botox if you are pregnant, nursing, allergic to albumin, have an infection, skin condition, or muscle weakness at the site of the injection, or have myasthenia gravis, Lambert-Eaton syndrome, or ALS.  It is also possible that as with any injection, there may be an allergic reaction or no effect from the medication. Reduced effectiveness after repeated injections is sometimes seen and rarely infection at the injection site may occur. All care will be taken to prevent these side effects. If therapy is given over a long time, atrophy and wasting in the muscle injected may occur. Occasionally the patient's become refractory to treatment because they develop antibodies to the toxin. In this event, therapy needs to be modified.  I have read the above information and consent to the administration of botulism toxin.    BOTOX PROCEDURE NOTE FOR MIGRAINE HEADACHE    Contraindications and precautions discussed with patient(above). Aseptic procedure was observed and patient tolerated procedure. Procedure performed by Dr. Artemio Aly  The condition has existed for more than 6 months, and pt does not have a diagnosis of ALS, Myasthenia Gravis or Lambert-Eaton Syndrome.  Risks and benefits of injections discussed and pt agrees to proceed with the procedure.  Written consent obtained  These injections are medically necessary. Pt  receives good benefits from these injections. These injections do not cause sedations or hallucinations which the oral therapies may cause.  Description of procedure:  The patient was placed in a sitting position. The standard protocol was used for Botox as follows, with 5 units of Botox injected  at each site:   -Procerus muscle, midline injection  -Corrugator muscle, bilateral injection  -Frontalis muscle, bilateral injection, with 2 sites each side, medial injection was performed in the upper one third of the frontalis muscle, in  the region vertical from the medial inferior edge of the superior orbital rim. The lateral injection was again in the upper one third of the forehead vertically above the lateral limbus of the cornea, 1.5 cm lateral to the medial injection site.  -Temporalis muscle injection, 4 sites, bilaterally. The first injection was 3 cm above the tragus of the ear, second injection site was 1.5 cm to 3 cm up from the first injection site in line with the tragus of the ear. The third injection site was 1.5-3 cm forward between the first 2 injection sites. The fourth injection site was 1.5 cm posterior to the second injection site.   -Occipitalis muscle injection, 3 sites, bilaterally. The first injection was done one half way between the occipital protuberance and the tip of the mastoid process behind the ear. The second injection site was done lateral and superior to the first, 1 fingerbreadth from the first injection. The third injection site was 1 fingerbreadth superiorly and medially from the first injection site.  -Cervical paraspinal muscle injection, 2 sites, bilateral knee first injection site was 1 cm from the midline of the cervical spine, 3 cm inferior to the lower border of the occipital protuberance. The second injection site was 1.5 cm superiorly and laterally to the first injection site.  -Trapezius muscle injection was performed at 3 sites, bilaterally. The first injection site was in the upper trapezius muscle halfway between the inflection point of the neck, and the acromion. The second injection site was one half way between the acromion and the first injection site. The third injection was done between the first injection site and the inflection point of the neck.   Will return for repeat injection in 3 months.   155 units of Botox was used, 45u Botox not injected was wasted. The patient tolerated the procedure well, there were no complications of the above procedure.

## 2023-05-04 ENCOUNTER — Telehealth: Payer: Self-pay | Admitting: Cardiovascular Disease

## 2023-05-04 ENCOUNTER — Other Ambulatory Visit (HOSPITAL_COMMUNITY): Payer: Self-pay

## 2023-05-04 NOTE — Telephone Encounter (Signed)
Tried to return call to patient. Phone just rings. Unable to reach patient

## 2023-05-04 NOTE — Telephone Encounter (Signed)
Tried to return call to patient. Phone just rings. Someone pick up and hung up. x2

## 2023-05-04 NOTE — Telephone Encounter (Signed)
Pt c/o of Chest Pain: STAT if active CP, including tightness, pressure, jaw pain, radiating pain to shoulder/upper arm/back, CP unrelieved by Nitro. Symptoms reported of SOB, nausea, vomiting, sweating.  1. Are you having CP right now? No     2. Are you experiencing any other symptoms (ex. SOB, nausea, vomiting, sweating)? Has neck pain, but this is a normal constant pain for her.   3. Is your CP continuous or coming and going? Coming and going    4. Have you taken Nitroglycerin? No, does not have any.    5. How long have you been experiencing CP? Past month     6. If NO CP at time of call then end call with telling Pt to call back or call 911 if Chest pain returns prior to return call from triage team.

## 2023-05-04 NOTE — Telephone Encounter (Signed)
Pt returning nurses phone call. Please advise ?

## 2023-05-05 ENCOUNTER — Other Ambulatory Visit: Payer: Self-pay

## 2023-05-05 ENCOUNTER — Other Ambulatory Visit (HOSPITAL_COMMUNITY): Payer: Self-pay

## 2023-05-05 ENCOUNTER — Other Ambulatory Visit: Payer: Self-pay | Admitting: *Deleted

## 2023-05-05 NOTE — Telephone Encounter (Signed)
Spoke with pt and has noted for over the last month an increase in chest pain that last for a few minutes can occur at rest no other symptoms does have neck pain but suffers from disc issues Pt does walk 1-2 times per week approx mile and does not have chest pain Pt is under stress but not more than usual Appt made with Azalee Course PA for 05/10/23 at 2:45 pm Instructed if S/S worsen to go to nearest ED for eval and tx Pt verbalized understanding ./cy

## 2023-05-08 ENCOUNTER — Ambulatory Visit (INDEPENDENT_AMBULATORY_CARE_PROVIDER_SITE_OTHER): Payer: Commercial Managed Care - PPO | Admitting: Obstetrics and Gynecology

## 2023-05-08 ENCOUNTER — Encounter: Payer: Self-pay | Admitting: Obstetrics and Gynecology

## 2023-05-08 ENCOUNTER — Other Ambulatory Visit (HOSPITAL_COMMUNITY): Payer: Self-pay

## 2023-05-08 VITALS — HR 98

## 2023-05-08 DIAGNOSIS — R350 Frequency of micturition: Secondary | ICD-10-CM | POA: Diagnosis not present

## 2023-05-08 DIAGNOSIS — R232 Flushing: Secondary | ICD-10-CM

## 2023-05-08 DIAGNOSIS — N898 Other specified noninflammatory disorders of vagina: Secondary | ICD-10-CM | POA: Diagnosis not present

## 2023-05-08 LAB — URINALYSIS, COMPLETE W/RFL CULTURE
Bacteria, UA: NONE SEEN /HPF
Bilirubin Urine: NEGATIVE
Glucose, UA: NEGATIVE
Hyaline Cast: NONE SEEN /LPF
Ketones, ur: NEGATIVE
Leukocyte Esterase: NEGATIVE
Nitrites, Initial: NEGATIVE
Protein, ur: NEGATIVE
RBC / HPF: NONE SEEN /HPF (ref 0–2)
Specific Gravity, Urine: 1.02 (ref 1.001–1.035)
WBC, UA: NONE SEEN /HPF (ref 0–5)
pH: 7 (ref 5.0–8.0)

## 2023-05-08 LAB — WET PREP FOR TRICH, YEAST, CLUE

## 2023-05-08 LAB — NO CULTURE INDICATED

## 2023-05-08 MED ORDER — ESTRADIOL 0.1 MG/GM VA CREA
1.0000 | TOPICAL_CREAM | Freq: Every day | VAGINAL | 12 refills | Status: DC
  Filled 2023-05-08: qty 42.5, 30d supply, fill #0
  Filled 2023-06-02: qty 42.5, 30d supply, fill #1
  Filled 2023-07-28: qty 42.5, 30d supply, fill #2
  Filled 2024-02-21: qty 42.5, 30d supply, fill #3
  Filled 2024-03-22: qty 42.5, 30d supply, fill #4
  Filled 2024-04-19: qty 42.5, 30d supply, fill #5

## 2023-05-08 MED ORDER — FLUCONAZOLE 150 MG PO TABS
150.0000 mg | ORAL_TABLET | Freq: Once | ORAL | 0 refills | Status: AC
  Filled 2023-05-08: qty 1, 1d supply, fill #0

## 2023-05-08 NOTE — Addendum Note (Signed)
Addended by: Rushie Goltz on: 05/08/2023 04:38 PM   Modules accepted: Orders

## 2023-05-08 NOTE — Progress Notes (Signed)
Patient presents with vaginal odor. No pruritus No new soaps, talc or douching Does report increased UTI's as well She is sexually active She has the mirena IUD in place  Pulse 98, SpO2 98%.  SVE: strings seen. Possible yeast seen Ua: trace blood Wet mount: negative  A/p vaginal odor, frequent uti To run hormone panel.  Discussed using the IUD with an estrogen patch for the 8 years allowed time to prevent unopposed estrogen on the uterus and prevent endometrial cancer.  May also just pull it and use a combo patch. For now, please use estrogen PV to support the vaginal PH and bladder. 3. Diflucan sent: wet mount negative, but to treat based on exam today.  She was fine with that. 4. RTC for annual exam  Dr. Karma Greaser

## 2023-05-09 LAB — ESTRADIOL: Estradiol: 22 pg/mL

## 2023-05-09 LAB — FOLLICLE STIMULATING HORMONE: FSH: 43.4 m[IU]/mL

## 2023-05-10 ENCOUNTER — Ambulatory Visit: Payer: Commercial Managed Care - PPO | Attending: Physician Assistant | Admitting: Physician Assistant

## 2023-05-10 ENCOUNTER — Other Ambulatory Visit (HOSPITAL_COMMUNITY): Payer: Self-pay

## 2023-05-10 VITALS — BP 122/78 | HR 91 | Ht 64.0 in | Wt 161.0 lb

## 2023-05-10 DIAGNOSIS — R072 Precordial pain: Secondary | ICD-10-CM

## 2023-05-10 DIAGNOSIS — I10 Essential (primary) hypertension: Secondary | ICD-10-CM | POA: Diagnosis not present

## 2023-05-10 DIAGNOSIS — I2510 Atherosclerotic heart disease of native coronary artery without angina pectoris: Secondary | ICD-10-CM | POA: Diagnosis not present

## 2023-05-10 MED ORDER — METOPROLOL TARTRATE 100 MG PO TABS
100.0000 mg | ORAL_TABLET | Freq: Two times a day (BID) | ORAL | 0 refills | Status: DC
  Filled 2023-05-10: qty 1, 1d supply, fill #0

## 2023-05-10 NOTE — Progress Notes (Unsigned)
Cardiology Office Note:  .   Date:  05/11/2023  ID:  Kelly [name], DOB [birth year], MRN [id number] PCP: Eden Emms, NP  [facility] Providers Cardiologist:  Reatha Harps, MD     History of Present Illness: .   Kelly [name] is a 57 y.o. female with PMH of obesity, nonobstructive CAD and hyperlipidemia.  Patient was previously evaluated for atypical chest pain.  Coronary CTA in November 2021 showed a 24 to 49% LAD lesion, less than 25% left circumflex lesion, coronary calcium score of 41 which placed the patient in 93rd percentile for age and sex matched control.  She was started on aspirin and Crestor.  She was last seen by Dr. Bufford Buttner in February 2022 at which time she continued to have intermittent atypical chest pain.  Echocardiogram obtained on 11/10/2020 showed EF 55 to 60%, normal RV.  She has a history of positive ANA and osteoarthritis and has been followed by rheumatology service for Raynaud's phenomenon.  She also have generalized pain and hair loss.  Patient presents today for evaluation of intermittent sharp substernal chest pain that is been going on for the past month.  She says she always have some degree of chest discomfort intermittently, however she has been noticing more symptom in the past month.  This symptom has no correlation with the degree of exertion.  She can still walk her dog on the weekend without any symptoms.  I recommended repeat a coronary CT.  She will receive a single dose of metoprolol tartrate to 100 mg tablet prior to the coronary CT.  ROS:   Patient complains of intermittent sharp substernal chest discomfort.  She denies any shortness of breath, lower extremity edema, orthopnea or PND.  Studies Reviewed: Marland Kitchen   EKG Interpretation Date/Time:  Wednesday May 10 2023 14:54:56 EDT Ventricular Rate:  91 PR Interval:  136 QRS Duration:  84 QT Interval:  362 QTC Calculation: 445 R Axis:   61  Text Interpretation: Normal sinus rhythm  Nonspecific T wave abnormality Confirmed by Azalee Course [phone]) on 05/11/2023 8:54:34 PM     Risk Assessment/Calculations:             Physical Exam:   VS:  BP 122/78 (Patient Position: Sitting, Cuff Size: Normal)   Pulse 91   Ht 5\' 4"  (1.626 m)   Wt 161 lb (73 kg)   BMI 27.64 kg/m    Wt Readings from Last 3 Encounters:  05/10/23 161 lb (73 kg)  04/05/23 165 lb 6.4 oz (75 kg)  03/21/23 165 lb 3.2 oz (74.9 kg)    GEN: Well nourished, well developed in no acute distress NECK: No JVD; No carotid bruits CARDIAC: RRR, no murmurs, rubs, gallops RESPIRATORY:  Clear to auscultation without rales, wheezing or rhonchi  ABDOMEN: Soft, non-tender, non-distended EXTREMITIES:  No edema; No deformity   ASSESSMENT AND PLAN: .    Precordial chest pain: Will proceed with coronary CT.  Patient will need to take a single dose of 100 mg metoprolol prior to the coronary CT  Nonobstructive CAD: Previous coronary CT obtained in November 2021 showed nonobstructive disease  Hyperlipidemia: On rosuvastatin.       Dispo: Follow-up in 6 weeks  Signed, Azalee Course, PA

## 2023-05-10 NOTE — Patient Instructions (Addendum)
Medication Instructions:  NO CHANGES *If you need a refill on your cardiac medications before your next appointment, please call your pharmacy*   Lab Work: BMP TODAY If you have labs (blood work) drawn today and your tests are completely normal, you will receive your results only by: MyChart Message (if you have MyChart) OR A paper copy in the mail If you have any lab test that is abnormal or we need to change your treatment, we will call you to review the results.   Testing/Procedures:[address] .Your physician has requested that you have cardiac CT. Cardiac computed tomography (CT) is a painless test that uses an x-ray machine to take clear, detailed pictures of your heart. For further information please visit [email or web address] Please follow instructions as given below.     Follow-Up: At [facility], you and your health needs are our priority.  As part of our continuing mission to provide you with exceptional heart care, we have created designated Provider Care Teams.  These Care Teams include your primary Cardiologist (physician) and Advanced Practice Providers (APPs -  Physician Assistants and Nurse Practitioners) who all work together to provide you with the care you need, when you need it.    Your next appointment:   6 week(s)  Provider:   APP  Other Instructions TAKE METOPROLOL TARTRATE 100 MG TABLET 2 HOURS PRIOR TO CORONARY CT.  HOLD AMLODIPINE THE NIGHT BEFORE PROCEDURE.     Your cardiac CT will be scheduled at one of the below locations:   [facility] [address] [place], [place] [address] [phone]   If scheduled at [facility], please arrive at the [facility] Entrance (Entrance C2) of [facility] 30 minutes prior to test start time. You can use the FREE valet parking offered at entrance C (encouraged to control the heart rate for the test)  Proceed to the [facility] Radiology Department (first  floor) to check-in and test prep.  All radiology patients and guests should use entrance C2 at [facility], accessed from [facility], even though the hospital's physical address listed is [address]    If scheduled at [facility], please arrive 15 mins early for check-in and test prep.  There is spacious parking and easy access to the radiology department from the [facility] Heart and Vascular entrance. Please enter here and check-in with the desk attendant.   Please follow these instructions carefully (unless otherwise directed):  An IV will be required for this test and Nitroglycerin will be given.  Hold all erectile dysfunction medications at least 3 days (72 hrs) prior to test. (Ie viagra, cialis, sildenafil, tadalafil, etc)   On the Night Before the Test: Be sure to Drink plenty of water. Do not consume any caffeinated/decaffeinated beverages or chocolate 12 hours prior to your test. Do not take any antihistamines 12 hours prior to your test. If the patient has contrast allergy: Patient will need a prescription for Prednisone and very clear instructions (as follows): Prednisone 50 mg - take 13 hours prior to test Take another Prednisone 50 mg 7 hours prior to test Take another Prednisone 50 mg 1 hour prior to test Take Benadryl 50 mg 1 hour prior to test Patient must complete all four doses of above prophylactic medications. Patient will need a ride after test due to Benadryl.  On the Day of the Test: Drink plenty of water until 1 hour prior to the  test. Do not eat any food 1 hour prior to test. You may take your regular medications prior to the test.  Take metoprolol (Lopressor) two hours prior to test. If you take Furosemide/Hydrochlorothiazide/Spironolactone, please HOLD on the morning of the test. FEMALES- please wear underwire-free bra if available, avoid dresses & tight clothing      After the Test: Drink plenty of  water. After receiving IV contrast, you may experience a mild flushed feeling. This is normal. On occasion, you may experience a mild rash up to 24 hours after the test. This is not dangerous. If this occurs, you can take Benadryl 25 mg and increase your fluid intake. If you experience trouble breathing, this can be serious. If it is severe call 911 IMMEDIATELY. If it is mild, please call our office. If you take any of these medications: Glipizide/Metformin, Avandament, Glucavance, please do not take 48 hours after completing test unless otherwise instructed.  We will call to schedule your test 2-4 weeks out understanding that some insurance companies will need an authorization prior to the service being performed.   For more information and frequently asked questions, please visit our website : [email or web address]  For non-scheduling related questions, please contact the cardiac imaging nurse navigator should you have any questions/concerns: Cardiac Imaging Nurse Navigators Direct Office Dial: [phone]   For scheduling needs, including cancellations and rescheduling, please call [place], [phone].

## 2023-05-11 ENCOUNTER — Other Ambulatory Visit (HOSPITAL_COMMUNITY): Payer: Self-pay

## 2023-05-11 LAB — BASIC METABOLIC PANEL
BUN/Creatinine Ratio: 19 (ref 9–23)
BUN: 15 mg/dL (ref 6–24)
CO2: 25 mmol/L (ref 20–29)
Calcium: 9.2 mg/dL (ref 8.7–10.2)
Chloride: 107 mmol/L — ABNORMAL HIGH (ref 96–106)
Creatinine, Ser: 0.78 mg/dL (ref 0.57–1.00)
Glucose: 125 mg/dL — ABNORMAL HIGH (ref 70–99)
Potassium: 3.9 mmol/L (ref 3.5–5.2)
Sodium: 142 mmol/L (ref 134–144)
eGFR: 89 mL/min/{1.73_m2} (ref >59)

## 2023-05-12 ENCOUNTER — Encounter (HOSPITAL_COMMUNITY): Payer: Self-pay

## 2023-05-15 ENCOUNTER — Telehealth: Payer: Self-pay

## 2023-05-15 NOTE — Telephone Encounter (Addendum)
Called patient regarding results, patient has understanding of results.  ----- Message from Azalee Course sent at 05/12/2023 12:58 PM EDT ----- Stable renal function and electrolyte.

## 2023-05-17 ENCOUNTER — Ambulatory Visit (HOSPITAL_COMMUNITY)
Admission: RE | Admit: 2023-05-17 | Discharge: 2023-05-17 | Disposition: A | Discharge status: Home / Self Care | Payer: Commercial Managed Care - PPO | Source: Ambulatory Visit | Attending: Physician Assistant | Admitting: Physician Assistant

## 2023-05-17 DIAGNOSIS — R072 Precordial pain: Secondary | ICD-10-CM | POA: Insufficient documentation

## 2023-05-17 MED ORDER — NITROGLYCERIN 0.4 MG SL SUBL
SUBLINGUAL_TABLET | SUBLINGUAL | Status: AC
  Filled 2023-05-17: qty 2

## 2023-05-17 MED ORDER — NITROGLYCERIN 0.4 MG SL SUBL
0.8000 mg | SUBLINGUAL_TABLET | Freq: Once | SUBLINGUAL | Status: AC
  Administered 2023-05-17: 0.8 mg via SUBLINGUAL

## 2023-05-17 MED ORDER — IOHEXOL 350 MG/ML SOLN
95.0000 mL | Freq: Once | INTRAVENOUS | Status: AC | PRN
  Administered 2023-05-17: 95 mL via INTRAVENOUS

## 2023-05-18 ENCOUNTER — Telehealth: Payer: Self-pay

## 2023-05-18 NOTE — Telephone Encounter (Addendum)
Called patient regarding results, patient had understanding of results.  ----- Message from Azalee Course sent at 05/17/2023  4:57 PM EDT ----- Mild coronary artery disease noted, elevated coronary calcium score. When compare to 2021 coronary CT, there was no significant change

## 2023-05-24 ENCOUNTER — Other Ambulatory Visit (HOSPITAL_COMMUNITY): Payer: Self-pay

## 2023-05-24 DIAGNOSIS — Z6827 Body mass index (BMI) 27.0-27.9, adult: Secondary | ICD-10-CM | POA: Diagnosis not present

## 2023-05-24 DIAGNOSIS — Q761 Klippel-Feil syndrome: Secondary | ICD-10-CM | POA: Diagnosis not present

## 2023-05-24 DIAGNOSIS — F1120 Opioid dependence, uncomplicated: Secondary | ICD-10-CM | POA: Diagnosis not present

## 2023-05-24 DIAGNOSIS — M5412 Radiculopathy, cervical region: Secondary | ICD-10-CM | POA: Diagnosis not present

## 2023-05-24 MED ORDER — OXYCODONE-ACETAMINOPHEN 5-325 MG PO TABS
1.0000 | ORAL_TABLET | Freq: Three times a day (TID) | ORAL | 0 refills | Status: DC | PRN
  Filled 2023-07-28: qty 84, 28d supply, fill #0

## 2023-05-24 MED ORDER — OXYCODONE-ACETAMINOPHEN 5-325 MG PO TABS
1.0000 | ORAL_TABLET | Freq: Three times a day (TID) | ORAL | 0 refills | Status: DC | PRN
Start: 2023-06-02 — End: 2023-07-06
  Filled 2023-06-02: qty 84, 28d supply, fill #0

## 2023-05-24 MED ORDER — OXYCODONE-ACETAMINOPHEN 5-325 MG PO TABS
1.0000 | ORAL_TABLET | Freq: Three times a day (TID) | ORAL | 0 refills | Status: DC | PRN
Start: 2023-06-30 — End: 2023-07-06
  Filled 2023-06-30: qty 84, 28d supply, fill #0
  Filled ????-??-??: fill #0

## 2023-05-26 ENCOUNTER — Other Ambulatory Visit: Payer: Self-pay | Admitting: Neurology

## 2023-05-26 ENCOUNTER — Other Ambulatory Visit (HOSPITAL_COMMUNITY): Payer: Self-pay

## 2023-05-26 ENCOUNTER — Other Ambulatory Visit: Payer: Self-pay | Admitting: Cardiovascular Disease

## 2023-05-26 ENCOUNTER — Other Ambulatory Visit: Payer: Self-pay

## 2023-05-26 MED ORDER — AMLODIPINE BESYLATE 5 MG PO TABS
5.0000 mg | ORAL_TABLET | Freq: Every day | ORAL | 3 refills | Status: DC
  Filled 2023-05-26: qty 90, 90d supply, fill #0

## 2023-05-30 ENCOUNTER — Other Ambulatory Visit (HOSPITAL_COMMUNITY): Payer: Self-pay

## 2023-05-30 MED ORDER — TOPIRAMATE 50 MG PO TABS
50.0000 mg | ORAL_TABLET | Freq: Two times a day (BID) | ORAL | 1 refills | Status: DC
  Filled 2023-05-30: qty 180, 90d supply, fill #0
  Filled 2023-09-28: qty 180, 90d supply, fill #1

## 2023-06-01 ENCOUNTER — Other Ambulatory Visit (HOSPITAL_COMMUNITY): Payer: Self-pay

## 2023-06-02 ENCOUNTER — Other Ambulatory Visit: Payer: Self-pay

## 2023-06-02 ENCOUNTER — Other Ambulatory Visit (HOSPITAL_COMMUNITY): Payer: Self-pay

## 2023-06-02 ENCOUNTER — Encounter: Payer: Self-pay | Admitting: Rheumatology

## 2023-06-02 ENCOUNTER — Ambulatory Visit: Payer: Commercial Managed Care - PPO | Admitting: Nurse Practitioner

## 2023-06-02 ENCOUNTER — Other Ambulatory Visit: Payer: Self-pay | Admitting: Physician Assistant

## 2023-06-02 DIAGNOSIS — N3090 Cystitis, unspecified without hematuria: Secondary | ICD-10-CM | POA: Diagnosis not present

## 2023-06-02 DIAGNOSIS — I7300 Raynaud's syndrome without gangrene: Secondary | ICD-10-CM

## 2023-06-02 DIAGNOSIS — R350 Frequency of micturition: Secondary | ICD-10-CM

## 2023-06-02 DIAGNOSIS — R309 Painful micturition, unspecified: Secondary | ICD-10-CM

## 2023-06-02 DIAGNOSIS — R768 Other specified abnormal immunological findings in serum: Secondary | ICD-10-CM

## 2023-06-02 LAB — POCT URINALYSIS DIP (CLINITEK)
Bilirubin, UA: NEGATIVE
Glucose, UA: NEGATIVE mg/dL
Ketones, POC UA: NEGATIVE mg/dL
Nitrite, UA: NEGATIVE
Spec Grav, UA: 1.015 (ref 1.010–1.025)
Urobilinogen, UA: 0.2 U/dL
pH, UA: 7 (ref 5.0–8.0)

## 2023-06-02 MED ORDER — FLUCONAZOLE 150 MG PO TABS
150.0000 mg | ORAL_TABLET | Freq: Once | ORAL | 2 refills | Status: AC
Start: 2023-06-02 — End: 2023-06-04
  Filled 2023-06-02: qty 2, 2d supply, fill #0

## 2023-06-02 MED ORDER — NITROFURANTOIN MONOHYD MACRO 100 MG PO CAPS
ORAL_CAPSULE | ORAL | 3 refills | Status: DC
Start: 2023-06-02 — End: 2023-06-23
  Filled 2023-06-02: qty 40, 30d supply, fill #0

## 2023-06-02 NOTE — Telephone Encounter (Signed)
I spoke with Vernona Rieger.  She stated that she did not have any infection in August and she did not take prednisone.  We will repeat CBC with of along with ANA titer.  Please add CBC with differential as well with ANA titer for [facility].

## 2023-06-02 NOTE — Telephone Encounter (Signed)
All the labs were within normal limits except positive ANA.  We will have to do ANA titer at the follow-up visit.  If patient wants to get ANA titer prior to her visit we can send the lab order.

## 2023-06-02 NOTE — Telephone Encounter (Signed)
Future lab order for ANA has been pended. Please sign. Please see patient's question in mychart message--  please ask Dr. Corliss Skains if I should be concerned over my high WBC count & Neutrophils Absolute?

## 2023-06-06 LAB — URINE CULTURE

## 2023-06-09 DIAGNOSIS — R768 Other specified abnormal immunological findings in serum: Secondary | ICD-10-CM | POA: Diagnosis not present

## 2023-06-13 LAB — CBC WITH DIFFERENTIAL/PLATELET
Basophils Absolute: 0.1 10*3/uL (ref 0.0–0.2)
Basos: 1 %
EOS (ABSOLUTE): 0.1 10*3/uL (ref 0.0–0.4)
Eos: 1 %
Hematocrit: 38.9 % (ref 34.0–46.6)
Hemoglobin: 13.1 g/dL (ref 11.1–15.9)
Immature Grans (Abs): 0 10*3/uL (ref 0.0–0.1)
Immature Granulocytes: 0 %
Lymphocytes Absolute: 2.3 10*3/uL (ref 0.7–3.1)
Lymphs: 26 %
MCH: 31.6 pg (ref 26.6–33.0)
MCHC: 33.7 g/dL (ref 31.5–35.7)
MCV: 94 fL (ref 79–97)
Monocytes Absolute: 0.6 10*3/uL (ref 0.1–0.9)
Monocytes: 7 %
Neutrophils Absolute: 5.6 10*3/uL (ref 1.4–7.0)
Neutrophils: 65 %
Platelets: 293 10*3/uL (ref 150–450)
RBC: 4.14 x10E6/uL (ref 3.77–5.28)
RDW: 12.6 % (ref 11.7–15.4)
WBC: 8.7 10*3/uL (ref 3.4–10.8)

## 2023-06-13 LAB — ANTINUCLEAR ANTIBODIES, IFA

## 2023-06-13 LAB — FANA STAINING PATTERNS: Speckled Pattern: 1:80 {titer}

## 2023-06-13 NOTE — Progress Notes (Signed)
White cell count is normal.  ANA titer is low and not significant.

## 2023-06-19 DIAGNOSIS — N3090 Cystitis, unspecified without hematuria: Secondary | ICD-10-CM

## 2023-06-19 HISTORY — DX: Cystitis, unspecified without hematuria: N30.90

## 2023-06-19 NOTE — Assessment & Plan Note (Signed)
Symptoms and presentation consistent with cystitis. UA positive today. We discussed menopause changes and the contribution of vaginal atrophy to increased risk of UTI. We also discussed consideration of prophylactic treatment following sexual activity to reduce the risks of recurrence.  -Macrobid sent today with fluconazole -Consider utilizing aquaphor for vaginal moisture to reduce risks of vaginal dryness contributing to UTI -Take macrobid 1 dose immediately following sexual activity.  - Urinate before and after sex to reduce the risk of bacteria remaining in the urethra.

## 2023-06-19 NOTE — Progress Notes (Deleted)
Cardiology Office Note:  .   Date:  06/19/2023  ID:  Kelly [name], DOB 1965-10-26, MRN [id number] PCP: Eden Emms, NP  [facility] Providers Cardiologist:  Reatha Harps, MD { Click to update primary MD,subspecialty MD or APP then REFRESH:1}   History of Present Illness: .   Kelly [name] is a 57 y.o. female with history of non-obstructive CAD who presents for follow-up.   She works for [employer].      Problem list 1.  Nonobstructive CAD -Coronary calcium score 69 (92nd percentile) -TPV 265 (32 calcified; 233 non-calcified; 79th percentile) -25-49% mLAD 2.  Hyperlipidemia -T chol 188, LDL 101, TG 153, HDL 61 3. PFO 4.  Hypertension    ROS: All other ROS reviewed and negative. Pertinent positives noted in the HPI.     Studies Reviewed: Marland Kitchen       Physical Exam:   VS:  There were no vitals taken for this visit.   Wt Readings from Last 3 Encounters:  05/10/23 161 lb (73 kg)  04/05/23 165 lb 6.4 oz (75 kg)  03/21/23 165 lb 3.2 oz (74.9 kg)    GEN: Well nourished, well developed in no acute distress NECK: No JVD; No carotid bruits CARDIAC: ***RRR, no murmurs, rubs, gallops RESPIRATORY:  Clear to auscultation without rales, wheezing or rhonchi  ABDOMEN: Soft, non-tender, non-distended EXTREMITIES:  No edema; No deformity  ASSESSMENT AND PLAN: .   ***    {Are you ordering a CV Procedure (e.g. stress test, cath, DCCV, TEE, etc)?   Press F2        :[id number]}   Follow-up: No follow-ups on file.  Time Spent with Patient: I have spent a total of *** minutes caring for this patient today face to face, ordering and reviewing labs/tests, reviewing prior records/medical history, examining the patient, establishing an assessment and plan, communicating results/findings to the patient/family, and documenting in the medical record.   Signed, Lenna Gilford. Flora Lipps, MD, [facility]  [facility]  [address], [address] [place], [place]  [address] [phone]  9:40 PM

## 2023-06-19 NOTE — Progress Notes (Signed)
  [name], DNP, AGNP-c [facility] [address] [place], [place] [address] [phone]   ACUTE VISIT- ESTABLISHED PATIENT  There were no vitals taken for this visit.  Subjective:  HPI Kelly [name] is a 57 y.o. female presents to day for evaluation of acute concern(s).   Arlanda reports concerns with recurrent UTI's with symptoms of frequent urination, dysuria, and spasms present. She reports that she has had an increase in UTI's recently. She typically receives treatment with macrobid.  ROS negative except for what is listed in HPI. History, Medications, Surgery, SDOH, and Family History reviewed and updated as appropriate.  Objective:  Physical Exam Abdominal:     General: There is no distension.     Palpations: Abdomen is soft.     Tenderness: There is abdominal tenderness.     Comments: Suprapubic tenderness          Assessment & Plan:   Problem List Items Addressed This Visit     Cystitis - Primary    Symptoms and presentation consistent with cystitis. UA positive today. We discussed menopause changes and the contribution of vaginal atrophy to increased risk of UTI. We also discussed consideration of prophylactic treatment following sexual activity to reduce the risks of recurrence.  -Macrobid sent today with fluconazole -Consider utilizing aquaphor for vaginal moisture to reduce risks of vaginal dryness contributing to UTI -Take macrobid 1 dose immediately following sexual activity.  - Urinate before and after sex to reduce the risk of bacteria remaining in the urethra.         Relevant Medications   nitrofurantoin, macrocrystal-monohydrate, (MACROBID) 100 MG capsule   Other Visit Diagnoses     Frequent urination       Pain with urination       Relevant Orders   POCT URINALYSIS DIP (CLINITEK) (Completed)   Urine Culture (Completed)         [name], DNP, AGNP-c

## 2023-06-21 ENCOUNTER — Ambulatory Visit: Payer: Commercial Managed Care - PPO | Admitting: Cardiovascular Disease

## 2023-06-21 DIAGNOSIS — E782 Mixed hyperlipidemia: Secondary | ICD-10-CM

## 2023-06-21 DIAGNOSIS — I150 Renovascular hypertension: Secondary | ICD-10-CM

## 2023-06-21 DIAGNOSIS — I2510 Atherosclerotic heart disease of native coronary artery without angina pectoris: Secondary | ICD-10-CM

## 2023-06-22 ENCOUNTER — Other Ambulatory Visit (HOSPITAL_COMMUNITY): Payer: Self-pay

## 2023-06-23 ENCOUNTER — Telehealth (INDEPENDENT_AMBULATORY_CARE_PROVIDER_SITE_OTHER): Payer: Commercial Managed Care - PPO | Admitting: Medical

## 2023-06-23 ENCOUNTER — Other Ambulatory Visit (HOSPITAL_COMMUNITY): Payer: Self-pay

## 2023-06-23 DIAGNOSIS — R051 Acute cough: Secondary | ICD-10-CM | POA: Diagnosis not present

## 2023-06-23 DIAGNOSIS — J988 Other specified respiratory disorders: Secondary | ICD-10-CM

## 2023-06-23 DIAGNOSIS — R0602 Shortness of breath: Secondary | ICD-10-CM | POA: Diagnosis not present

## 2023-06-23 MED ORDER — AZITHROMYCIN 250 MG PO TABS
ORAL_TABLET | ORAL | 0 refills | Status: AC
Start: 2023-06-23 — End: 2023-06-28
  Filled 2023-06-23: qty 6, 5d supply, fill #0

## 2023-06-23 MED ORDER — ALBUTEROL SULFATE HFA 108 (90 BASE) MCG/ACT IN AERS
2.0000 | INHALATION_SPRAY | Freq: Four times a day (QID) | RESPIRATORY_TRACT | 1 refills | Status: DC | PRN
  Filled 2023-06-23: qty 6.7, 25d supply, fill #0
  Filled 2023-08-04: qty 6.7, 25d supply, fill #1

## 2023-06-23 MED ORDER — ONDANSETRON 4 MG PO TBDP
4.0000 mg | ORAL_TABLET | Freq: Three times a day (TID) | ORAL | 0 refills | Status: AC | PRN
Start: 2023-06-23 — End: ?
  Filled 2023-06-23: qty 20, 7d supply, fill #0

## 2023-06-23 NOTE — Progress Notes (Signed)
Subjective:     Patient ID: Kelly [name], female   DOB: 1966/03/18, 57 y.o.   MRN: [id number]  This visit type was conducted due to national recommendations for restrictions regarding the COVID-19 Pandemic (e.g. social distancing) in an effort to limit this patient's exposure and mitigate transmission in our community.  Due to their co-morbid illnesses, this patient is at least at moderate risk for complications without adequate follow up.  This format is felt to be most appropriate for this patient at this time.    Documentation for virtual audio and video telecommunications through [place] encounter:  The patient was located at home. The provider was located in the office. The patient did consent to this visit and is aware of possible charges through their insurance for this visit.  The other persons participating in this telemedicine service were none. Time spent on call was 20 minutes and in review of previous records 20 minutes total.  This virtual service is not related to other E/M service within previous 7 days.   HPI Chief Complaint  Patient presents with   sick    Sick- symptoms on Monday afternoon- sore throat, achy, chills, severe fatigue, SOB, cough and congestion. Cough is yellow/brown. Tested for Covid 3 times all negative. Her symptoms came on quick. Has not gotten flu shot yet   Virtual visit for illness.  Started feeling bad 5 days ago.  Started with a bad sore throat that went away fairly quickly.  By the evening started getting body aches, chills, fatigue, cough, congestion.  Has had a lot of bodyaches and chills all week.  But over the week has gotten worse chest congestion, coughing up yellow and brown mucus, still have terrible aches, still feels short of breath.  Has had some runny nose but not much sneezing.  She has not checked her temperature but thinks she has had probably a fever.  No hemoptysis.  No vomiting but does have decreased appetite and some nausea.   Using DayQuil in the morning and Mucinex multi symptom at night.  No sick contacts.  No other aggravating or relieving factors. No other complaint.   Past Medical History:  Diagnosis Date   Anal fistula    intersphincteric    Anemia    Anticardiolipin antibody positive    Cervicalgia    followed by dr Marikay Alar   Depression    History of herpes genitalis    many yrs ago   History of kidney stones    Hyperlipidemia    Hypertension    followed by pcp   (11-25-2019  per pt had stress test approx. 2018 w/ Dr Donnie Aho, told normal)   Migraines    neurologist--- dr Lucia Gaskins   OSA on CPAP    TMJ (dislocation of temporomandibular joint)    11-25-2019  jaw pops, no appliance   Wears glasses    Current Outpatient Medications on File Prior to Visit  Medication Sig Dispense Refill   acyclovir ointment (ZOVIRAX) 5 % Apply 1 Application topically every 3 (three) hour for 5-7 days 30 g 2   amLODipine (NORVASC) 5 MG tablet Take 1 tablet (5 mg total) by mouth daily. 90 tablet 3   aspirin EC 81 MG tablet Take 1 tablet (81 mg total) by mouth daily. Swallow whole. 90 tablet 3   botulinum toxin Type A (BOTOX) 200 units injection Provider to inject 155 units into the muscles of the head and neck every 12 weeks. Discard remainder. 1 each 3  cyclobenzaprine (FLEXERIL) 5 MG tablet Take 1 tablet (5 mg total) by mouth 3 (three) times daily as needed for muscle spasm 60 tablet 11   estradiol (ESTRACE VAGINAL) 0.1 MG/GM vaginal cream Apply to vagina at bedtime three times a week. 42.5 g 12   fluocinonide cream (LIDEX) 0.05 % Apply topically to affected areas on body 2 (two) times daily for 1-2 weeks as needed for flares of rash 60 g 1   fluticasone (FLONASE) 50 MCG/ACT nasal spray Place 2 sprays into both nostrils daily.     metoprolol tartrate (LOPRESSOR) 100 MG tablet Take 1 tablet (100 mg total) by mouth, TAKEN 2 HOURS PRIOR TO CORONARY CT 1 tablet 0   Multiple Vitamin (MULTIVITAMIN WITH MINERALS) TABS Take  1 tablet by mouth daily.     omeprazole (PRILOSEC) 40 MG capsule Take 1 capsule (40 mg total) by mouth daily. 30 capsule 1   phentermine (ADIPEX-P) 37.5 MG tablet Take 1 tablet (37.5 mg total) by mouth daily before breakfast. 30 tablet 2   pregabalin (LYRICA) 100 MG capsule Take 1 capsule (100 mg total) by mouth 2 (two) times daily. 180 capsule 1   rosuvastatin (CRESTOR) 20 MG tablet Take 1 tablet (20 mg total) by mouth daily. 90 tablet 1   topiramate (TOPAMAX) 50 MG tablet Take 1 tablet (50 mg total) by mouth 2 (two) times daily. 180 tablet 1   triamcinolone cream (KENALOG) 0.1 % Apply 1 Application topically 2 (two) times daily. 45 g 0   valACYclovir (VALTREX) 1000 MG tablet Take 1 tablet (1,000 mg total) by mouth daily. 90 tablet 3   levonorgestrel (MIRENA) 20 MCG/24HR IUD 1 each by Intrauterine route once.     oxyCODONE-acetaminophen (PERCOCET/ROXICET) 5-325 MG tablet Take 1 tablet by mouth every 8 (eight) hours as needed for pain 90 tablet 0   oxyCODONE-acetaminophen (PERCOCET/ROXICET) 5-325 MG tablet Take 1 tablet by mouth every 8 (eight) hours as needed for pain **[place] generic** 90 tablet 0   oxyCODONE-acetaminophen (PERCOCET/ROXICET) 5-325 MG tablet Take 1 tablet by mouth every 8 (eight) hours as needed for pain. 84 tablet 0   oxyCODONE-acetaminophen (PERCOCET/ROXICET) 5-325 MG tablet Take 1 tablet by mouth every 8 (eight) hours as needed for pain 84 tablet 0   oxyCODONE-acetaminophen (PERCOCET/ROXICET) 5-325 MG tablet Take 1 tablet by mouth every 8 (eight) hours as needed for pain 84 tablet 0   oxyCODONE-acetaminophen (PERCOCET/ROXICET) 5-325 MG tablet Take 1 tablet by mouth every 8 (eight) hours as needed for pain. 84 tablet 0   [START ON 07/28/2023] oxyCODONE-acetaminophen (PERCOCET/ROXICET) 5-325 MG tablet Take 1 tablet by mouth every 8 (eight) hours as needed for pain, 28 day supply (DNF 07/28/2023) 84 tablet 0   [START ON 06/30/2023] oxyCODONE-acetaminophen (PERCOCET/ROXICET) 5-325  MG tablet Take 1 tablet by mouth every 8 (eight) hours as needed for pain (DNF 06/30/2023) 84 tablet 0   No current facility-administered medications on file prior to visit.    Review of Systems As in subjective    Objective:   Physical Exam Due to coronavirus pandemic stay at home measures, patient visit was virtual and they were not examined in person.   There were no vitals taken for this visit.  Gen: wd, wn, nad No labored breathing or wheezing      Assessment:     Encounter Diagnoses  Name Primary?   Acute cough Yes   SOB (shortness of breath)    Respiratory tract infection        Plan:  We discussed symptoms, concerns, continue with good hydration and rest over the weekend.  Begin medications below Z-Pak antibiotic, albuterol at least twice a day through the weekend, Zofran as needed.  Can continue DayQuil on the day and Mucinex multi symptom at night through the weekend.  Gave the option of chest x-ray.  If not improving or worse through the weekend such as uncontrolled vomiting, high fever, difficulty breathing that is worsening , then  go to the emergency department   Therisa was seen today for sick.  Diagnoses and all orders for this visit:  Acute cough  SOB (shortness of breath)  Respiratory tract infection  Other orders -     albuterol (VENTOLIN HFA) 108 (90 Base) MCG/ACT inhaler; Inhale 2 puffs into the lungs every 6 (six) hours as needed for wheezing or shortness of breath. -     azithromycin (ZITHROMAX) 250 MG tablet; 2 tablets day 1, then 1 tablet days 2-4 -     ondansetron (ZOFRAN-ODT) 4 MG disintegrating tablet; Take 1 tablet (4 mg total) by mouth every 8 (eight) hours as needed for nausea or vomiting.    F/u prn

## 2023-06-28 ENCOUNTER — Other Ambulatory Visit (HOSPITAL_BASED_OUTPATIENT_CLINIC_OR_DEPARTMENT_OTHER): Payer: Self-pay

## 2023-06-28 ENCOUNTER — Other Ambulatory Visit (HOSPITAL_COMMUNITY): Payer: Self-pay

## 2023-06-28 DIAGNOSIS — D485 Neoplasm of uncertain behavior of skin: Secondary | ICD-10-CM | POA: Diagnosis not present

## 2023-06-28 DIAGNOSIS — L821 Other seborrheic keratosis: Secondary | ICD-10-CM | POA: Diagnosis not present

## 2023-06-28 DIAGNOSIS — L814 Other melanin hyperpigmentation: Secondary | ICD-10-CM | POA: Diagnosis not present

## 2023-06-28 DIAGNOSIS — L570 Actinic keratosis: Secondary | ICD-10-CM | POA: Diagnosis not present

## 2023-06-28 DIAGNOSIS — D225 Melanocytic nevi of trunk: Secondary | ICD-10-CM | POA: Diagnosis not present

## 2023-06-29 ENCOUNTER — Encounter: Payer: Self-pay | Admitting: Nurse Practitioner

## 2023-06-29 ENCOUNTER — Ambulatory Visit: Payer: Commercial Managed Care - PPO | Admitting: Nurse Practitioner

## 2023-06-29 ENCOUNTER — Other Ambulatory Visit (HOSPITAL_COMMUNITY): Payer: Self-pay

## 2023-06-29 VITALS — BP 128/82 | HR 65 | Wt 158.0 lb

## 2023-06-29 DIAGNOSIS — J189 Pneumonia, unspecified organism: Secondary | ICD-10-CM

## 2023-06-29 MED ORDER — LEVOFLOXACIN 500 MG PO TABS
500.0000 mg | ORAL_TABLET | Freq: Every day | ORAL | 0 refills | Status: AC
Start: 2023-06-29 — End: 2023-07-06
  Filled 2023-06-29: qty 7, 7d supply, fill #0

## 2023-06-29 MED ORDER — FLUCONAZOLE 150 MG PO TABS
150.0000 mg | ORAL_TABLET | ORAL | 2 refills | Status: DC
Start: 2023-06-29 — End: 2023-08-13
  Filled 2023-06-29: qty 4, 12d supply, fill #0
  Filled 2023-08-04: qty 4, 12d supply, fill #1

## 2023-06-29 MED ORDER — PREDNISONE 20 MG PO TABS
40.0000 mg | ORAL_TABLET | Freq: Every day | ORAL | 0 refills | Status: DC
Start: 2023-06-29 — End: 2023-08-13
  Filled 2023-06-29: qty 10, 5d supply, fill #0

## 2023-06-29 MED ORDER — METHYLPREDNISOLONE SODIUM SUCC 125 MG IJ SOLR
125.0000 mg | Freq: Once | INTRAMUSCULAR | Status: AC
Start: 2023-06-29 — End: 2023-06-29
  Administered 2023-06-29: 125 mg via INTRAVENOUS

## 2023-06-29 MED ORDER — GUAIFENESIN ER 600 MG PO TB12
1200.0000 mg | ORAL_TABLET | Freq: Two times a day (BID) | ORAL | 0 refills | Status: DC
Start: 2023-06-29 — End: 2023-08-13
  Filled 2023-06-29: qty 40, 10d supply, fill #0

## 2023-06-29 NOTE — Progress Notes (Signed)
[name], DNP, AGNP-c [facility] [address] [place], [place] [address] [phone]   ACUTE VISIT- ESTABLISHED PATIENT  Blood pressure 128/82, pulse 65, weight 158 lb (71.7 kg), SpO2 92%.  Subjective:  HPI Kelly [name] is a 57 y.o. female presents to day for evaluation of acute concern(s).   Emon endorses shortness of breath and cough for over 2 weeks. She was ill last week with an unknown infection, suspected to be viral. URI symptoms and severe fatigue present. Cough and chest congestion have continued with fatigue and dyspnea with mild exertion.  Gay Rape this morning she noted blood in her mucous when coughing. She has completed a course of azithromycin. Subjective fever present last night.    ROS negative except for what is listed in HPI. History, Medications, Surgery, SDOH, and Family History reviewed and updated as appropriate.  Objective:  Physical Exam Vitals and nursing note reviewed.  Constitutional:      General: She is not in acute distress.    Appearance: She is ill-appearing. She is not toxic-appearing.  HENT:     Head: Normocephalic.     Nose: Nose normal.     Mouth/Throat:     Mouth: Mucous membranes are moist.     Pharynx: Oropharynx is clear. Posterior oropharyngeal erythema present.  Eyes:     Conjunctiva/sclera: Conjunctivae normal.  Neck:     Vascular: No carotid bruit.  Cardiovascular:     Rate and Rhythm: Regular rhythm. Tachycardia present.     Pulses: Normal pulses.     Heart sounds: Normal heart sounds.  Pulmonary:     Effort: Tachypnea and accessory muscle usage present.     Breath sounds: Examination of the right-upper field reveals wheezing. Examination of the left-upper field reveals wheezing. Examination of the left-middle field reveals rhonchi. Examination of the left-lower field reveals rhonchi and rales. Wheezing, rhonchi and rales present.     Comments: Tactile fremitus positive in the left lower lobe. Coarse  lung sounds present throughout all lung fields with worse of these being in the left lower lobe. Abdominal:     General: Bowel sounds are normal.     Palpations: Abdomen is soft.  Musculoskeletal:     Cervical back: Normal range of motion and neck supple.     Right lower leg: No edema.     Left lower leg: No edema.  Lymphadenopathy:     Cervical: Cervical adenopathy present.  Skin:    General: Skin is warm and dry.     Capillary Refill: Capillary refill takes less than 2 seconds.     Findings: No rash.  Neurological:     Mental Status: She is alert and oriented to person, place, and time.     Sensory: No sensory deficit.     Motor: Weakness present.  Psychiatric:        Mood and Affect: Mood normal.          Assessment & Plan:   Problem List Items Addressed This Visit     Community acquired pneumonia of left lower lobe of lung - Primary    Symptoms and presentation consistent with CAP secondary to recent URI. Increased risks are present due to smoking status. O2 initially was low on evaluation, but did come up with rest. Max O2 92% in the office today. Strict precautions discussed. Will start Levaquin and steroids with mucinex to aid in expectoration of mucous in the lungs. I would like to recheck her breathing and O2 in  the next couple of days to make sure this is improving. I do feel she is safe to go home. Rest, hydration, and avoidance of crowds recommended to reduce risk of further exposure. Chest x-ray ordered, but we discussed that she can hold off on having this done for a couple of days if she wishes to see how the antibiotics work.       Relevant Medications   levofloxacin (LEVAQUIN) 500 MG tablet   predniSONE (DELTASONE) 20 MG tablet   guaiFENesin (MUCINEX) 600 MG 12 hr tablet   fluconazole (DIFLUCAN) 150 MG tablet   Other Relevant Orders   DG Chest 2 View      [name], DNP, AGNP-c

## 2023-06-29 NOTE — Patient Instructions (Addendum)
Chest x-ray order has been sent. You can wait to see if you feel better before having this done.   I am sending in levaquin for treatment for suspected pneumonia. We have given you a steroid today and I want you to start the other steroid tomorrow morning.  I have given you a sample of Trelegy Elipta. Use this once a day for 14 days. This will open the airways and help with your breathing.  Take mucinex twice a day to help make the cough productive to get the mucous out.   Community-Acquired Pneumonia, Adult Pneumonia is a lung infection that causes inflammation and the buildup of mucus and fluids in the lungs. This may cause coughing and difficulty breathing. Community-acquired pneumonia is pneumonia that develops in people who are not, and have not recently been, in a hospital or other health care facility. Usually, pneumonia develops as a result of an illness that is caused by a virus, such as the common cold and the flu (influenza). It can also be caused by bacteria or fungi. While the common cold and influenza can pass from person to person (are contagious), pneumonia itself is not considered contagious. What are the causes? This condition may be caused by: Viruses. Bacteria. Fungi. What increases the risk? The following factors may make you more likely to develop this condition: Being over age 71 or having certain medical conditions, such as: A long-term (chronic) disease, such as: chronic obstructive pulmonary disease (COPD), asthma, heart failure, diabetes, or kidney disease. A condition that increases the risk of breathing in (aspirating) mucus and other fluids from your mouth and nose. A weakened body defense system (immune system). Having had your spleen removed (splenectomy). The spleen is the organ that helps fight germs and infections. Not cleaning your teeth and gums well (poor dental hygiene). Using tobacco products. Traveling to places where germs that cause pneumonia are  present or being near certain animals or animal habitats that could have germs that cause pneumonia. What are the signs or symptoms? Symptoms of this condition include: A dry cough or a wet (productive) cough. A fever, sweating, or chills. Chest pain, especially when breathing deeply or coughing. Fast breathing, difficulty breathing, or shortness of breath. Tiredness (fatigue) and muscle aches. How is this diagnosed? This condition may be diagnosed based on your medical history or a physical exam. You may also have tests, including: Imaging, such as a chest X-ray or lung ultrasound. Tests of: The level of oxygen and other gases in your blood. Mucus from your lungs (sputum). Fluid around your lungs (pleural fluid). Your urine. How is this treated? Treatment for this condition depends on many factors, such as the cause of your pneumonia, your medicines, and other medical conditions that you have. For most adults, pneumonia may be treated at home. In some cases, treatment must happen in a hospital and may include: Medicines that are given by mouth (orally) or through an IV, including: Antibiotic medicines, if bacteria caused the pneumonia. Medicines that kill viruses (antiviral medicines), if a virus caused the pneumonia. Oxygen therapy. Severe pneumonia, although rare, may require the following treatments: Mechanical ventilation.This procedure uses a machine to help you breathe if you cannot breathe well on your own or maintain a safe level of blood oxygen. Thoracentesis. This procedure removes any buildup of pleural fluid to help with breathing. Follow these instructions at home:  Medicines Take over-the-counter and prescription medicines only as told by your health care provider. Take cough medicine only if you  have trouble sleeping. Cough medicine can prevent your body from removing mucus from your lungs. If you were prescribed antibiotics, take them as told by your health care  provider. Do not stop taking the antibiotic even if you start to feel better. Lifestyle     Do not drink alcohol. Do not use any products that contain nicotine or tobacco. These products include cigarettes, chewing tobacco, and vaping devices, such as e-cigarettes. If you need help quitting, ask your health care provider. Eat a healthy diet. This includes plenty of vegetables, fruits, whole grains, low-fat dairy products, and lean protein. General instructions Rest a lot and get at least 8 hours of sleep each night. Sleep in a partly upright position at night. Place a few pillows under your head or sleep in a reclining chair. Return to your normal activities as told by your health care provider. Ask your health care provider what activities are safe for you. Drink enough fluid to keep your urine pale yellow. This helps to thin the mucus in your lungs. If your throat is sore, gargle with a mixture of salt and water 3-4 times a day or as needed. To make salt water, completely dissolve -1 tsp (3-6 g) of salt in 1 cup (237 mL) of warm water. Keep all follow-up visits. How is this prevented? You can lower your risk of developing community-acquired pneumonia by: Getting the pneumonia vaccine. There are different types and schedules of pneumonia vaccines. Ask your health care provider which option is best for you. Consider getting the pneumonia vaccine if: You are older than 56 years of age. You are 104-31 years of age and are receiving cancer treatment, have chronic lung disease, or have other medical conditions that affect your immune system. Ask your health care provider if this applies to you. Getting your influenza vaccine every year. Ask your health care provider which type of vaccine is best for you. Getting regular dental checkups. Washing your hands often with soap and water for at least 20 seconds. If soap and water are not available, use hand sanitizer. Contact a health care provider  if: You have a fever. You have trouble sleeping because you cannot control your cough with cough medicine. Get help right away if: Your shortness of breath becomes worse. Your chest pain increases. Your sickness becomes worse, especially if you are an older adult or have a weak immune system. You cough up blood. These symptoms may be an emergency. Get help right away. Call 911. Do not wait to see if the symptoms will go away. Do not drive yourself to the hospital. Summary Pneumonia is an infection of the lungs. Community-acquired pneumonia develops in people who have not been in the hospital. It can be caused by bacteria, viruses, or fungi. This condition may be treated with antibiotics or antiviral medicines. Severe pneumonia may require a hospital stay and treatment to help with breathing. This information is not intended to replace advice given to you by your health care provider. Make sure you discuss any questions you have with your health care provider. Document Revised: 10/06/2021 Document Reviewed: 10/06/2021 Elsevier Patient Education  2024 [employer].

## 2023-06-30 ENCOUNTER — Other Ambulatory Visit (HOSPITAL_COMMUNITY): Payer: Self-pay

## 2023-07-03 ENCOUNTER — Ambulatory Visit (INDEPENDENT_AMBULATORY_CARE_PROVIDER_SITE_OTHER): Payer: Commercial Managed Care - PPO | Admitting: Nurse Practitioner

## 2023-07-03 ENCOUNTER — Encounter: Payer: Self-pay | Admitting: Nurse Practitioner

## 2023-07-03 VITALS — BP 132/84 | HR 87 | Wt 158.0 lb

## 2023-07-03 DIAGNOSIS — Z23 Encounter for immunization: Secondary | ICD-10-CM

## 2023-07-03 DIAGNOSIS — R071 Chest pain on breathing: Secondary | ICD-10-CM

## 2023-07-03 NOTE — Progress Notes (Unsigned)
Cardiology Office Note:  .   Date:  07/05/2023  ID:  Kelly [name], DOB 07-Apr-1966, MRN [id number] PCP: Eden Emms, NP  [facility] Providers Cardiologist:  Reatha Harps, MD    History of Present Illness: .   Kelly [name] is a 57 y.o. female with history of nonobstructive CAD, HLD, HTN who presents for follow-up.   History of Present Illness   Kelly [name], a 57 year old with a history of nonobstructive coronary disease, hyperlipidemia, and hypertension, presents with intermittent chest pain. The pain, described as sharp and predominantly on the left, is not necessarily exertional and occurs randomly. The frequency of the pain has been increasing, prompting her to seek medical attention. The pain does not appear to be triggered by any specific activity or stress and is not associated with food intake. She has been taking omeprazole for stomach acid, but does not believe the chest pain is related to acid reflux.  She has been on Crestor for cholesterol management but reports increased aches and pains with daily use, leading to intermittent use of the medication. Previously, she was on Lipitor without any reported side effects. She also takes daily aspirin and amlodipine, the latter of which was prescribed for Raynaud's disease. However, she reports increased leg swelling since starting amlodipine.  She has a strong family history of heart disease, with her mother dying of a sudden heart attack at age 46 and two uncles dying in their fifties. She also has a positive ANA and has been evaluated for possible lupus and other autoimmune conditions.          Problem list 1.  Nonobstructive CAD -Coronary calcium score 69 (92nd percentile) -TPV 265 (32 calcified; 233 non-calcified; 10 low attenuation plaque; 79th percentile) -25-49% mLAD 2.  Hyperlipidemia -T chol 188, LDL 101, TG 153, HDL 61 3. PFO 4.  Raynaud's     ROS: All other ROS reviewed and negative. Pertinent positives  noted in the HPI.     Studies Reviewed: Marland Kitchen        CCTA 06/05/2023  IMPRESSION: 1. Mild CAD in the mid LAD, second diagonal, and distal RCA, 25-49% stenosis, CADRADS 2.   2. Total plaque volume 265 mm3 which is 79th percentile for age- and sex-matched controls (calcified plaque 32 mm3; non-calcified plaque 233 mm3). TPV is severe.   3. Coronary calcium score is 69, which places the patient in the 92nd percentile for age and sex matched control.   4. Normal coronary origins with right dominance.   5.  Mid ascending aorta is mildly dilated at 39 mm.   6.  Small patent foramen ovale with left to right shunt. Physical Exam:   VS:  BP 108/70 (BP Location: Left Arm, Patient Position: Sitting, Cuff Size: Normal)   Pulse 69   Ht 5\' 4"  (1.626 m)   Wt 159 lb (72.1 kg)   SpO2 96%   BMI 27.29 kg/m    Wt Readings from Last 3 Encounters:  07/05/23 159 lb (72.1 kg)  07/03/23 158 lb (71.7 kg)  06/29/23 158 lb (71.7 kg)    GEN: Well nourished, well developed in no acute distress NECK: No JVD; No carotid bruits CARDIAC: RRR, no murmurs, rubs, gallops RESPIRATORY:  Clear to auscultation without rales, wheezing or rhonchi  ABDOMEN: Soft, non-tender, non-distended EXTREMITIES:  No edema; No deformity  ASSESSMENT AND PLAN: .   Assessment and Plan    Nonobstructive Coronary Artery Disease Noted noncalcified plaque on coronary CTA. Patient  reports intermittent chest pain, not associated with exertion or food. No clear triggers identified. Pain described as sharp and predominantly on the left. Does not appear to be cardiac.  -Continue Aspirin daily. -Check LPA, cholesterol, high sensitivity CRP, and uric acid today. -Consider PCSK9 inhibitor therapy (Repatha) due to statin intolerance and high risk features. -Plan to meet with pharmacist to discuss Repatha and initiate therapy.  Hyperlipidemia Patient reports muscle aches with Crestor, leading to inconsistent use. Previous use of Lipitor  without side effects. Last cholesterol check in March showed elevated levels. -Discontinue Crestor. -Check cholesterol today. -Plan to initiate PCSK9 inhibitor therapy (Repatha) pending lab results and discussion with pharmacist.  Raynaud's Patient reports leg swelling with Amlodipine. -Reduce Amlodipine dose to 2.5mg  daily.  General Health Maintenance -Encourage adherence to heart healthy diet and regular exercise (150 minutes of moderate to vigorous physical activity per week). -Plan for yearly follow-up.         Follow-up: Return in about 1 year (around 07/04/2024).  Time Spent with Patient: I have spent a total of 35 minutes caring for this patient today face to face, ordering and reviewing labs/tests, reviewing prior records/medical history, examining the patient, establishing an assessment and plan, communicating results/findings to the patient/family, and documenting in the medical record.   Signed, Lenna Gilford. Flora Lipps, MD, [facility]  [facility]  [address] [place], [place] [address] [phone]  9:17 AM

## 2023-07-03 NOTE — Progress Notes (Signed)
  [name], DNP, AGNP-c [facility] [address] [place], [place] [address] [phone]   ACUTE VISIT- ESTABLISHED PATIENT  Blood pressure 132/84, pulse 87, weight 158 lb (71.7 kg), SpO2 98%.  Subjective:  HPI Kelly [name] is a 57 y.o. female presents to day for evaluation of acute concern(s).   Kelly [name] was recently diagnosed with an upper respiratory infection with significant impact on her breathing. She has been taking her medication as prescribed and reports feeling better at this time. She is still having some pleural type pain on the left shoulder posteriorly. No recurrent fevers.    ROS negative except for what is listed in HPI. History, Medications, Surgery, SDOH, and Family History reviewed and updated as appropriate.  Objective:  Physical Exam Vitals and nursing note reviewed.  Constitutional:      General: She is not in acute distress.    Appearance: Normal appearance. She is not ill-appearing.  HENT:     Head: Normocephalic.  Eyes:     Conjunctiva/sclera: Conjunctivae normal.  Neck:     Vascular: No carotid bruit.  Cardiovascular:     Rate and Rhythm: Normal rate and regular rhythm.     Pulses: Normal pulses.     Heart sounds: Normal heart sounds.  Pulmonary:     Effort: No accessory muscle usage, prolonged expiration, respiratory distress or retractions.     Breath sounds: Examination of the right-middle field reveals wheezing. Examination of the left-middle field reveals wheezing. Wheezing present.     Comments: Pleural rub audible in the left upper lobe on the posterior side of the chest wall consistent with area of pain noted with deep inspiration.  Lymphadenopathy:     Cervical: Cervical adenopathy present.  Skin:    Capillary Refill: Capillary refill takes less than 2 seconds.  Neurological:     General: No focal deficit present.     Mental Status: She is alert and oriented to person, place, and time.  Psychiatric:        Mood  and Affect: Mood normal.          Assessment & Plan:   Problem List Items Addressed This Visit     Pain in the chest    Recent URI appears to be improving with improved oxygenation and lung sounds today. No recurrent fevers are present. There is an audible rub noted posteriorly on the area of the left upper lobe of the chest wall consistent with the pleuritic she has been experiencing. I suspect pleurisy present from recent infection. No concerning symptoms are present at this time. We will continue with anti-inflammatories and consider steroid as needed if symptoms do not improve in the next few days. She is aware of alarm symptoms that would warrant immediate evaluation.       Other Visit Diagnoses     Flu vaccine need    -  Primary         [name], DNP, AGNP-c

## 2023-07-05 ENCOUNTER — Other Ambulatory Visit (HOSPITAL_COMMUNITY): Payer: Self-pay

## 2023-07-05 ENCOUNTER — Ambulatory Visit: Payer: Commercial Managed Care - PPO | Attending: Cardiovascular Disease | Admitting: Cardiovascular Disease

## 2023-07-05 ENCOUNTER — Encounter: Payer: Self-pay | Admitting: Cardiovascular Disease

## 2023-07-05 VITALS — BP 108/70 | HR 69 | Ht 64.0 in | Wt 159.0 lb

## 2023-07-05 DIAGNOSIS — I2510 Atherosclerotic heart disease of native coronary artery without angina pectoris: Secondary | ICD-10-CM | POA: Diagnosis not present

## 2023-07-05 DIAGNOSIS — E782 Mixed hyperlipidemia: Secondary | ICD-10-CM | POA: Diagnosis not present

## 2023-07-05 MED ORDER — AMLODIPINE BESYLATE 2.5 MG PO TABS
2.5000 mg | ORAL_TABLET | Freq: Every day | ORAL | 3 refills | Status: DC
  Filled 2023-07-05: qty 90, 90d supply, fill #0
  Filled 2023-11-27: qty 90, 90d supply, fill #1
  Filled 2024-02-21: qty 90, 90d supply, fill #2
  Filled 2024-05-21 (×2): qty 90, 90d supply, fill #3

## 2023-07-05 NOTE — Patient Instructions (Signed)
Medication Instructions:  DECREASE amlodipine to 2.5mg  daily  *If you need a refill on your cardiac medications before your next appointment, please call your pharmacy*   Lab Work: Lipid Panel, LPa, High-Sensitivity CRP, uric acid  If you have labs (blood work) drawn today and your tests are completely normal, you will receive your results only by: MyChart Message (if you have MyChart) OR A paper copy in the mail If you have any lab test that is abnormal or we need to change your treatment, we will call you to review the results.   Follow-Up: At [facility], you and your health needs are our priority.  As part of our continuing mission to provide you with exceptional heart care, we have created designated Provider Care Teams.  These Care Teams include your primary Cardiologist (physician) and Advanced Practice Providers (APPs -  Physician Assistants and Nurse Practitioners) who all work together to provide you with the care you need, when you need it.  We recommend signing up for the patient portal called "MyChart".  Sign up information is provided on this After Visit Summary.  MyChart is used to connect with patients for Virtual Visits (Telemedicine).  Patients are able to view lab/test results, encounter notes, upcoming appointments, etc.  Non-urgent messages can be sent to your provider as well.   To learn more about what you can do with MyChart, go to [email or web address].    Your next appointment:   12 months with Dr. Flora Lipps  Other Instructions You have been referred to our clinical pharmacy team to discuss an injectable cholesterol medication

## 2023-07-06 ENCOUNTER — Other Ambulatory Visit: Payer: Self-pay

## 2023-07-06 ENCOUNTER — Encounter: Payer: Self-pay | Admitting: Nurse Practitioner

## 2023-07-06 DIAGNOSIS — J189 Pneumonia, unspecified organism: Secondary | ICD-10-CM | POA: Insufficient documentation

## 2023-07-06 HISTORY — DX: Pneumonia, unspecified organism: J18.9

## 2023-07-06 LAB — HIGH SENSITIVITY CRP: CRP, High Sensitivity: 0.76 mg/L (ref 0.00–3.00)

## 2023-07-06 LAB — LIPID PANEL
Chol/HDL Ratio: 2.2 ratio (ref 0.0–4.4)
Cholesterol, Total: 164 mg/dL (ref 100–199)
HDL: 73 mg/dL (ref >39)
LDL Chol Calc (NIH): 77 mg/dL (ref 0–99)
Triglycerides: 73 mg/dL (ref 0–149)
VLDL Cholesterol Cal: 14 mg/dL (ref 5–40)

## 2023-07-06 LAB — LIPOPROTEIN A (LPA): Lipoprotein (a): 128 nmol/L — ABNORMAL HIGH (ref <75.0)

## 2023-07-06 LAB — URIC ACID: Uric Acid: 3.2 mg/dL (ref 3.0–7.2)

## 2023-07-06 NOTE — Assessment & Plan Note (Addendum)
Symptoms and presentation consistent with CAP secondary to recent URI. Increased risks are present due to smoking status. O2 initially was low on evaluation, but did come up with rest. Max O2 92% in the office today. Strict precautions discussed. Will start Levaquin and steroids with mucinex to aid in expectoration of mucous in the lungs. I would like to recheck her breathing and O2 in the next couple of days to make sure this is improving. I do feel she is safe to go home. Rest, hydration, and avoidance of crowds recommended to reduce risk of further exposure. Chest x-ray ordered, but we discussed that she can hold off on having this done for a couple of days if she wishes to see how the antibiotics work.

## 2023-07-07 ENCOUNTER — Other Ambulatory Visit: Payer: Self-pay

## 2023-07-07 ENCOUNTER — Other Ambulatory Visit (HOSPITAL_COMMUNITY): Payer: Self-pay

## 2023-07-10 ENCOUNTER — Other Ambulatory Visit: Payer: Self-pay

## 2023-07-10 ENCOUNTER — Encounter: Payer: Self-pay | Admitting: Neurology

## 2023-07-11 ENCOUNTER — Other Ambulatory Visit: Payer: Self-pay

## 2023-07-11 NOTE — Assessment & Plan Note (Signed)
Recent URI appears to be improving with improved oxygenation and lung sounds today. No recurrent fevers are present. There is an audible rub noted posteriorly on the area of the left upper lobe of the chest wall consistent with the pleuritic she has been experiencing. I suspect pleurisy present from recent infection. No concerning symptoms are present at this time. We will continue with anti-inflammatories and consider steroid as needed if symptoms do not improve in the next few days. She is aware of alarm symptoms that would warrant immediate evaluation.

## 2023-07-11 NOTE — Progress Notes (Signed)
Specialty Pharmacy Refill Coordination Note  Kelly [name] is a 57 y.o. female contacted today regarding refills of specialty medication(s) Onabotulinumtoxina   Patient requested Courier to Provider Office   Delivery date: 07/24/23   Verified address: [facility] [address] [place]   Medication will be filled on 07/21/23.

## 2023-07-13 ENCOUNTER — Ambulatory Visit: Payer: Commercial Managed Care - PPO | Admitting: Neurology

## 2023-07-21 ENCOUNTER — Other Ambulatory Visit: Payer: Self-pay

## 2023-07-25 ENCOUNTER — Other Ambulatory Visit (HOSPITAL_COMMUNITY): Payer: Self-pay

## 2023-07-25 ENCOUNTER — Other Ambulatory Visit: Payer: Self-pay

## 2023-07-26 ENCOUNTER — Other Ambulatory Visit (HOSPITAL_COMMUNITY): Payer: Self-pay

## 2023-07-26 ENCOUNTER — Encounter (HOSPITAL_COMMUNITY): Payer: Self-pay

## 2023-07-26 ENCOUNTER — Other Ambulatory Visit: Payer: Self-pay

## 2023-07-26 NOTE — Telephone Encounter (Signed)
Med refill request: Phentermine 37.5  Last AEX: 11/09/22 Next AEX: not yet scheduled  Last MMG (if hormonal med) na  Refill authorized: Please Advise?

## 2023-07-28 ENCOUNTER — Other Ambulatory Visit (HOSPITAL_COMMUNITY): Payer: Self-pay

## 2023-07-28 ENCOUNTER — Other Ambulatory Visit: Payer: Self-pay

## 2023-07-31 ENCOUNTER — Telehealth: Payer: Self-pay | Admitting: Neurology

## 2023-07-31 NOTE — Telephone Encounter (Signed)
Addressed in previous 07/10/23 Botox encounter.

## 2023-07-31 NOTE — Telephone Encounter (Signed)
Pt calling to reschedule botox appt. Reached out to [facility]; per Jillian please tell her I'll talk with Dr. Lucia Gaskins because she wanted her worked in and get back with her.

## 2023-07-31 NOTE — Telephone Encounter (Signed)
Pt called in to reschedule, I wanted to touch base to see where you wanted to get her worked in, see below [employer]. I don't currently see anything with you or Amy for this month. Thank you!

## 2023-08-04 ENCOUNTER — Other Ambulatory Visit: Payer: Self-pay

## 2023-08-04 ENCOUNTER — Other Ambulatory Visit (HOSPITAL_COMMUNITY): Payer: Self-pay

## 2023-08-04 DIAGNOSIS — G4733 Obstructive sleep apnea (adult) (pediatric): Secondary | ICD-10-CM | POA: Diagnosis not present

## 2023-08-07 ENCOUNTER — Other Ambulatory Visit (HOSPITAL_COMMUNITY): Payer: Self-pay

## 2023-08-08 ENCOUNTER — Ambulatory Visit: Payer: Commercial Managed Care - PPO

## 2023-08-10 ENCOUNTER — Telehealth: Payer: Self-pay | Admitting: Pharmacist

## 2023-08-10 ENCOUNTER — Other Ambulatory Visit (HOSPITAL_COMMUNITY): Payer: Self-pay

## 2023-08-10 ENCOUNTER — Telehealth: Payer: Self-pay | Admitting: Pharmacy Technician

## 2023-08-10 ENCOUNTER — Ambulatory Visit: Payer: Commercial Managed Care - PPO | Attending: Internal Medicine | Admitting: Student

## 2023-08-10 ENCOUNTER — Ambulatory Visit: Payer: Commercial Managed Care - PPO | Admitting: Neurology

## 2023-08-10 ENCOUNTER — Encounter: Payer: Self-pay | Admitting: Student

## 2023-08-10 DIAGNOSIS — E7849 Other hyperlipidemia: Secondary | ICD-10-CM | POA: Diagnosis not present

## 2023-08-10 DIAGNOSIS — G43709 Chronic migraine without aura, not intractable, without status migrainosus: Secondary | ICD-10-CM

## 2023-08-10 DIAGNOSIS — E782 Mixed hyperlipidemia: Secondary | ICD-10-CM

## 2023-08-10 MED ORDER — ONABOTULINUMTOXINA 200 UNITS IJ SOLR
155.0000 [IU] | Freq: Once | INTRAMUSCULAR | Status: AC
  Administered 2023-08-10: 155 [IU] via INTRAMUSCULAR

## 2023-08-10 MED ORDER — ATORVASTATIN CALCIUM 10 MG PO TABS
10.0000 mg | ORAL_TABLET | Freq: Every day | ORAL | 3 refills | Status: DC
  Filled 2023-08-10: qty 90, 90d supply, fill #0
  Filled 2023-11-27: qty 90, 90d supply, fill #1
  Filled 2024-02-21: qty 90, 90d supply, fill #2
  Filled 2024-05-21 (×2): qty 90, 90d supply, fill #3

## 2023-08-10 NOTE — Telephone Encounter (Signed)
Pharmacy Patient Advocate Encounter   Received notification from Pt Calls Messages that prior authorization for repatha is required/requested.   Insurance verification completed.   The patient is insured through [facility] .   Per test claim: PA required; PA submitted to above mentioned insurance via CoverMyMeds Key/confirmation #/EOC [id number] Status is pending

## 2023-08-10 NOTE — Progress Notes (Signed)
Botox- 200 units x 1 vial Lot: [id number] Expiration: 04/2025 NDC: 6301-6010-93  Bacteriostatic 0.9% Sodium Chloride- 4 mL  Lot: [id number] Expiration: 06/22/2024 NDC: 2202-5427-06  Dx: C37.628 S/P Witnessed by Cheron Every CMA

## 2023-08-10 NOTE — Progress Notes (Signed)
Consent Form Botulism Toxin Injection For Chronic Migraine 08/13/2023: Stable, still doing excellent! Patient feels that her migraines cause her jaw to ache in her jaw aching also can cause her migraines to worsen it is a trigger for migraines included 5 units in each masseter to see if that helps with migraine severity.high up in the forhead so now forehead droop,  04/19/2023: doing excellent, stable or better  01/24/2023: doong great, daughter graduated HS and will see me for migraines. minimum > 60% - 70 improvement in migraine and headache frequency  08/08/2022: stable doing well, minimum > 60% - 70 improvement in migraine and headache frequency  05/12/2022: She will get migrianes 2 weeks after botox when the botox has worn off. This has happened a few times, takes nurtec and then kicks in. after that she does great no more than 4 migraine days a month and no headache days. At baseline she had daily headaches prior to botox.   02/17/2022: >>90% improvement in migraine frequency.  Reviewed orally with patient, additionally signature is on file:  Botulism toxin has been approved by the Federal drug administration for treatment of chronic migraine. Botulism toxin does not cure chronic migraine and it may not be effective in some patients.  The administration of botulism toxin is accomplished by injecting a small amount of toxin into the muscles of the neck and head. Dosage must be titrated for each individual. Any benefits resulting from botulism toxin tend to wear off after 3 months with a repeat injection required if benefit is to be maintained. Injections are usually done every 3-4 months with maximum effect peak achieved by about 2 or 3 weeks. Botulism toxin is expensive and you should be sure of what costs you will incur resulting from the injection.  The side effects of botulism toxin use for chronic migraine may include:   -Transient, and usually mild, facial weakness with facial  injections  -Transient, and usually mild, head or neck weakness with head/neck injections  -Reduction or loss of forehead facial animation due to forehead muscle weakness  -Eyelid drooping  -Dry eye  -Pain at the site of injection or bruising at the site of injection  -Double vision  -Potential unknown long term risks  Contraindications: You should not have Botox if you are pregnant, nursing, allergic to albumin, have an infection, skin condition, or muscle weakness at the site of the injection, or have myasthenia gravis, Lambert-Eaton syndrome, or ALS.  It is also possible that as with any injection, there may be an allergic reaction or no effect from the medication. Reduced effectiveness after repeated injections is sometimes seen and rarely infection at the injection site may occur. All care will be taken to prevent these side effects. If therapy is given over a long time, atrophy and wasting in the muscle injected may occur. Occasionally the patient's become refractory to treatment because they develop antibodies to the toxin. In this event, therapy needs to be modified.  I have read the above information and consent to the administration of botulism toxin.    BOTOX PROCEDURE NOTE FOR MIGRAINE HEADACHE    Contraindications and precautions discussed with patient(above). Aseptic procedure was observed and patient tolerated procedure. Procedure performed by Dr. Artemio Aly  The condition has existed for more than 6 months, and pt does not have a diagnosis of ALS, Myasthenia Gravis or Lambert-Eaton Syndrome.  Risks and benefits of injections discussed and pt agrees to proceed with the procedure.  Written consent obtained  These  injections are medically necessary. Pt  receives good benefits from these injections. These injections do not cause sedations or hallucinations which the oral therapies may cause.  Description of procedure:  The patient was placed in a sitting position. The  standard protocol was used for Botox as follows, with 5 units of Botox injected at each site:   -Procerus muscle, midline injection  -Corrugator muscle, bilateral injection  -Frontalis muscle, bilateral injection, with 2 sites each side, medial injection was performed in the upper one third of the frontalis muscle, in the region vertical from the medial inferior edge of the superior orbital rim. The lateral injection was again in the upper one third of the forehead vertically above the lateral limbus of the cornea, 1.5 cm lateral to the medial injection site.  -Temporalis muscle injection, 4 sites, bilaterally. The first injection was 3 cm above the tragus of the ear, second injection site was 1.5 cm to 3 cm up from the first injection site in line with the tragus of the ear. The third injection site was 1.5-3 cm forward between the first 2 injection sites. The fourth injection site was 1.5 cm posterior to the second injection site.   -Occipitalis muscle injection, 3 sites, bilaterally. The first injection was done one half way between the occipital protuberance and the tip of the mastoid process behind the ear. The second injection site was done lateral and superior to the first, 1 fingerbreadth from the first injection. The third injection site was 1 fingerbreadth superiorly and medially from the first injection site.  -Cervical paraspinal muscle injection, 2 sites, bilateral knee first injection site was 1 cm from the midline of the cervical spine, 3 cm inferior to the lower border of the occipital protuberance. The second injection site was 1.5 cm superiorly and laterally to the first injection site.  -Trapezius muscle injection was performed at 3 sites, bilaterally. The first injection site was in the upper trapezius muscle halfway between the inflection point of the neck, and the acromion. The second injection site was one half way between the acromion and the first injection site. The third  injection was done between the first injection site and the inflection point of the neck.   Will return for repeat injection in 3 months.   155 units of Botox was used, 45u Botox not injected was wasted. The patient tolerated the procedure well, there were no complications of the above procedure.

## 2023-08-10 NOTE — Progress Notes (Signed)
Patient ID: Kelly [name]                 DOB: 05-15-1966                    MRN: [id number]      HPI: Kelly [name] is a 57 y.o. female patient referred to lipid clinic by Dr.O'Neal. PMH is significant for nonobstructive CAD, HLD, HTN   She has been on Crestor for cholesterol management but reports increased aches and pains with daily use, leading to intermittent use of the medication. Previously, she was on Lipitor without any reported side effects.    Patient presented today for lipid clinic. Reports she can not tolerate Crestor but she was able to some what tolerate Lipitor. Lipitor 20 mg was causing aches and pain she is willing to try lower dose to see if she could tolerate 10 mg dose. Reports her daughter was prescribed Vascepa which she was not able to tolerate so patient started taking 1 gm twice daily that is why her TG level at the last lipid lab was improved   Reviewed options for lowering LDL cholesterol, including ezetimibe, PCSK-9 inhibitors, bempedoic acid and inclisiran.  Discussed mechanisms of action, dosing, side effects and potential decreases in LDL cholesterol.  Also reviewed cost information and potential options for patient assistance.  Current Medications: Vascepa 1 gm twice daily  Intolerances: Crestor 20 mg - muscle pain Risk Factors: premature CAD with intermittent chest pain , HLD, HTN  LDL goal: <55 mg/dl  Last lab: Lp(a) 409, TG 73, TC 164, HDL 73, LDLc 77  Diet: working on diet for sometime   Exercise: walks a lot and stays extra active to get some exercise over weekends   Family History:      Relation Problem Comments  Mother (Deceased) Heart attack   Heart disease (Age: 38) died suddenly of MI    Father (Deceased) Cancer colon cancer  Diabetes   Hypertension   Kidney Stones     Sister (Deceased) Diabetes   Stroke     Sister Metallurgist)   Brother (Alive)   Maternal Uncle (Deceased) Heart disease (Age: 77) died of MI    Maternal Uncle (Deceased)  Heart disease     Maternal Grandmother (Deceased) Breast cancer     Maternal Grandfather (Deceased)   Paternal Grandmother (Deceased)   Paternal Grandfather (Deceased)   Daughter Metallurgist) Healthy     Daughter (Alive) Healthy     Son Metallurgist) Mental illness       Social History:  Alcohol: rare Smoking: vape -occasionally   Labs: Lipid Panel     Component Value Date/Time   CHOL 164 07/05/2023 0842   TRIG 73 07/05/2023 0842   HDL 73 07/05/2023 0842   CHOLHDL 2.2 07/05/2023 0842   CHOLHDL 4.2 11/01/2019 0957   VLDL NOT CALC 05/17/2016 1442   LDLCALC 77 07/05/2023 0842   LDLCALC 117 (H) 11/01/2019 0957   LABVLDL 14 07/05/2023 0842    Past Medical History:  Diagnosis Date   Acute back pain 04/17/2018   Anal fistula    intersphincteric    Anemia    Anticardiolipin antibody positive    Cervicalgia    followed by dr Marikay Alar   Coronary artery disease    Cystitis 06/19/2023   Depression    History of herpes genitalis    many yrs ago   History of kidney stones    Hyperlipidemia    Hypertension  followed by pcp   (11-25-2019  per pt had stress test approx. 2018 w/ Dr Donnie Aho, told normal)   Migraines    neurologist--- dr Lucia Gaskins   OSA on CPAP    TMJ (dislocation of temporomandibular joint)    11-25-2019  jaw pops, no appliance   Wears glasses     Current Outpatient Medications on File Prior to Visit  Medication Sig Dispense Refill   acyclovir ointment (ZOVIRAX) 5 % Apply 1 Application topically every 3 (three) hour for 5-7 days 30 g 2   albuterol (VENTOLIN HFA) 108 (90 Base) MCG/ACT inhaler Inhale 2 puffs into the lungs every 6 (six) hours as needed for wheezing or shortness of breath. 6.7 g 1   amLODipine (NORVASC) 2.5 MG tablet Take 1 tablet (2.5 mg total) by mouth daily. 90 tablet 3   aspirin EC 81 MG tablet Take 1 tablet (81 mg total) by mouth daily. Swallow whole. 90 tablet 3   botulinum toxin Type A (BOTOX) 200 units injection Provider to inject 155 units  into the muscles of the head and neck every 12 weeks. Discard remainder. 1 each 3   cyclobenzaprine (FLEXERIL) 5 MG tablet Take 1 tablet (5 mg total) by mouth 3 (three) times daily as needed for muscle spasm 60 tablet 11   estradiol (ESTRACE VAGINAL) 0.1 MG/GM vaginal cream Apply to vagina at bedtime three times a week. 42.5 g 12   fluconazole (DIFLUCAN) 150 MG tablet Take 1 tablet (150 mg total) by mouth every 3 days for 4 doses (Patient not taking: Reported on 07/05/2023) 4 tablet 2   fluocinonide cream (LIDEX) 0.05 % Apply topically to affected areas on body 2 (two) times daily for 1-2 weeks as needed for flares of rash 60 g 1   fluticasone (FLONASE) 50 MCG/ACT nasal spray Place 2 sprays into both nostrils daily.     guaiFENesin (MUCINEX) 600 MG 12 hr tablet Take 2 tablets (1,200 mg total) by mouth 2 (two) times daily. 40 tablet 0   levonorgestrel (MIRENA) 20 MCG/24HR IUD 1 each by Intrauterine route once.     Multiple Vitamin (MULTIVITAMIN WITH MINERALS) TABS Take 1 tablet by mouth daily.     omeprazole (PRILOSEC) 40 MG capsule Take 1 capsule (40 mg total) by mouth daily. (Patient not taking: Reported on 07/05/2023) 30 capsule 1   ondansetron (ZOFRAN-ODT) 4 MG disintegrating tablet Dissolve 1 tablet (4 mg total) by mouth every 8 (eight) hours as needed for nausea or vomiting. 20 tablet 0   oxyCODONE-acetaminophen (PERCOCET/ROXICET) 5-325 MG tablet Take 1 tablet by mouth every 8 (eight) hours as needed for pain, 28 day supply (DNF 07/28/2023) 84 tablet 0   phentermine (ADIPEX-P) 37.5 MG tablet Take 1 tablet (37.5 mg total) by mouth daily before breakfast. 30 tablet 2   predniSONE (DELTASONE) 20 MG tablet Take 2 tablets (40 mg total) by mouth daily with breakfast. 10 tablet 0   pregabalin (LYRICA) 100 MG capsule Take 1 capsule (100 mg total) by mouth 2 (two) times daily. 180 capsule 1   topiramate (TOPAMAX) 50 MG tablet Take 1 tablet (50 mg total) by mouth 2 (two) times daily. 180 tablet 1    triamcinolone cream (KENALOG) 0.1 % Apply 1 Application topically 2 (two) times daily. 45 g 0   valACYclovir (VALTREX) 1000 MG tablet Take 1 tablet (1,000 mg total) by mouth daily. 90 tablet 3   No current facility-administered medications on file prior to visit.    Allergies  Allergen Reactions  Imitrex [Sumatriptan Base] Other (See Comments)    Esophageal discomfort most likely serotonin effect   Zomig Other (See Comments)    Throat discomfort, most likely serotonin effect    Avelox [Moxifloxacin Hcl In Nacl] Hives   Morphine Nausea And Vomiting   Septra [Bactrim] Hives   Sulfamethoxazole    Sumatriptan Other (See Comments)   Trimethoprim     Assessment/Plan:  1. Hyperlipidemia -  Problem  Hyperlipidemia   Current Medications: Vascepa 1 gm twice daily  Intolerances: Crestor 20 mg - muscle pain Risk Factors: premature CAD with intermittent chest pain , HLD, HTN  LDL goal: <55 mg/dl  Last lab: Lp(a) 528, TG 73, TC 164, HDL 73, LDLc 77    Hyperlipidemia Assessment:  LDL goal: <55 mg/dl last LDLc  77  mg/dl while on Crestor 20 mg daily  Intolerance to Crestor 20 mg - muscle/joint pain   Was on Lipitor 20 mg - pain was manageable  Discussed next potential options (ezetimibe, PCSK-9 inhibitors, bempedoic acid and inclisiran); cost, dosing efficacy, side effects  Willing to try 10 mg Lipitor and will consider PCSK9i as add on  Diet has improved a lot over the past year and stays active around the house   Plan: Start taking Lipitor 10 mg daily if not tolerated well reduce dose to 10 mg every other day will follow up via phone in 2 weeks  Will apply for PA for PCSK9i; will inform patient upon approval  Lipid lab due in 2-3 months of therapy optimization     Thank you,  Carmela Hurt, Pharm.D  [facility] [address], [place], [place] [address]  Phone: [phone]; Fax: [phone]

## 2023-08-10 NOTE — Patient Instructions (Signed)
Your Results:             Your most recent labs Goal  Total Cholesterol 164 < 200  Triglycerides 73 < 150  HDL (happy/good cholesterol) 73 > 40  LDL (lousy/bad cholesterol 77 < 55   Medication changes: Start taking Lipitor 10 mg daily if not tolerated well reduce dose to 10 mg every other day will follow up via phone in 2 weeks  We will start the process to get PCSK9i (Repatha or Praluent)  covered by your insurance.  Once the prior authorization is complete, we will call you to let you know and confirm pharmacy information.     Praluent is a cholesterol medication that improved your body's ability to get rid of "bad cholesterol" known as LDL. It can lower your LDL up to 60%. It is an injection that is given under the skin every 2 weeks. The most common side effects of Praluent include runny nose, symptoms of the common cold, rarely flu or flu-like symptoms, back/muscle pain in about 3-4% of the patients, and redness, pain, or bruising at the injection site.    Repatha is a cholesterol medication that improved your body's ability to get rid of "bad cholesterol" known as LDL. It can lower your LDL up to 60%! It is an injection that is given under the skin every 2 weeks. The most common side effects of Repatha include runny nose, symptoms of the common cold, rarely flu or flu-like symptoms, back/muscle pain in about 3-4% of the patients, and redness, pain, or bruising at the injection site.   Lab orders: We want to repeat labs after 2-3 months.  We will send you a lab order to remind you once we get closer to that time.

## 2023-08-10 NOTE — Assessment & Plan Note (Signed)
Assessment:  LDL goal: <55 mg/dl last LDLc  77  mg/dl while on Crestor 20 mg daily  Intolerance to Crestor 20 mg - muscle/joint pain   Was on Lipitor 20 mg - pain was manageable  Discussed next potential options (ezetimibe, PCSK-9 inhibitors, bempedoic acid and inclisiran); cost, dosing efficacy, side effects  Willing to try 10 mg Lipitor and will consider PCSK9i as add on  Diet has improved a lot over the past year and stays active around the house   Plan: Start taking Lipitor 10 mg daily if not tolerated well reduce dose to 10 mg every other day will follow up via phone in 2 weeks  Will apply for PA for PCSK9i; will inform patient upon approval  Lipid lab due in 2-3 months of therapy optimization

## 2023-08-14 ENCOUNTER — Other Ambulatory Visit (HOSPITAL_COMMUNITY): Payer: Self-pay

## 2023-08-14 ENCOUNTER — Encounter: Payer: Self-pay | Admitting: Pharmacist

## 2023-08-14 MED ORDER — REPATHA SURECLICK 140 MG/ML ~~LOC~~ SOAJ
140.0000 mg | SUBCUTANEOUS | 3 refills | Status: DC
  Filled 2023-08-14: qty 6, 84d supply, fill #0
  Filled 2023-11-27: qty 2, 28d supply, fill #1
  Filled 2023-12-20: qty 2, 28d supply, fill #2
  Filled 2024-02-21: qty 2, 28d supply, fill #3
  Filled 2024-05-21 (×2): qty 2, 28d supply, fill #4
  Filled 2024-06-27: qty 2, 28d supply, fill #5
  Filled 2024-07-22: qty 2, 28d supply, fill #6

## 2023-08-14 NOTE — Telephone Encounter (Signed)
PA approved, see other encounter.

## 2023-08-14 NOTE — Telephone Encounter (Signed)
Pharmacy Patient Advocate Encounter  Received notification from [facility] that Prior Authorization for repatha has been APPROVED from 08/12/23 to 08/10/24. Ran test claim, Copay is $0.00 one month. This test claim was processed through [facility]- copay amounts may vary at other pharmacies due to pharmacy/plan contracts, or as the patient moves through the different stages of their insurance plan.   PA #/Case ID/Reference #: [id number]

## 2023-08-14 NOTE — Addendum Note (Signed)
Addended by: Tylene Fantasia on: 08/14/2023 02:29 PM   Modules accepted: Orders

## 2023-08-15 ENCOUNTER — Other Ambulatory Visit (HOSPITAL_COMMUNITY): Payer: Self-pay

## 2023-08-18 ENCOUNTER — Other Ambulatory Visit (HOSPITAL_COMMUNITY): Payer: Self-pay

## 2023-08-18 ENCOUNTER — Ambulatory Visit (INDEPENDENT_AMBULATORY_CARE_PROVIDER_SITE_OTHER): Payer: Commercial Managed Care - PPO | Admitting: Medical

## 2023-08-18 VITALS — BP 124/80 | HR 63

## 2023-08-18 DIAGNOSIS — M25512 Pain in left shoulder: Secondary | ICD-10-CM

## 2023-08-18 DIAGNOSIS — G8929 Other chronic pain: Secondary | ICD-10-CM

## 2023-08-18 DIAGNOSIS — M19012 Primary osteoarthritis, left shoulder: Secondary | ICD-10-CM | POA: Diagnosis not present

## 2023-08-18 DIAGNOSIS — M542 Cervicalgia: Secondary | ICD-10-CM

## 2023-08-18 DIAGNOSIS — M62838 Other muscle spasm: Secondary | ICD-10-CM | POA: Diagnosis not present

## 2023-08-18 MED ORDER — PREDNISONE 10 MG PO TABS
ORAL_TABLET | ORAL | 0 refills | Status: AC
  Filled 2023-08-18: qty 21, 6d supply, fill #0

## 2023-08-18 NOTE — Progress Notes (Signed)
Subjective:  Kelly [name] is a 57 y.o. female who presents for Chief Complaint  Patient presents with   Shoulder Pain    Shoulder pain x 3-4 days. Lifting boxes and other things over the holiday. Taken otc tylenlol and Ibuprofen     Here for concern of the shoulder pain.  Has been pain the last 2 to 4 days in the neck, left upper back and left shoulder.  She is right-handed.  No recent fall or injury but has been caring boxes and items back-and-forth up-and-down stairs at her house from Christmas decorations and things.  She has been using her arms a lot more in the last week  She feels general achiness throughout the shoulder.  Feels like it could be swollen inside her shoulder.  She normally gets popping in her shoulder but currently there is no popping and more pain so she send there is swelling on the inside  No new numbness or tingling.  She does have chronic neck pain, history of neck surgery and is on medications for chronic pain  She has been using 800 mg of ibuprofen 2-3 times a day last few days in addition to her regimen of pain medicine  No other aggravating or relieving factors.    No other c/o.  Past Medical History:  Diagnosis Date   Acute back pain 04/17/2018   Anal fistula    intersphincteric    Anemia    Anticardiolipin antibody positive    Cervicalgia    followed by dr Marikay Alar   Coronary artery disease    Cystitis 06/19/2023   Depression    History of herpes genitalis    many yrs ago   History of kidney stones    Hyperlipidemia    Hypertension    followed by pcp   (11-25-2019  per pt had stress test approx. 2018 w/ Dr Donnie Aho, told normal)   Migraines    neurologist--- dr Lucia Gaskins   OSA on CPAP    TMJ (dislocation of temporomandibular joint)    11-25-2019  jaw pops, no appliance   Wears glasses    Current Outpatient Medications on File Prior to Visit  Medication Sig Dispense Refill   acyclovir ointment (ZOVIRAX) 5 % Apply 1 Application topically  every 3 (three) hour for 5-7 days 30 g 2   amLODipine (NORVASC) 2.5 MG tablet Take 1 tablet (2.5 mg total) by mouth daily. 90 tablet 3   aspirin EC 81 MG tablet Take 1 tablet (81 mg total) by mouth daily. Swallow whole. 90 tablet 3   atorvastatin (LIPITOR) 10 MG tablet Take 1 tablet (10 mg total) by mouth daily. 90 tablet 3   botulinum toxin Type A (BOTOX) 200 units injection Provider to inject 155 units into the muscles of the head and neck every 12 weeks. Discard remainder. 1 each 3   cyclobenzaprine (FLEXERIL) 5 MG tablet Take 1 tablet (5 mg total) by mouth 3 (three) times daily as needed for muscle spasm 60 tablet 11   estradiol (ESTRACE VAGINAL) 0.1 MG/GM vaginal cream Apply to vagina at bedtime three times a week. 42.5 g 12   fluocinonide cream (LIDEX) 0.05 % Apply topically to affected areas on body 2 (two) times daily for 1-2 weeks as needed for flares of rash 60 g 1   fluticasone (FLONASE) 50 MCG/ACT nasal spray Place 2 sprays into both nostrils daily.     icosapent Ethyl (VASCEPA) 1 g capsule Take 1 capsule (1 g total) by mouth  2 (two) times daily.     levonorgestrel (MIRENA) 20 MCG/24HR IUD 1 each by Intrauterine route once.     Multiple Vitamin (MULTIVITAMIN WITH MINERALS) TABS Take 1 tablet by mouth daily.     omeprazole (PRILOSEC) 40 MG capsule Take 1 capsule (40 mg total) by mouth daily. 30 capsule 1   oxyCODONE-acetaminophen (PERCOCET/ROXICET) 5-325 MG tablet Take 1 tablet by mouth every 8 (eight) hours as needed for pain, 28 day supply (DNF 07/28/2023) 84 tablet 0   phentermine (ADIPEX-P) 37.5 MG tablet Take 1 tablet (37.5 mg total) by mouth daily before breakfast. 30 tablet 2   pregabalin (LYRICA) 100 MG capsule Take 1 capsule (100 mg total) by mouth 2 (two) times daily. 180 capsule 1   topiramate (TOPAMAX) 50 MG tablet Take 1 tablet (50 mg total) by mouth 2 (two) times daily. 180 tablet 1   triamcinolone cream (KENALOG) 0.1 % Apply 1 Application topically 2 (two) times daily. 45 g  0   valACYclovir (VALTREX) 1000 MG tablet Take 1 tablet (1,000 mg total) by mouth daily. 90 tablet 3   Evolocumab (REPATHA SURECLICK) 140 MG/ML SOAJ Inject 140 mg into the skin every 14 (fourteen) days. (Patient not taking: Reported on 08/18/2023) 6 mL 3   ondansetron (ZOFRAN-ODT) 4 MG disintegrating tablet Dissolve 1 tablet (4 mg total) by mouth every 8 (eight) hours as needed for nausea or vomiting. (Patient not taking: Reported on 08/18/2023) 20 tablet 0   No current facility-administered medications on file prior to visit.   Past Surgical History:  Procedure Laterality Date   ANTERIOR CERVICAL DECOMP/DISCECTOMY FUSION  12/ 2020   @[email or web address]   C4 - 6   CESAREAN SECTION     X3   LAST ONE 11-30-2004   CYSTOSCOPY W/ RETROGRADES Right 10/29/2012   Procedure: CYSTOSCOPY WITH RETROGRADE PYELOGRAM and stent placement;  Surgeon: Martina Sinner, MD;  Location: [facility];  Service: Urology;  Laterality: Right;  rt stent placement , rt retrograde and cysto    CYSTOSCOPY W/ URETERAL STENT PLACEMENT Right 11/23/2012   Procedure: CYSTOSCOPY WITH STENT REPLACEMENT;  Surgeon: Sebastian Ache, MD;  Location: [facility];  Service: Urology;  Laterality: Right;   CYSTOSCOPY/RETROGRADE/URETEROSCOPY/STONE EXTRACTION WITH BASKET Right 11/23/2012   Procedure: CYSTOSCOPY/RETROGRADE/URETEROSCOPY/STONE EXTRACTION WITH BASKET;  Surgeon: Sebastian Ache, MD;  Location: [facility];  Service: Urology;  Laterality: Right;   EVALUATION UNDER ANESTHESIA WITH ANAL FISSUROTOMY N/A 01/23/2020   Procedure: ANAL EXAM UNDER ANESTHESIA, FISTULOTOMY, SKIN TAG EXCISION;  Surgeon: Romie Levee, MD;  Location: [facility];  Service: General;  Laterality: N/A;   INTRAUTERINE DEVICE (IUD) INSERTION     mirena inserted 07-11-18   SKIN CANCER DESTRUCTION       The following portions of the patient's history were reviewed and updated as appropriate: allergies, current  medications, past family history, past medical history, past social history, past surgical history and problem list.  ROS Otherwise as in subjective above  Objective: BP 124/80   Pulse 63   General appearance: alert, no distress, well developed, well nourished Neck nontender but definitely pain with range of motion which is somewhat reduced in general no mass or lymphadenopathy, no midline tenderness in the posterior Tender paraspinal upper back and supraspinatus region particular on the left No midline spine tenderness except for C7 prominence Right shoulder and arm nontender to palpation, she does have somewhat decreased left sided internal and external range of motion today but she notes that she  normally has full range of motion on that side.  Negative on special test for rotator cuff, no laxity, no swelling, but she does seem to be guarded with the left shoulder little bit Arms neurovascularly intact   Assessment: Encounter Diagnoses  Name Primary?   Chronic neck pain Yes   Acute pain of left shoulder    Inflammation of joint of left shoulder region    Muscle spasm      Plan: Symptoms and exam suggest tendinitis and inflammation of the shoulder girdle and upper back likely from the increased lifting and carrying activity over the past week.  Advise relative rest, arm sling over the weekend, supporting the arm on pillows when sitting, cold therapy with ice pack.  Stop ibuprofen begin steroid at her request to help with the flareup of her neck and shoulder girdle  No lifting or activity with the left arm this weekend  Continue your other medicines as usual per pain management  If not much improved within the next 3 to 4 days or if other new symptoms then call or recheck  [place] was seen today for shoulder pain.  Diagnoses and all orders for this visit:  Chronic neck pain  Acute pain of left shoulder  Inflammation of joint of left shoulder region  Muscle  spasm  Other orders -     predniSONE (DELTASONE) 10 MG tablet; Take 6 tablets all together (60 mg total) by mouth daily for 1 day, THEN 5 tablets (50 mg total) daily for 1 day, THEN 4 tablets (40 mg total) daily for 1 day, THEN 3 tablets (30 mg total) daily for 1 day, THEN 2 tablets (20 mg total) daily for 1 day, THEN 1 tablet (10 mg total) daily for 1 day.    Follow up: prn

## 2023-08-19 ENCOUNTER — Other Ambulatory Visit: Payer: Self-pay

## 2023-08-19 ENCOUNTER — Emergency Department (HOSPITAL_BASED_OUTPATIENT_CLINIC_OR_DEPARTMENT_OTHER)
Admission: EM | Admit: 2023-08-19 | Discharge: 2023-08-19 | Disposition: A | Discharge status: Home / Self Care | Payer: Commercial Managed Care - PPO | Attending: Emergency Medicine | Admitting: Emergency Medicine

## 2023-08-19 ENCOUNTER — Emergency Department (HOSPITAL_BASED_OUTPATIENT_CLINIC_OR_DEPARTMENT_OTHER): Payer: Commercial Managed Care - PPO

## 2023-08-19 ENCOUNTER — Encounter (HOSPITAL_BASED_OUTPATIENT_CLINIC_OR_DEPARTMENT_OTHER): Payer: Self-pay

## 2023-08-19 DIAGNOSIS — R531 Weakness: Secondary | ICD-10-CM | POA: Insufficient documentation

## 2023-08-19 DIAGNOSIS — Z7982 Long term (current) use of aspirin: Secondary | ICD-10-CM | POA: Diagnosis not present

## 2023-08-19 DIAGNOSIS — M79602 Pain in left arm: Secondary | ICD-10-CM | POA: Diagnosis not present

## 2023-08-19 DIAGNOSIS — R000 Tachycardia, unspecified: Secondary | ICD-10-CM | POA: Diagnosis not present

## 2023-08-19 DIAGNOSIS — M25512 Pain in left shoulder: Secondary | ICD-10-CM | POA: Diagnosis not present

## 2023-08-19 DIAGNOSIS — M5412 Radiculopathy, cervical region: Secondary | ICD-10-CM | POA: Diagnosis not present

## 2023-08-19 MED ORDER — HYDROMORPHONE HCL 1 MG/ML IJ SOLN
2.0000 mg | Freq: Once | INTRAMUSCULAR | Status: AC
  Administered 2023-08-19: 2 mg via INTRAMUSCULAR
  Filled 2023-08-19: qty 2

## 2023-08-19 MED ORDER — CYCLOBENZAPRINE HCL 10 MG PO TABS: 10.0000 mg | ORAL_TABLET | Freq: Three times a day (TID) | ORAL | 0 refills | Status: DC

## 2023-08-19 NOTE — ED Notes (Signed)
Patient transported to X-ray 

## 2023-08-19 NOTE — ED Notes (Signed)
Pts O2 sats decreasing when pt goes to sleep after administration of dilaudid. O2 at [facility] initiated EDP aware

## 2023-08-19 NOTE — ED Provider Notes (Addendum)
[place] [facility] Provider Note   CSN: [id number] Arrival date & time: 08/19/23  1354     History  Chief Complaint  Patient presents with   Arm Pain    Kelly [name] is a 57 y.o. female.  Patient seen by primary care provider yesterday for acute left shoulder pain.  Patient known to have chronic neck pain.  Patient in the past appears to have been seen by [facility] in [place] last seen by them October 2024.  Patient's had hardware placed in her neck as well.  Patient states that starting around Christmas suddenly got a lot of pain in the left upper shoulder area and also some in the left lateral neck area.  Some of that radiates back towards the scapula area.  Primary care doctor yesterday put her on a steroid Dosepak.  Patient is chronically on pain medicine and has active prescriptions.  Patient states that her entire left arm is completely numb.  Not just a few of her fingers but the whole arm from the shoulder on down.  Patient is able to move fingers.  Patient not able to put her hand on her head.  Which raises some question maybe about a truly be more rotator cuff as a source of pain.  That does not explain the numbness.  They also treated her with a sling as well.  Patient without any other neurological symptoms.       Home Medications Prior to Admission medications   Medication Sig Start Date End Date Taking? Authorizing Provider  acyclovir ointment (ZOVIRAX) 5 % Apply 1 Application topically every 3 (three) hour for 5-7 days 12/09/22   Chrzanowski, Jami B, NP  amLODipine (NORVASC) 2.5 MG tablet Take 1 tablet (2.5 mg total) by mouth daily. 07/05/23   Sande Rives, MD  aspirin EC 81 MG tablet Take 1 tablet (81 mg total) by mouth daily. Swallow whole. 07/09/20   O'NealRonnald Ramp, MD  atorvastatin (LIPITOR) 10 MG tablet Take 1 tablet (10 mg total) by mouth daily. 08/10/23 11/09/23  Sande Rives, MD  botulinum toxin Type A (BOTOX) 200 units injection Provider to inject 155 units into the muscles of the head and neck every 12 weeks. Discard remainder. 10/17/22   Anson Fret, MD  cyclobenzaprine (FLEXERIL) 5 MG tablet Take 1 tablet (5 mg total) by mouth 3 (three) times daily as needed for muscle spasm 11/14/22     estradiol (ESTRACE VAGINAL) 0.1 MG/GM vaginal cream Apply to vagina at bedtime three times a week. 05/08/23   Earley Favor, MD  Evolocumab (REPATHA SURECLICK) 140 MG/ML SOAJ Inject 140 mg into the skin every 14 (fourteen) days. Patient not taking: Reported on 08/18/2023 08/14/23   Sande Rives, MD  fluocinonide cream (LIDEX) 0.05 % Apply topically to affected areas on body 2 (two) times daily for 1-2 weeks as needed for flares of rash 03/06/23     fluticasone (FLONASE) 50 MCG/ACT nasal spray Place 2 sprays into both nostrils daily.    [provider]  icosapent Ethyl (VASCEPA) 1 g capsule Take 1 capsule (1 g total) by mouth 2 (two) times daily. 08/10/23   O'NealRonnald Ramp, MD  levonorgestrel (MIRENA) 20 MCG/24HR IUD 1 each by Intrauterine route once.    [provider]  Multiple Vitamin (MULTIVITAMIN WITH MINERALS) TABS Take 1 tablet by mouth daily.    [provider]  omeprazole (PRILOSEC) 40 MG  capsule Take 1 capsule (40 mg total) by mouth daily. 02/13/23   Eden Emms, NP  ondansetron (ZOFRAN-ODT) 4 MG disintegrating tablet Dissolve 1 tablet (4 mg total) by mouth every 8 (eight) hours as needed for nausea or vomiting. Patient not taking: Reported on 08/18/2023 06/23/23   Tysinger, Kermit Balo, PA-C  oxyCODONE-acetaminophen (PERCOCET/ROXICET) 5-325 MG tablet Take 1 tablet by mouth every 8 (eight) hours as needed for pain, 28 day supply (DNF 07/28/2023) 07/28/23     phentermine (ADIPEX-P) 37.5 MG tablet Take 1 tablet (37.5 mg total) by mouth daily before breakfast. 11/09/22   Genia Del, MD  predniSONE (DELTASONE) 10 MG  tablet Take 6 tablets all together (60 mg total) by mouth daily for 1 day, THEN 5 tablets (50 mg total) daily for 1 day, THEN 4 tablets (40 mg total) daily for 1 day, THEN 3 tablets (30 mg total) daily for 1 day, THEN 2 tablets (20 mg total) daily for 1 day, THEN 1 tablet (10 mg total) daily for 1 day. 08/18/23 08/24/23  Tysinger, Kermit Balo, PA-C  pregabalin (LYRICA) 100 MG capsule Take 1 capsule (100 mg total) by mouth 2 (two) times daily. 09/07/22     topiramate (TOPAMAX) 50 MG tablet Take 1 tablet (50 mg total) by mouth 2 (two) times daily. 05/30/23   Anson Fret, MD  triamcinolone cream (KENALOG) 0.1 % Apply 1 Application topically 2 (two) times daily. 12/16/22   Tysinger, Kermit Balo, PA-C  valACYclovir (VALTREX) 1000 MG tablet Take 1 tablet (1,000 mg total) by mouth daily. 11/14/22   Genia Del, MD      Allergies    Imitrex [sumatriptan base], Zomig, Avelox [moxifloxacin hcl in nacl], Morphine, Septra [bactrim], Sulfamethoxazole, Sumatriptan, and Trimethoprim    Review of Systems   Review of Systems  Constitutional:  Negative for chills and fever.  HENT:  Negative for ear pain and sore throat.   Eyes:  Negative for pain and visual disturbance.  Respiratory:  Negative for cough and shortness of breath.   Cardiovascular:  Negative for chest pain and palpitations.  Gastrointestinal:  Negative for abdominal pain and vomiting.  Genitourinary:  Negative for dysuria and hematuria.  Musculoskeletal:  Positive for neck pain. Negative for arthralgias and back pain.  Skin:  Negative for color change and rash.  Neurological:  Positive for weakness and numbness. Negative for seizures, syncope and speech difficulty.  All other systems reviewed and are negative.   Physical Exam Updated Vital Signs BP 122/79   Pulse 80   Temp 97.6 F (36.4 C) (Oral)   Resp 20   Ht 1.626 m (5\' 4" )   Wt 72 kg   SpO2 100%   BMI 27.25 kg/m  Physical Exam Vitals and nursing note reviewed.  Constitutional:       General: She is not in acute distress.    Appearance: Normal appearance. She is well-developed.  HENT:     Head: Normocephalic and atraumatic.     Mouth/Throat:     Mouth: Mucous membranes are moist.  Eyes:     Conjunctiva/sclera: Conjunctivae normal.  Cardiovascular:     Rate and Rhythm: Normal rate and regular rhythm.     Heart sounds: No murmur heard. Pulmonary:     Effort: Pulmonary effort is normal. No respiratory distress.     Breath sounds: Normal breath sounds.  Abdominal:     Palpations: Abdomen is soft.     Tenderness: There is no abdominal tenderness.  Musculoskeletal:  General: No swelling.     Cervical back: Neck supple. No rigidity.  Skin:    General: Skin is warm and dry.     Capillary Refill: Capillary refill takes less than 2 seconds.  Neurological:     Mental Status: She is alert and oriented to person, place, and time.     Cranial Nerves: No cranial nerve deficit.     Sensory: Sensory deficit present.     Motor: Weakness present.  Psychiatric:        Mood and Affect: Mood normal.     ED Results / Procedures / Treatments   Labs (all labs ordered are listed, but only abnormal results are displayed) Labs Reviewed - No data to display  EKG EKG Interpretation Date/Time:  Saturday August 19 2023 14:04:36 EST Ventricular Rate:  102 PR Interval:  151 QRS Duration:  95 QT Interval:  411 QTC Calculation: 536 R Axis:   64  Text Interpretation: Sinus tachycardia Borderline repolarization abnormality Prolonged QT interval No significant change since last tracing Confirmed by Vanetta Mulders [phone]) on 08/19/2023 4:29:07 PM  Radiology DG Shoulder Left Result Date: 08/19/2023 CLINICAL DATA:  Acute onset of left shoulder pain. EXAM: LEFT SHOULDER - 2+ VIEW COMPARISON:  None Available. FINDINGS: There is no evidence of fracture or dislocation. The alignment and joint spaces are preserved. There is no evidence of arthropathy or other focal bone  abnormality. Soft tissues are unremarkable. Lower cervical hardware is partially included in the field of view. IMPRESSION: Negative radiographs of the left shoulder. Electronically Signed   By: Narda Rutherford M.D.   On: 08/19/2023 18:25    Procedures Procedures    Medications Ordered in ED Medications  HYDROmorphone (DILAUDID) injection 2 mg (2 mg Intramuscular Given 08/19/23 1735)    ED Course/ Medical Decision Making/ A&P                                 Medical Decision Making Amount and/or Complexity of Data Reviewed Radiology: ordered.  Risk Prescription drug management.   Patient treated here with some IM hydromorphone.  Which actually put her somewhat to sleep.  Probably with her baseline pain medicines that she normally takes this was a bit too much.  X-ray of the left shoulder was done just to rule out any bony abnormality or to make sure no tumors.  And it was negative.  Clinically suspect that this is cervical radiculopathy type symptoms may be could be brachial plexus symptoms.  Will recommend patient follow back up with her neurosurgeon.  And complete the Dosepak.  Will give her prescription for Flexeril.  Limited on giving her any additional pain medicine because she is got active prescriptions.   Final Clinical Impression(s) / ED Diagnoses Final diagnoses:  Left arm pain  Cervical radiculopathy    Rx / DC Orders ED Discharge Orders     None         Vanetta Mulders, MD 08/19/23 Andres Labrum    Vanetta Mulders, MD 08/19/23 1900
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
## 2023-08-19 NOTE — ED Notes (Signed)
Pt. Given 2mg  dilaudid at 1735.  Started on 2L O2 for decreased sats into the 80s after receiving this medication.

## 2023-08-19 NOTE — ED Notes (Signed)
 Discharge paperwork reviewed entirely with patient, including follow up care. Pain was under control. The patient received instruction and coaching on their prescriptions, and all follow-up questions were answered.  Pt verbalized understanding as well as all parties involved. No questions or concerns voiced at the time of discharge. No acute distress noted.   Pt ambulated out to PVA without incident or assistance.  Pt advised they will seek followup care with a specialist and followup with their PCP.

## 2023-08-19 NOTE — Discharge Instructions (Signed)
Take your pain medications as directed.  Also take the Flexeril as needed.  Continue take your prednisone.  Follow-up with your neurosurgeon.

## 2023-08-20 ENCOUNTER — Other Ambulatory Visit: Payer: Self-pay | Admitting: Medical

## 2023-08-20 MED ORDER — HYDROMORPHONE HCL 4 MG PO TABS: 4.0000 mg | ORAL_TABLET | Freq: Two times a day (BID) | ORAL | 0 refills | Status: DC | PRN

## 2023-08-21 ENCOUNTER — Other Ambulatory Visit: Payer: Self-pay | Admitting: Medical

## 2023-08-21 ENCOUNTER — Other Ambulatory Visit (HOSPITAL_COMMUNITY): Payer: Self-pay

## 2023-08-21 ENCOUNTER — Telehealth: Payer: Self-pay | Admitting: Internal Medicine

## 2023-08-21 ENCOUNTER — Ambulatory Visit
Admission: RE | Admit: 2023-08-21 | Discharge: 2023-08-21 | Disposition: A | Discharge status: Home / Self Care | Payer: Commercial Managed Care - PPO | Source: Ambulatory Visit | Attending: Medical | Admitting: Medical

## 2023-08-21 DIAGNOSIS — R200 Anesthesia of skin: Secondary | ICD-10-CM

## 2023-08-21 DIAGNOSIS — Z981 Arthrodesis status: Secondary | ICD-10-CM

## 2023-08-21 DIAGNOSIS — M792 Neuralgia and neuritis, unspecified: Secondary | ICD-10-CM

## 2023-08-21 DIAGNOSIS — M542 Cervicalgia: Secondary | ICD-10-CM

## 2023-08-21 DIAGNOSIS — M5412 Radiculopathy, cervical region: Secondary | ICD-10-CM | POA: Diagnosis not present

## 2023-08-21 MED ORDER — OXYCODONE-ACETAMINOPHEN 5-325 MG PO TABS
1.0000 | ORAL_TABLET | Freq: Three times a day (TID) | ORAL | 0 refills | Status: DC | PRN
  Filled 2023-08-25: qty 84, 28d supply, fill #0

## 2023-08-21 NOTE — Telephone Encounter (Signed)
I have caryn working on this

## 2023-08-21 NOTE — Telephone Encounter (Signed)
Pt called and states that she is in severe pain. She has left arm numbness. She is in extreme pain at neck, shoulder and left arm. She can not get in touch with her neurosurgeron and wants to get the STAT MRI today. She is having spasms that her pain level is a 10

## 2023-08-21 NOTE — Telephone Encounter (Signed)
[facility] will contact pt to schedule

## 2023-08-22 NOTE — Progress Notes (Signed)
Results sent through MyChart

## 2023-08-23 DIAGNOSIS — C4491 Basal cell carcinoma of skin, unspecified: Secondary | ICD-10-CM

## 2023-08-23 HISTORY — DX: Basal cell carcinoma of skin, unspecified: C44.91

## 2023-08-23 HISTORY — PX: MOHS SURGERY: SUR867

## 2023-08-24 ENCOUNTER — Other Ambulatory Visit (HOSPITAL_COMMUNITY): Payer: Self-pay

## 2023-08-25 ENCOUNTER — Other Ambulatory Visit (HOSPITAL_COMMUNITY): Payer: Self-pay

## 2023-08-30 ENCOUNTER — Other Ambulatory Visit (HOSPITAL_COMMUNITY): Payer: Self-pay

## 2023-08-30 DIAGNOSIS — F1120 Opioid dependence, uncomplicated: Secondary | ICD-10-CM | POA: Diagnosis not present

## 2023-08-30 DIAGNOSIS — M5412 Radiculopathy, cervical region: Secondary | ICD-10-CM | POA: Diagnosis not present

## 2023-08-30 DIAGNOSIS — Q761 Klippel-Feil syndrome: Secondary | ICD-10-CM | POA: Diagnosis not present

## 2023-08-30 MED ORDER — OXYCODONE-ACETAMINOPHEN 5-325 MG PO TABS
1.0000 | ORAL_TABLET | Freq: Three times a day (TID) | ORAL | 0 refills | Status: DC | PRN
  Filled 2023-09-22: qty 84, 28d supply, fill #0

## 2023-08-30 MED ORDER — PREGABALIN 100 MG PO CAPS
100.0000 mg | ORAL_CAPSULE | Freq: Two times a day (BID) | ORAL | 1 refills | Status: DC
  Filled 2023-08-30: qty 60, 30d supply, fill #0
  Filled 2023-09-28: qty 60, 30d supply, fill #1

## 2023-08-30 MED ORDER — OXYCODONE-ACETAMINOPHEN 5-325 MG PO TABS: 1.0000 | ORAL_TABLET | Freq: Three times a day (TID) | ORAL | 0 refills | Status: DC | PRN

## 2023-08-30 MED ORDER — CYCLOBENZAPRINE HCL 5 MG PO TABS
5.0000 mg | ORAL_TABLET | Freq: Three times a day (TID) | ORAL | 11 refills | Status: DC | PRN
  Filled 2023-08-30: qty 60, 20d supply, fill #0

## 2023-08-30 MED ORDER — OXYCODONE-ACETAMINOPHEN 5-325 MG PO TABS
1.0000 | ORAL_TABLET | Freq: Three times a day (TID) | ORAL | 0 refills | Status: DC | PRN
  Filled 2023-10-20: qty 84, 28d supply, fill #0

## 2023-08-31 DIAGNOSIS — Z6828 Body mass index (BMI) 28.0-28.9, adult: Secondary | ICD-10-CM | POA: Diagnosis not present

## 2023-08-31 DIAGNOSIS — M5412 Radiculopathy, cervical region: Secondary | ICD-10-CM | POA: Diagnosis not present

## 2023-09-08 ENCOUNTER — Other Ambulatory Visit (HOSPITAL_COMMUNITY): Payer: Self-pay

## 2023-09-18 DIAGNOSIS — M5412 Radiculopathy, cervical region: Secondary | ICD-10-CM | POA: Diagnosis not present

## 2023-09-21 ENCOUNTER — Other Ambulatory Visit (HOSPITAL_COMMUNITY): Payer: Self-pay

## 2023-09-22 ENCOUNTER — Other Ambulatory Visit: Payer: Self-pay

## 2023-09-22 ENCOUNTER — Other Ambulatory Visit (HOSPITAL_COMMUNITY): Payer: Self-pay

## 2023-09-25 ENCOUNTER — Other Ambulatory Visit (HOSPITAL_COMMUNITY): Payer: Self-pay

## 2023-09-28 ENCOUNTER — Other Ambulatory Visit: Payer: Self-pay

## 2023-09-28 ENCOUNTER — Other Ambulatory Visit (HOSPITAL_COMMUNITY): Payer: Self-pay

## 2023-09-28 ENCOUNTER — Encounter: Payer: Self-pay | Admitting: Neurology

## 2023-09-28 MED ORDER — PREGABALIN 100 MG PO CAPS
100.0000 mg | ORAL_CAPSULE | Freq: Two times a day (BID) | ORAL | 1 refills | Status: DC
  Filled 2023-09-28: qty 180, 90d supply, fill #0
  Filled 2023-11-27: qty 60, 30d supply, fill #0
  Filled 2023-12-20 – 2024-01-23 (×2): qty 60, 30d supply, fill #1
  Filled 2024-03-22 – 2024-06-27 (×3): qty 60, 30d supply, fill #2
  Filled 2024-07-29: qty 60, 30d supply, fill #3
  Filled 2024-08-19 – 2024-08-28 (×2): qty 60, 30d supply, fill #4

## 2023-09-28 MED ORDER — UBRELVY 100 MG PO TABS
1.0000 | ORAL_TABLET | Freq: Every day | ORAL | 3 refills | Status: DC | PRN
  Filled 2023-09-28: qty 16, 30d supply, fill #0
  Filled 2023-10-23: qty 16, 30d supply, fill #1
  Filled 2023-10-27: qty 8, 30d supply, fill #0
  Filled 2023-10-27 – 2023-11-13 (×4): qty 16, 30d supply, fill #0
  Filled 2023-12-06 – 2023-12-07 (×2): qty 16, 30d supply, fill #1
  Filled 2024-01-12: qty 16, 30d supply, fill #2

## 2023-10-02 ENCOUNTER — Other Ambulatory Visit (HOSPITAL_COMMUNITY): Payer: Self-pay

## 2023-10-05 ENCOUNTER — Other Ambulatory Visit: Payer: Self-pay

## 2023-10-17 ENCOUNTER — Other Ambulatory Visit (HOSPITAL_COMMUNITY): Payer: Self-pay

## 2023-10-20 ENCOUNTER — Other Ambulatory Visit (HOSPITAL_COMMUNITY): Payer: Self-pay

## 2023-10-20 ENCOUNTER — Other Ambulatory Visit: Payer: Self-pay

## 2023-10-24 ENCOUNTER — Other Ambulatory Visit: Payer: Self-pay | Admitting: Neurology

## 2023-10-24 ENCOUNTER — Other Ambulatory Visit (HOSPITAL_COMMUNITY): Payer: Self-pay

## 2023-10-24 ENCOUNTER — Other Ambulatory Visit: Payer: Self-pay | Admitting: Pharmacy Technician

## 2023-10-24 ENCOUNTER — Other Ambulatory Visit: Payer: Self-pay

## 2023-10-24 DIAGNOSIS — G43709 Chronic migraine without aura, not intractable, without status migrainosus: Secondary | ICD-10-CM

## 2023-10-24 NOTE — Progress Notes (Signed)
 Specialty Pharmacy Refill Coordination Note  Kelly [name] is a 58 y.o. female contacted today regarding refills of specialty medication(s) Onabotulinumtoxina   Patient requested (Patient-Rptd) Delivery   Delivery date: (Patient-Rptd) 10/27/23   Verified address: (Patient-Rptd) I have never gotten my botox it's always at [facility], has this changed?  [facility] [address] [place]. [address]  Medication will be filled on 10/26/23.   RR sent to MD; Courier as soon as rx is approved Appt 11/02/23

## 2023-10-25 ENCOUNTER — Other Ambulatory Visit: Payer: Self-pay

## 2023-10-25 ENCOUNTER — Other Ambulatory Visit (HOSPITAL_COMMUNITY): Payer: Self-pay

## 2023-10-25 MED ORDER — BOTOX 200 UNITS IJ SOLR
INTRAMUSCULAR | 3 refills | Status: AC
Start: 2023-10-25 — End: ?
  Filled 2023-10-25: qty 1, 84d supply, fill #0
  Filled 2024-01-17: qty 1, 84d supply, fill #1
  Filled 2024-04-12: qty 1, 84d supply, fill #2
  Filled 2024-07-15: qty 1, 84d supply, fill #3

## 2023-10-27 ENCOUNTER — Other Ambulatory Visit (HOSPITAL_COMMUNITY): Payer: Self-pay

## 2023-10-27 MED ORDER — FLUOROURACIL 5 % EX CREA
1.0000 | TOPICAL_CREAM | Freq: Two times a day (BID) | CUTANEOUS | 0 refills | Status: AC
  Filled 2023-10-27: qty 40, 21d supply, fill #0

## 2023-10-31 ENCOUNTER — Other Ambulatory Visit (HOSPITAL_COMMUNITY): Payer: Self-pay

## 2023-11-02 ENCOUNTER — Ambulatory Visit: Payer: Commercial Managed Care - PPO | Admitting: Neurology

## 2023-11-02 DIAGNOSIS — G43709 Chronic migraine without aura, not intractable, without status migrainosus: Secondary | ICD-10-CM | POA: Diagnosis not present

## 2023-11-02 MED ORDER — ONABOTULINUMTOXINA 200 UNITS IJ SOLR
155.0000 [IU] | Freq: Once | INTRAMUSCULAR | Status: AC
  Administered 2023-11-02: 155 [IU] via INTRAMUSCULAR

## 2023-11-02 NOTE — Progress Notes (Signed)
 Botox- 200 units x 1 vial Lot: [id number] Expiration: 11/2025 NDC: 5784-6962-95   Bacteriostatic 0.9% Sodium Chloride- 4 mL  Lot: [id number] Expiration: 06/22/2024 NDC: 4401-0272-53   Dx: G64.403 S/P Witnessed by Deno Lunger. CMA

## 2023-11-02 NOTE — Progress Notes (Signed)
 Consent Form Botulism Toxin Injection For Chronic Migraine  11/02/2023: minimum > 60% - 70 improvement in migraine and headache frequency  08/13/2023: Stable, still doing excellent!   04/19/2023: doing excellent, stable or better  01/24/2023: doong great, daughter graduated HS and will see me for migraines. minimum > 60% - 70 improvement in migraine and headache frequency  08/08/2022: stable doing well, minimum > 60% - 70 improvement in migraine and headache frequency  05/12/2022: She will get migrianes 2 weeks after botox when the botox has worn off. This has happened a few times, takes nurtec and then kicks in. after that she does great no more than 4 migraine days a month and no headache days. At baseline she had daily headaches prior to botox.   02/17/2022: >>90% improvement in migraine frequency.  Reviewed orally with patient, additionally signature is on file:  Botulism toxin has been approved by the Federal drug administration for treatment of chronic migraine. Botulism toxin does not cure chronic migraine and it may not be effective in some patients.  The administration of botulism toxin is accomplished by injecting a small amount of toxin into the muscles of the neck and head. Dosage must be titrated for each individual. Any benefits resulting from botulism toxin tend to wear off after 3 months with a repeat injection required if benefit is to be maintained. Injections are usually done every 3-4 months with maximum effect peak achieved by about 2 or 3 weeks. Botulism toxin is expensive and you should be sure of what costs you will incur resulting from the injection.  The side effects of botulism toxin use for chronic migraine may include:   -Transient, and usually mild, facial weakness with facial injections  -Transient, and usually mild, head or neck weakness with head/neck injections  -Reduction or loss of forehead facial animation due to forehead muscle weakness  -Eyelid  drooping  -Dry eye  -Pain at the site of injection or bruising at the site of injection  -Double vision  -Potential unknown long term risks  Contraindications: You should not have Botox if you are pregnant, nursing, allergic to albumin, have an infection, skin condition, or muscle weakness at the site of the injection, or have myasthenia gravis, Lambert-Eaton syndrome, or ALS.  It is also possible that as with any injection, there may be an allergic reaction or no effect from the medication. Reduced effectiveness after repeated injections is sometimes seen and rarely infection at the injection site may occur. All care will be taken to prevent these side effects. If therapy is given over a long time, atrophy and wasting in the muscle injected may occur. Occasionally the patient's become refractory to treatment because they develop antibodies to the toxin. In this event, therapy needs to be modified.  I have read the above information and consent to the administration of botulism toxin.    BOTOX PROCEDURE NOTE FOR MIGRAINE HEADACHE    Contraindications and precautions discussed with patient(above). Aseptic procedure was observed and patient tolerated procedure. Procedure performed by Dr. Artemio Aly  The condition has existed for more than 6 months, and pt does not have a diagnosis of ALS, Myasthenia Gravis or Lambert-Eaton Syndrome.  Risks and benefits of injections discussed and pt agrees to proceed with the procedure.  Written consent obtained  These injections are medically necessary. Pt  receives good benefits from these injections. These injections do not cause sedations or hallucinations which the oral therapies may cause.  Description of procedure:  The patient was  placed in a sitting position. The standard protocol was used for Botox as follows, with 5 units of Botox injected at each site:   -Procerus muscle, midline injection  -Corrugator muscle, bilateral  injection  -Frontalis muscle, bilateral injection, with 2 sites each side, medial injection was performed in the upper one third of the frontalis muscle, in the region vertical from the medial inferior edge of the superior orbital rim. The lateral injection was again in the upper one third of the forehead vertically above the lateral limbus of the cornea, 1.5 cm lateral to the medial injection site.  -Temporalis muscle injection, 4 sites, bilaterally. The first injection was 3 cm above the tragus of the ear, second injection site was 1.5 cm to 3 cm up from the first injection site in line with the tragus of the ear. The third injection site was 1.5-3 cm forward between the first 2 injection sites. The fourth injection site was 1.5 cm posterior to the second injection site.   -Occipitalis muscle injection, 3 sites, bilaterally. The first injection was done one half way between the occipital protuberance and the tip of the mastoid process behind the ear. The second injection site was done lateral and superior to the first, 1 fingerbreadth from the first injection. The third injection site was 1 fingerbreadth superiorly and medially from the first injection site.  -Cervical paraspinal muscle injection, 2 sites, bilateral knee first injection site was 1 cm from the midline of the cervical spine, 3 cm inferior to the lower border of the occipital protuberance. The second injection site was 1.5 cm superiorly and laterally to the first injection site.  -Trapezius muscle injection was performed at 3 sites, bilaterally. The first injection site was in the upper trapezius muscle halfway between the inflection point of the neck, and the acromion. The second injection site was one half way between the acromion and the first injection site. The third injection was done between the first injection site and the inflection point of the neck.   Will return for repeat injection in 3 months.   155 units of Botox was  used, 45u Botox not injected was wasted. The patient tolerated the procedure well, there were no complications of the above procedure.

## 2023-11-03 ENCOUNTER — Telehealth: Payer: Self-pay | Admitting: Pharmacy Technician

## 2023-11-03 ENCOUNTER — Other Ambulatory Visit (HOSPITAL_COMMUNITY): Payer: Self-pay

## 2023-11-03 NOTE — Telephone Encounter (Signed)
 Pharmacy Patient Advocate Encounter   Received notification from CoverMyMeds that prior authorization for Ubrelvy 100MG  tablets is required/requested.   Insurance verification completed.   The patient is insured through [facility] .   Per test claim: PA required; PA submitted to above mentioned insurance via CoverMyMeds Key/confirmation #/EOC [id number] Status is pending

## 2023-11-06 ENCOUNTER — Encounter: Payer: Self-pay | Admitting: Neurology

## 2023-11-06 ENCOUNTER — Other Ambulatory Visit (HOSPITAL_COMMUNITY): Payer: Self-pay

## 2023-11-06 DIAGNOSIS — F411 Generalized anxiety disorder: Secondary | ICD-10-CM | POA: Diagnosis not present

## 2023-11-06 NOTE — Telephone Encounter (Signed)
 Let pt know as soon as we get determination.

## 2023-11-09 ENCOUNTER — Telehealth: Admitting: Nurse Practitioner

## 2023-11-09 ENCOUNTER — Other Ambulatory Visit (HOSPITAL_COMMUNITY): Payer: Self-pay

## 2023-11-09 ENCOUNTER — Encounter: Payer: Self-pay | Admitting: Family Medicine

## 2023-11-09 VITALS — Temp 101.1°F | Ht 64.0 in | Wt 160.0 lb

## 2023-11-09 DIAGNOSIS — J069 Acute upper respiratory infection, unspecified: Secondary | ICD-10-CM | POA: Diagnosis not present

## 2023-11-09 MED ORDER — HYDROCOD POLI-CHLORPHE POLI ER 10-8 MG/5ML PO SUER
5.0000 mL | Freq: Two times a day (BID) | ORAL | 0 refills | Status: DC
  Filled 2023-11-09: qty 115, 12d supply, fill #0

## 2023-11-09 MED ORDER — IBUPROFEN 800 MG PO TABS
800.0000 mg | ORAL_TABLET | Freq: Three times a day (TID) | ORAL | 1 refills | Status: AC | PRN
  Filled 2023-11-09: qty 60, 20d supply, fill #0
  Filled 2024-08-09: qty 60, 20d supply, fill #1

## 2023-11-09 MED ORDER — PREDNISONE 50 MG PO TABS
50.0000 mg | ORAL_TABLET | Freq: Every day | ORAL | 0 refills | Status: DC
  Filled 2023-11-09: qty 6, 6d supply, fill #0

## 2023-11-09 MED ORDER — BENZONATATE 200 MG PO CAPS
200.0000 mg | ORAL_CAPSULE | Freq: Two times a day (BID) | ORAL | 0 refills | Status: DC | PRN
Start: 2023-11-09 — End: 2024-07-09
  Filled 2023-11-09: qty 20, 10d supply, fill #0

## 2023-11-09 MED ORDER — TRELEGY ELLIPTA 200-62.5-25 MCG/ACT IN AEPB: INHALATION_SPRAY | RESPIRATORY_TRACT | Status: DC

## 2023-11-09 MED ORDER — AIRSUPRA 90-80 MCG/ACT IN AERO
2.0000 | INHALATION_SPRAY | Freq: Four times a day (QID) | RESPIRATORY_TRACT | 3 refills | Status: DC | PRN
Start: 2023-11-09 — End: 2024-07-15
  Filled 2023-11-09: qty 10.7, 25d supply, fill #0

## 2023-11-09 MED ORDER — NIRMATRELVIR/RITONAVIR (PAXLOVID)TABLET
3.0000 | ORAL_TABLET | Freq: Two times a day (BID) | ORAL | 0 refills | Status: AC
  Filled 2023-11-09: qty 30, 5d supply, fill #0

## 2023-11-09 NOTE — Progress Notes (Signed)
 Virtual Visit Encounter mychart visit.   I connected with  Kelly [name] on 11/09/23 at  4:15 PM EDT by secure video and audio telemedicine application. I verified that I am speaking with the correct person using two identifiers.   I introduced myself as a Publishing rights manager with the practice. The limitations of evaluation and management by telemedicine discussed with the patient and the availability of in person appointments. The patient expressed verbal understanding and consent to proceed.  Participating parties in this visit include: Myself and patient  The patient is: Patient Location: Home I am: Provider Location: Office/Clinic Subjective:    CC and HPI: Kelly [name] is a 58 y.o. year old female presenting for new evaluation and treatment of fevers, cough, shortness of breath, headaches, body aches, generalized malaise, chest heaviness, and shortness of breath.    Past medical history, Surgical history, Family history not pertinant except as noted below, Social history, Allergies, and medications have been entered into the medical record, reviewed, and corrections made.   Review of Systems:  All review of systems negative except what is listed in the HPI  Objective:    Alert and oriented x 4 Frequent coughing with shortness of breath present. Able to form sentences, but does require a short break after coughing.    Impression and Recommendations:    Problem List Items Addressed This Visit     Upper respiratory tract infection - Primary   Presents with symptoms indicative of an acute respiratory illness, including fever, cough, chest heaviness, body aches, and headache. Symptoms began yesterday morning, progressing to fever and severe body aches by night. Reports a negative COVID test, but symptoms are more suggestive of COVID-19 than influenza, although both are considered in the differential diagnosis. Significant discomfort and difficulty sleeping due to body aches and  cough. Cough is harsh and aggressive with need to rest from speaking for about 30 seconds after coughing fit. Discussed treatment options including Paxlovid and prednisone. Paxlovid is chosen to avoid missing the treatment window for COVID-19, with the option to switch to Tamiflu if influenza is confirmed. Prednisone and Trelegy inhaler are prescribed to reduce inflammation. Hycodan and Tessalon Perles are prescribed for cough relief and to aid sleep. Alternate tylenol and ibuprofen every 4 hours for pain and fever management. Emphasized the importance of hydration and rest. Very low threshold for sending to ED for evaluation, but would like to avoid that if at all possible. She is aware to contact the office or seek emergency care IMMEDIATELY if she has worsening difficulty breathing. Will send pulse ox to her home for monitoring. Goal O2 saturations greater than 88% while cough and thick mucous are present. If lower than that- seek emergency care.  - Prescribe Paxlovid to initiate treatment for suspected COVID-19. - Prescribe prednisone to reduce inflammation and alleviate symptoms. - Prescribe Hycodan for cough relief and to aid sleep. - Prescribe Tessalon Perles for additional cough relief. - Provide a Trelegy inhaler to reduce lung inflammation. - Prescribe Airsupra for PRN wheezing/cough treatment - Prescribe Iburprofen 800mg  and provide 1000mg  Tylenol to alternate every 8 hours for pain, fever, headache. Continue for at least 48 hours.  - Advise continuation Mucinex for at least 48 hours. OK to continue DayQuil and Nyquil as needed for symptom relief as long as the medications are NOT the same as other medications taken. - Ensure adequate hydration and rest. - Monitor symptoms and report any changes, especially if breathing worsens.  - If influenza ends  up positive, notify immediately so we can send tamiflu.      Relevant Medications   predniSONE (DELTASONE) 50 MG tablet    nirmatrelvir/ritonavir (PAXLOVID) 20 x 150 MG & 10 x 100MG  TABS   chlorpheniramine-HYDROcodone (TUSSIONEX) 10-8 MG/5ML   benzonatate (TESSALON) 200 MG capsule   Fluticasone-Umeclidin-Vilant (TRELEGY ELLIPTA) 200-62.5-25 MCG/ACT AEPB   ibuprofen (ADVIL) 800 MG tablet   Albuterol-Budesonide (AIRSUPRA) 90-80 MCG/ACT AERO    orders and follow up as documented in EMR I discussed the assessment and treatment plan with the patient. The patient was provided an opportunity to ask questions and all were answered. The patient agreed with the plan and demonstrated an understanding of the instructions.   The patient was advised to call back or seek an in-person evaluation if the symptoms worsen or if the condition fails to improve as anticipated.  Follow-Up: in a few days- call to let us know how you are doing  I provided 21 minutes of non-face-to-face interaction with this non face-to-face encounter including intake, same-day documentation, and chart review.   [name], NP , DNP, AGNP-c [facility]

## 2023-11-09 NOTE — Telephone Encounter (Signed)
 Pharmacy Patient Advocate Encounter  Received notification from [facility] that Prior Authorization for Ubrelvy 100MG  tablets has been APPROVED from 11/06/2023 to 05/04/2024. Ran test claim, Copay is $0. This test claim was processed through [facility]- copay amounts may vary at other pharmacies due to pharmacy/plan contracts, or as the patient moves through the different stages of their insurance plan.   PA #/Case ID/Reference #: PA Case ID #: [id number]

## 2023-11-09 NOTE — Assessment & Plan Note (Signed)
 Presents with symptoms indicative of an acute respiratory illness, including fever, cough, chest heaviness, body aches, and headache. Symptoms began yesterday morning, progressing to fever and severe body aches by night. Reports a negative COVID test, but symptoms are more suggestive of COVID-19 than influenza, although both are considered in the differential diagnosis. Significant discomfort and difficulty sleeping due to body aches and cough. Cough is harsh and aggressive with need to rest from speaking for about 30 seconds after coughing fit. Discussed treatment options including Paxlovid and prednisone. Paxlovid is chosen to avoid missing the treatment window for COVID-19, with the option to switch to Tamiflu if influenza is confirmed. Prednisone and Trelegy inhaler are prescribed to reduce inflammation. Hycodan and Tessalon Perles are prescribed for cough relief and to aid sleep. Alternate tylenol and ibuprofen every 4 hours for pain and fever management. Emphasized the importance of hydration and rest. Very low threshold for sending to ED for evaluation, but would like to avoid that if at all possible. She is aware to contact the office or seek emergency care IMMEDIATELY if she has worsening difficulty breathing. Will send pulse ox to her home for monitoring. Goal O2 saturations greater than 88% while cough and thick mucous are present. If lower than that- seek emergency care.  - Prescribe Paxlovid to initiate treatment for suspected COVID-19. - Prescribe prednisone to reduce inflammation and alleviate symptoms. - Prescribe Hycodan for cough relief and to aid sleep. - Prescribe Tessalon Perles for additional cough relief. - Provide a Trelegy inhaler to reduce lung inflammation. - Prescribe Airsupra for PRN wheezing/cough treatment - Prescribe Iburprofen 800mg  and provide 1000mg  Tylenol to alternate every 8 hours for pain, fever, headache. Continue for at least 48 hours.  - Advise continuation Mucinex  for at least 48 hours. OK to continue DayQuil and Nyquil as needed for symptom relief as long as the medications are NOT the same as other medications taken. - Ensure adequate hydration and rest. - Monitor symptoms and report any changes, especially if breathing worsens.  - If influenza ends up positive, notify immediately so we can send tamiflu.

## 2023-11-13 ENCOUNTER — Other Ambulatory Visit (HOSPITAL_COMMUNITY): Payer: Self-pay

## 2023-11-13 DIAGNOSIS — F411 Generalized anxiety disorder: Secondary | ICD-10-CM | POA: Diagnosis not present

## 2023-11-14 ENCOUNTER — Other Ambulatory Visit: Payer: Self-pay | Admitting: Nurse Practitioner

## 2023-11-14 ENCOUNTER — Other Ambulatory Visit (HOSPITAL_COMMUNITY): Payer: Self-pay

## 2023-11-14 DIAGNOSIS — N3090 Cystitis, unspecified without hematuria: Secondary | ICD-10-CM

## 2023-11-14 MED ORDER — NITROFURANTOIN MONOHYD MACRO 100 MG PO CAPS
ORAL_CAPSULE | ORAL | 3 refills | Status: AC
  Filled 2023-11-14: qty 40, 30d supply, fill #0
  Filled 2024-07-22: qty 40, 30d supply, fill #1
  Filled 2024-08-19: qty 40, 30d supply, fill #2

## 2023-11-15 ENCOUNTER — Other Ambulatory Visit (HOSPITAL_COMMUNITY): Payer: Self-pay

## 2023-11-16 ENCOUNTER — Other Ambulatory Visit (HOSPITAL_COMMUNITY): Payer: Self-pay

## 2023-11-16 MED ORDER — OXYCODONE-ACETAMINOPHEN 5-325 MG PO TABS
1.0000 | ORAL_TABLET | Freq: Three times a day (TID) | ORAL | 0 refills | Status: DC | PRN
  Filled 2023-11-17: qty 84, 28d supply, fill #0

## 2023-11-17 ENCOUNTER — Encounter: Payer: Self-pay | Admitting: Nurse Practitioner

## 2023-11-17 ENCOUNTER — Ambulatory Visit (INDEPENDENT_AMBULATORY_CARE_PROVIDER_SITE_OTHER): Admitting: Nurse Practitioner

## 2023-11-17 ENCOUNTER — Other Ambulatory Visit (HOSPITAL_COMMUNITY): Payer: Self-pay

## 2023-11-17 VITALS — BP 132/84 | HR 76 | Wt 160.6 lb

## 2023-11-17 DIAGNOSIS — J069 Acute upper respiratory infection, unspecified: Secondary | ICD-10-CM

## 2023-11-17 DIAGNOSIS — N390 Urinary tract infection, site not specified: Secondary | ICD-10-CM | POA: Diagnosis not present

## 2023-11-17 DIAGNOSIS — J0140 Acute pansinusitis, unspecified: Secondary | ICD-10-CM

## 2023-11-17 MED ORDER — AZITHROMYCIN 250 MG PO TABS
ORAL_TABLET | ORAL | 0 refills | Status: AC
  Filled 2023-11-17: qty 6, 5d supply, fill #0

## 2023-11-17 MED ORDER — FLUCONAZOLE 150 MG PO TABS
150.0000 mg | ORAL_TABLET | ORAL | 2 refills | Status: AC
Start: 2023-11-17 — End: ?
  Filled 2023-11-17: qty 2, 2d supply, fill #0
  Filled 2023-12-20: qty 2, 2d supply, fill #1
  Filled 2024-05-21 (×2): qty 2, 2d supply, fill #2

## 2023-11-17 MED ORDER — PREDNISONE 50 MG PO TABS
50.0000 mg | ORAL_TABLET | Freq: Every day | ORAL | 0 refills | Status: DC
  Filled 2023-11-17: qty 6, 6d supply, fill #0

## 2023-11-17 NOTE — Progress Notes (Signed)
 [name], DNP, AGNP-c [facility] [address] [place], [place] [address] [phone]   ACUTE VISIT- ESTABLISHED PATIENT  Blood pressure 132/84, pulse 76, weight 160 lb 9.6 oz (72.8 kg).  Subjective:  HPI Kelly [name] is a 58 y.o. female presents to day for evaluation of acute concern(s).   History of Present Illness Kelly [name] is a 58 year old female who presents with persistent cough and sinus pressure.  She has a persistent cough that is described as 'stuck' and difficult to resolve. The cough is hard and heavy, causing soreness, and she occasionally produces yellow-brown sputum, though not frequently. A course of prednisone was completed, which was helpful during the week of administration.  She experiences significant sinus pressure and headaches, particularly on the left side of her face. There is a sensation of fullness in her ears, especially the left ear, which feels like it is filled with 'five hundred cotton balls.' She has face pain and a pounding headache, which she attributes to sinus pressure. She received Botox two weeks ago and notes that she should not be experiencing headaches post-treatment. No ear pain despite the sensation of fullness. Headaches and face pain are confirmed, particularly on the left side.  In the past, she has had issues with recurrent urinary tract infections (UTIs) but reports that taking Macrobid an hour after intercourse has been effective in preventing them. She has not had any UTIs since starting this regimen.  ROS negative except for what is listed in HPI. History, Medications, Surgery, SDOH, and Family History reviewed and updated as appropriate.  Objective:  Physical Exam Vitals and nursing note reviewed.  Constitutional:      General: She is not in acute distress.    Appearance: Normal appearance.  HENT:     Head: Normocephalic.     Right Ear: Tympanic membrane normal.     Left Ear: Tenderness present. A  middle ear effusion is present.     Nose:     Right Turbinates: Enlarged and swollen.     Left Turbinates: Enlarged and swollen.     Right Sinus: Maxillary sinus tenderness and frontal sinus tenderness present.     Left Sinus: Maxillary sinus tenderness and frontal sinus tenderness present.     Mouth/Throat:     Mouth: Mucous membranes are moist.     Pharynx: Oropharynx is clear.  Eyes:     Conjunctiva/sclera: Conjunctivae normal.  Cardiovascular:     Rate and Rhythm: Normal rate and regular rhythm.     Pulses: Normal pulses.     Heart sounds: Normal heart sounds.  Pulmonary:     Effort: Pulmonary effort is normal.     Breath sounds: Normal breath sounds.  Abdominal:     Palpations: Abdomen is soft.  Lymphadenopathy:     Cervical: Cervical adenopathy present.  Skin:    General: Skin is warm and dry.     Capillary Refill: Capillary refill takes less than 2 seconds.  Neurological:     General: No focal deficit present.     Mental Status: She is alert and oriented to person, place, and time.  Psychiatric:        Behavior: Behavior normal.         Assessment & Plan:   Problem List Items Addressed This Visit     Upper respiratory tract infection   Symptoms suggestive of sinusitis include persistent cough, ear fullness, and left-sided facial pain. The left ear appears infected with fluid present.  The productive cough with yellow-brown sputum suggests a bacterial infection. Completed prednisone course was beneficial. Sinus pressure headaches persist despite recent Botox for migraines. Prefers smaller pills, hence azithromycin (Z-Pak) is chosen over Augmentin. If symptoms do not improve by Monday, alternative treatments will be considered. - Prescribe Z-Pak (azithromycin) for the infection. - Prescribe prednisone to manage inflammation and sinus pressure. - Prescribe Diflucan as prophylaxis against yeast infection. - Reassess symptoms by Monday and consider alternative treatments  if no improvement.      Relevant Medications   predniSONE (DELTASONE) 50 MG tablet   azithromycin (ZITHROMAX) 250 MG tablet   fluconazole (DIFLUCAN) 150 MG tablet   Recurrent UTI   Recurrent UTIs are well-managed with post-coital prophylaxis using Macrobid. She has not experienced any UTIs since starting this regimen and is satisfied with its effectiveness. - Continue Macrobid post-coital prophylaxis as previously prescribed.      Relevant Medications   azithromycin (ZITHROMAX) 250 MG tablet   fluconazole (DIFLUCAN) 150 MG tablet   Other Visit Diagnoses       Acute non-recurrent pansinusitis    -  Primary   Relevant Medications   predniSONE (DELTASONE) 50 MG tablet   azithromycin (ZITHROMAX) 250 MG tablet   fluconazole (DIFLUCAN) 150 MG tablet         [name], DNP, AGNP-c

## 2023-11-20 ENCOUNTER — Encounter: Payer: Self-pay | Admitting: Nurse Practitioner

## 2023-11-20 DIAGNOSIS — N390 Urinary tract infection, site not specified: Secondary | ICD-10-CM | POA: Insufficient documentation

## 2023-11-20 NOTE — Assessment & Plan Note (Signed)
 Recurrent UTIs are well-managed with post-coital prophylaxis using Macrobid. She has not experienced any UTIs since starting this regimen and is satisfied with its effectiveness. - Continue Macrobid post-coital prophylaxis as previously prescribed.

## 2023-11-20 NOTE — Assessment & Plan Note (Signed)
 Symptoms suggestive of sinusitis include persistent cough, ear fullness, and left-sided facial pain. The left ear appears infected with fluid present. The productive cough with yellow-brown sputum suggests a bacterial infection. Completed prednisone course was beneficial. Sinus pressure headaches persist despite recent Botox for migraines. Prefers smaller pills, hence azithromycin (Z-Pak) is chosen over Augmentin. If symptoms do not improve by Monday, alternative treatments will be considered. - Prescribe Z-Pak (azithromycin) for the infection. - Prescribe prednisone to manage inflammation and sinus pressure. - Prescribe Diflucan as prophylaxis against yeast infection. - Reassess symptoms by Monday and consider alternative treatments if no improvement.

## 2023-11-21 ENCOUNTER — Ambulatory Visit: Admitting: Nurse Practitioner

## 2023-11-21 ENCOUNTER — Other Ambulatory Visit (HOSPITAL_COMMUNITY): Payer: Self-pay

## 2023-11-21 ENCOUNTER — Encounter: Payer: Self-pay | Admitting: Nurse Practitioner

## 2023-11-21 VITALS — BP 132/84 | HR 105 | Wt 160.0 lb

## 2023-11-21 DIAGNOSIS — S0501XA Injury of conjunctiva and corneal abrasion without foreign body, right eye, initial encounter: Secondary | ICD-10-CM | POA: Diagnosis not present

## 2023-11-21 MED ORDER — NEOMYCIN-POLYMYXIN-DEXAMETH 3.5-10000-0.1 OP SUSP
1.0000 [drp] | OPHTHALMIC | 0 refills | Status: DC
  Filled 2023-11-21: qty 5, 15d supply, fill #0

## 2023-11-21 MED ORDER — BACITRACIN-POLYMYXIN B 500-10000 UNIT/GM OP OINT
1.0000 | TOPICAL_OINTMENT | Freq: Two times a day (BID) | OPHTHALMIC | 0 refills | Status: DC
  Filled 2023-11-21: qty 3.5, 5d supply, fill #0

## 2023-11-21 MED ORDER — CARBOXYMETH-GLYC-POLYSORB PF 0.5-1-0.5 % OP SOLN
OPHTHALMIC | 0 refills | Status: DC
  Filled 2023-11-21: qty 30, 30d supply, fill #0

## 2023-11-21 MED ORDER — KETOROLAC TROMETHAMINE 0.5 % OP SOLN
OPHTHALMIC | 0 refills | Status: DC
  Filled 2023-11-21: qty 3, 4d supply, fill #0
  Filled 2023-11-21: qty 5, 30d supply, fill #0
  Filled 2023-11-21: qty 3, 4d supply, fill #0

## 2023-11-21 NOTE — Progress Notes (Signed)
 [name], DNP, AGNP-c [facility] [address] [place], [place] [address] [phone]   ACUTE VISIT- ESTABLISHED PATIENT  Blood pressure 132/84, pulse (!) 105, weight 160 lb (72.6 kg).  Subjective:  HPI Kelly [name] is a 58 y.o. female presents to day for evaluation of acute concern(s).  Discussed the use of AI scribe software for clinical note transcription with the patient, who gave verbal consent to proceed.  History of Present Illness Kelly [name] is a 58 year old female who presents with acute eye pain and irritation.  She woke up at 3:00 AM with a sensation of 'acid in my eye,' which was very irritating. Despite attempts to rinse her eye and use eye drops, the eye continued to water profusely. The pain is described as being located 'up high' and feels as though she may have scratched her eye. No itching was noted the previous day, and she has not had pink eye for many years.  She suspects that she may have scratched her eye while sleeping or possibly has a foreign body in the eye due to pollen exposure. No crusting upon waking. She has a history of chronic dry mouth, which she believes contributes to her dry eyes.  During the review of symptoms, she reports intense pain localized above the eyelid and discomfort when moving her eye. No sensation of movement within the eye.    ROS negative except for what is listed in HPI. History, Medications, Surgery, SDOH, and Family History reviewed and updated as appropriate.  Objective:  Physical Exam Vitals and nursing note reviewed.  Eyes:     General: Lids are everted, no foreign bodies appreciated.     Extraocular Movements: Extraocular movements intact.     Conjunctiva/sclera:     Right eye: Right conjunctiva is injected.     Comments: Swelling of the right eyelid present. Clear tearing present. Florescence stain of the eye shows appx 2mm abrasion to the cornea around the 11 oclock position of the eye,  under the eyelid.          Assessment & Plan:   Problem List Items Addressed This Visit   None Visit Diagnoses       Abrasion of right cornea, initial encounter    -  Primary   Relevant Medications   Carboxymeth-Glyc-Polysorb PF 0.5-1-0.5 % SOLN   ketorolac (ACULAR) 0.5 % ophthalmic solution   neomycin-polymyxin b-dexamethasone (MAXITROL) 3.5-10000-0.1 SUSP      Assessment and Plan Assessment & Plan Corneal Abrasion Acute onset of right eye pain, tearing, and irritation since Kelly [name] this morning. No crusting or itching. Chronic dry eyes present. Examination reveals a corneal abrasion, likely due to mechanical trauma, possibly from a foreign body such as pollen. Significant discomfort is present, but healing is expected to be rapid. Numbing drops provided immediate relief but are not recommended for long-term use due to potential worsening of the condition by preventing sensation and increasing the risk of further injury. Antibiotic drops are advised to prevent bacterial infection, and soothing drops with a steroid component are recommended to reduce inflammation and provide comfort. Eye patching is suggested to minimize blinking and promote healing. - Apply an eye patch to minimize blinking and promote healing for approximately one day. - Prescribe antibiotic eye drops to prevent bacterial infection. - Prescribe soothing eye drops with a steroid component to reduce inflammation and provide comfort. - Instruct to use soothing eye drops as needed for comfort. - Advise to return tomorrow for re-evaluation  [name], DNP, AGNP-c

## 2023-11-21 NOTE — Progress Notes (Unsigned)
  [name], DNP, AGNP-c [facility] [address] [place], [place] [address] [phone]   ACUTE VISIT- ESTABLISHED PATIENT  There were no vitals taken for this visit.  Subjective:  HPI Kelly [name] is a 58 y.o. female presents to day for evaluation of acute concern(s).    ROS negative except for what is listed in HPI. History, Medications, Surgery, SDOH, and Family History reviewed and updated as appropriate.  Objective:  Physical Exam      Assessment & Plan:   Problem List Items Addressed This Visit   None     [name], DNP, AGNP-c

## 2023-11-21 NOTE — Patient Instructions (Signed)
 Corneal Abrasion  A corneal abrasion is a scratch or injury to the clear tissue at the front of the eye (cornea). It can be very painful. The cornea protects the eye and helps the eye focus. The cornea is made up of many layers, but the surface layer is one of the most sensitive tissues in the body. A corneal abrasion heals fast when it is treated. If it is not treated, it can become infected and cause an open sore (ulcer). This can lead to scarring, and the scarring can affect vision. Sometimes after the abrasion heals, another abrasion can come back in the same place. What are the causes? A corneal abrasion may be caused by: A poke to the eye. A gritty or irritating substance (foreign body) in the eye. Too much eye rubbing. Very dry eyes. Certain eye infections. Contact lenses that do not fit right or are worn for too long. Injury can also happen when putting in contact lenses or taking them out. Eye surgery. Certain cornea problems that make it more likely to get a corneal abrasion. Sometimes, the cause is not known. What are the signs or symptoms? Symptoms of this condition include: Eye pain. The pain may get worse when you open, close, or move your eye. A feeling that something is poking your eye or is stuck in your eye. Tearing, redness, and sensitivity to light. Having trouble keeping your eye open, or not being able to keep it open. Blurred vision. Headache. How is this diagnosed? A corneal abrasion may be diagnosed based on your medical history, symptoms, and an eye exam. You may need to see a doctor who specializes in conditions of the eye (ophthalmologist or optometrist). Before the eye exam, you may be given eye drops that numb the eye. You may also have dye put in your eye with a dropper or a small paper strip. This dye helps your eye doctor see your injury better when using a light or an eye scope (slit lamp). How is this treated? Treatment may vary based on what caused the  corneal abrasion. It may include: Washing out your eye. Taking out anything that is stuck in your eye. Using antibiotic drops or ointment to treat or prevent an infection. Using eye drops that lessens inflammation and pain. Using steroid drops or ointment to treat redness, irritation, or inflammation. Applying a cold, wet cloth (cold compress) or ice pack to lessen the pain. Taking pain medicines. This may include over-the-counter medicines like ibuprofen. If you have a large or deep corneal abrasion, an opioid medicine may be prescribed. For large abrasions, an eye patch or a bandage soft contact lens might also be used. A bandage soft contact lens protects the cornea while it is healing. These are not used if the corneal abrasion is related to wearing contact lenses. This is because you may be more likely to get an eye infection. Follow these instructions at home: Medicines If you were prescribed eye drops or ointment, use them as told by your provider. You may be told to use: Antibiotic eye drops or ointment. Do not stop using them even if you start to feel better. Eye drops that moisten the eye (lubricating eye drops). Ask your provider if the medicine prescribed to you: Requires you to avoid driving or using machinery. Can cause constipation. You may need to take these actions to prevent or treat constipation: Drink enough fluid to keep your pee (urine) pale yellow. Take over-the-counter or prescription medicines. Eat foods that  are high in fiber, such as beans, whole grains, and fresh fruits and vegetables. Limit foods that are high in fat and processed sugars, such as fried or sweet foods. Take over-the-counter and prescription medicines only as told by your provider. Eye care Rest your eyes. Avoid bright light and overusing (straining) your eyes. Wear sunglasses. Do not rub or touch your eye. Do not wash out your eye. Ask your provider if you can use a cold compress on your eye to  lessen pain. If you have an eye patch: Wear it as told by your provider. Follow your provider's instructions about when to take it off. If you have a bandage soft contact lens, your provider will take it off during your follow-up visit. Do not wear your own contact lenses until your provider says it is okay. General instructions Do not drive or use machinery as told by your provider. You may not be able to judge distances as well if your vision is affected or if you are wearing an eye patch. Keep all follow-up visits. This helps to prevent infection and vision loss. Contact a health care provider if: You keep having eye pain and other symptoms for more than 2-3 days. You have new symptoms, such as more redness, tearing, or fluid (discharge) coming from your eye. You have swollen eyelids. Your vision gets much worse. You have discharge that makes your eyelids stick together in the morning. Your eye patch becomes so loose that you can blink your eye. Symptoms come back after your abrasion heals. Get help right away if: You have severe eye pain that does not get better with medicine. You have severe vision loss. This information is not intended to replace advice given to you by your health care provider. Make sure you discuss any questions you have with your health care provider. Document Revised: 08/25/2022 Document Reviewed: 08/25/2022 Elsevier Patient Education  2024 [employer].

## 2023-11-22 ENCOUNTER — Encounter: Payer: Self-pay | Admitting: Nurse Practitioner

## 2023-11-22 ENCOUNTER — Ambulatory Visit (INDEPENDENT_AMBULATORY_CARE_PROVIDER_SITE_OTHER): Admitting: Nurse Practitioner

## 2023-11-22 VITALS — BP 124/82 | HR 74 | Wt 160.0 lb

## 2023-11-22 DIAGNOSIS — S0501XA Injury of conjunctiva and corneal abrasion without foreign body, right eye, initial encounter: Secondary | ICD-10-CM | POA: Insufficient documentation

## 2023-11-22 DIAGNOSIS — S0501XD Injury of conjunctiva and corneal abrasion without foreign body, right eye, subsequent encounter: Secondary | ICD-10-CM

## 2023-11-22 DIAGNOSIS — F411 Generalized anxiety disorder: Secondary | ICD-10-CM | POA: Diagnosis not present

## 2023-11-22 NOTE — Assessment & Plan Note (Signed)
 Improvement in both subjective and objective symptoms associated with abrasion to the right cornea. Re-evaluation shows a decrease in reflection with fluorescein indicating that the area is healing well. Continue with current eye drops for inflammation, pain, and infection prevention. Use caution with the right eye, OK to continue with patching to reduce the risk of re-aggravating the abrasion with eye movement. Follow-up if the symptoms do not improve or worsen.

## 2023-11-27 ENCOUNTER — Other Ambulatory Visit (HOSPITAL_COMMUNITY): Payer: Self-pay

## 2023-11-27 ENCOUNTER — Other Ambulatory Visit: Payer: Self-pay | Admitting: Obstetrics & Gynecology

## 2023-11-27 DIAGNOSIS — F411 Generalized anxiety disorder: Secondary | ICD-10-CM | POA: Diagnosis not present

## 2023-11-27 DIAGNOSIS — B009 Herpesviral infection, unspecified: Secondary | ICD-10-CM

## 2023-11-27 MED ORDER — VALACYCLOVIR HCL 1 G PO TABS
1000.0000 mg | ORAL_TABLET | Freq: Every day | ORAL | 3 refills | Status: AC
  Filled 2023-11-27: qty 90, 90d supply, fill #0
  Filled 2024-03-14: qty 90, 90d supply, fill #1
  Filled 2024-06-27: qty 90, 90d supply, fill #2
  Filled 2024-09-20: qty 90, 90d supply, fill #0
  Filled 2024-09-20: qty 90, 90d supply, fill #3

## 2023-11-27 NOTE — Telephone Encounter (Signed)
 Med refill request: valtrex  Last AEX: 05/26/21 last OV 05/08/23 Next AEX:Not scheduled message has been sent to scheduling department  Last MMG (if hormonal med) 11/30/22 birads cat 1 neg  Refill authorized:  Last rx 11/14/22 #90 with 3 refills. Please approve or deny

## 2023-11-29 ENCOUNTER — Other Ambulatory Visit (HOSPITAL_COMMUNITY): Payer: Self-pay

## 2023-11-29 DIAGNOSIS — M5416 Radiculopathy, lumbar region: Secondary | ICD-10-CM | POA: Diagnosis not present

## 2023-11-29 DIAGNOSIS — Q761 Klippel-Feil syndrome: Secondary | ICD-10-CM | POA: Diagnosis not present

## 2023-11-29 DIAGNOSIS — F1120 Opioid dependence, uncomplicated: Secondary | ICD-10-CM | POA: Diagnosis not present

## 2023-11-29 DIAGNOSIS — M5412 Radiculopathy, cervical region: Secondary | ICD-10-CM | POA: Diagnosis not present

## 2023-11-29 MED ORDER — OXYCODONE-ACETAMINOPHEN 5-325 MG PO TABS
1.0000 | ORAL_TABLET | Freq: Three times a day (TID) | ORAL | 0 refills | Status: DC | PRN
  Filled 2023-11-29: qty 84, 28d supply, fill #0
  Filled 2023-12-14: qty 35, 12d supply, fill #0
  Filled 2024-01-11 (×2): qty 84, 28d supply, fill #0

## 2023-11-29 MED ORDER — OXYCODONE-ACETAMINOPHEN 5-325 MG PO TABS
1.0000 | ORAL_TABLET | Freq: Three times a day (TID) | ORAL | 0 refills | Status: DC | PRN
  Filled 2023-11-29 – 2024-02-09 (×3): qty 84, 28d supply, fill #0

## 2023-11-29 MED ORDER — OXYCODONE-ACETAMINOPHEN 5-325 MG PO TABS
1.0000 | ORAL_TABLET | Freq: Three times a day (TID) | ORAL | 0 refills | Status: DC | PRN
  Filled 2023-11-29 – 2023-12-15 (×2): qty 84, 28d supply, fill #0

## 2023-12-04 DIAGNOSIS — F411 Generalized anxiety disorder: Secondary | ICD-10-CM | POA: Diagnosis not present

## 2023-12-06 ENCOUNTER — Other Ambulatory Visit (HOSPITAL_COMMUNITY): Payer: Self-pay

## 2023-12-06 DIAGNOSIS — Z1231 Encounter for screening mammogram for malignant neoplasm of breast: Secondary | ICD-10-CM | POA: Diagnosis not present

## 2023-12-07 ENCOUNTER — Other Ambulatory Visit (HOSPITAL_COMMUNITY): Payer: Self-pay

## 2023-12-07 ENCOUNTER — Encounter: Payer: Self-pay | Admitting: Radiology

## 2023-12-14 ENCOUNTER — Other Ambulatory Visit (HOSPITAL_COMMUNITY): Payer: Self-pay

## 2023-12-15 ENCOUNTER — Other Ambulatory Visit (HOSPITAL_COMMUNITY): Payer: Self-pay

## 2023-12-15 ENCOUNTER — Other Ambulatory Visit: Payer: Self-pay

## 2023-12-20 ENCOUNTER — Other Ambulatory Visit (HOSPITAL_COMMUNITY): Payer: Self-pay

## 2023-12-20 ENCOUNTER — Other Ambulatory Visit: Payer: Self-pay

## 2023-12-20 ENCOUNTER — Other Ambulatory Visit: Payer: Self-pay | Admitting: Neurology

## 2023-12-20 DIAGNOSIS — F411 Generalized anxiety disorder: Secondary | ICD-10-CM | POA: Diagnosis not present

## 2023-12-25 DIAGNOSIS — F411 Generalized anxiety disorder: Secondary | ICD-10-CM | POA: Diagnosis not present

## 2023-12-26 ENCOUNTER — Other Ambulatory Visit (HOSPITAL_COMMUNITY): Payer: Self-pay

## 2023-12-26 MED ORDER — TOPIRAMATE 50 MG PO TABS
50.0000 mg | ORAL_TABLET | Freq: Two times a day (BID) | ORAL | 3 refills | Status: AC
  Filled 2023-12-26: qty 180, 90d supply, fill #0
  Filled 2024-04-19: qty 180, 90d supply, fill #1
  Filled 2024-07-22: qty 180, 90d supply, fill #2

## 2023-12-27 ENCOUNTER — Other Ambulatory Visit (HOSPITAL_COMMUNITY): Payer: Self-pay

## 2023-12-27 ENCOUNTER — Other Ambulatory Visit: Payer: Self-pay

## 2024-01-01 DIAGNOSIS — F411 Generalized anxiety disorder: Secondary | ICD-10-CM | POA: Diagnosis not present

## 2024-01-03 ENCOUNTER — Other Ambulatory Visit (HOSPITAL_COMMUNITY): Payer: Self-pay

## 2024-01-04 DIAGNOSIS — M25512 Pain in left shoulder: Secondary | ICD-10-CM | POA: Diagnosis not present

## 2024-01-08 DIAGNOSIS — F411 Generalized anxiety disorder: Secondary | ICD-10-CM | POA: Diagnosis not present

## 2024-01-11 ENCOUNTER — Other Ambulatory Visit (HOSPITAL_BASED_OUTPATIENT_CLINIC_OR_DEPARTMENT_OTHER): Payer: Self-pay

## 2024-01-11 ENCOUNTER — Other Ambulatory Visit (HOSPITAL_COMMUNITY): Payer: Self-pay

## 2024-01-12 ENCOUNTER — Other Ambulatory Visit (HOSPITAL_COMMUNITY): Payer: Self-pay

## 2024-01-16 ENCOUNTER — Other Ambulatory Visit (HOSPITAL_COMMUNITY): Payer: Self-pay

## 2024-01-17 ENCOUNTER — Other Ambulatory Visit: Payer: Self-pay

## 2024-01-17 DIAGNOSIS — F4320 Adjustment disorder, unspecified: Secondary | ICD-10-CM | POA: Diagnosis not present

## 2024-01-17 NOTE — Progress Notes (Signed)
 Specialty Pharmacy Refill Coordination Note  ADONAI HELZER is a 58 y.o. female contacted today regarding refills of specialty medication(s) OnabotulinumtoxinA  (Botox )   Patient requested (Patient-Rptd) Delivery   Delivery date: (Patient-Rptd) 01/22/24   Verified address: [facility] [address]   Medication will be filled on 05.30.25.

## 2024-01-19 ENCOUNTER — Other Ambulatory Visit: Payer: Self-pay

## 2024-01-23 ENCOUNTER — Other Ambulatory Visit: Payer: Self-pay

## 2024-01-29 DIAGNOSIS — M25512 Pain in left shoulder: Secondary | ICD-10-CM | POA: Diagnosis not present

## 2024-01-30 ENCOUNTER — Ambulatory Visit (INDEPENDENT_AMBULATORY_CARE_PROVIDER_SITE_OTHER): Admitting: Neurology

## 2024-01-30 VITALS — BP 125/84 | HR 90

## 2024-01-30 DIAGNOSIS — G43709 Chronic migraine without aura, not intractable, without status migrainosus: Secondary | ICD-10-CM

## 2024-01-30 MED ORDER — ONABOTULINUMTOXINA 200 UNITS IJ SOLR
155.0000 [IU] | Freq: Once | INTRAMUSCULAR | Status: AC
  Administered 2024-01-30: 155 [IU] via INTRAMUSCULAR

## 2024-01-30 NOTE — Progress Notes (Signed)
 Botox - 200 units x 1 vial Lot: [id number] Expiration: 05/2026 NDC: 9811-9147-82  Bacteriostatic 0.9% Sodium Chloride - 4 mL  Lot: [id number] Expiration: 01/2025 NDC: 0865-7846-96  Dx: E95.284  S/P  Witnessed by Deidre Fat

## 2024-01-30 NOTE — Progress Notes (Signed)
 Consent Form Botulism Toxin Injection For Chronic Migraine  01/30/2024: 60% - 70 improvement in migraine and headache frequency  11/02/2023: minimum > 60% - 70 improvement in migraine and headache frequency  08/13/2023: Stable, still doing excellent!   04/19/2023: doing excellent, stable or better  01/24/2023: doong great, daughter graduated HS and will see me for migraines. minimum > 60% - 70 improvement in migraine and headache frequency  08/08/2022: stable doing well, minimum > 60% - 70 improvement in migraine and headache frequency  05/12/2022: She will get migrianes 2 weeks after botox  when the botox  has worn off. This has happened a few times, takes nurtec and then kicks in. after that she does great no more than 4 migraine days a month and no headache days. At baseline she had daily headaches prior to botox .   02/17/2022: >>90% improvement in migraine frequency.  Reviewed orally with patient, additionally signature is on file:  Botulism toxin has been approved by the Federal drug administration for treatment of chronic migraine. Botulism toxin does not cure chronic migraine and it may not be effective in some patients.  The administration of botulism toxin is accomplished by injecting a small amount of toxin into the muscles of the neck and head. Dosage must be titrated for each individual. Any benefits resulting from botulism toxin tend to wear off after 3 months with a repeat injection required if benefit is to be maintained. Injections are usually done every 3-4 months with maximum effect peak achieved by about 2 or 3 weeks. Botulism toxin is expensive and you should be sure of what costs you will incur resulting from the injection.  The side effects of botulism toxin use for chronic migraine may include:   -Transient, and usually mild, facial weakness with facial injections  -Transient, and usually mild, head or neck weakness with head/neck injections  -Reduction or loss of forehead  facial animation due to forehead muscle weakness  -Eyelid drooping  -Dry eye  -Pain at the site of injection or bruising at the site of injection  -Double vision  -Potential unknown long term risks  Contraindications: You should not have Botox  if you are pregnant, nursing, allergic to albumin, have an infection, skin condition, or muscle weakness at the site of the injection, or have myasthenia gravis, Lambert-Eaton syndrome, or ALS.  It is also possible that as with any injection, there may be an allergic reaction or no effect from the medication. Reduced effectiveness after repeated injections is sometimes seen and rarely infection at the injection site may occur. All care will be taken to prevent these side effects. If therapy is given over a long time, atrophy and wasting in the muscle injected may occur. Occasionally the patient's become refractory to treatment because they develop antibodies to the toxin. In this event, therapy needs to be modified.  I have read the above information and consent to the administration of botulism toxin.    BOTOX  PROCEDURE NOTE FOR MIGRAINE HEADACHE    Contraindications and precautions discussed with patient(above). Aseptic procedure was observed and patient tolerated procedure. Procedure performed by Dr. Criselda Dolly  The condition has existed for more than 6 months, and pt does not have a diagnosis of ALS, Myasthenia Gravis or Lambert-Eaton Syndrome.  Risks and benefits of injections discussed and pt agrees to proceed with the procedure.  Written consent obtained  These injections are medically necessary. Pt  receives good benefits from these injections. These injections do not cause sedations or hallucinations which the oral  therapies may cause.  Description of procedure:  The patient was placed in a sitting position. The standard protocol was used for Botox  as follows, with 5 units of Botox  injected at each site:   -Procerus muscle, midline  injection  -Corrugator muscle, bilateral injection  -Frontalis muscle, bilateral injection, with 2 sites each side, medial injection was performed in the upper one third of the frontalis muscle, in the region vertical from the medial inferior edge of the superior orbital rim. The lateral injection was again in the upper one third of the forehead vertically above the lateral limbus of the cornea, 1.5 cm lateral to the medial injection site.  -Temporalis muscle injection, 4 sites, bilaterally. The first injection was 3 cm above the tragus of the ear, second injection site was 1.5 cm to 3 cm up from the first injection site in line with the tragus of the ear. The third injection site was 1.5-3 cm forward between the first 2 injection sites. The fourth injection site was 1.5 cm posterior to the second injection site.   -Occipitalis muscle injection, 3 sites, bilaterally. The first injection was done one half way between the occipital protuberance and the tip of the mastoid process behind the ear. The second injection site was done lateral and superior to the first, 1 fingerbreadth from the first injection. The third injection site was 1 fingerbreadth superiorly and medially from the first injection site.  -Cervical paraspinal muscle injection, 2 sites, bilateral knee first injection site was 1 cm from the midline of the cervical spine, 3 cm inferior to the lower border of the occipital protuberance. The second injection site was 1.5 cm superiorly and laterally to the first injection site.  -Trapezius muscle injection was performed at 3 sites, bilaterally. The first injection site was in the upper trapezius muscle halfway between the inflection point of the neck, and the acromion. The second injection site was one half way between the acromion and the first injection site. The third injection was done between the first injection site and the inflection point of the neck.   Will return for repeat injection  in 3 months.   155 units of Botox  was used, 45u Botox  not injected was wasted. The patient tolerated the procedure well, there were no complications of the above procedure.

## 2024-02-07 ENCOUNTER — Other Ambulatory Visit: Payer: Self-pay

## 2024-02-07 ENCOUNTER — Other Ambulatory Visit (HOSPITAL_COMMUNITY): Payer: Self-pay

## 2024-02-08 ENCOUNTER — Other Ambulatory Visit: Payer: Self-pay

## 2024-02-08 ENCOUNTER — Encounter: Admitting: Neurology

## 2024-02-08 ENCOUNTER — Other Ambulatory Visit (HOSPITAL_COMMUNITY): Payer: Self-pay

## 2024-02-09 ENCOUNTER — Other Ambulatory Visit: Payer: Self-pay

## 2024-02-09 ENCOUNTER — Other Ambulatory Visit (HOSPITAL_COMMUNITY): Payer: Self-pay

## 2024-02-09 DIAGNOSIS — M25512 Pain in left shoulder: Secondary | ICD-10-CM | POA: Diagnosis not present

## 2024-02-12 ENCOUNTER — Other Ambulatory Visit (HOSPITAL_COMMUNITY): Payer: Self-pay

## 2024-02-12 DIAGNOSIS — Q761 Klippel-Feil syndrome: Secondary | ICD-10-CM | POA: Diagnosis not present

## 2024-02-12 DIAGNOSIS — M5412 Radiculopathy, cervical region: Secondary | ICD-10-CM | POA: Diagnosis not present

## 2024-02-12 DIAGNOSIS — F1120 Opioid dependence, uncomplicated: Secondary | ICD-10-CM | POA: Diagnosis not present

## 2024-02-12 MED ORDER — PREGABALIN 100 MG PO CAPS
100.0000 mg | ORAL_CAPSULE | Freq: Two times a day (BID) | ORAL | 1 refills | Status: DC
  Filled 2024-02-21: qty 60, 30d supply, fill #0
  Filled 2024-03-22: qty 60, 30d supply, fill #1
  Filled 2024-04-19: qty 60, 30d supply, fill #2
  Filled 2024-05-21: qty 60, 30d supply, fill #3

## 2024-02-12 MED ORDER — OXYCODONE-ACETAMINOPHEN 5-325 MG PO TABS
1.0000 | ORAL_TABLET | Freq: Three times a day (TID) | ORAL | 0 refills | Status: DC | PRN
  Filled 2024-03-06 – 2024-04-03 (×2): qty 84, 28d supply, fill #0

## 2024-02-12 MED ORDER — OXYCODONE-ACETAMINOPHEN 5-325 MG PO TABS
1.0000 | ORAL_TABLET | Freq: Three times a day (TID) | ORAL | 0 refills | Status: DC | PRN
  Filled 2024-04-29: qty 84, 28d supply, fill #0

## 2024-02-12 MED ORDER — OXYCODONE-ACETAMINOPHEN 5-325 MG PO TABS
1.0000 | ORAL_TABLET | Freq: Three times a day (TID) | ORAL | 0 refills | Status: DC | PRN
  Filled 2024-03-08: qty 84, 28d supply, fill #0

## 2024-02-15 ENCOUNTER — Ambulatory Visit (INDEPENDENT_AMBULATORY_CARE_PROVIDER_SITE_OTHER): Admitting: Neurology

## 2024-02-15 VITALS — BP 128/84 | HR 66

## 2024-02-15 DIAGNOSIS — M5412 Radiculopathy, cervical region: Secondary | ICD-10-CM | POA: Diagnosis not present

## 2024-02-15 NOTE — Progress Notes (Unsigned)
 Full Name: Kelly [name] Gender: Female MRN #: [id number] Date of Birth: 24-Nov-1965    Visit Date: 02/15/2024 08:13 Age: 58 Years  History: Referred for cervical radiculopathy requested EMG/NCS. More on the left. Digit 1 is numb continuously. Chronic neck pain and shoulder pain.   Summary: EMG/NCS was performed on the bilateral upper extremities. All nerves (as indicated in the following tables) were within normal limits.  The lower left cervical paraspinals showed spontaneous activity. All remaining muscles (as indicated in the following tables) were within normal limits.       Conclusion: There is no evidence of polyneuropathy or entrapment neuropathy on conductions. EMG shows acute/ongoing denervation isolated to the lower left cervical paraspinals, consistent with radiculopathy. Given the lack of similar changes in limb muscles and considerable overlap in paraspinal innervation, a more precise localization cannot be identified at this time  ------------------------------- Onetha Epp, M.D.  [facility] [address] [place], [place] [address] Tel: [phone] Fax: [phone]  Verbal informed consent was obtained from the patient, patient was informed of potential risk of procedure, including bruising, bleeding, hematoma formation, infection, muscle weakness, muscle pain, numbness, among others.        MNC    Nerve / Sites Muscle Latency Ref. Amplitude Ref. Rel Amp Segments Distance Velocity Ref. Area    ms ms mV mV %  cm m/s m/s mVms  R Median - APB     Wrist APB 3.1 <=4.4 4.6 >=4.0 100 Wrist - APB 7   18.8     Upper arm APB 7.0  4.8  104 Upper arm - Wrist 21 54 >=49 20.8  L Median - APB     Wrist APB 3.0 <=4.4 4.7 >=4.0 100 Wrist - APB 7   20.1     Upper arm APB 7.1  4.1  86.9 Upper arm - Wrist 22 53 >=49 19.1  R Ulnar - ADM     Wrist ADM 2.4 <=3.3 9.3 >=6.0 100 Wrist - ADM 7   36.4     B.Elbow ADM 5.4  6.3  67.6 B.Elbow - Wrist 19 62  >=49 23.8     A.Elbow ADM 6.5  9.4  150 A.Elbow - B.Elbow 7 65 >=49 33.5  L Ulnar - ADM     Wrist ADM 2.2 <=3.3 9.6 >=6.0 100 Wrist - ADM 7   32.8     B.Elbow ADM 5.1  6.4  67 B.Elbow - Wrist 20 69 >=49 21.4     A.Elbow ADM 6.4  8.2  128 A.Elbow - B.Elbow 9 71 >=49 29.5             SNC    Nerve / Sites Rec. Site Peak Lat Ref.  Amp Ref. Segments Distance Peak Diff Ref.    ms ms V V  cm ms ms  R Ulnar - Digit V (Antidromic)     Wrist Dig V 2.1 <=3.1 27 >=17 Wrist - Dig V 11    L Ulnar - Digit V (Antidromic)     Wrist Dig V 2.7 <=3.1 21 >=17 Wrist - Dig V 11    L Radial - Anatomical snuff box (Forearm)     Forearm Wrist 2.3 <=2.9 19 >=15 Forearm - Wrist 10    R Median, Ulnar - Transcarpal comparison     Median Palm Wrist 1.8 <=2.2 62 >=35 Median Palm - Wrist 8       Ulnar Palm Wrist 1.6 <=2.2  53 >=12 Ulnar Palm - Wrist 8          Median Palm - Ulnar Palm  0.2 <=0.4  L Median, Ulnar - Transcarpal comparison     Median Palm Wrist 2.2 <=2.2 45 >=35 Median Palm - Wrist 8       Ulnar Palm Wrist 2.0 <=2.2 18 >=12 Ulnar Palm - Wrist 8          Median Palm - Ulnar Palm  0.2 <=0.4  R Median - Orthodromic (Dig II, Mid palm)     Dig II Wrist 2.7 <=3.4 12 >=10 Dig II - Wrist 13    L Median - Orthodromic (Dig II, Mid palm)     Dig II Wrist 3.0 <=3.4 16 >=10 Dig II - Wrist 13                     EMG Summary Table    Spontaneous MUAP Recruitment  Muscle IA Fib PSW Fasc Other Amp Dur. Poly Pattern  L. Cervical paraspinals (low) Normal None 2+ None _______ Normal Normal Normal Normal  L. Deltoid Normal None None None _______ Normal Normal Normal Normal  L. Biceps brachii Normal None None None _______ Normal Normal Normal Normal  L. Triceps brachii Normal None None None _______ Normal Normal Normal Normal  L. Opponens pollicis Normal None None None _______ Normal Normal Normal Normal  L. Pronator teres Normal None None None _______ Normal Normal Normal Normal  L. First dorsal interosseous Normal  None None None _______ Normal Normal Normal Normal

## 2024-02-16 ENCOUNTER — Ambulatory Visit (INDEPENDENT_AMBULATORY_CARE_PROVIDER_SITE_OTHER): Admitting: Radiology

## 2024-02-16 ENCOUNTER — Encounter: Payer: Self-pay | Admitting: Radiology

## 2024-02-16 VITALS — BP 116/74 | Ht 62.75 in | Wt 162.4 lb

## 2024-02-16 DIAGNOSIS — Z1331 Encounter for screening for depression: Secondary | ICD-10-CM | POA: Diagnosis not present

## 2024-02-16 DIAGNOSIS — Z01419 Encounter for gynecological examination (general) (routine) without abnormal findings: Secondary | ICD-10-CM

## 2024-02-16 NOTE — Progress Notes (Signed)
 Kelly [name] 1966/06/24 [id number]   History:  58 y.o. G4P3 presents for annual exam. Multiple co morbidities. IUD in place, amenorrheic. Was given vaginal estrogen but has not started yet. C/o weight gain. Has a PCP. Moh's surgery in January on her forehead.   Gynecologic History No LMP recorded. (Menstrual status: IUD).   Contraception/Family planning: IUD placed 06/2018 Sexually active: yes Last Pap: 3/24. Results were: normal Last mammogram: 12/06/23. Results were: normal  Obstetric History OB History  Gravida Para Term Preterm AB Living  4 3 3  1 3   SAB IAB Ectopic Multiple Live Births  1    3    # Outcome Date GA Lbr Len/2nd Weight Sex Type Anes PTL Lv  4 SAB           3 Term     F CS-Unspec  N LIV  2 Term     F CS-Unspec  N LIV  1 Term     M CS-Unspec  N LIV       02/16/2024    3:57 PM 12/23/2022    1:58 PM 12/06/2021   11:30 AM  Depression screen PHQ 2/9  Decreased Interest 1 0 0  Down, Depressed, Hopeless 0 1 0  PHQ - 2 Score 1 1 0  Altered sleeping  1   Tired, decreased energy  1   Change in appetite  0   Feeling bad or failure about yourself   2   Trouble concentrating  3   Moving slowly or fidgety/restless  0   Suicidal thoughts  0   PHQ-9 Score  8   Difficult doing work/chores  Somewhat difficult      The following portions of the patient's history were reviewed and updated as appropriate: allergies, current medications, past family history, past medical history, past social history, past surgical history, and problem list.  Review of Systems  All other systems reviewed and are negative.   Past medical history, past surgical history, family history and social history were all reviewed and documented in the EPIC chart.  Exam:  Vitals:   02/16/24 1604  BP: 116/74  SpO2: 100%  Weight: 162 lb 6.4 oz (73.7 kg)  Height: 5' 2.75 (1.594 m)   Body mass index is 29 kg/m.  Physical Exam Vitals and nursing note reviewed. Exam conducted with a  chaperone present.  Constitutional:      Appearance: Normal appearance. She is normal weight.  HENT:     Head: Normocephalic and atraumatic.  Neck:     Thyroid : No thyroid  mass, thyromegaly or thyroid  tenderness.   Cardiovascular:     Rate and Rhythm: Regular rhythm.     Heart sounds: Normal heart sounds.  Pulmonary:     Effort: Pulmonary effort is normal.     Breath sounds: Normal breath sounds.  Chest:  Breasts:    Breasts are symmetrical.     Right: Normal. No inverted nipple, mass, nipple discharge, skin change or tenderness.     Left: Normal. No inverted nipple, mass, nipple discharge, skin change or tenderness.  Abdominal:     General: Abdomen is flat. Bowel sounds are normal.     Palpations: Abdomen is soft.  Genitourinary:    General: Normal vulva.     Vagina: Normal. No vaginal discharge, bleeding or lesions.     Cervix: Normal. No discharge or lesion.     Uterus: Normal. Not enlarged and not tender.      Adnexa: Right adnexa normal  and left adnexa normal.       Right: No mass, tenderness or fullness.         Left: No mass, tenderness or fullness.       Comments: IUD strings seen in os Lymphadenopathy:     Upper Body:     Right upper body: No axillary adenopathy.     Left upper body: No axillary adenopathy.   Skin:    General: Skin is warm and dry.   Neurological:     Mental Status: She is alert and oriented to person, place, and time.   Psychiatric:        Mood and Affect: Mood normal.        Thought Content: Thought content normal.        Judgment: Judgment normal.      Darice Hoit, CMA present for exam  Assessment/Plan:    1. Well woman exam with routine gynecological exam (Primary) Pap 2027 Mammo yearly Start vaginal estrogen 2x/weel  2. Screening for depression    Return in about 1 year (around 02/15/2025) for Annual.  GINETTE COZIER B WHNP-BC 4:18 PM 02/16/2024

## 2024-02-16 NOTE — Patient Instructions (Signed)
 Preventive Care 16-57 Years Old, Female  Preventive care refers to lifestyle choices and visits with your health care provider that can promote health and wellness. Preventive care visits are also called wellness exams.  What can I expect for my preventive care visit?  Counseling  Your health care provider may ask you questions about your:  Medical history, including:  Past medical problems.  Family medical history.  Pregnancy history.  Current health, including:  Menstrual cycle.  Method of birth control.  Emotional well-being.  Home life and relationship well-being.  Sexual activity and sexual health.  Lifestyle, including:  Alcohol, nicotine or tobacco, and drug use.  Access to firearms.  Diet, exercise, and sleep habits.  Work and work Astronomer.  Sunscreen use.  Safety issues such as seatbelt and bike helmet use.  Physical exam  Your health care provider will check your:  Height and weight. These may be used to calculate your BMI (body mass index). BMI is a measurement that tells if you are at a healthy weight.  Waist circumference. This measures the distance around your waistline. This measurement also tells if you are at a healthy weight and may help predict your risk of certain diseases, such as type 2 diabetes and high blood pressure.  Heart rate and blood pressure.  Body temperature.  Skin for abnormal spots.  What immunizations do I need?    Vaccines are usually given at various ages, according to a schedule. Your health care provider will recommend vaccines for you based on your age, medical history, and lifestyle or other factors, such as travel or where you work.  What tests do I need?  Screening  Your health care provider may recommend screening tests for certain conditions. This may include:  Lipid and cholesterol levels.  Diabetes screening. This is done by checking your blood sugar (glucose) after you have not eaten for a while (fasting).  Pelvic exam and Pap test.  Hepatitis B test.  Hepatitis C  test.  HIV (human immunodeficiency virus) test.  STI (sexually transmitted infection) testing, if you are at risk.  Lung cancer screening.  Colorectal cancer screening.  Mammogram. Talk with your health care provider about when you should start having regular mammograms. This may depend on whether you have a family history of breast cancer.  BRCA-related cancer screening. This may be done if you have a family history of breast, ovarian, tubal, or peritoneal cancers.  Bone density scan. This is done to screen for osteoporosis.  Talk with your health care provider about your test results, treatment options, and if necessary, the need for more tests.  Follow these instructions at home:  Eating and drinking    Eat a diet that includes fresh fruits and vegetables, whole grains, lean protein, and low-fat dairy products.  Take vitamin and mineral supplements as recommended by your health care provider.  Do not drink alcohol if:  Your health care provider tells you not to drink.  You are pregnant, may be pregnant, or are planning to become pregnant.  If you drink alcohol:  Limit how much you have to 0-1 drink a day.  Know how much alcohol is in your drink. In the [place] one drink equals one 12 oz bottle of beer (355 mL), one 5 oz glass of wine (148 mL), or one 1 oz glass of hard liquor (44 mL).  Lifestyle  Brush your teeth every morning and night with fluoride toothpaste. Floss one time each day.  Exercise for at least  30 minutes 5 or more days each week.  Do not use any products that contain nicotine or tobacco. These products include cigarettes, chewing tobacco, and vaping devices, such as e-cigarettes. If you need help quitting, ask your health care provider.  Do not use drugs.  If you are sexually active, practice safe sex. Use a condom or other form of protection to prevent STIs.  If you do not wish to become pregnant, use a form of birth control. If you plan to become pregnant, see your health care provider for a  prepregnancy visit.  Take aspirin only as told by your health care provider. Make sure that you understand how much to take and what form to take. Work with your health care provider to find out whether it is safe and beneficial for you to take aspirin daily.  Find healthy ways to manage stress, such as:  Meditation, yoga, or listening to music.  Journaling.  Talking to a trusted person.  Spending time with friends and family.  Minimize exposure to UV radiation to reduce your risk of skin cancer.  Safety  Always wear your seat belt while driving or riding in a vehicle.  Do not drive:  If you have been drinking alcohol. Do not ride with someone who has been drinking.  When you are tired or distracted.  While texting.  If you have been using any mind-altering substances or drugs.  Wear a helmet and other protective equipment during sports activities.  If you have firearms in your house, make sure you follow all gun safety procedures.  Seek help if you have been physically or sexually abused.  What's next?  Visit your health care provider once a year for an annual wellness visit.  Ask your health care provider how often you should have your eyes and teeth checked.  Stay up to date on all vaccines.  This information is not intended to replace advice given to you by your health care provider. Make sure you discuss any questions you have with your health care provider.  Document Revised: 02/03/2021 Document Reviewed: 02/03/2021  Elsevier Patient Education  2024 [employer].

## 2024-02-19 DIAGNOSIS — M5412 Radiculopathy, cervical region: Secondary | ICD-10-CM | POA: Insufficient documentation

## 2024-02-20 ENCOUNTER — Other Ambulatory Visit: Payer: Self-pay | Admitting: Neurology

## 2024-02-20 ENCOUNTER — Other Ambulatory Visit (HOSPITAL_COMMUNITY): Payer: Self-pay

## 2024-02-21 ENCOUNTER — Other Ambulatory Visit (HOSPITAL_COMMUNITY): Payer: Self-pay

## 2024-02-21 MED ORDER — UBRELVY 100 MG PO TABS
1.0000 | ORAL_TABLET | Freq: Every day | ORAL | 3 refills | Status: DC | PRN
  Filled 2024-02-21: qty 16, 30d supply, fill #0
  Filled 2024-03-14 – 2024-03-22 (×2): qty 16, 30d supply, fill #1
  Filled 2024-04-19: qty 16, 30d supply, fill #2
  Filled 2024-05-21 (×2): qty 16, 30d supply, fill #3

## 2024-03-06 ENCOUNTER — Other Ambulatory Visit (HOSPITAL_COMMUNITY): Payer: Self-pay

## 2024-03-08 ENCOUNTER — Other Ambulatory Visit (HOSPITAL_COMMUNITY): Payer: Self-pay

## 2024-03-14 ENCOUNTER — Other Ambulatory Visit (HOSPITAL_COMMUNITY): Payer: Self-pay

## 2024-03-14 ENCOUNTER — Other Ambulatory Visit: Payer: Self-pay

## 2024-03-22 ENCOUNTER — Other Ambulatory Visit: Payer: Self-pay

## 2024-03-22 ENCOUNTER — Other Ambulatory Visit (HOSPITAL_COMMUNITY): Payer: Self-pay

## 2024-03-25 ENCOUNTER — Telehealth: Payer: Self-pay | Admitting: Neurology

## 2024-03-25 ENCOUNTER — Other Ambulatory Visit (HOSPITAL_COMMUNITY): Payer: Self-pay

## 2024-03-25 MED ORDER — CYCLOBENZAPRINE HCL 5 MG PO TABS
5.0000 mg | ORAL_TABLET | Freq: Three times a day (TID) | ORAL | 11 refills | Status: AC | PRN
  Filled 2024-03-25: qty 60, 20d supply, fill #0
  Filled 2024-04-19: qty 60, 20d supply, fill #1
  Filled 2024-05-21 (×2): qty 60, 20d supply, fill #2
  Filled 2024-07-24: qty 60, 20d supply, fill #3
  Filled 2024-08-09: qty 60, 20d supply, fill #4
  Filled 2024-09-20: qty 60, 20d supply, fill #5
  Filled 2024-09-20: qty 90, 30d supply, fill #0

## 2024-03-25 NOTE — Telephone Encounter (Signed)
 Submitted auth request via CMM, status is pending. Key: [id number]

## 2024-04-03 ENCOUNTER — Other Ambulatory Visit (HOSPITAL_COMMUNITY): Payer: Self-pay

## 2024-04-03 ENCOUNTER — Other Ambulatory Visit: Payer: Self-pay

## 2024-04-08 NOTE — Telephone Encounter (Signed)
 Auth was approved, pt will continue to fill through [facility].  Auth#: [id number] (04/12/24-04/11/25)

## 2024-04-11 ENCOUNTER — Other Ambulatory Visit (HOSPITAL_COMMUNITY): Payer: Self-pay

## 2024-04-12 ENCOUNTER — Other Ambulatory Visit: Payer: Self-pay | Admitting: Pharmacy Technician

## 2024-04-12 ENCOUNTER — Other Ambulatory Visit: Payer: Self-pay

## 2024-04-12 NOTE — Progress Notes (Signed)
 Specialty Pharmacy Refill Coordination Note  Kelly [name] is a 58 y.o. female contacted today regarding refills of specialty medication(s) OnabotulinumtoxinA  (Botox )   Patient requested Courier to Provider Office   Delivery date: 04/16/24   Verified address: [facility] [address]   Medication will be filled on 04/15/24.   Injection Appointment: 04/23/2024

## 2024-04-15 ENCOUNTER — Other Ambulatory Visit: Payer: Self-pay

## 2024-04-17 ENCOUNTER — Institutional Professional Consult (permissible substitution): Admitting: Neurology

## 2024-04-19 ENCOUNTER — Other Ambulatory Visit (HOSPITAL_COMMUNITY): Payer: Self-pay

## 2024-04-19 ENCOUNTER — Other Ambulatory Visit: Payer: Self-pay

## 2024-04-23 ENCOUNTER — Ambulatory Visit (INDEPENDENT_AMBULATORY_CARE_PROVIDER_SITE_OTHER): Admitting: Neurology

## 2024-04-23 ENCOUNTER — Encounter: Payer: Self-pay | Admitting: Neurology

## 2024-04-23 VITALS — BP 140/93 | HR 92

## 2024-04-23 DIAGNOSIS — G43709 Chronic migraine without aura, not intractable, without status migrainosus: Secondary | ICD-10-CM

## 2024-04-23 MED ORDER — ONABOTULINUMTOXINA 200 UNITS IJ SOLR
155.0000 [IU] | Freq: Once | INTRAMUSCULAR | Status: AC
  Administered 2024-04-23: 155 [IU] via INTRAMUSCULAR

## 2024-04-23 NOTE — Progress Notes (Unsigned)
 Consent Form Botulism Toxin Injection For Chronic Migraine 04/23/2024: stable doing well 01/30/2024: 60% - 70 improvement in migraine and headache frequency  11/02/2023: minimum > 60% - 70 improvement in migraine and headache frequency  08/13/2023: Stable, still doing excellent!   04/19/2023: doing excellent, stable or better  01/24/2023: doong great, daughter graduated HS and will see me for migraines. minimum > 60% - 70 improvement in migraine and headache frequency  08/08/2022: stable doing well, minimum > 60% - 70 improvement in migraine and headache frequency  05/12/2022: She will get migrianes 2 weeks after botox  when the botox  has worn off. This has happened a few times, takes nurtec and then kicks in. after that she does great no more than 4 migraine days a month and no headache days. At baseline she had daily headaches prior to botox .   02/17/2022: >>90% improvement in migraine frequency.  Reviewed orally with patient, additionally signature is on file:  Botulism toxin has been approved by the Federal drug administration for treatment of chronic migraine. Botulism toxin does not cure chronic migraine and it may not be effective in some patients.  The administration of botulism toxin is accomplished by injecting a small amount of toxin into the muscles of the neck and head. Dosage must be titrated for each individual. Any benefits resulting from botulism toxin tend to wear off after 3 months with a repeat injection required if benefit is to be maintained. Injections are usually done every 3-4 months with maximum effect peak achieved by about 2 or 3 weeks. Botulism toxin is expensive and you should be sure of what costs you will incur resulting from the injection.  The side effects of botulism toxin use for chronic migraine may include:   -Transient, and usually mild, facial weakness with facial injections  -Transient, and usually mild, head or neck weakness with head/neck  injections  -Reduction or loss of forehead facial animation due to forehead muscle weakness  -Eyelid drooping  -Dry eye  -Pain at the site of injection or bruising at the site of injection  -Double vision  -Potential unknown long term risks  Contraindications: You should not have Botox  if you are pregnant, nursing, allergic to albumin, have an infection, skin condition, or muscle weakness at the site of the injection, or have myasthenia gravis, Lambert-Eaton syndrome, or ALS.  It is also possible that as with any injection, there may be an allergic reaction or no effect from the medication. Reduced effectiveness after repeated injections is sometimes seen and rarely infection at the injection site may occur. All care will be taken to prevent these side effects. If therapy is given over a long time, atrophy and wasting in the muscle injected may occur. Occasionally the patient's become refractory to treatment because they develop antibodies to the toxin. In this event, therapy needs to be modified.  I have read the above information and consent to the administration of botulism toxin.    BOTOX  PROCEDURE NOTE FOR MIGRAINE HEADACHE    Contraindications and precautions discussed with patient(above). Aseptic procedure was observed and patient tolerated procedure. Procedure performed by Dr. Andree Epp  The condition has existed for more than 6 months, and pt does not have a diagnosis of ALS, Myasthenia Gravis or Lambert-Eaton Syndrome.  Risks and benefits of injections discussed and pt agrees to proceed with the procedure.  Written consent obtained  These injections are medically necessary. Pt  receives good benefits from these injections. These injections do not cause sedations or hallucinations  which the oral therapies may cause.  Description of procedure:  The patient was placed in a sitting position. The standard protocol was used for Botox  as follows, with 5 units of Botox  injected at each  site:   -Procerus muscle, midline injection  -Corrugator muscle, bilateral injection  -Frontalis muscle, bilateral injection, with 2 sites each side, medial injection was performed in the upper one third of the frontalis muscle, in the region vertical from the medial inferior edge of the superior orbital rim. The lateral injection was again in the upper one third of the forehead vertically above the lateral limbus of the cornea, 1.5 cm lateral to the medial injection site.  -Temporalis muscle injection, 4 sites, bilaterally. The first injection was 3 cm above the tragus of the ear, second injection site was 1.5 cm to 3 cm up from the first injection site in line with the tragus of the ear. The third injection site was 1.5-3 cm forward between the first 2 injection sites. The fourth injection site was 1.5 cm posterior to the second injection site.   -Occipitalis muscle injection, 3 sites, bilaterally. The first injection was done one half way between the occipital protuberance and the tip of the mastoid process behind the ear. The second injection site was done lateral and superior to the first, 1 fingerbreadth from the first injection. The third injection site was 1 fingerbreadth superiorly and medially from the first injection site.  -Cervical paraspinal muscle injection, 2 sites, bilateral knee first injection site was 1 cm from the midline of the cervical spine, 3 cm inferior to the lower border of the occipital protuberance. The second injection site was 1.5 cm superiorly and laterally to the first injection site.  -Trapezius muscle injection was performed at 3 sites, bilaterally. The first injection site was in the upper trapezius muscle halfway between the inflection point of the neck, and the acromion. The second injection site was one half way between the acromion and the first injection site. The third injection was done between the first injection site and the inflection point of the  neck.   Will return for repeat injection in 3 months.   155 units of Botox  was used, 45u Botox  not injected was wasted. The patient tolerated the procedure well, there were no complications of the above procedure.

## 2024-04-23 NOTE — Progress Notes (Unsigned)
 Botox - 200 units x 1 vial Lot: [id number] Expiration: 06/2026 NDC: 9976-6078-97   Bacteriostatic 0.9% Sodium Chloride - 4 mL  Lot: [id number] Expiration: 06/21/2025 NDC: 9590-8033-97   Dx: H56.290  S/P  Witnessed by Heather Boring, RN

## 2024-04-29 ENCOUNTER — Other Ambulatory Visit (HOSPITAL_COMMUNITY): Payer: Self-pay

## 2024-05-06 ENCOUNTER — Telehealth: Payer: Self-pay | Admitting: Pharmacist

## 2024-05-06 NOTE — Telephone Encounter (Signed)
 Pharmacy Patient Advocate Encounter   Received notification from CoverMyMeds that prior authorization for Ubrelvy  100MG  tablets is required/requested.   Insurance verification completed.   The patient is insured through [facility] .   Per test claim: PA required; PA submitted to above mentioned insurance via Latent Key/confirmation #/EOC [id number] Status is pending

## 2024-05-06 NOTE — Telephone Encounter (Signed)
 Pharmacy Patient Advocate Encounter  Received notification from [facility] that Prior Authorization for UBRELVY  100 MG PO TABS has been APPROVED from 05/06/2024 to 009/14/2026   PA #/Case ID/Reference #: [id number]

## 2024-05-21 ENCOUNTER — Other Ambulatory Visit: Payer: Self-pay | Admitting: Obstetrics and Gynecology

## 2024-05-21 ENCOUNTER — Other Ambulatory Visit: Payer: Self-pay

## 2024-05-21 ENCOUNTER — Other Ambulatory Visit (HOSPITAL_COMMUNITY): Payer: Self-pay

## 2024-05-21 MED ORDER — ESTRADIOL 0.1 MG/GM VA CREA
1.0000 | TOPICAL_CREAM | Freq: Every day | VAGINAL | 12 refills | Status: DC
  Filled 2024-05-21: qty 42.5, 90d supply, fill #0

## 2024-05-21 NOTE — Telephone Encounter (Signed)
 Med refill request:  estradiol  (estrace  vaginal) 0.1 mg/gm vaginal cream Disp: 42.5 g    Refills: 12 Last ordered:  05/08/23 Last dispensed:  04/23/24  Last AEX:  02/16/24 Next AEX:  02/19/25 Last MMG (if hormonal med):  12/06/23 Refill authorized? Please Advise.

## 2024-05-22 ENCOUNTER — Other Ambulatory Visit (HOSPITAL_COMMUNITY): Payer: Self-pay

## 2024-05-22 DIAGNOSIS — M5412 Radiculopathy, cervical region: Secondary | ICD-10-CM | POA: Diagnosis not present

## 2024-05-22 DIAGNOSIS — F1120 Opioid dependence, uncomplicated: Secondary | ICD-10-CM | POA: Diagnosis not present

## 2024-05-22 DIAGNOSIS — Q761 Klippel-Feil syndrome: Secondary | ICD-10-CM | POA: Diagnosis not present

## 2024-05-22 MED ORDER — OXYCODONE-ACETAMINOPHEN 5-325 MG PO TABS
1.0000 | ORAL_TABLET | Freq: Three times a day (TID) | ORAL | 0 refills | Status: DC | PRN
  Filled 2024-05-28: qty 84, 28d supply, fill #0

## 2024-05-22 MED ORDER — OXYCODONE-ACETAMINOPHEN 5-325 MG PO TABS
1.0000 | ORAL_TABLET | Freq: Three times a day (TID) | ORAL | 0 refills | Status: DC | PRN
  Filled 2024-07-23: qty 75, 25d supply, fill #0
  Filled 2024-07-23: qty 9, 3d supply, fill #0

## 2024-05-22 MED ORDER — OXYCODONE-ACETAMINOPHEN 5-325 MG PO TABS
1.0000 | ORAL_TABLET | Freq: Three times a day (TID) | ORAL | 0 refills | Status: DC | PRN
  Filled 2024-06-25: qty 84, 28d supply, fill #0

## 2024-05-27 ENCOUNTER — Other Ambulatory Visit: Payer: Self-pay

## 2024-05-27 ENCOUNTER — Other Ambulatory Visit (HOSPITAL_COMMUNITY): Payer: Self-pay

## 2024-05-28 ENCOUNTER — Other Ambulatory Visit (HOSPITAL_COMMUNITY): Payer: Self-pay

## 2024-05-29 ENCOUNTER — Other Ambulatory Visit (HOSPITAL_COMMUNITY): Payer: Self-pay

## 2024-05-31 ENCOUNTER — Telehealth: Payer: Self-pay | Admitting: Neurology

## 2024-05-31 NOTE — Telephone Encounter (Signed)
 LVM and sent mychart msg informing pt of need to reschedule 07/24/24 Botox  appointment - MD departure  I have 07/24/24 at 3:30pm with Amy held for patient if she reaches back out to us 

## 2024-06-05 ENCOUNTER — Other Ambulatory Visit (HOSPITAL_COMMUNITY): Payer: Self-pay

## 2024-06-18 ENCOUNTER — Other Ambulatory Visit: Payer: Self-pay

## 2024-06-19 NOTE — Telephone Encounter (Signed)
 Pt left me a VM returning call, I called her back and LVM with my direct #.

## 2024-06-20 ENCOUNTER — Other Ambulatory Visit: Payer: Self-pay | Admitting: Medical Genetics

## 2024-06-20 DIAGNOSIS — Z006 Encounter for examination for normal comparison and control in clinical research program: Secondary | ICD-10-CM

## 2024-06-24 ENCOUNTER — Other Ambulatory Visit (HOSPITAL_COMMUNITY): Payer: Self-pay

## 2024-06-24 ENCOUNTER — Encounter: Payer: Self-pay | Admitting: Radiology

## 2024-06-25 ENCOUNTER — Other Ambulatory Visit (HOSPITAL_COMMUNITY): Payer: Self-pay

## 2024-06-27 ENCOUNTER — Other Ambulatory Visit (HOSPITAL_COMMUNITY): Payer: Self-pay

## 2024-06-27 NOTE — Progress Notes (Deleted)
 Office Visit Note  Patient: Kelly [name]             Date of Birth: 1966-04-07           MRN: [id number]             PCP: Wendee Lynwood HERO, NP Referring: Wendee Lynwood HERO, NP Visit Date: 07/10/2024 Occupation: Data Unavailable  Subjective:  No chief complaint on file.   History of Present Illness: Kelly [name] is a 58 y.o. female ***     Activities of Daily Living:  Patient reports morning stiffness for *** {minute/hour:19697}.   Patient {ACTIONS;DENIES/REPORTS:21021675::Denies} nocturnal pain.  Difficulty dressing/grooming: {ACTIONS;DENIES/REPORTS:21021675::Denies} Difficulty climbing stairs: {ACTIONS;DENIES/REPORTS:21021675::Denies} Difficulty getting out of chair: {ACTIONS;DENIES/REPORTS:21021675::Denies} Difficulty using hands for taps, buttons, cutlery, and/or writing: {ACTIONS;DENIES/REPORTS:21021675::Denies}  No Rheumatology ROS completed.   PMFS History:  Patient Active Problem List   Diagnosis Date Noted   Cervical radiculopathy 02/19/2024   Right corneal abrasion 11/22/2023   Recurrent UTI 11/20/2023   Upper respiratory tract infection 11/09/2023   Ptosis of right eyelid 05/24/2022   Blurred vision 05/24/2022   Numbness and tingling in both hands 07/25/2020   Overweight 07/25/2020   Positive depression screening 07/25/2020   Stressful life event affecting family 07/12/2019   Caregiver burden 07/12/2019   Tobacco abuse counseling 07/12/2019   Protrusion of cervical intervertebral disc 04/10/2019   Spinal stenosis of cervical region 04/10/2019   Overactive bladder 11/19/2018   Photosensitization due to sun 11/02/2018   Generalized pain 11/02/2018   High risk medication use 11/02/2018   Plantar fasciitis 05/10/2018   Patellofemoral pain syndrome of both knees 05/10/2018   History of renal stone 04/17/2018   Urinary frequency 04/17/2018   Chronic migraine without aura without status migrainosus, not intractable 11/01/2016   Dizziness and giddiness  05/11/2016   Hemorrhoidal skin tag 04/07/2015   IUD (intrauterine device) in place 05/03/2013   Allergic rhinitis 01/11/2013   Current smoker 01/11/2013   Weight gain 06/26/2012   Cervical stenosis (uterine cervix) 04/25/2012   Anticardiolipin antibody positive    Hyperlipidemia    Hypertension    History of trichomoniasis 01/18/2011    Class: History of   THYROID  NODULE, RIGHT 02/05/2008   GERD 02/05/2008   NECK PAIN 02/05/2008    Past Medical History:  Diagnosis Date   Acute back pain 04/17/2018   Anal fistula    intersphincteric    Anemia    Anticardiolipin antibody positive    Cervicalgia    followed by dr alm molt   Community acquired pneumonia of left lower lobe of lung 07/06/2023   Coronary artery disease    Cystitis 06/19/2023   Decreased range of motion of finger of left hand 01/31/2018   Depression    Diarrhea 05/20/2016   Flank pain 04/17/2018   Generalized abdominal pain 04/17/2018   History of herpes genitalis    many yrs ago   History of hypertension 05/11/2016   History of kidney stones    History of migraine 06/10/2016   Hyperlipidemia    Hypertension    followed by pcp   (11-25-2019  per pt had stress test approx. 2018 w/ Dr Blanca, told normal)   Laceration of left hand 01/31/2018   Left knee pain 05/10/2018   Localized swelling on left hand 01/31/2018   Migraines    neurologist--- dr ines   OSA on CPAP    Pain in the chest 05/11/2016   TMJ (dislocation of temporomandibular joint)    11-25-2019  jaw pops, no appliance   Wears glasses     Family History  Problem Relation Age of Onset   Heart disease Mother 42       died suddenly of MI   Heart attack Mother    Diabetes Father    Hypertension Father    Cancer Father        colon cancer   Kidney Stones Father    Diabetes Sister    Stroke Sister    Heart disease Maternal Uncle 55       died of MI   Heart disease Maternal Uncle    Breast cancer Maternal Grandmother    Mental illness  Son    Healthy Daughter    Healthy Daughter    Sleep apnea Neg Hx    Past Surgical History:  Procedure Laterality Date   ANTERIOR CERVICAL DECOMP/DISCECTOMY FUSION  12/ 2020   @[facility]   C4 - 6   CESAREAN SECTION     X3   LAST ONE 11-30-2004   CYSTOSCOPY W/ RETROGRADES Right 10/29/2012   Procedure: CYSTOSCOPY WITH RETROGRADE PYELOGRAM and stent placement;  Surgeon: Glendia DELENA [name], MD;  Location: [facility];  Service: Urology;  Laterality: Right;  rt stent placement , rt retrograde and cysto    CYSTOSCOPY W/ URETERAL STENT PLACEMENT Right 11/23/2012   Procedure: CYSTOSCOPY WITH STENT REPLACEMENT;  Surgeon: Ricardo Likens, MD;  Location[facility];  Service: Urology;  Laterality: Right;   CYSTOSCOPY/RETROGRADE/URETEROSCOPY/STONE EXTRACTION WITH BASKET Right 11/23/2012   Procedure: CYSTOSCOPY/RETROGRADE/URETEROSCOPY/STONE EXTRACTION WITH BASKET;  Surgeon: Ricardo Likens, MD;  [facility];  Service: Urology;  Laterality: Right;   EVALUATION UNDER ANESTHESIA WITH ANAL FISSUROTOMY N/A 01/23/2020   Procedure: ANAL EXAM UNDER ANESTHESIA, FISTULOTOMY, SKIN TAG EXCISION;  Surgeon: Debby Hila, MD;  Location: [facility];  Service: General;  Laterality: N/A;   INTRAUTERINE DEVICE (IUD) INSERTION     mirena  inserted 07-11-18   SKIN CANCER DESTRUCTION     Social History   Tobacco Use   Smoking status: Former    Current packs/day: 0.00    Average packs/day: 0.5 packs/day for 15.0 years (7.5 ttl pk-yrs)    Types: E-cigarettes, Cigarettes    Start date: 08/22/2005    Quit date: 08/22/2020    Years since quitting: 3.8    Passive exposure: Never   Smokeless tobacco: Never   Tobacco comments:    vape  Vaping Use   Vaping status: Every Day  Substance Use Topics   Alcohol use: Not Currently   Drug use: Never   Social History   Social History Narrative   Lives at home w/ her children   Right-handed      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   Physical Exam   Musculoskeletal Exam: ***  CDAI Exam: CDAI Score: -- Patient Global: --; Provider Global: -- Swollen: --; Tender: -- Joint  Exam 07/10/2024   No joint exam has been documented for this visit   There is currently no information documented on the homunculus. Go to the Rheumatology activity and complete the homunculus joint exam.  Investigation: No additional findings.  Imaging: No results found.  Recent Labs: Lab Results  Component Value Date   WBC 8.7 06/09/2023   HGB 13.1 06/09/2023   PLT 293 06/09/2023   NA 142 05/10/2023   K 3.9 05/10/2023   CL 107 (H) 05/10/2023   CO2 25 05/10/2023   GLUCOSE 125 (H) 05/10/2023   BUN 15 05/10/2023   CREATININE 0.78 05/10/2023   BILITOT 0.6 04/11/2023   ALKPHOS 113 04/11/2023   AST 15 04/11/2023   ALT 14 04/11/2023   PROT 7.0 04/11/2023   ALBUMIN 4.6 04/11/2023   CALCIUM  9.2 05/10/2023   GFRAA 105 06/26/2020   QFTBGOLDPLUS Negative 11/02/2018    Speciality Comments: No specialty comments available.  Procedures:  No procedures performed Allergies: Imitrex [sumatriptan base], Zomig, Avelox [moxifloxacin hcl in nacl], Morphine, Septra [bactrim], Sulfamethoxazole, Sumatriptan, and Trimethoprim   Assessment / Plan:     Visit Diagnoses: No diagnosis found.  Orders: No orders of the defined types were placed in this  encounter.  No orders of the defined types were placed in this encounter.   Face-to-face time spent with patient was *** minutes. Greater than 50% of time was spent in counseling and coordination of care.  Follow-Up Instructions: No follow-ups on file.   Daved JAYSON Gavel, CMA  Note - This record has been created using Animal nutritionist.  Chart creation errors have been sought, but may not always  have been located. Such creation errors do not reflect on  the standard of medical care.

## 2024-06-28 ENCOUNTER — Other Ambulatory Visit (HOSPITAL_COMMUNITY): Payer: Self-pay

## 2024-07-01 ENCOUNTER — Other Ambulatory Visit: Payer: Self-pay

## 2024-07-09 ENCOUNTER — Telehealth: Admitting: Family Medicine

## 2024-07-09 VITALS — HR 103 | Temp 99.8°F | Ht 64.0 in | Wt 163.0 lb

## 2024-07-09 DIAGNOSIS — U071 COVID-19: Secondary | ICD-10-CM

## 2024-07-09 MED ORDER — NIRMATRELVIR/RITONAVIR (PAXLOVID)TABLET: 3.0000 | ORAL_TABLET | Freq: Two times a day (BID) | ORAL | 0 refills | Status: AC

## 2024-07-09 NOTE — Progress Notes (Signed)
   Subjective:    Patient ID: Kelly [name], female    DOB: 06-12-1966, 58 y.o.   MRN: [id number]  HPI Documentation for virtual audio and video telecommunications through Caregility encounter:  The patient was located at home. 2 patient identifiers used.  The provider was located in the office. The patient did consent to this visit and is aware of possible charges through their insurance for this visit.  The other persons participating in this telemedicine service were none. Time spent on call was 5 minutes and in review of previous records 15 minutes total for counseling and coordination of care.  This virtual service is not related to other E/M service within previous 7 days.  She states that on Sunday she developed myalgias, fever and sore throat followed by coughing some slight shortness of breath and earache.  She tested negative yesterday but positive today for COVID.  She has been checking her pulse ox and it is in the 98 range. She continues on DayQuil and NyQuil but is also taking 800 mg 3 times daily of Advil  and 2 Tylenol  4 times per day to help with her fever aches and pains.  Review of Systems     Objective:    Physical Exam Alert and slightly toxic appearing.  Her breathing pattern appears normal       Assessment & Plan:  COVID-19 - Plan: nirmatrelvir /ritonavir  (PAXLOVID ) 20 x 150 MG & 10 x 100MG  TABS Since she seems to be a little more toxic than I would like to see, I will add Paxlovid  to her regimen.  She is to leave a message on MyChart on a daily basis.  She is to continue on Advil  and Aleve as well as NyQuil and DayQuil.  She will keep track of her pulse ox.  Discussed returning to work after she is fever free for at least 24 hours without antipyretic and will wear a mask.

## 2024-07-10 ENCOUNTER — Other Ambulatory Visit: Payer: Self-pay | Admitting: Neurology

## 2024-07-10 ENCOUNTER — Other Ambulatory Visit (HOSPITAL_COMMUNITY): Payer: Self-pay

## 2024-07-10 ENCOUNTER — Ambulatory Visit: Payer: Commercial Managed Care - PPO | Admitting: Rheumatology

## 2024-07-10 DIAGNOSIS — Z820 Family history of epilepsy and other diseases of the nervous system: Secondary | ICD-10-CM

## 2024-07-10 DIAGNOSIS — R7689 Other specified abnormal immunological findings in serum: Secondary | ICD-10-CM

## 2024-07-10 DIAGNOSIS — M722 Plantar fascial fibromatosis: Secondary | ICD-10-CM

## 2024-07-10 DIAGNOSIS — M5030 Other cervical disc degeneration, unspecified cervical region: Secondary | ICD-10-CM

## 2024-07-10 DIAGNOSIS — Z87442 Personal history of urinary calculi: Secondary | ICD-10-CM

## 2024-07-10 DIAGNOSIS — M19041 Primary osteoarthritis, right hand: Secondary | ICD-10-CM

## 2024-07-10 DIAGNOSIS — E559 Vitamin D deficiency, unspecified: Secondary | ICD-10-CM

## 2024-07-10 DIAGNOSIS — N3281 Overactive bladder: Secondary | ICD-10-CM

## 2024-07-10 DIAGNOSIS — Z975 Presence of (intrauterine) contraceptive device: Secondary | ICD-10-CM

## 2024-07-10 DIAGNOSIS — Z8639 Personal history of other endocrine, nutritional and metabolic disease: Secondary | ICD-10-CM

## 2024-07-10 DIAGNOSIS — R5383 Other fatigue: Secondary | ICD-10-CM

## 2024-07-10 DIAGNOSIS — L659 Nonscarring hair loss, unspecified: Secondary | ICD-10-CM

## 2024-07-10 DIAGNOSIS — Z87891 Personal history of nicotine dependence: Secondary | ICD-10-CM

## 2024-07-10 DIAGNOSIS — L568 Other specified acute skin changes due to ultraviolet radiation: Secondary | ICD-10-CM

## 2024-07-10 DIAGNOSIS — M7061 Trochanteric bursitis, right hip: Secondary | ICD-10-CM

## 2024-07-10 DIAGNOSIS — M4802 Spinal stenosis, cervical region: Secondary | ICD-10-CM

## 2024-07-10 DIAGNOSIS — M222X1 Patellofemoral disorders, right knee: Secondary | ICD-10-CM

## 2024-07-10 DIAGNOSIS — G43709 Chronic migraine without aura, not intractable, without status migrainosus: Secondary | ICD-10-CM

## 2024-07-10 DIAGNOSIS — I10 Essential (primary) hypertension: Secondary | ICD-10-CM

## 2024-07-10 DIAGNOSIS — I7300 Raynaud's syndrome without gangrene: Secondary | ICD-10-CM

## 2024-07-10 DIAGNOSIS — M791 Myalgia, unspecified site: Secondary | ICD-10-CM

## 2024-07-10 DIAGNOSIS — R682 Dry mouth, unspecified: Secondary | ICD-10-CM

## 2024-07-10 DIAGNOSIS — K219 Gastro-esophageal reflux disease without esophagitis: Secondary | ICD-10-CM

## 2024-07-11 ENCOUNTER — Other Ambulatory Visit (HOSPITAL_COMMUNITY): Payer: Self-pay

## 2024-07-11 ENCOUNTER — Telehealth: Payer: Self-pay | Admitting: Family Medicine

## 2024-07-11 DIAGNOSIS — G4733 Obstructive sleep apnea (adult) (pediatric): Secondary | ICD-10-CM | POA: Diagnosis not present

## 2024-07-11 MED ORDER — UBRELVY 100 MG PO TABS
1.0000 | ORAL_TABLET | Freq: Every day | ORAL | 2 refills | Status: AC | PRN
  Filled 2024-07-11: qty 16, 30d supply, fill #0
  Filled 2024-08-09: qty 16, 30d supply, fill #1
  Filled 2024-09-17: qty 16, 30d supply, fill #2

## 2024-07-11 NOTE — Telephone Encounter (Signed)
Advised pt of same. 

## 2024-07-11 NOTE — Telephone Encounter (Signed)
 Patient advises fever is down to 99 today.  She now has a rash that started last night on both arms, legs, neck and chest.  It is itching.  Please advise

## 2024-07-15 ENCOUNTER — Ambulatory Visit (INDEPENDENT_AMBULATORY_CARE_PROVIDER_SITE_OTHER): Admitting: Medical

## 2024-07-15 ENCOUNTER — Encounter: Payer: Self-pay | Admitting: Medical

## 2024-07-15 ENCOUNTER — Other Ambulatory Visit (HOSPITAL_COMMUNITY): Payer: Self-pay

## 2024-07-15 ENCOUNTER — Other Ambulatory Visit: Payer: Self-pay

## 2024-07-15 VITALS — BP 126/72 | HR 91 | Temp 99.5°F | Ht 64.0 in | Wt 165.2 lb

## 2024-07-15 DIAGNOSIS — U071 COVID-19: Secondary | ICD-10-CM | POA: Diagnosis not present

## 2024-07-15 DIAGNOSIS — R058 Other specified cough: Secondary | ICD-10-CM | POA: Diagnosis not present

## 2024-07-15 DIAGNOSIS — J069 Acute upper respiratory infection, unspecified: Secondary | ICD-10-CM

## 2024-07-15 LAB — CBC WITH DIFFERENTIAL/PLATELET
Basophils Absolute: 0 x10E3/uL (ref 0.0–0.2)
Basos: 1 %
EOS (ABSOLUTE): 0.2 x10E3/uL (ref 0.0–0.4)
Eos: 3 %
Hematocrit: 36.1 % (ref 34.0–46.6)
Hemoglobin: 12.4 g/dL (ref 11.1–15.9)
Immature Grans (Abs): 0 x10E3/uL (ref 0.0–0.1)
Immature Granulocytes: 0 %
Lymphocytes Absolute: 2.4 x10E3/uL (ref 0.7–3.1)
Lymphs: 34 %
MCH: 31.2 pg (ref 26.6–33.0)
MCHC: 34.3 g/dL (ref 31.5–35.7)
MCV: 91 fL (ref 79–97)
Monocytes Absolute: 0.5 x10E3/uL (ref 0.1–0.9)
Monocytes: 7 %
Neutrophils Absolute: 3.9 x10E3/uL (ref 1.4–7.0)
Neutrophils: 54 %
Platelets: 306 x10E3/uL (ref 150–450)
RBC: 3.98 x10E6/uL (ref 3.77–5.28)
RDW: 12.1 % (ref 11.7–15.4)
WBC: 7 x10E3/uL (ref 3.4–10.8)

## 2024-07-15 MED ORDER — PROMETHAZINE-DM 6.25-15 MG/5ML PO SYRP
5.0000 mL | ORAL_SOLUTION | Freq: Four times a day (QID) | ORAL | 0 refills | Status: DC | PRN
  Filled 2024-07-15: qty 120, 6d supply, fill #0

## 2024-07-15 MED ORDER — AIRSUPRA 90-80 MCG/ACT IN AERO
2.0000 | INHALATION_SPRAY | Freq: Four times a day (QID) | RESPIRATORY_TRACT | 0 refills | Status: DC | PRN
  Filled 2024-07-15: qty 10.7, 15d supply, fill #0

## 2024-07-15 MED ORDER — AZITHROMYCIN 250 MG PO TABS
ORAL_TABLET | ORAL | 0 refills | Status: AC
  Filled 2024-07-15: qty 6, 5d supply, fill #0

## 2024-07-15 NOTE — Progress Notes (Signed)
 Specialty Pharmacy Refill Coordination Note  Kelly [name] is a 58 y.o. female assessed today regarding refills of clinic administered specialty medication(s) OnabotulinumtoxinA  (Botox )   Clinic requested Courier to Provider Office   Delivery date: 07/16/24   Verified address: [facility] [address]   Medication will be filled on: 07/15/24   Copay: $0.00 Appointment: 12.03.25

## 2024-07-15 NOTE — Progress Notes (Signed)
 Subjective:  Kelly [name] is a 58 y.o. female who presents for Chief Complaint  Patient presents with   other    Covid Day 8 still has fever and concern of infection-     Here for illness. Started 8 days with sudden onset of body aches, not feeling, bad sore throat.  Sore throat resolved in 2 days.  Body aches have continued.  Headache has continued bad.  Has some intermittent cough.  Some sneezing, some runny nose.  Is coughing up some brown phlegm.  Cough worsens the headaches.   Has some sob.  Fever has persisted, was higher but remaining low grade at this time.  100.1 the last few days.  Last week 101 highest.   Has been using tylenol  all week.  Having some left shoulder plan discomfort.   Has had nausea on and off.  No vomiting.  Using dayquil and ibuprofen , some tylenol  . Has not been using her inhaler.  No other aggravating or relieving factors.    No other c/o.  Past Medical History:  Diagnosis Date   Acute back pain 04/17/2018   Anal fistula    intersphincteric    Anemia    Anticardiolipin antibody positive    Cervicalgia    followed by dr alm molt   Community acquired pneumonia of left lower lobe of lung 07/06/2023   Coronary artery disease    Cystitis 06/19/2023   Decreased range of motion of finger of left hand 01/31/2018   Depression    Diarrhea 05/20/2016   Flank pain 04/17/2018   Generalized abdominal pain 04/17/2018   History of herpes genitalis    many yrs ago   History of hypertension 05/11/2016   History of kidney stones    History of migraine 06/10/2016   Hyperlipidemia    Hypertension    followed by pcp   (11-25-2019  per pt had stress test approx. 2018 w/ Dr Blanca, told normal)   Laceration of left hand 01/31/2018   Left knee pain 05/10/2018   Localized swelling on left hand 01/31/2018   Migraines    neurologist--- dr ines   OSA on CPAP    Pain in the chest 05/11/2016   TMJ (dislocation of temporomandibular joint)    11-25-2019  jaw pops, no  appliance   Wears glasses    Current Outpatient Medications on File Prior to Visit  Medication Sig Dispense Refill   acyclovir  ointment (ZOVIRAX ) 5 % Apply 1 Application topically every 3 (three) hour for 5-7 days 30 g 2   amLODipine  (NORVASC ) 2.5 MG tablet Take 1 tablet (2.5 mg total) by mouth daily. 90 tablet 3   aspirin  EC 81 MG tablet Take 1 tablet (81 mg total) by mouth daily. Swallow whole. 90 tablet 3   atorvastatin  (LIPITOR) 10 MG tablet Take 1 tablet (10 mg total) by mouth daily. 90 tablet 3   botulinum toxin Type A  (BOTOX ) 200 units injection Provider to inject 155 units into the muscles of the head and neck every 12 weeks. Discard remainder. 1 each 3   cetirizine (ZYRTEC ALLERGY) 10 MG tablet 1 tablet every day by oral route.     cyclobenzaprine  (FLEXERIL ) 5 MG tablet Take 1 tablet (5 mg total) by mouth 3 (three) times daily as needed for muscle spasms. 60 tablet 11   estradiol  (ESTRACE  VAGINAL) 0.1 MG/GM vaginal cream Apply to vagina at bedtime three times a week. 42.5 g 12   Evolocumab  (REPATHA  SURECLICK) 140 MG/ML SOAJ Inject 140  mg into the skin every 14 (fourteen) days. 6 mL 3   fluconazole  (DIFLUCAN ) 150 MG tablet Take 1 tablet as directed. May repeat in 3 days if symptoms not resolved (Patient taking differently: Take 150 mg by mouth as needed.) 2 tablet 2   fluocinonide  cream (LIDEX ) 0.05 % Apply topically to affected areas on body 2 (two) times daily for 1-2 weeks as needed for flares of rash 60 g 1   fluorouracil  (EFUDEX ) 5 % cream Apply 1 Application topically 2 (two) times daily for up to 3 weeks as tolerated. 40 g 0   ibuprofen  (ADVIL ) 800 MG tablet Take 1 tablet (800 mg total) by mouth every 8 (eight) hours as needed for fever, headache or moderate pain (pain score 4-6). 60 tablet 1   icosapent Ethyl (VASCEPA) 1 g capsule Take 1 capsule (1 g total) by mouth 2 (two) times daily.     ketorolac  (ACULAR ) 0.5 % ophthalmic solution Apply 1 drop in the right eye for 3-4 days for  pain. 5 mL 0   levonorgestrel  (MIRENA ) 20 MCG/24HR IUD 1 each by Intrauterine route once.     Multiple Vitamin (MULTIVITAMIN WITH MINERALS) TABS Take 1 tablet by mouth daily.     omeprazole  (PRILOSEC) 40 MG capsule Take 1 capsule (40 mg total) by mouth daily. 30 capsule 1   ondansetron  (ZOFRAN -ODT) 4 MG disintegrating tablet Dissolve 1 tablet (4 mg total) by mouth every 8 (eight) hours as needed for nausea or vomiting. 20 tablet 0   oxyCODONE -acetaminophen  (PERCOCET/ROXICET) 5-325 MG tablet Take 1 tablet by mouth every 8 (eight) hours as needed for pain, 28 day supply. 84 tablet 0   oxyCODONE -acetaminophen  (PERCOCET/ROXICET) 5-325 MG tablet Take 1 tablet by mouth every 8 (eight) hours as needed for pain, 28 day supply. 84 tablet 0   oxyCODONE -acetaminophen  (PERCOCET/ROXICET) 5-325 MG tablet Take 1 tablet by mouth every 8 (eight) hours as needed, 28 day supply. 84 tablet 0   oxyCODONE -acetaminophen  (PERCOCET/ROXICET) 5-325 MG tablet Take 1 tablet by mouth every 8 (eight) hours as needed for pain. 84 tablet 0   oxyCODONE -acetaminophen  (PERCOCET/ROXICET) 5-325 MG tablet Take 1 tablet by mouth every 8 (eight) hours as needed for pain. 84 tablet 0   oxyCODONE -acetaminophen  (PERCOCET/ROXICET) 5-325 MG tablet Take 1 tablet by mouth every 8 (eight) hours as needed for pain. 84 tablet 0   oxyCODONE -acetaminophen  (PERCOCET/ROXICET) 5-325 MG tablet Take 1 tablet by mouth every 8 (eight) hours as needed for pain. 84 tablet 0   [START ON 07/23/2024] oxyCODONE -acetaminophen  (PERCOCET/ROXICET) 5-325 MG tablet Take 1 tablet by mouth every 8 (eight) hours as needed for pain. 84 tablet 0   oxyCODONE -acetaminophen  (PERCOCET/ROXICET) 5-325 MG tablet Take 1 tablet by mouth every 8 (eight) hours as needed for pain. 84 tablet 0   pregabalin  (LYRICA ) 100 MG capsule Take 1 capsule (100 mg total) by mouth 2 (two) times daily. 180 capsule 1   topiramate  (TOPAMAX ) 50 MG tablet Take 1 tablet (50 mg total) by mouth 2 (two) times  daily. 180 tablet 3   triamcinolone  cream (KENALOG ) 0.1 % Apply 1 Application topically 2 (two) times daily. 45 g 0   Ubrogepant  (UBRELVY ) 100 MG TABS Take 1 tablet (100 mg total) by mouth daily as needed. 16 tablet 2   valACYclovir  (VALTREX ) 1000 MG tablet Take 1 tablet (1,000 mg total) by mouth daily. 90 tablet 3   amitriptyline  (ELAVIL ) 50 MG tablet 1 tab(s) orally once a day (at bedtime); Duration: 90 days (Patient not taking: Reported  on 07/15/2024)     pregabalin  (LYRICA ) 100 MG capsule Take 1 capsule (100 mg total) by mouth 2 (two) times daily. (Patient not taking: Reported on 07/15/2024) 180 capsule 1   Pseudoephedrine-APAP-DM (DAYQUIL PO) Take 2 capsules by mouth as needed. (Patient not taking: Reported on 07/15/2024)     rifaximin  (XIFAXAN ) 550 MG TABS tablet 1 tab(s) orally 3 times a day; Duration: 14 days (Patient not taking: Reported on 07/15/2024)     No current facility-administered medications on file prior to visit.     The following portions of the patient's history were reviewed and updated as appropriate: allergies, current medications, past family history, past medical history, past social history, past surgical history and problem list.  ROS Otherwise as in subjective above    Objective: BP 126/72   Pulse 91   Temp 99.5 F (37.5 C)   Ht 5' 4 (1.626 m)   Wt 165 lb 3.2 oz (74.9 kg)   SpO2 98%   BMI 28.36 kg/m   Wt Readings from Last 3 Encounters:  07/15/24 165 lb 3.2 oz (74.9 kg)  07/09/24 163 lb (73.9 kg)  02/16/24 162 lb 6.4 oz (73.7 kg)   General appearance: alert, no distress, well developed, well nourished, somewhat ill appearing HEENT: normocephalic, sclerae anicteric, conjunctiva pink and moist, TMs pearly, nares patent, no discharge or erythema, pharynx with mild erythema Oral cavity: MMM, no lesions Neck: supple, no lymphadenopathy, no thyromegaly, no masses Heart: RRR, normal S1, S2, no murmurs Lungs: somewhat decreased sounds, otherwise no  wheezes, rhonchi, or rales Ext: no edema    Assessment: Encounter Diagnoses  Name Primary?   Upper respiratory tract infection, unspecified type    COVID-19 virus infection Yes   Productive cough      Plan: 8 days into COVID-19 infection with acute upper respiratory symptoms and cough Persistent symptoms include headache, cough with brown sputum, and shortness of breath. Differential includes secondary bacterial infection. Discussed potential secondary pneumonia and antibiotic role. Considered inhaler for symptom relief. Discussed chest x-ray and CBC for secondary infection evaluation. Emphasized hydration and vitamin intake. - Ordered CBC to evaluate white blood cell count. - Ordered chest x-ray to assess for secondary pneumonia. - Prescribed Z-Pak (azithromycin ) for potential secondary bacterial infection. - Prescribed Airsuppra for symptom management - Advised use of inhaler twice daily, especially at bedtime, for shortness of breath. - Recommended increased fluid intake and vitamin supplementation (vitamin C, zinc, vitamin D ). - Advised wearing a mask when around others to prevent spread. - Promethazine  DM for cough management.   Lizania was seen today for other.  Diagnoses and all orders for this visit:  COVID-19 virus infection -     CBC with Differential/Platelet -     DG Chest 2 View; Future  Upper respiratory tract infection, unspecified type -     CBC with Differential/Platelet -     DG Chest 2 View; Future -     Albuterol -Budesonide  (AIRSUPRA ) 90-80 MCG/ACT AERO; Inhale 2 Inhalations into the lungs every 6 (six) hours as needed.  Productive cough -     CBC with Differential/Platelet -     DG Chest 2 View; Future  Other orders -     azithromycin  (ZITHROMAX ) 250 MG tablet; 2 tablets day 1, then 1 tablet days 2-4 -     promethazine -dextromethorphan (PROMETHAZINE -DM) 6.25-15 MG/5ML syrup; Take 5 mLs by mouth 4 (four) times daily as needed for cough.    Follow  up: pending labs

## 2024-07-16 ENCOUNTER — Ambulatory Visit: Payer: Self-pay | Admitting: Medical

## 2024-07-16 NOTE — Progress Notes (Signed)
 Results through MyChart

## 2024-07-22 ENCOUNTER — Other Ambulatory Visit (HOSPITAL_COMMUNITY): Payer: Self-pay

## 2024-07-22 NOTE — Telephone Encounter (Signed)
 I received forms for short term disability with her recent covid.  What days did she miss?

## 2024-07-23 ENCOUNTER — Other Ambulatory Visit: Payer: Self-pay

## 2024-07-23 ENCOUNTER — Other Ambulatory Visit (HOSPITAL_COMMUNITY): Payer: Self-pay

## 2024-07-24 ENCOUNTER — Ambulatory Visit: Admitting: Family Medicine

## 2024-07-24 ENCOUNTER — Other Ambulatory Visit (HOSPITAL_COMMUNITY): Payer: Self-pay

## 2024-07-24 ENCOUNTER — Ambulatory Visit: Admitting: Neurology

## 2024-07-24 ENCOUNTER — Other Ambulatory Visit: Payer: Self-pay

## 2024-07-24 VITALS — BP 136/89 | HR 87

## 2024-07-24 DIAGNOSIS — G43709 Chronic migraine without aura, not intractable, without status migrainosus: Secondary | ICD-10-CM

## 2024-07-24 MED ORDER — ONABOTULINUMTOXINA 200 UNITS IJ SOLR
155.0000 [IU] | Freq: Once | INTRAMUSCULAR | Status: AC
  Administered 2024-07-24: 155 [IU] via INTRAMUSCULAR

## 2024-07-24 NOTE — Progress Notes (Signed)
 Botox - 200 units x 1 vial Lot: [id number] Expiration: 10/2026 NDC: 9976-6078-97   Bacteriostatic 0.9% Sodium Chloride - 4 mL  Lot: [id number] Expiration: 01/2025 NDC: 9590-8033-97   Dx: H56.290  S/P  Witnessed by Diandra D

## 2024-07-24 NOTE — Progress Notes (Signed)
 07/24/2024 ALL: Kelly [name] returns for Botox . She was last seen by me last in 2023 and has had recent procedures with Dr Ines. She continues topiramate  50mg  BID and Ubrelvy  as needed. Migraines have been worse following Covid infection 2.5 weeks ago.   11/08/2021 ALL: Kelly [name] returns for Botox . She continues topiramate  50mg  BID and Ubrelvy  PRN. She continues to do well. She may have 4-5 migraines per month.   She was seen initially by Dr Margaret in 2017 then transferred to Dr Ines in 2018 to start Botox  therapy. In the interim, she was evaluated for sleep apnea by Dr Buck. She was started on CPAP following sleep study and has continued therapy since. She has not had a formal follow up for CPAP therapy since original study. I did review her CPAP compliance report in 07/2021 at last Botox  visit, however, she did not wish to continue therapy and asked to see Dr Buck in follow up to discuss management of OSA with [facility]. Appt was scheduled in 10/2021 but needed to be rescheduled due to Dr Buck being out of the office. She has been rescheduled with Dr Buck in 12/2021. She continues to fight with CPAP nightly. She does not feel she is resting well due to having to fight with the tubing. She will discuss with Dr Buck at upcoming appt.   08/09/2021 ALL: Kelly [name] returns for Botox . She does report improvement in migraine intensity and frequency since last visit. She is no longer going through full prescription of Ubrelvy . Stress levels have decreased. She continues topiramate  50mg  BID. She continues CPAP nightly but reports fighting with her machine. She does note benefit of using CPAP but does not like fighting with mask and tubing. She is very interested in [place]. We have discussed this device in the office today and she wishes to pursue additional workup with Dr Buck. We will get her scheduled as she will probably need repeat sleep study. CPAP started 2017.   05/10/2021 ALL: Kelly [name] returns for Botox . She is now  receiving 4th round. Unfortunately, she did not note any improvement with last round of Botox . She reports headaches are back to near daily frequency. She is using a full prescription of Ubrelvy  monthly and OTC analgesics regularly. She can not correlate worsening with any specific triggers. She has not continued Ajovy . She does continue topiramate  50mg  BID. May consider restarting Ajovy  in 1-2 weeks if headaches do not respond to this Botox  procedure. She continues CPAP therapy. She has not been seen for office follow up in the past year. She will schedule visit with me.   02/10/2021 ALL: She has noted improvement in migraine intensity and frequency. This is her 3rd procedure. She was having daily headaches and now having milder migraines about 4-5 times per month. Usually worse at end of Botox  cycle. Ubrelvy   100mg  usually works for abortive therapy. She continues Ajovy .   11/10/2020 ALL: She returns for 2nd Botox  procedure. She does note some improvement in the intensity of migraines. She feels that frequency is unchanged. She is using a full prescription of Nurtec every month. She continues Ajovy  and topiramate .   08/06/2020 ALL: This is her first Botox  procedure since 08/2018. She has near daily headaches with > 15 migrainous days. She is taking topiramate  and Ajovy . Nurtec helps some with abortive therapy. She does have trouble with clenching.    Consent Form Botulism Toxin Injection For Chronic Migraine  Reviewed orally with patient, additionally signature is on file:  Botulism toxin has  been approved by the Federal drug administration for treatment of chronic migraine. Botulism toxin does not cure chronic migraine and it may not be effective in some patients.  The administration of botulism toxin is accomplished by injecting a small amount of toxin into the muscles of the neck and head. Dosage must be titrated for each individual. Any benefits resulting from botulism toxin tend to wear off after  3 months with a repeat injection required if benefit is to be maintained. Injections are usually done every 3-4 months with maximum effect peak achieved by about 2 or 3 weeks. Botulism toxin is expensive and you should be sure of what costs you will incur resulting from the injection.  The side effects of botulism toxin use for chronic migraine may include:   -Transient, and usually mild, facial weakness with facial injections  -Transient, and usually mild, head or neck weakness with head/neck injections  -Reduction or loss of forehead facial animation due to forehead muscle weakness  -Eyelid drooping  -Dry eye  -Pain at the site of injection or bruising at the site of injection  -Double vision  -Potential unknown long term risks  Contraindications: You should not have Botox  if you are pregnant, nursing, allergic to albumin, have an infection, skin condition, or muscle weakness at the site of the injection, or have myasthenia gravis, Lambert-Eaton syndrome, or ALS.  It is also possible that as with any injection, there may be an allergic reaction or no effect from the medication. Reduced effectiveness after repeated injections is sometimes seen and rarely infection at the injection site may occur. All care will be taken to prevent these side effects. If therapy is given over a long time, atrophy and wasting in the muscle injected may occur. Occasionally the patient's become refractory to treatment because they develop antibodies to the toxin. In this event, therapy needs to be modified.  I have read the above information and consent to the administration of botulism toxin.   BOTOX  PROCEDURE NOTE FOR MIGRAINE HEADACHE  Contraindications and precautions discussed with patient(above). Aseptic procedure was observed and patient tolerated procedure. Procedure performed by Greig Forbes, FNP-C.   The condition has existed for more than 6 months, and pt does not have a diagnosis of ALS, Myasthenia  Gravis or Lambert-Eaton Syndrome.  Risks and benefits of injections discussed and pt agrees to proceed with the procedure.  Written consent obtained  These injections are medically necessary. Pt  receives good benefits from these injections. These injections do not cause sedations or hallucinations which the oral therapies may cause.   Description of procedure:  The patient was placed in a sitting position. The standard protocol was used for Botox  as follows, with 5 units of Botox  injected at each site:  -Procerus muscle, midline injection  -Corrugator muscle, bilateral injection  -Frontalis muscle, bilateral injection, with 2 sites each side, medial injection was performed in the upper one third of the frontalis muscle, in the region vertical from the medial inferior edge of the superior orbital rim. The lateral injection was again in the upper one third of the forehead vertically above the lateral limbus of the cornea, 1.5 cm lateral to the medial injection site.  -Temporalis muscle injection, 4 sites, bilaterally. The first injection was 3 cm above the tragus of the ear, second injection site was 1.5 cm to 3 cm up from the first injection site in line with the tragus of the ear. The third injection site was 1.5-3 cm forward between the  first 2 injection sites. The fourth injection site was 1.5 cm posterior to the second injection site. 5th site laterally in the temporalis  muscleat the level of the outer canthus.  -Occipitalis muscle injection, 3 sites, bilaterally. The first injection was done one half way between the occipital protuberance and the tip of the mastoid process behind the ear. The second injection site was done lateral and superior to the first, 1 fingerbreadth from the first injection. The third injection site was 1 fingerbreadth superiorly and medially from the first injection site.  -Cervical paraspinal muscle injection, 2 sites, bilaterally. The first injection site was 1 cm  from the midline of the cervical spine, 3 cm inferior to the lower border of the occipital protuberance. The second injection site was 1.5 cm superiorly and laterally to the first injection site.  -Trapezius muscle injection was performed at 3 sites, bilaterally. The first injection site was in the upper trapezius muscle halfway between the inflection point of the neck, and the acromion. The second injection site was one half way between the acromion and the first injection site. The third injection was done between the first injection site and the inflection point of the neck.   Will return for repeat injection in 3 months.   A total of 200 units of Botox  was prepared, 155 units of Botox  was injected as documented above, 45 units was wasted. The patient tolerated the procedure well, there were no complications of the above procedure.

## 2024-07-29 ENCOUNTER — Other Ambulatory Visit: Payer: Self-pay

## 2024-07-30 ENCOUNTER — Other Ambulatory Visit (HOSPITAL_COMMUNITY): Payer: Self-pay

## 2024-07-30 DIAGNOSIS — D485 Neoplasm of uncertain behavior of skin: Secondary | ICD-10-CM | POA: Diagnosis not present

## 2024-07-30 DIAGNOSIS — D1801 Hemangioma of skin and subcutaneous tissue: Secondary | ICD-10-CM | POA: Diagnosis not present

## 2024-07-30 DIAGNOSIS — L814 Other melanin hyperpigmentation: Secondary | ICD-10-CM | POA: Diagnosis not present

## 2024-07-30 DIAGNOSIS — Z85828 Personal history of other malignant neoplasm of skin: Secondary | ICD-10-CM | POA: Diagnosis not present

## 2024-07-30 DIAGNOSIS — L821 Other seborrheic keratosis: Secondary | ICD-10-CM | POA: Diagnosis not present

## 2024-07-30 DIAGNOSIS — Z08 Encounter for follow-up examination after completed treatment for malignant neoplasm: Secondary | ICD-10-CM | POA: Diagnosis not present

## 2024-07-30 DIAGNOSIS — L2089 Other atopic dermatitis: Secondary | ICD-10-CM | POA: Diagnosis not present

## 2024-07-30 MED ORDER — MOMETASONE FUROATE 0.1 % EX CREA
1.0000 | TOPICAL_CREAM | Freq: Every day | CUTANEOUS | 8 refills | Status: AC
  Filled 2024-07-30: qty 15, 30d supply, fill #0
  Filled 2024-08-19: qty 15, 30d supply, fill #1

## 2024-07-30 MED ORDER — CLOBETASOL PROPIONATE 0.05 % EX SOLN
1.0000 | Freq: Every day | CUTANEOUS | 8 refills | Status: AC
  Filled 2024-07-30: qty 50, 50d supply, fill #0

## 2024-07-31 ENCOUNTER — Other Ambulatory Visit (HOSPITAL_BASED_OUTPATIENT_CLINIC_OR_DEPARTMENT_OTHER): Payer: Self-pay

## 2024-08-01 ENCOUNTER — Other Ambulatory Visit: Payer: Self-pay

## 2024-08-13 ENCOUNTER — Other Ambulatory Visit (HOSPITAL_COMMUNITY): Payer: Self-pay

## 2024-08-19 ENCOUNTER — Other Ambulatory Visit (HOSPITAL_COMMUNITY): Payer: Self-pay

## 2024-08-19 ENCOUNTER — Other Ambulatory Visit: Payer: Self-pay | Admitting: Cardiovascular Disease

## 2024-08-19 ENCOUNTER — Other Ambulatory Visit: Payer: Self-pay

## 2024-08-20 ENCOUNTER — Other Ambulatory Visit (HOSPITAL_COMMUNITY): Payer: Self-pay

## 2024-08-20 ENCOUNTER — Other Ambulatory Visit: Payer: Self-pay

## 2024-08-20 DIAGNOSIS — Q761 Klippel-Feil syndrome: Secondary | ICD-10-CM | POA: Diagnosis not present

## 2024-08-20 DIAGNOSIS — F1120 Opioid dependence, uncomplicated: Secondary | ICD-10-CM | POA: Diagnosis not present

## 2024-08-20 MED ORDER — OXYCODONE-ACETAMINOPHEN 5-325 MG PO TABS
1.0000 | ORAL_TABLET | Freq: Four times a day (QID) | ORAL | 0 refills | Status: DC | PRN
  Filled 2024-08-20: qty 112, 28d supply, fill #0

## 2024-08-20 MED ORDER — ATORVASTATIN CALCIUM 10 MG PO TABS
10.0000 mg | ORAL_TABLET | Freq: Every day | ORAL | 3 refills | Status: AC
  Filled 2024-08-20: qty 90, 90d supply, fill #0

## 2024-08-20 MED ORDER — AMLODIPINE BESYLATE 2.5 MG PO TABS
2.5000 mg | ORAL_TABLET | Freq: Every day | ORAL | 3 refills | Status: AC
  Filled 2024-08-20: qty 90, 90d supply, fill #0

## 2024-08-20 MED ORDER — PREGABALIN 100 MG PO CAPS: 100.0000 mg | ORAL_CAPSULE | Freq: Two times a day (BID) | ORAL | 1 refills | Status: AC

## 2024-08-21 ENCOUNTER — Encounter (HOSPITAL_COMMUNITY): Payer: Self-pay

## 2024-08-21 ENCOUNTER — Other Ambulatory Visit: Payer: Self-pay | Admitting: Obstetrics and Gynecology

## 2024-08-21 ENCOUNTER — Other Ambulatory Visit (HOSPITAL_COMMUNITY): Payer: Self-pay

## 2024-08-21 MED ORDER — ESTRADIOL 0.01 % VA CREA
1.0000 g | TOPICAL_CREAM | VAGINAL | 0 refills | Status: AC
  Filled 2024-08-21: qty 42.5, 7d supply, fill #0

## 2024-08-21 NOTE — Telephone Encounter (Signed)
 Med refill request: estradiol  (ESTRACE  VAGINAL) 0.1 MG/GM vaginal cream  Start:  05/21/24 Disp:  42.5 g Refills:  12 of 12 remaining  Last AEX:  02/16/24 Next AEX:  02/19/25 Last MMG (if hormonal med):  2014 Refill authorized? Please Advise.

## 2024-08-27 ENCOUNTER — Other Ambulatory Visit (HOSPITAL_COMMUNITY): Payer: Self-pay

## 2024-08-27 ENCOUNTER — Ambulatory Visit: Admitting: Nurse Practitioner

## 2024-08-27 VITALS — BP 122/86 | HR 96 | Temp 98.1°F | Ht 64.0 in | Wt 167.2 lb

## 2024-08-27 DIAGNOSIS — G4733 Obstructive sleep apnea (adult) (pediatric): Secondary | ICD-10-CM | POA: Diagnosis not present

## 2024-08-27 DIAGNOSIS — E785 Hyperlipidemia, unspecified: Secondary | ICD-10-CM | POA: Diagnosis not present

## 2024-08-27 DIAGNOSIS — R41840 Attention and concentration deficit: Secondary | ICD-10-CM

## 2024-08-27 DIAGNOSIS — R5383 Other fatigue: Secondary | ICD-10-CM

## 2024-08-27 DIAGNOSIS — I781 Nevus, non-neoplastic: Secondary | ICD-10-CM

## 2024-08-27 DIAGNOSIS — I10 Essential (primary) hypertension: Secondary | ICD-10-CM

## 2024-08-27 DIAGNOSIS — Z126 Encounter for screening for malignant neoplasm of bladder: Secondary | ICD-10-CM

## 2024-08-27 DIAGNOSIS — M79604 Pain in right leg: Secondary | ICD-10-CM | POA: Diagnosis not present

## 2024-08-27 DIAGNOSIS — M7989 Other specified soft tissue disorders: Secondary | ICD-10-CM

## 2024-08-27 DIAGNOSIS — M79605 Pain in left leg: Secondary | ICD-10-CM | POA: Diagnosis not present

## 2024-08-27 LAB — COMPREHENSIVE METABOLIC PANEL WITH GFR
ALT: 17 U/L (ref 3–35)
AST: 14 U/L (ref 5–37)
Albumin: 4.2 g/dL (ref 3.5–5.2)
Alkaline Phosphatase: 106 U/L (ref 39–117)
BUN: 13 mg/dL (ref 6–23)
CO2: 27 meq/L (ref 19–32)
Calcium: 9.2 mg/dL (ref 8.4–10.5)
Chloride: 107 meq/L (ref 96–112)
Creatinine, Ser: 0.76 mg/dL (ref 0.40–1.20)
GFR: 87 mL/min
Glucose, Bld: 94 mg/dL (ref 70–99)
Potassium: 3.7 meq/L (ref 3.5–5.1)
Sodium: 141 meq/L (ref 135–145)
Total Bilirubin: 0.5 mg/dL (ref 0.2–1.2)
Total Protein: 6.8 g/dL (ref 6.0–8.3)

## 2024-08-27 LAB — CK: Total CK: 88 U/L (ref 17–177)

## 2024-08-27 LAB — URINALYSIS, MICROSCOPIC ONLY: RBC / HPF: NONE SEEN

## 2024-08-27 LAB — CBC
HCT: 37.8 % (ref 36.0–46.0)
Hemoglobin: 12.8 g/dL (ref 12.0–15.0)
MCHC: 33.9 g/dL (ref 30.0–36.0)
MCV: 92.9 fl (ref 78.0–100.0)
Platelets: 323 K/uL (ref 150.0–400.0)
RBC: 4.06 Mil/uL (ref 3.87–5.11)
RDW: 13.7 % (ref 11.5–15.5)
WBC: 7.1 K/uL (ref 4.0–10.5)

## 2024-08-27 LAB — LIPID PANEL
Cholesterol: 169 mg/dL (ref 28–200)
HDL: 64.1 mg/dL
LDL Cholesterol: 78 mg/dL (ref 10–99)
NonHDL: 104.73
Total CHOL/HDL Ratio: 3
Triglycerides: 132 mg/dL (ref 10.0–149.0)
VLDL: 26.4 mg/dL (ref 0.0–40.0)

## 2024-08-27 LAB — HEMOGLOBIN A1C: Hgb A1c MFr Bld: 5.5 % (ref 4.6–6.5)

## 2024-08-27 LAB — TSH: TSH: 1.56 u[IU]/mL (ref 0.35–5.50)

## 2024-08-27 LAB — VITAMIN D 25 HYDROXY (VIT D DEFICIENCY, FRACTURES): VITD: 30.28 ng/mL (ref 30.00–100.00)

## 2024-08-27 LAB — VITAMIN B12: Vitamin B-12: 366 pg/mL (ref 211–911)

## 2024-08-27 MED ORDER — BUPROPION HCL ER (XL) 150 MG PO TB24
150.0000 mg | ORAL_TABLET | Freq: Every day | ORAL | 1 refills | Status: DC
  Filled 2024-08-27: qty 30, 30d supply, fill #0
  Filled 2024-09-20: qty 30, 30d supply, fill #1

## 2024-08-27 NOTE — Progress Notes (Signed)
 "  Established Patient Office Visit  Subjective   Patient ID: Kelly [name], female    DOB: 1965-11-01  Age: 59 y.o. MRN: [id number]  Chief Complaint  Patient presents with   Pain    Pt complains of achy pain in legs when riding/driving in car. States of swelling. Pain mostly occurs when sitting.    Fatigue   Medication Management    ADD    HPI  Discussed the use of AI scribe software for clinical note transcription with the patient, who gave verbal consent to proceed.  History of Present Illness Kelly [name] is a 60 year old female who presents with leg pain and swelling.  She has been experiencing achiness in her legs for a few months, primarily in the thighs rather than the calves. The pain worsens after sitting for extended periods, such as during car rides, and she notes significant discomfort after a one-hour drive to [place]. The pain improves with movement. She is currently on muscle relaxers and Lyrica  for chronic pain management but feels this leg pain is a new issue.  She also experiences sciatica with occasional pain radiating down the buttocks, which she distinguishes from her current leg pain. Additionally, she mentions new, unusual foot pain in both feet, despite having a history of plantar fasciitis and receiving treatments such as injections and a pulsating treatment similar to lithotripsy.  She has an appointment with podiatry  There is increased leg swelling, particularly in the lower legs, and she is cautious about wearing tight clothing. She also reports the recent development of varicose and spider veins.  She experiences significant fatigue, which has worsened over time. Despite a busy work schedule, she finds herself nearly falling asleep at her desk. She has previously used phentermine  for stimulation but has not taken it for at least a year. She suspects her fatigue may be related to hormonal changes, as she is at an age where menopause is possible, although she  still has an IUD.  Her sleep schedule is irregular, with bedtimes ranging from midnight to 2 AM and wake-up times between 6 and 6:30 AM, resulting in 4 to 6 hours of sleep on weekdays. She has a history of ADD-like symptoms, which she attributes to her inability to focus and complete tasks. She also reports chronic dry mouth and snoring, and although she has a CPAP machine for sleep apnea, she has not used it for several months.  Her current medications include amlodipine , atorvastatin , baby aspirin , Zyrtec, Repatha , omeprazole  as needed, oxycodone , Lyrica , topiramate , Ubrelvy , and Botox  for migraines. She has not been taking Repatha  recently due to concerns it may contribute to her pain.    Review of Systems  Constitutional:  Negative for chills and fever.  Respiratory:  Negative for shortness of breath.   Cardiovascular:  Negative for chest pain.  Musculoskeletal:  Positive for joint pain and myalgias.  Neurological:  Negative for weakness and headaches.      Objective:     BP 122/86   Pulse 96   Temp 98.1 F (36.7 C) (Oral)   Ht 5' 4 (1.626 m)   Wt 167 lb 3.2 oz (75.8 kg)   SpO2 98%   BMI 28.70 kg/m    Physical Exam Vitals and nursing note reviewed.  Constitutional:      Appearance: Normal appearance.  Cardiovascular:     Rate and Rhythm: Normal rate and regular rhythm.     Pulses:  Posterior tibial pulses are 2+ on the right side and 2+ on the left side.     Heart sounds: Normal heart sounds.  Pulmonary:     Effort: Pulmonary effort is normal.     Breath sounds: Normal breath sounds.  Musculoskeletal:     Right lower leg: No edema.     Left lower leg: No edema.     Comments: Bilateral lower extremity strength 5/5  Neurological:     Mental Status: She is alert.      No results found for any visits on 08/27/24.    The 10-year ASCVD risk score (Arnett DK, et al., 2019) is: 1.9%    Assessment & Plan:   Problem List Items Addressed This Visit        Cardiovascular and Mediastinum   Hypertension   Relevant Orders   CBC   Comprehensive metabolic panel with GFR   Hemoglobin A1c   Lipid panel   Other Visit Diagnoses       Spider veins    -  Primary     Localized swelling of lower extremity         Pain in both lower extremities       Relevant Orders   CK     Concentration deficit       Relevant Medications   buPROPion  (WELLBUTRIN  XL) 150 MG 24 hr tablet   Other Relevant Orders   TSH   Ambulatory referral to Psychology     Fatigue, unspecified type       Relevant Orders   CBC   Vitamin B12   Comprehensive metabolic panel with GFR   VITAMIN D  25 Hydroxy (Vit-D Deficiency, Fractures)   TSH     Screening for bladder cancer       Relevant Orders   Urine Microscopic     Assessment and Plan Assessment & Plan Chronic venous insufficiency with varicose and spider veins Chronic venous insufficiency with varicose and spider veins causing leg swelling and pain. Compression therapy recommended. Sclerotherapy considered for cosmetic improvement. - Recommended compression socks for swelling and discomfort. - Consider referral to vascular specialist for sclerotherapy if symptoms persist.  Obstructive sleep apnea, untreated Obstructive sleep apnea with non-compliance to CPAP therapy contributing to fatigue. - Encouraged resumption of CPAP therapy.  Attention-deficit hyperactivity disorder, suspected Suspected ADHD with symptoms of inattention. Wellbutrin  considered for treatment. - Prescribed Wellbutrin  150 mg once daily. - Discussed potential side effects and benefits of Wellbutrin . -Referral to psychology for official testing  Migraine Chronic migraines managed by neurology with topiramate  and Botox .  Primary hypertension Hypertension managed with amlodipine .  Hyperlipidemia Managed with atorvastatin  and Repatha . Concerns about statin-related muscle pain. - Ordered CK level to assess for muscle breakdown. -  Continue atorvastatin  and Repatha .  Gastroesophageal reflux disease GERD managed with omeprazole  as needed.  General health maintenance Discussion of recent COVID-19 infection. Follow-up planned after lab results. - Ordered comprehensive blood work including electrolytes, blood counts, and lipid panel.   Return in about 4 weeks (around 09/24/2024) for Attention/Wellbutrin .    Adina Crandall, NP  "

## 2024-08-27 NOTE — Progress Notes (Unsigned)
 "  Office Visit Note  Patient: Kelly [name]             Date of Birth: 1966/01/13           MRN: [id number]             PCP: Wendee Lynwood HERO, NP Referring: Wendee Lynwood HERO, NP Visit Date: 09/05/2024 Occupation: Data Unavailable  Subjective:  No chief complaint on file.   History of Present Illness: Kelly [name] is a 59 y.o. female ***     Activities of Daily Living:  Patient reports morning stiffness for *** {minute/hour:19697}.   Patient {ACTIONS;DENIES/REPORTS:21021675::Denies} nocturnal pain.  Difficulty dressing/grooming: {ACTIONS;DENIES/REPORTS:21021675::Denies} Difficulty climbing stairs: {ACTIONS;DENIES/REPORTS:21021675::Denies} Difficulty getting out of chair: {ACTIONS;DENIES/REPORTS:21021675::Denies} Difficulty using hands for taps, buttons, cutlery, and/or writing: {ACTIONS;DENIES/REPORTS:21021675::Denies}  No Rheumatology ROS completed.   PMFS History:  Patient Active Problem List   Diagnosis Date Noted   Cervical radiculopathy 02/19/2024   Right corneal abrasion 11/22/2023   Recurrent UTI 11/20/2023   Upper respiratory tract infection 11/09/2023   Ptosis of right eyelid 05/24/2022   Blurred vision 05/24/2022   Numbness and tingling in both hands 07/25/2020   Overweight 07/25/2020   Positive depression screening 07/25/2020   Stressful life event affecting family 07/12/2019   Caregiver burden 07/12/2019   Tobacco abuse counseling 07/12/2019   Protrusion of cervical intervertebral disc 04/10/2019   Spinal stenosis of cervical region 04/10/2019   Overactive bladder 11/19/2018   Photosensitization due to sun 11/02/2018   Generalized pain 11/02/2018   High risk medication use 11/02/2018   Plantar fasciitis 05/10/2018   Patellofemoral pain syndrome of both knees 05/10/2018   History of renal stone 04/17/2018   Urinary frequency 04/17/2018   Chronic migraine without aura without status migrainosus, not intractable 11/01/2016   Dizziness and giddiness  05/11/2016   Hemorrhoidal skin tag 04/07/2015   IUD (intrauterine device) in place 05/03/2013   Allergic rhinitis 01/11/2013   Current smoker 01/11/2013   Weight gain 06/26/2012   Cervical stenosis (uterine cervix) 04/25/2012   Anticardiolipin antibody positive    Hyperlipidemia    Hypertension    History of trichomoniasis 01/18/2011    Class: History of   THYROID  NODULE, RIGHT 02/05/2008   GERD 02/05/2008   NECK PAIN 02/05/2008    Past Medical History:  Diagnosis Date   Acute back pain 04/17/2018   Anal fistula    intersphincteric    Anemia    Anticardiolipin antibody positive    Cervicalgia    followed by dr alm molt   Community acquired pneumonia of left lower lobe of lung 07/06/2023   Coronary artery disease    Cystitis 06/19/2023   Decreased range of motion of finger of left hand 01/31/2018   Depression    Diarrhea 05/20/2016   Flank pain 04/17/2018   Generalized abdominal pain 04/17/2018   History of herpes genitalis    many yrs ago   History of hypertension 05/11/2016   History of kidney stones    History of migraine 06/10/2016   Hyperlipidemia    Hypertension    followed by pcp   (11-25-2019  per pt had stress test approx. 2018 w/ Dr Blanca, told normal)   Laceration of left hand 01/31/2018   Left knee pain 05/10/2018   Localized swelling on left hand 01/31/2018   Migraines    neurologist--- dr ines   OSA on CPAP    Pain in the chest 05/11/2016   TMJ (dislocation of temporomandibular joint)  11-25-2019  jaw pops, no appliance   Wears glasses     Family History  Problem Relation Age of Onset   Heart disease Mother 68       died suddenly of MI   Heart attack Mother    Diabetes Father    Hypertension Father    Cancer Father        colon cancer   Kidney Stones Father    Diabetes Sister    Stroke Sister    Heart disease Maternal Uncle 71       died of MI   Heart disease Maternal Uncle    Breast cancer Maternal Grandmother    Mental illness  Son    Healthy Daughter    Healthy Daughter    Sleep apnea Neg Hx    Past Surgical History:  Procedure Laterality Date   ANTERIOR CERVICAL DECOMP/DISCECTOMY FUSION  12/ 2020   @[facility]   C4 - 6   CESAREAN SECTION     X3   LAST ONE 11-30-2004   CYSTOSCOPY W/ RETROGRADES Right 10/29/2012   Procedure: CYSTOSCOPY WITH RETROGRADE PYELOGRAM and stent placement;  Surgeon: Glendia DELENA [name], MD;  Location: [facility];  Service: Urology;  Laterality: Right;  rt stent placement , rt retrograde and cysto    CYSTOSCOPY W/ URETERAL STENT PLACEMENT Right 11/23/2012   Procedure: CYSTOSCOPY WITH STENT REPLACEMENT;  Surgeon: Ricardo Likens, MD;  Location: [facility];  Service: Urology;  Laterality: Right;   CYSTOSCOPY/RETROGRADE/URETEROSCOPY/STONE EXTRACTION WITH BASKET Right 11/23/2012   Procedure: CYSTOSCOPY/RETROGRADE/URETEROSCOPY/STONE EXTRACTION WITH BASKET;  Surgeon: Ricardo Likens, MD;  Location: [facility];  Service: Urology;  Laterality: Right;   EVALUATION UNDER ANESTHESIA WITH ANAL FISSUROTOMY N/A 01/23/2020   Procedure: ANAL EXAM UNDER ANESTHESIA, FISTULOTOMY, SKIN TAG EXCISION;  Surgeon: Debby Hila, MD;  Location: [facility];  Service: General;  Laterality: N/A;   INTRAUTERINE DEVICE (IUD) INSERTION     mirena  inserted 07-11-18   SKIN CANCER DESTRUCTION     Social History[1] Social History   Social History Narrative   Lives at home w/ her children   Right-handed      Fulltime: [employer]          Issac (30) M   Gabriel (24) F   Annalyse (18) F      Hobbies : outside activities and volleyball      Immunization History  Administered Date(s) Administered   DTaP 08/10/2007   Influenza Split 05/21/2014, 05/22/2016, 06/15/2021, 07/03/2023   Influenza Whole 05/29/2013   Influenza-Unspecified 05/26/2015, 06/22/2018, 06/18/2019, 06/15/2020   PFIZER(Purple Top)SARS-COV-2 Vaccination 01/13/2020,  02/03/2020   Tdap 01/22/2018   Zoster Recombinant(Shingrix ) 06/14/2017, 11/06/2020     Objective: Vital Signs: There were no vitals taken for this visit.   Physical Exam   Musculoskeletal Exam: ***  CDAI Exam: CDAI Score: -- Patient Global: --; Provider Global: -- Swollen: --; Tender: -- Joint Exam 09/05/2024   No joint exam has been documented for this visit   There is currently no information documented on the homunculus. Go to the Rheumatology activity and complete the homunculus joint exam.  Investigation: No additional findings.  Imaging: No results found.  Recent Labs: Lab Results  Component Value Date   WBC 7.0 07/15/2024   HGB 12.4 07/15/2024   PLT 306 07/15/2024   NA 142 05/10/2023   K 3.9 05/10/2023   CL 107 (H) 05/10/2023   CO2 25 05/10/2023   GLUCOSE 125 (H) 05/10/2023  BUN 15 05/10/2023   CREATININE 0.78 05/10/2023   BILITOT 0.6 04/11/2023   ALKPHOS 113 04/11/2023   AST 15 04/11/2023   ALT 14 04/11/2023   PROT 7.0 04/11/2023   ALBUMIN 4.6 04/11/2023   CALCIUM  9.2 05/10/2023   GFRAA 105 06/26/2020   QFTBGOLDPLUS Negative 11/02/2018    Speciality Comments: No specialty comments available.  Procedures:  No procedures performed Allergies: Imitrex [sumatriptan base], Zomig, Avelox [moxifloxacin hcl in nacl], Morphine, Septra [bactrim], Sulfamethoxazole, Sumatriptan, and Trimethoprim   Assessment / Plan:     Visit Diagnoses: No diagnosis found.  Orders: No orders of the defined types were placed in this encounter.  No orders of the defined types were placed in this encounter.   Face-to-face time spent with patient was *** minutes. Greater than 50% of time was spent in counseling and coordination of care.  Follow-Up Instructions: No follow-ups on file.   Daved JAYSON Gavel, CMA  Note - This record has been created using Animal nutritionist.  Chart creation errors have been sought, but may not always  have been located. Such creation errors  do not reflect on  the standard of medical care.    [1]  Social History Tobacco Use   Smoking status: Former    Current packs/day: 0.00    Average packs/day: 0.5 packs/day for 15.0 years (7.5 ttl pk-yrs)    Types: E-cigarettes, Cigarettes    Start date: 08/22/2005    Quit date: 08/22/2020    Years since quitting: 4.0    Passive exposure: Never   Smokeless tobacco: Never   Tobacco comments:    vape  Vaping Use   Vaping status: Every Day  Substance Use Topics   Alcohol use: Not Currently   Drug use: Never   "

## 2024-08-27 NOTE — Patient Instructions (Addendum)
 Nice to see you today I will be in touch with the labs once I have them  Try using compression socks in the mean time Follow up with me in 1 month, sooner If you need me  Virtual visit is fine

## 2024-08-28 ENCOUNTER — Ambulatory Visit: Payer: Self-pay | Admitting: Nurse Practitioner

## 2024-08-28 ENCOUNTER — Other Ambulatory Visit: Payer: Self-pay

## 2024-08-28 DIAGNOSIS — M7989 Other specified soft tissue disorders: Secondary | ICD-10-CM

## 2024-08-28 DIAGNOSIS — M79604 Pain in right leg: Secondary | ICD-10-CM

## 2024-08-28 DIAGNOSIS — I781 Nevus, non-neoplastic: Secondary | ICD-10-CM

## 2024-08-30 ENCOUNTER — Ambulatory Visit: Admitting: Family Medicine

## 2024-08-30 ENCOUNTER — Encounter: Payer: Self-pay | Admitting: Family Medicine

## 2024-08-30 ENCOUNTER — Other Ambulatory Visit: Payer: Self-pay

## 2024-08-30 VITALS — BP 134/82 | HR 86 | Wt 169.4 lb

## 2024-08-30 DIAGNOSIS — M25512 Pain in left shoulder: Secondary | ICD-10-CM

## 2024-08-30 MED ORDER — LIDOCAINE HCL 1 % IJ SOLN
5.0000 mL | Freq: Once | INTRAMUSCULAR | Status: AC
  Administered 2024-08-30: 5 mL via INTRADERMAL

## 2024-08-30 MED ORDER — BUPIVACAINE HCL 0.25 % IJ SOLN
2.0000 mL | Freq: Once | INTRAMUSCULAR | Status: AC
  Administered 2024-08-30: 2 mL

## 2024-08-30 MED ORDER — METHYLPREDNISOLONE ACETATE 40 MG/ML IJ SUSP
40.0000 mg | Freq: Once | INTRAMUSCULAR | Status: AC
  Administered 2024-08-30: 40 mg via INTRAMUSCULAR

## 2024-08-30 NOTE — Progress Notes (Signed)
 "  Name: Kelly [name]   Date of Visit: 08/30/2024   Date of last visit with me: Visit date not found   CHIEF COMPLAINT:  Chief Complaint  Patient presents with   Procedure    L shoulder injection         HPI:  Discussed the use of AI scribe software for clinical note transcription with the patient, who gave verbal consent to proceed.  History of Present Illness   Kelly [name] is a 59 year old female with a history of rotator cuff issues who presents with shoulder pain.  She has been experiencing shoulder pain for several months, with variable intensity. The pain has been present for less than a year, with some days being worse than others.  She previously underwent an MRI and received an injection, which initially provided relief. The injection was administered from the back without the use of ultrasound.  Currently, she reports experiencing pain on the day of the visit.  She has a history of a rotator cuff issue.  No issues with needles and has tattoos. She confirms experiencing pain on the day of the visit.         OBJECTIVE:       08/27/2024   12:07 PM  Depression screen PHQ 2/9  Decreased Interest 1  Down, Depressed, Hopeless 1  PHQ - 2 Score 2  Altered sleeping 1  Tired, decreased energy 3  Change in appetite 0  Feeling bad or failure about yourself  3  Trouble concentrating 3  Moving slowly or fidgety/restless 2  Suicidal thoughts 1  PHQ-9 Score 15  Difficult doing work/chores Very difficult     BP Readings from Last 3 Encounters:  08/30/24 134/82  08/27/24 122/86  07/24/24 136/89    BP 134/82   Pulse 86   Wt 169 lb 6.4 oz (76.8 kg)   SpO2 99%   BMI 29.08 kg/m    Physical Exam Constitutional:      Appearance: Normal appearance.  Neurological:     General: No focal deficit present.     Mental Status: She is alert and oriented to person, place, and time. Mental status is at baseline.     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 MRI and previous injection provided temporary relief. Tendon thickening with scar tissue noted. Ultrasound-guided injection performed for accurate placement. Immediate relief from numbing medicine; steroid effects expected in two days. Goal to build muscle around scar tissue to prevent pain recurrence. - Administered ultrasound-guided steroid injection to left shoulder. - Provided rotator cuff exercises to start by Monday next week. - Advised to avoid overexertion and limit activities to baseline for two days. - Instructed to begin exercises slowly, avoiding pain levels above 4 out of 10. - Scheduled follow-up to assess injection effectiveness next week.       US -guided glenohumeral joint injection, left shoulder After discussion on risks/benefits/indications, informed verbal consent was obtained. A timeout was then performed. The patient was positioned lying lateral recumbent on examination table. The patient's shoulder was prepped with betadine and multiple alcohol swabs and utilizing ultrasound guidance, the patient's glenohumeral joint was identified on ultrasound. Using ultrasound guidance a 22-gauge, 3.5 inch needle with a mixture of 2:2:2 cc's lidocaine :bupivicaine:depomedrol was directed from a lateral to medial direction via in-plane technique into  the glenohumeral joint with visualization of appropriate spread of injectate into the joint. Patient tolerated the procedure well without immediate complications.      Tressa Maldonado A. Vita MD [facility] "

## 2024-08-30 NOTE — Addendum Note (Signed)
 Addended by: LATTIE CARLO BROCKS on: 08/30/2024 03:53 PM   Modules accepted: Orders

## 2024-08-30 NOTE — Telephone Encounter (Signed)
-----   Message from [facility] T sent at 08/28/2024  2:15 PM EST -----  ----- Message ----- From: Wendee Lynwood HERO, NP Sent: 08/28/2024   7:27 AM EST To: Wendee Gunnels  Notified via My Chart

## 2024-09-03 NOTE — Telephone Encounter (Signed)
 Thanks for the update!

## 2024-09-04 NOTE — Progress Notes (Signed)
 "  Office Visit Note  Patient: Kelly [name]             Date of Birth: [birth year]           MRN: [id number]             PCP: Wendee Lynwood HERO, NP Referring: Wendee Lynwood HERO, NP Visit Date: 09/12/2024 Occupation: Data Unavailable  Subjective:  Dry mouth and dry eyes  History of Present Illness: Kelly [name] is a 59 y.o. female with positive ANA and osteoarthritis.  She returns today after her last visit in August 2024.  She reports fatigue, dry mouth, dry eyes, arthralgias, Raynaud's, hair loss, photosensitivity.  She reports increased discomfort in her hands.  She has not noticed joint swelling.  She continues to have discomfort in her lower extremity muscles.  She continues to have pain and stiffness.  She goes to pain management and is taking medications for pain which includes oxycodone , Flexeril  and Lyrica .    Activities of Daily Living:  Patient reports morning stiffness for 15-20 minutes.   Patient Reports nocturnal pain.  Difficulty dressing/grooming: Denies Difficulty climbing stairs: Denies Difficulty getting out of chair: Denies Difficulty using hands for taps, buttons, cutlery, and/or writing: Reports  Review of Systems  Constitutional:  Positive for fatigue.  HENT:  Positive for mouth dryness and nose dryness. Negative for mouth sores.        Nose sores  Eyes:  Positive for dryness.  Respiratory:  Negative for shortness of breath.   Cardiovascular:  Positive for swelling in legs/feet. Negative for chest pain and palpitations.  Gastrointestinal:  Negative for blood in stool, constipation and diarrhea.  Endocrine: Negative for increased urination.  Genitourinary:  Negative for involuntary urination.  Musculoskeletal:  Positive for joint pain, joint pain, myalgias, morning stiffness, muscle tenderness and myalgias. Negative for gait problem, joint swelling and muscle weakness.  Skin:  Positive for color change, hair loss and sensitivity to sunlight. Negative for rash.   Allergic/Immunologic: Negative for susceptible to infections.  Neurological:  Positive for dizziness, numbness and headaches.  Hematological:  Negative for swollen glands.  Psychiatric/Behavioral:  Negative for depressed mood and sleep disturbance. The patient is not nervous/anxious.     PMFS History:  Patient Active Problem List   Diagnosis Date Noted   Cervical radiculopathy 02/19/2024   Right corneal abrasion 11/22/2023   Recurrent UTI 11/20/2023   Upper respiratory tract infection 11/09/2023   Ptosis of right eyelid 05/24/2022   Blurred vision 05/24/2022   Numbness and tingling in both hands 07/25/2020   Overweight 07/25/2020   Positive depression screening 07/25/2020   Stressful life event affecting family 07/12/2019   Caregiver burden 07/12/2019   Tobacco abuse counseling 07/12/2019   Protrusion of cervical intervertebral disc 04/10/2019   Spinal stenosis of cervical region 04/10/2019   Overactive bladder 11/19/2018   Photosensitization due to sun 11/02/2018   Generalized pain 11/02/2018   High risk medication use 11/02/2018   Plantar fasciitis 05/10/2018   Patellofemoral pain syndrome of both knees 05/10/2018   History of renal stone 04/17/2018   Urinary frequency 04/17/2018   Chronic migraine without aura without status migrainosus, not intractable 11/01/2016   Dizziness and giddiness 05/11/2016   Hemorrhoidal skin tag 04/07/2015   IUD (intrauterine device) in place 05/03/2013   Allergic rhinitis 01/11/2013   Current smoker 01/11/2013   Weight gain 06/26/2012   Cervical stenosis (uterine cervix) 04/25/2012   Anticardiolipin antibody positive    Hyperlipidemia  Hypertension    History of trichomoniasis 01/18/2011    Class: History of   THYROID  NODULE, RIGHT 02/05/2008   GERD 02/05/2008   NECK PAIN 02/05/2008    Past Medical History:  Diagnosis Date   Acute back pain 04/17/2018   Anal fistula    intersphincteric    Anemia    Anticardiolipin antibody  positive    Basal cell carcinoma 2025   Cervicalgia    followed by dr alm molt   Community acquired pneumonia of left lower lobe of lung 07/06/2023   Coronary artery disease    Cystitis 06/19/2023   Decreased range of motion of finger of left hand 01/31/2018   Depression    Diarrhea 05/20/2016   Flank pain 04/17/2018   Generalized abdominal pain 04/17/2018   History of herpes genitalis    many yrs ago   History of hypertension 05/11/2016   History of kidney stones    History of migraine 06/10/2016   Hyperlipidemia    Hypertension    followed by pcp   (11-25-2019  per pt had stress test approx. 2018 w/ Dr Blanca, told normal)   Laceration of left hand 01/31/2018   Left knee pain 05/10/2018   Localized swelling on left hand 01/31/2018   Migraines    neurologist--- dr ines   OSA on CPAP    Pain in the chest 05/11/2016   TMJ (dislocation of temporomandibular joint)    11-25-2019  jaw pops, no appliance   Wears glasses     Family History  Problem Relation Age of Onset   Heart disease Mother 82       died suddenly of MI   Heart attack Mother    Diabetes Father    Hypertension Father    Cancer Father        colon cancer   Kidney Stones Father    Diabetes Sister    Stroke Sister    Heart disease Maternal Uncle 55       died of MI   Heart disease Maternal Uncle    Breast cancer Maternal Grandmother    Mental illness Son    Healthy Daughter    Healthy Daughter    Sleep apnea Neg Hx    Past Surgical History:  Procedure Laterality Date   ANTERIOR CERVICAL DECOMP/DISCECTOMY FUSION  12/ 2020   @[facility]   C4 - 6   CESAREAN SECTION     X3   LAST ONE 11-30-2004   CYSTOSCOPY W/ RETROGRADES Right 10/29/2012   Procedure: CYSTOSCOPY WITH RETROGRADE PYELOGRAM and stent placement;  Surgeon: Glendia DELENA [name], MD;  Location: [facility];  Service: Urology;  Laterality: Right;  rt stent placement , rt retrograde and cysto    CYSTOSCOPY W/  URETERAL STENT PLACEMENT Right 11/23/2012   Procedure: CYSTOSCOPY WITH STENT REPLACEMENT;  Surgeon: Ricardo Likens, MD;  Location: [facility];  Service: Urology;  Laterality: Right;   CYSTOSCOPY/RETROGRADE/URETEROSCOPY/STONE EXTRACTION WITH BASKET Right 11/23/2012   Procedure: CYSTOSCOPY/RETROGRADE/URETEROSCOPY/STONE EXTRACTION WITH BASKET;  Surgeon: Ricardo Likens, MD;  Location: [facility];  Service: Urology;  Laterality: Right;   EVALUATION UNDER ANESTHESIA WITH ANAL FISSUROTOMY N/A 01/23/2020   Procedure: ANAL EXAM UNDER ANESTHESIA, FISTULOTOMY, SKIN TAG EXCISION;  Surgeon: Debby Hila, MD;  Location: [facility];  Service: General;  Laterality: N/A;   INTRAUTERINE DEVICE (IUD) INSERTION     mirena  inserted 07-11-18   MOHS SURGERY  2025   SKIN CANCER DESTRUCTION  Social History[1] Social History   Social History Narrative   Lives at home w/ her children   Right-handed      Fulltime: [employer]          Issac (30) M   Gabriel (24) F   Annalyse (18) F      Hobbies : outside activities and volleyball      Immunization History  Administered Date(s) Administered   DTaP 08/10/2007   Influenza Split 05/21/2014, 05/22/2016, 06/15/2021, 07/03/2023   Influenza Whole 05/29/2013   Influenza-Unspecified 05/26/2015, 06/22/2018, 06/18/2019, 06/15/2020   PFIZER(Purple Top)SARS-COV-2 Vaccination 01/13/2020, 02/03/2020   Tdap 01/22/2018   Zoster Recombinant(Shingrix ) 06/14/2017, 11/06/2020     Objective: Vital Signs: BP 127/85   Pulse 98   Temp 97.6 F (36.4 C)   Resp 15   Ht 5' 4 (1.626 m)   Wt 166 lb 6.4 oz (75.5 kg)   BMI 28.56 kg/m    Physical Exam Vitals and nursing note reviewed.  Constitutional:      Appearance: She is well-developed.  HENT:     Head: Normocephalic and atraumatic.     Mouth/Throat:     Comments: No oral ulcers or nasal ulcers were noted.  Good oral moisture was noted. Eyes:      Conjunctiva/sclera: Conjunctivae normal.  Neck:     Comments: No cervical adenopathy or parotid swelling noted. Cardiovascular:     Rate and Rhythm: Normal rate and regular rhythm.     Heart sounds: Normal heart sounds.  Pulmonary:     Effort: Pulmonary effort is normal.     Breath sounds: Normal breath sounds.  Abdominal:     General: Bowel sounds are normal.     Palpations: Abdomen is soft.  Musculoskeletal:     Cervical back: Normal range of motion.  Skin:    General: Skin is warm and dry.     Capillary Refill: Capillary refill takes less than 2 seconds. No nailbed capillary changes or sclerodactyly was noted. Neurological:     Mental Status: She is alert and oriented to person, place, and time.  Psychiatric:        Behavior: Behavior normal.      Musculoskeletal Exam: Cervical, thoracic and lumbar spine were in good range of motion.  There was no SI joint tenderness.  Shoulder joints, elbow joints, wrist joints, MCPs, PIPs and DIPs were in good range of motion with no synovitis.  Hip joints and knee joints were in good range of motion without any warmth swelling or effusion.  There was no tenderness over ankles or MTPs.   CDAI Exam: CDAI Score: -- Patient Global: --; Provider Global: -- Swollen: --; Tender: -- Joint Exam 09/12/2024   No joint exam has been documented for this visit   There is currently no information documented on the homunculus. Go to the Rheumatology activity and complete the homunculus joint exam.  Investigation: No additional findings.  Imaging: US  LIMITED JOINT SPACE STRUCTURES UP LEFT Result Date: 09/04/2024 US -guided glenohumeral joint injection, left shoulder After discussion on risks/benefits/indications, informed verbal consent was obtained. A timeout was then performed. The patient was positioned lying lateral recumbent on examination table. The patient's shoulder was prepped with betadine and multiple alcohol swabs and utilizing ultrasound  guidance, the patient's glenohumeral joint was identified on ultrasound. Using ultrasound guidance a 22-gauge, 3.5 inch needle with a mixture of 2:2:2 cc's lidocaine :bupivicaine:depomedrol was directed from a lateral to medial direction via in-plane technique into the glenohumeral joint with visualization  of appropriate spread of injectate into the joint. Patient tolerated the procedure well without immediate complications.    Recent Labs: Lab Results  Component Value Date   WBC 7.1 08/27/2024   HGB 12.8 08/27/2024   PLT 323.0 08/27/2024   NA 141 08/27/2024   K 3.7 08/27/2024   CL 107 08/27/2024   CO2 27 08/27/2024   GLUCOSE 94 08/27/2024   BUN 13 08/27/2024   CREATININE 0.76 08/27/2024   BILITOT 0.5 08/27/2024   ALKPHOS 106 08/27/2024   AST 14 08/27/2024   ALT 17 08/27/2024   PROT 6.8 08/27/2024   ALBUMIN 4.2 08/27/2024   CALCIUM  9.2 08/27/2024   GFRAA 105 06/26/2020   QFTBGOLDPLUS Negative 11/02/2018   April 11, 2023 urine protein creatinine ratio normal, ANA positive, ENA panel negative, sed rate 6, CK120  August 27, 2024 LDL 78, vitamin D30.28, TSH normal, CK 88  Speciality Comments: No specialty comments available.  Procedures:  No procedures performed Allergies: Imitrex [sumatriptan base], Zomig, Avelox [moxifloxacin hcl in nacl], Morphine, Septra [bactrim], Sulfamethoxazole, Sumatriptan, and Trimethoprim   Assessment / Plan:     Visit Diagnoses: Raynaud's phenomenon without gangrene - 03/24/22 UA neg, ANA 1:40NS, ENA neg, C3-C4 normal, beta-2  neg, anticardiolipin neg,MPO neg, serine protease 3 neg, ESR 11, RF neg, anti-CCP neg, cryoglobulin neg: She continues to have Raynauds symptoms.  No nailbed capillary changes or sclerodactyly was noted.  She had good capillary refill and her fingers were warm to touch.  There is no history of digital ulcers.  Positive ANA (antinuclear antibody) - patient had low titer ANA in the past. -August 20, 2024urine protein creatinine ratio  normal, ANA positive, ENA panel negative, sed rate 6.  Patient believes her symptoms are getting worse with increased joint pain.  She reports ongoing hair loss, dry mouth, dry eyes, photosensitivity, fatigue and arthralgias.  No synovitis was noted on the examination.  I will recheck labs today.  Plan: Protein / creatinine ratio, urine, ANA, Anti-scleroderma antibody, RNP Antibody, Anti-Smith antibody, Sjogrens syndrome-A extractable nuclear antibody, Sjogrens syndrome-B extractable nuclear antibody, Anti-DNA antibody, double-stranded, C3 and C4.  Patient wanted to know if she can try medication for the treatment of lupus and Sjogren's overlap.  She is concerned because of her sisters history.  I advised against immunosuppression however if we have to do the drug we can try Plaquenil for a few months to see if her symptoms improved.  A handout on Plaquenil was provided.  I also discussed that she can go to [facility] rheumatology for second opinion regarding Sjogren's.  Hair loss-no hair thinning was noted.  Dry mouth-history of dry mouth, dry eyes, dry skin.  She states her sister has been recently diagnosed with Sjogren's.  She had no parotid swelling or lymphadenopathy.  I discussed possible CT scan of the parotid gland to evaluate.  Patient will let me know.  Photosensitization due to sun-use of sunscreen was advised.  Other fatigue-she is ongoing fatigue.  Primary osteoarthritis of both hands - Clinical and radiographic findings were consistent with osteoarthritis.  She complains of increased joint pain.  She had bilateral DIP and PIP thickening with no synovitis.   Trochanteric bursitis of both hips-she has intermittent pain.  Patellofemoral pain syndrome of both knees  DDD (degenerative disc disease), cervical-she continues to have chronic pain and radiculopathy.  She has been going to pain management.  Spinal stenosis of cervical region  Plantar fasciitis-no recent episodes.  Myalgia - she  gives history of muscle cramps.  CK remains normal.  Vitamin D  deficiency-Ro MND is low.  She has been taking vitamin D  supplement.  Other medical problems are listed as follows:  Primary hypertension  Chronic migraine without aura without status migrainosus, not intractable  History of hyperlipidemia  Overactive bladder  Gastroesophageal reflux disease without esophagitis  Abdominal bloating - Plan: Tissue transglutaminase, IgA, Gliadin antibodies, serum  History of renal stone  IUD (intrauterine device) in place  Family history of MS (multiple sclerosis)-Sister  Former smoker - 1 pack/day for 15 years.  She is vaping now.  Orders: Orders Placed This Encounter  Procedures   Protein / creatinine ratio, urine   ANA   Anti-scleroderma antibody   RNP Antibody   Anti-Smith antibody   Sjogrens syndrome-A extractable nuclear antibody   Sjogrens syndrome-B extractable nuclear antibody   Anti-DNA antibody, double-stranded   C3 and C4   Tissue transglutaminase, IgA   Gliadin antibodies, serum   No orders of the defined types were placed in this encounter.    Follow-Up Instructions: Return in about 1 year (around 09/12/2025) for Osteoarthritis, Raynaud's, positive ANA.   Maya Nash, MD  Note - This record has been created using Animal nutritionist.  Chart creation errors have been sought, but may not always  have been located. Such creation errors do not reflect on  the standard of medical care.     [1]  Social History Tobacco Use   Smoking status: Former    Current packs/day: 0.00    Average packs/day: 0.5 packs/day for 15.0 years (7.5 ttl pk-yrs)    Types: E-cigarettes, Cigarettes    Start date: 08/22/2005    Quit date: 08/22/2020    Years since quitting: 4.0    Passive exposure: Never   Smokeless tobacco: Never   Tobacco comments:    vape  Vaping Use   Vaping status: Every Day  Substance Use Topics   Alcohol use: Yes    Comment: rarely   Drug use:  Never   "

## 2024-09-05 ENCOUNTER — Ambulatory Visit: Admitting: Rheumatology

## 2024-09-05 ENCOUNTER — Other Ambulatory Visit (HOSPITAL_COMMUNITY): Payer: Self-pay

## 2024-09-05 DIAGNOSIS — M722 Plantar fascial fibromatosis: Secondary | ICD-10-CM

## 2024-09-05 DIAGNOSIS — Z820 Family history of epilepsy and other diseases of the nervous system: Secondary | ICD-10-CM

## 2024-09-05 DIAGNOSIS — M222X1 Patellofemoral disorders, right knee: Secondary | ICD-10-CM

## 2024-09-05 DIAGNOSIS — L568 Other specified acute skin changes due to ultraviolet radiation: Secondary | ICD-10-CM

## 2024-09-05 DIAGNOSIS — N3281 Overactive bladder: Secondary | ICD-10-CM

## 2024-09-05 DIAGNOSIS — M4802 Spinal stenosis, cervical region: Secondary | ICD-10-CM

## 2024-09-05 DIAGNOSIS — Z8639 Personal history of other endocrine, nutritional and metabolic disease: Secondary | ICD-10-CM

## 2024-09-05 DIAGNOSIS — L659 Nonscarring hair loss, unspecified: Secondary | ICD-10-CM

## 2024-09-05 DIAGNOSIS — R682 Dry mouth, unspecified: Secondary | ICD-10-CM

## 2024-09-05 DIAGNOSIS — I7300 Raynaud's syndrome without gangrene: Secondary | ICD-10-CM

## 2024-09-05 DIAGNOSIS — I10 Essential (primary) hypertension: Secondary | ICD-10-CM

## 2024-09-05 DIAGNOSIS — E559 Vitamin D deficiency, unspecified: Secondary | ICD-10-CM

## 2024-09-05 DIAGNOSIS — M19042 Primary osteoarthritis, left hand: Secondary | ICD-10-CM

## 2024-09-05 DIAGNOSIS — G43709 Chronic migraine without aura, not intractable, without status migrainosus: Secondary | ICD-10-CM

## 2024-09-05 DIAGNOSIS — Z975 Presence of (intrauterine) contraceptive device: Secondary | ICD-10-CM

## 2024-09-05 DIAGNOSIS — M7061 Trochanteric bursitis, right hip: Secondary | ICD-10-CM

## 2024-09-05 DIAGNOSIS — K219 Gastro-esophageal reflux disease without esophagitis: Secondary | ICD-10-CM

## 2024-09-05 DIAGNOSIS — R5383 Other fatigue: Secondary | ICD-10-CM

## 2024-09-05 DIAGNOSIS — M791 Myalgia, unspecified site: Secondary | ICD-10-CM

## 2024-09-05 DIAGNOSIS — M5030 Other cervical disc degeneration, unspecified cervical region: Secondary | ICD-10-CM

## 2024-09-05 DIAGNOSIS — Z87891 Personal history of nicotine dependence: Secondary | ICD-10-CM

## 2024-09-05 DIAGNOSIS — R7689 Other specified abnormal immunological findings in serum: Secondary | ICD-10-CM

## 2024-09-05 DIAGNOSIS — Z87442 Personal history of urinary calculi: Secondary | ICD-10-CM

## 2024-09-09 NOTE — Progress Notes (Unsigned)
 " Cardiology Office Note:  .   Date:  09/13/2024  ID:  Kelly [name], DOB Mar 20, 1966, MRN [id number] PCP: Wendee Lynwood HERO, NP  [facility] Providers Cardiologist:  Darryle ONEIDA Decent, MD   History of Present Illness: .    Chief Complaint  Patient presents with   Follow-up    Kelly [name] is a 59 y.o. female with below history who presents for follow-up.   History of Present Illness   Kelly [name] is a 59 year old female with nonobstructive CAD and hyperlipidemia who presents for follow-up.  She has been experiencing increased leg pain, which she attributes to her irregular use of Repatha . She has a history of muscle pain with Lipitor, leading to a switch to Crestor  and then back to a low dose of Lipitor, which she tolerates better. Her chronic leg pain has worsened, with swelling and new varicose veins developing this year.  She experiences occasional chest pain that resolves on its own. She maintains an active lifestyle, walking frequently and exercising once or twice a week. Her blood pressure is well controlled on amlodipine  2.5 mg daily.  She is a former smoker and currently works as a hotel manager. She has three children and no grandchildren yet.           Problem list 1.  Nonobstructive CAD -Coronary calcium  score 69 (92nd percentile) -TPV 265 (32 calcified; 233 non-calcified; 10 low attenuation plaque; 79th percentile) -25-49% mLAD 2.  Hyperlipidemia -T chol 169, HDL 64, LDL 78, TG 132 -Lpa 128 3. PFO 4.  Raynaud's     ROS: All other ROS reviewed and negative. Pertinent positives noted in the HPI.     Studies Reviewed: SABRA   EKG Interpretation Date/Time:  Friday September 13 2024 16:04:54 EST Ventricular Rate:  91 PR Interval:  158 QRS Duration:  90 QT Interval:  382 QTC Calculation: 469 R Axis:   41  Text Interpretation: Normal sinus rhythm Nonspecific T wave abnormality Prolonged QT Confirmed by Decent Darryle [phone]) on 09/13/2024 4:16:09 PM   CCTA  05/17/2023 IMPRESSION: 1. Mild CAD in the mid LAD, second diagonal, and distal RCA, 25-49% stenosis, CADRADS 2.   2. Total plaque volume 265 mm3 which is 79th percentile for age- and sex-matched controls (calcified plaque 32 mm3; non-calcified plaque 233 mm3). TPV is severe.   3. Coronary calcium  score is 69, which places the patient in the 92nd percentile for age and sex matched control.   4. Normal coronary origins with right dominance.   5.  Mid ascending aorta is mildly dilated at 39 mm.   6.  Small patent foramen ovale with left to right shunt.  Physical Exam:   VS:  BP 112/82 (BP Location: Left Arm, Patient Position: Sitting, Cuff Size: Normal)   Pulse 91   Ht 5' 4 (1.626 m)   Wt 166 lb 12.8 oz (75.7 kg)   SpO2 99%   BMI 28.63 kg/m    Wt Readings from Last 3 Encounters:  09/13/24 166 lb 12.8 oz (75.7 kg)  09/12/24 166 lb 6.4 oz (75.5 kg)  08/30/24 169 lb 6.4 oz (76.8 kg)    GEN: Well nourished, well developed in no acute distress NECK: No JVD; No carotid bruits CARDIAC: RRR, no murmurs, rubs, gallops RESPIRATORY:  Clear to auscultation without rales, wheezing or rhonchi  ABDOMEN: Soft, non-tender, non-distended EXTREMITIES:  No edema; No deformity  ASSESSMENT AND PLAN: .   Assessment and Plan    Atherosclerotic  heart disease of native coronary artery without angina pectoris Nonobstructive coronary artery disease with mild LAD disease. Strong family history and elevated coronary calcium  score of 69 (92nd percentile). EKG normal. Blood pressure well controlled. - Continue aspirin  81 mg daily. - Continue annual follow-up.  Mixed hyperlipidemia Chronic hyperlipidemia with previous intolerance to Repatha  due to muscle pain. Currently on Lipitor 10 mg with LDL near goal. Plan to lower LDL further due to family history and coronary artery disease. Discussed potential future availability of pill form of Repatha  or similar medications. - Started Zetia 10 mg daily. -  Recheck fasting lipids in 6-8 weeks. - Follow up in 6-8 weeks for lipid panel.  Essential hypertension Hypertension well controlled on amlodipine  2.5 mg. - Continue current antihypertensive regimen.                Follow-up: Return in about 1 year (around 09/13/2025).  Signed, Darryle DASEN. Barbaraann, MD, [facility]  [facility]  [address] [place], [place] [address] [phone]  6:28 PM   "

## 2024-09-11 ENCOUNTER — Other Ambulatory Visit: Payer: Self-pay | Admitting: Gastroenterology

## 2024-09-11 DIAGNOSIS — R1310 Dysphagia, unspecified: Secondary | ICD-10-CM

## 2024-09-12 ENCOUNTER — Encounter: Payer: Self-pay | Admitting: Rheumatology

## 2024-09-12 ENCOUNTER — Telehealth: Payer: Self-pay

## 2024-09-12 ENCOUNTER — Ambulatory Visit: Attending: Rheumatology | Admitting: Rheumatology

## 2024-09-12 VITALS — BP 127/85 | HR 98 | Temp 97.6°F | Resp 15 | Ht 64.0 in | Wt 166.4 lb

## 2024-09-12 DIAGNOSIS — M5030 Other cervical disc degeneration, unspecified cervical region: Secondary | ICD-10-CM | POA: Diagnosis not present

## 2024-09-12 DIAGNOSIS — I7300 Raynaud's syndrome without gangrene: Secondary | ICD-10-CM

## 2024-09-12 DIAGNOSIS — M4802 Spinal stenosis, cervical region: Secondary | ICD-10-CM | POA: Diagnosis not present

## 2024-09-12 DIAGNOSIS — K219 Gastro-esophageal reflux disease without esophagitis: Secondary | ICD-10-CM

## 2024-09-12 DIAGNOSIS — L568 Other specified acute skin changes due to ultraviolet radiation: Secondary | ICD-10-CM

## 2024-09-12 DIAGNOSIS — M19041 Primary osteoarthritis, right hand: Secondary | ICD-10-CM | POA: Diagnosis not present

## 2024-09-12 DIAGNOSIS — R7689 Other specified abnormal immunological findings in serum: Secondary | ICD-10-CM

## 2024-09-12 DIAGNOSIS — G43709 Chronic migraine without aura, not intractable, without status migrainosus: Secondary | ICD-10-CM

## 2024-09-12 DIAGNOSIS — N3281 Overactive bladder: Secondary | ICD-10-CM

## 2024-09-12 DIAGNOSIS — M7061 Trochanteric bursitis, right hip: Secondary | ICD-10-CM

## 2024-09-12 DIAGNOSIS — E559 Vitamin D deficiency, unspecified: Secondary | ICD-10-CM

## 2024-09-12 DIAGNOSIS — Z8639 Personal history of other endocrine, nutritional and metabolic disease: Secondary | ICD-10-CM

## 2024-09-12 DIAGNOSIS — Z87442 Personal history of urinary calculi: Secondary | ICD-10-CM

## 2024-09-12 DIAGNOSIS — M222X1 Patellofemoral disorders, right knee: Secondary | ICD-10-CM

## 2024-09-12 DIAGNOSIS — Z87891 Personal history of nicotine dependence: Secondary | ICD-10-CM

## 2024-09-12 DIAGNOSIS — M222X2 Patellofemoral disorders, left knee: Secondary | ICD-10-CM

## 2024-09-12 DIAGNOSIS — L659 Nonscarring hair loss, unspecified: Secondary | ICD-10-CM | POA: Diagnosis not present

## 2024-09-12 DIAGNOSIS — Z820 Family history of epilepsy and other diseases of the nervous system: Secondary | ICD-10-CM

## 2024-09-12 DIAGNOSIS — M791 Myalgia, unspecified site: Secondary | ICD-10-CM

## 2024-09-12 DIAGNOSIS — I10 Essential (primary) hypertension: Secondary | ICD-10-CM

## 2024-09-12 DIAGNOSIS — Z975 Presence of (intrauterine) contraceptive device: Secondary | ICD-10-CM

## 2024-09-12 DIAGNOSIS — R5383 Other fatigue: Secondary | ICD-10-CM | POA: Diagnosis not present

## 2024-09-12 DIAGNOSIS — M722 Plantar fascial fibromatosis: Secondary | ICD-10-CM

## 2024-09-12 DIAGNOSIS — R682 Dry mouth, unspecified: Secondary | ICD-10-CM | POA: Diagnosis not present

## 2024-09-12 DIAGNOSIS — R140 Abdominal distension (gaseous): Secondary | ICD-10-CM

## 2024-09-12 DIAGNOSIS — M19042 Primary osteoarthritis, left hand: Secondary | ICD-10-CM

## 2024-09-12 DIAGNOSIS — M7062 Trochanteric bursitis, left hip: Secondary | ICD-10-CM

## 2024-09-12 NOTE — Telephone Encounter (Signed)
 [facility] called and needed patients most recent labs sent to dr kristie. I routed recent labs thru fax to them.

## 2024-09-12 NOTE — Patient Instructions (Signed)
 Hydroxychloroquine Tablets What is this medication? HYDROXYCHLOROQUINE (hye drox ee KLOR oh kwin) treats autoimmune conditions, such as rheumatoid arthritis and lupus. It works by slowing down an overactive immune system. It may also be used to prevent and treat malaria. It works by killing the parasite that causes malaria. It belongs to a group of medications called DMARDs. This medicine may be used for other purposes; ask your health care provider or pharmacist if you have questions. COMMON BRAND NAME(S): Plaquenil, Quineprox, SOVUNA What should I tell my care team before I take this medication? They need to know if you have any of these conditions: Diabetes Eye disease, vision problems Frequently drink alcohol G6PD deficiency Heart disease Irregular heartbeat or rhythm Kidney disease Liver disease Porphyria Psoriasis An unusual or allergic reaction to hydroxychloroquine, other medications, foods, dyes, or preservatives Pregnant or trying to get pregnant Breastfeeding How should I use this medication? Take this medication by mouth with water. Take it as directed on the prescription label. Do not cut, crush, or chew this medication. Swallow the tablets whole. Take it with food. Do not take it more than directed. Take all of this medication unless your care team tells you to stop it early. Keep taking it even if you think you are better. Take products with antacids in them at a different time of day than this medication. Take this medication 4 hours before or 4 hours after antacids. Talk to your care team if you have questions. Talk to your care team about the use of this medication in children. While this medication may be prescribed for selected conditions, precautions do apply. Overdosage: If you think you have taken too much of this medicine contact a poison control center or emergency room at once. NOTE: This medicine is only for you. Do not share this medicine with others. What if I  miss a dose? If you miss a dose, take it as soon as you can. If it is almost time for your next dose, take only that dose. Do not take double or extra doses. What may interact with this medication? Do not take this medication with any of the following: Cisapride Dronedarone Pimozide Thioridazine This medication may also interact with the following: Ampicillin Antacids Cimetidine Cyclosporine Digoxin Kaolin Medications for diabetes, such as insulin, glipizide, glyburide Medications for seizures, such as carbamazepine, phenobarbital, phenytoin Mefloquine Methotrexate Other medications that cause heart rhythm changes Praziquantel This list may not describe all possible interactions. Give your health care provider a list of all the medicines, herbs, non-prescription drugs, or dietary supplements you use. Also tell them if you smoke, drink alcohol, or use illegal drugs. Some items may interact with your medicine. What should I watch for while using this medication? Visit your care team for regular checks on your progress. Tell your care team if your symptoms do not start to get better or if they get worse. You may need blood work done while you are taking this medication. If you take other medications that can affect heart rhythm, you may need more testing. Talk to your care team if you have questions. Your vision may be tested before and during use of this medication. Tell your care team right away if you have any change in your eyesight. This medication may cause serious skin reactions. They can happen weeks to months after starting the medication. Contact your care team right away if you notice fevers or flu-like symptoms with a rash. The rash may be red or purple and then  turn into blisters or peeling of the skin. Or, you might notice a red rash with swelling of the face, lips or lymph nodes in your neck or under your arms. If you or your family notice any changes in your behavior, such as  new or worsening depression, thoughts of harming yourself, anxiety, or other unusual or disturbing thoughts, or memory loss, call your care team right away. What side effects may I notice from receiving this medication? Side effects that you should report to your care team as soon as possible: Allergic reactions--skin rash, itching, hives, swelling of the face, lips, tongue, or throat Aplastic anemia--unusual weakness or fatigue, dizziness, headache, trouble breathing, increased bleeding or bruising Change in vision Heart rhythm changes--fast or irregular heartbeat, dizziness, feeling faint or lightheaded, chest pain, trouble breathing Infection--fever, chills, cough, or sore throat Low blood sugar (hypoglycemia)--tremors or shaking, anxiety, sweating, cold or clammy skin, confusion, dizziness, rapid heartbeat Muscle injury--unusual weakness or fatigue, muscle pain, dark yellow or brown urine, decrease in amount of urine Pain, tingling, or numbness in the hands or feet Rash, fever, and swollen lymph nodes Redness, blistering, peeling, or loosening of the skin, including inside the mouth Thoughts of suicide or self-harm, worsening mood, or feelings of depression Unusual bruising or bleeding Side effects that usually do not require medical attention (report to your care team if they continue or are bothersome): Diarrhea Headache Nausea Stomach pain Vomiting This list may not describe all possible side effects. Call your doctor for medical advice about side effects. You may report side effects to FDA at 1-800-FDA-1088. Where should I keep my medication? Keep out of the reach of children and pets. Store at room temperature up to 30 degrees C (86 degrees F). Protect from light. Get rid of any unused medication after the expiration date. To get rid of medications that are no longer needed or have expired: Take the medication to a medication take-back program. Check with your pharmacy or law  enforcement to find a location. If you cannot return the medication, check the label or package insert to see if the medication should be thrown out in the garbage or flushed down the toilet. If you are not sure, ask your care team. If it is safe to put it in the trash, empty the medication out of the container. Mix the medication with cat litter, dirt, coffee grounds, or other unwanted substance. Seal the mixture in a bag or container. Put it in the trash. NOTE: This sheet is a summary. It may not cover all possible information. If you have questions about this medicine, talk to your doctor, pharmacist, or health care provider.  2024 [employer]vier/Gold Standard (2022-02-14 00:00:00)

## 2024-09-13 ENCOUNTER — Encounter: Payer: Self-pay | Admitting: Cardiovascular Disease

## 2024-09-13 ENCOUNTER — Ambulatory Visit: Attending: Cardiovascular Disease | Admitting: Cardiovascular Disease

## 2024-09-13 ENCOUNTER — Other Ambulatory Visit (HOSPITAL_COMMUNITY): Payer: Self-pay

## 2024-09-13 VITALS — BP 112/82 | HR 91 | Ht 64.0 in | Wt 166.8 lb

## 2024-09-13 DIAGNOSIS — I10 Essential (primary) hypertension: Secondary | ICD-10-CM | POA: Diagnosis not present

## 2024-09-13 DIAGNOSIS — I2510 Atherosclerotic heart disease of native coronary artery without angina pectoris: Secondary | ICD-10-CM | POA: Diagnosis not present

## 2024-09-13 DIAGNOSIS — E782 Mixed hyperlipidemia: Secondary | ICD-10-CM

## 2024-09-13 MED ORDER — EZETIMIBE 10 MG PO TABS
10.0000 mg | ORAL_TABLET | Freq: Every day | ORAL | 3 refills | Status: AC
  Filled 2024-09-13: qty 90, 90d supply, fill #0

## 2024-09-13 NOTE — Patient Instructions (Signed)
 Medication Instructions:  Your physician has recommended you make the following change in your medication:   -Start taking ezetimibe (zetia) 10mg  once daily.  *If you need a refill on your cardiac medications before your next appointment, please call your pharmacy*  Lab Work: Your physician recommends that you return for lab work in: 6-8 weeks for FASTING lipid/liver panel    If you have labs (blood work) drawn today and your tests are completely normal, you will receive your results only by: MyChart Message (if you have MyChart) OR A paper copy in the mail If you have any lab test that is abnormal or we need to change your treatment, we will call you to review the results.   Follow-Up: At [facility], you and your health needs are our priority.  As part of our continuing mission to provide you with exceptional heart care, our providers are all part of one team.  This team includes your primary Cardiologist (physician) and Advanced Practice Providers or APPs (Physician Assistants and Nurse Practitioners) who all work together to provide you with the care you need, when you need it.  Your next appointment:   12 month(s)  Provider:   Darryle ONEIDA Decent, MD or One of our Advanced Practice Providers (APPs): Morse Clause, PA-C  Lamarr Satterfield, NP Miriam Shams, NP  Olivia Pavy, PA-C Josefa Beauvais, NP  Leontine Salen, PA-C Orren Fabry, PA-C  [place], PA-C Ernest Dick, NP  Damien Braver, NP Jon Hails, PA-C  Waddell Donath, PA-C    Dayna Dunn, PA-C  Scott Weaver, PA-C Lum Louis, NP Katlyn West, NP Callie Goodrich, PA-C  Xika Zhao, NP Sheng Haley, PA-C    Kathleen Johnson, PA-C    We recommend signing up for the patient portal called MyChart.  Sign up information is provided on this After Visit Summary.  MyChart is used to connect with patients for Virtual Visits (Telemedicine).  Patients are able to view lab/test results, encounter notes, upcoming appointments, etc.   Non-urgent messages can be sent to your provider as well.   To learn more about what you can do with MyChart, go to [email or web address].   Other Instructions

## 2024-09-15 ENCOUNTER — Ambulatory Visit: Payer: Self-pay | Admitting: Rheumatology

## 2024-09-15 LAB — PROTEIN / CREATININE RATIO, URINE
Creatinine, Urine: 111 mg/dL (ref 20–275)
Protein/Creat Ratio: 63 mg/g{creat} (ref 24–184)
Protein/Creatinine Ratio: 0.063 mg/mg{creat} (ref 0.024–0.184)
Total Protein, Urine: 7 mg/dL (ref 5–24)

## 2024-09-15 LAB — C3 AND C4
C3 Complement: 149 mg/dL (ref 83–193)
C4 Complement: 29 mg/dL (ref 15–57)

## 2024-09-15 LAB — ANTI-SCLERODERMA ANTIBODY: Scleroderma (Scl-70) (ENA) Antibody, IgG: <1 AI

## 2024-09-15 LAB — ANTI-DNA ANTIBODY, DOUBLE-STRANDED: ds DNA Ab: <1 [IU]/mL

## 2024-09-15 LAB — ANTI-NUCLEAR AB-TITER (ANA TITER): ANA Titer 1: 1:40 {titer} — ABNORMAL HIGH

## 2024-09-15 LAB — GLIADIN ANTIBODIES, SERUM
Deamidated Gliadin Abs, IgG: <1 U/mL
Gliadin IgA: 2.2 U/mL

## 2024-09-15 LAB — ANA: Anti Nuclear Antibody (ANA): POSITIVE — AB

## 2024-09-15 LAB — ANTI-SMITH ANTIBODY: ENA SM Ab Ser-aCnc: <1 AI

## 2024-09-15 LAB — TISSUE TRANSGLUTAMINASE, IGA: (tTG) Ab, IgA: <1 U/mL

## 2024-09-15 LAB — SJOGRENS SYNDROME-B EXTRACTABLE NUCLEAR ANTIBODY: SSB (La) (ENA) Antibody, IgG: <1 AI

## 2024-09-15 LAB — RNP ANTIBODY: Ribonucleic Protein(ENA) Antibody, IgG: <1 AI

## 2024-09-15 LAB — SJOGRENS SYNDROME-A EXTRACTABLE NUCLEAR ANTIBODY: SSA (Ro) (ENA) Antibody, IgG: <1 AI

## 2024-09-15 NOTE — Progress Notes (Signed)
 Urine/ protein creatinine ratio is normal, (Scl-70, RNP,Sm,ds-DNA, SSA and SSB antibodies) are negative. ANA titer is pending.

## 2024-09-16 ENCOUNTER — Encounter: Payer: Self-pay | Admitting: Diagnostic Neuroimaging

## 2024-09-16 NOTE — Progress Notes (Signed)
 ANA titer is low and not significant.  All other labs are within normal limits.  Labs do not indicate an active autoimmune disease.  I called patient and discussed the results.  Please forward a copy of these labs to Dr. Kristie.

## 2024-09-17 ENCOUNTER — Ambulatory Visit: Admitting: Diagnostic Neuroimaging

## 2024-09-17 ENCOUNTER — Other Ambulatory Visit: Payer: Self-pay

## 2024-09-17 ENCOUNTER — Other Ambulatory Visit (HOSPITAL_COMMUNITY): Payer: Self-pay

## 2024-09-17 MED ORDER — OXYCODONE-ACETAMINOPHEN 5-325 MG PO TABS
1.0000 | ORAL_TABLET | Freq: Four times a day (QID) | ORAL | 0 refills | Status: AC | PRN
  Filled 2024-09-17: qty 110, 27d supply, fill #0
  Filled 2024-09-17: qty 2, 1d supply, fill #0

## 2024-09-20 ENCOUNTER — Other Ambulatory Visit (HOSPITAL_COMMUNITY): Payer: Self-pay

## 2024-09-20 ENCOUNTER — Other Ambulatory Visit: Payer: Self-pay | Admitting: Radiology

## 2024-09-20 ENCOUNTER — Other Ambulatory Visit: Payer: Self-pay

## 2024-09-20 ENCOUNTER — Other Ambulatory Visit: Payer: Self-pay | Admitting: Nurse Practitioner

## 2024-09-20 DIAGNOSIS — B009 Herpesviral infection, unspecified: Secondary | ICD-10-CM

## 2024-09-20 DIAGNOSIS — R41840 Attention and concentration deficit: Secondary | ICD-10-CM

## 2024-09-20 MED ORDER — ACYCLOVIR 5 % EX OINT
1.0000 | TOPICAL_OINTMENT | CUTANEOUS | 1 refills | Status: AC
  Filled 2024-09-20: qty 30, 30d supply, fill #0

## 2024-09-20 NOTE — Telephone Encounter (Signed)
.  Med refill request: Zovirax  5% Last AEX: 02/16/24 Next AEX: 02/19/25 Last MMG (if hormonal med) Refill authorized: Please Advise?   Verbal ok given by JOLYNN

## 2024-09-23 ENCOUNTER — Other Ambulatory Visit (HOSPITAL_COMMUNITY): Payer: Self-pay

## 2024-09-23 MED ORDER — BUPROPION HCL ER (XL) 150 MG PO TB24
150.0000 mg | ORAL_TABLET | Freq: Every day | ORAL | 0 refills | Status: AC
  Filled 2024-09-23: qty 90, 90d supply, fill #0

## 2024-10-10 ENCOUNTER — Ambulatory Visit: Admitting: Nurse Practitioner

## 2024-10-17 ENCOUNTER — Other Ambulatory Visit

## 2024-10-29 ENCOUNTER — Ambulatory Visit: Admitting: Family Medicine

## 2024-12-05 ENCOUNTER — Encounter

## 2024-12-05 ENCOUNTER — Ambulatory Visit (HOSPITAL_COMMUNITY)

## 2025-02-19 ENCOUNTER — Ambulatory Visit: Admitting: Radiology

## 2025-09-11 ENCOUNTER — Ambulatory Visit: Admitting: Rheumatology
